# Patient Record
Sex: Female | Born: 1945 | Race: White | Hispanic: No | Smoking: Never smoker
Health system: Southern US, Community
[De-identification: ages and names within clinical notes are randomized; demographics above are authoritative.]

## PROBLEM LIST (undated history)

## (undated) DIAGNOSIS — I482 Chronic atrial fibrillation, unspecified: Secondary | ICD-10-CM

## (undated) DIAGNOSIS — J45909 Unspecified asthma, uncomplicated: Secondary | ICD-10-CM

## (undated) DIAGNOSIS — E139 Other specified diabetes mellitus without complications: Secondary | ICD-10-CM

## (undated) DIAGNOSIS — T7840XA Allergy, unspecified, initial encounter: Secondary | ICD-10-CM

## (undated) DIAGNOSIS — I2510 Atherosclerotic heart disease of native coronary artery without angina pectoris: Secondary | ICD-10-CM

## (undated) DIAGNOSIS — I5042 Chronic combined systolic (congestive) and diastolic (congestive) heart failure: Secondary | ICD-10-CM

## (undated) DIAGNOSIS — K5792 Diverticulitis of intestine, part unspecified, without perforation or abscess without bleeding: Secondary | ICD-10-CM

## (undated) DIAGNOSIS — I639 Cerebral infarction, unspecified: Secondary | ICD-10-CM

## (undated) DIAGNOSIS — E119 Type 2 diabetes mellitus without complications: Secondary | ICD-10-CM

## (undated) DIAGNOSIS — I10 Essential (primary) hypertension: Secondary | ICD-10-CM

## (undated) DIAGNOSIS — E7800 Pure hypercholesterolemia, unspecified: Secondary | ICD-10-CM

## (undated) DIAGNOSIS — I4891 Unspecified atrial fibrillation: Secondary | ICD-10-CM

## (undated) DIAGNOSIS — Z955 Presence of coronary angioplasty implant and graft: Secondary | ICD-10-CM

## (undated) HISTORY — DX: Cerebral infarction, unspecified: I63.9

## (undated) HISTORY — PX: CARDIAC CATHETERIZATION: SHX172

## (undated) HISTORY — DX: Unspecified atrial fibrillation: I48.91

## (undated) HISTORY — DX: Type 2 diabetes mellitus without complications: E11.9

## (undated) HISTORY — PX: CORONARY ANGIOPLASTY: SHX604

## (undated) HISTORY — DX: Chronic combined systolic (congestive) and diastolic (congestive) heart failure: I50.42

## (undated) HISTORY — DX: Essential (primary) hypertension: I10

## (undated) HISTORY — DX: Diverticulitis of intestine, part unspecified, without perforation or abscess without bleeding: K57.92

## (undated) HISTORY — DX: Allergy, unspecified, initial encounter: T78.40XA

## (undated) HISTORY — DX: Pure hypercholesterolemia, unspecified: E78.00

## (undated) HISTORY — PX: ABDOMINAL HYSTERECTOMY: SHX81

## (undated) HISTORY — PX: CORONARY ARTERY BYPASS GRAFT: SHX141

---

## 2013-03-22 DIAGNOSIS — Z8619 Personal history of other infectious and parasitic diseases: Secondary | ICD-10-CM | POA: Insufficient documentation

## 2014-08-04 ENCOUNTER — Encounter: Payer: Self-pay | Admitting: Podiatry

## 2014-08-04 ENCOUNTER — Ambulatory Visit (INDEPENDENT_AMBULATORY_CARE_PROVIDER_SITE_OTHER): Payer: Medicare Other

## 2014-08-04 ENCOUNTER — Ambulatory Visit (INDEPENDENT_AMBULATORY_CARE_PROVIDER_SITE_OTHER): Payer: Medicare Other | Admitting: Podiatry

## 2014-08-04 VITALS — BP 188/91 | HR 73 | Resp 14 | Ht 62.0 in | Wt 168.0 lb

## 2014-08-04 DIAGNOSIS — M6789 Other specified disorders of synovium and tendon, multiple sites: Secondary | ICD-10-CM

## 2014-08-04 DIAGNOSIS — M79672 Pain in left foot: Secondary | ICD-10-CM

## 2014-08-04 DIAGNOSIS — M76829 Posterior tibial tendinitis, unspecified leg: Secondary | ICD-10-CM

## 2014-08-04 DIAGNOSIS — M76822 Posterior tibial tendinitis, left leg: Secondary | ICD-10-CM

## 2014-08-04 NOTE — Patient Instructions (Signed)
Posterior Tibial Tendon Tendinitis with Rehab Tendonitis is a condition that is characterized by inflammation of a tendon or the lining (sheath) that surrounds it. The inflammation is usually caused by damage to the tendon, such as a tendon tear (strain). Sprains are classified into three categories. Grade 1 sprains cause pain, but the tendon is not lengthened. Grade 2 sprains include a lengthened ligament due to the ligament being stretched or partially ruptured. With grade 2 sprains there is still function, although the function may be diminished. Grade 3 sprains are characterized by a complete tear of the tendon or muscle, and function is usually impaired. Posterior tibialis tendonitis is tendonitis of the posterior tibial tendon, which attaches muscles of the lower leg to the foot. The posterior tibial tendon is located in the back of the ankle and helps the body straighten (plantar flex) and rotate inward (medially rotate) the ankle. SYMPTOMS   Pain, tenderness, swelling, warmth, and/or redness over the back of the inner ankle at the posterior tibial tendon or the inner part of the mid-foot.  Pain that worsens with plantar flexion or medial rotation of the ankle.  A crackling sound (crepitation) when the tendon is moved or touched. CAUSES  Posterior tibial tendonitis occurs when damage to the posterior tibial tendon starts an inflammatory response. Common mechanisms of injury include:  Degenerative (occurs with aging) processes that weaken the tendon and make it more susceptible to injury.  Stress placed on the tendon from an increase in the intensity, frequency, or duration of training.  Direct trauma to the ankle.  Returning to activity before a previous ankle injury is allowed to heal. RISK INCREASES WITH:  Activities that involve repetitive and/or stressful plantar flexion (jumping, kicking, or running up/down hills).  Poor strength and flexibility.  Flat feet.  Previous injury  to the foot, ankle, or leg. PREVENTION   Warm up and stretch properly before activity.  Allow for adequate recovery between workouts.  Maintain physical fitness:  Strength, flexibility, and endurance.  Cardiovascular fitness.  Learn and use proper technique. When possible, have a coach correct improper technique.  Complete rehabilitation from a previous foot, ankle, or leg injury.  If you have flat feet, wear arch supports (orthotics). PROGNOSIS  If treated properly, the symptoms of tendonitis usually resolve within 6 weeks. This period may be shorter for injuries caused by direct trauma. RELATED COMPLICATIONS   Prolonged healing time, if improperly treated or reinjured.  Recurrent symptoms that result in a chronic problem.  Partial or complete tendon tear (rupture) requiring surgery. TREATMENT  Treatment initially involves the use of ice and medication to help reduce pain and inflammation. The use of strengthening and stretching exercises may help reduce pain with activity. These exercises may be performed at home or with referral to a therapist. Often times, your caregiver will recommend immobilizing the ankle to allow the tendon to heal. If you have flat feet, you may be advised to wear orthotic arch supports. If symptoms persist for greater than 6 months despite nonsurgical (conservative) treatment, then surgery may be recommended. MEDICATION   If pain medication is necessary, then nonsteroidal anti-inflammatory medications, such as aspirin and ibuprofen, or other minor pain relievers, such as acetaminophen, are often recommended.  Do not take pain medication for 7 days before surgery.  Prescription pain relievers may be given if deemed necessary by your caregiver. Use only as directed and only as much as you need.  Corticosteroid injections may be given by your caregiver. These injections should  be reserved for the most serious cases because they may only be given a certain  number of times. HEAT AND COLD  Cold treatment (icing) relieves pain and reduces inflammation. Cold treatment should be applied for 10 to 15 minutes every 2 to 3 hours for inflammation and pain and immediately after any activity that aggravates your symptoms. Use ice packs or massage the area with a piece of ice (ice massage).  Heat treatment may be used prior to performing the stretching and strengthening activities prescribed by your caregiver, physical therapist, or athletic trainer. Use a heat pack or soak the injury in warm water. SEEK MEDICAL CARE IF:  Treatment seems to offer no benefit, or the condition worsens.  Any medications produce adverse side effects. EXERCISES RANGE OF MOTION (ROM) AND STRETCHING EXERCISES - Posterior Tibial Tendon Tendinitis These exercises may help you when beginning to rehabilitate your injury. Your symptoms may resolve with or without further involvement from your physician, physical therapist or athletic trainer. While completing these exercises, remember:   Restoring tissue flexibility helps normal motion to return to the joints. This allows healthier, less painful movement and activity.  An effective stretch should be held for at least 30 seconds.  A stretch should never be painful. You should only feel a gentle lengthening or release in the stretched tissue. RANGE OF MOTION - Ankle Plantar Flexion   Sit with your right / left leg crossed over your opposite knee.  Use your opposite hand to pull the top of your foot and toes toward you.  You should feel a gentle stretch on the top of your foot/ankle. Hold this position for __________ seconds. Repeat __________ times. Complete this exercise __________ times per day.  RANGE OF MOTION - Ankle Eversion   Sit with your right / left ankle crossed over your opposite knee.  Grip your foot with your opposite hand, placing your thumb on the top of your foot and your fingers across the bottom of your  foot.  Gently push your foot downward with a slight rotation so your littlest toes rise slightly.  You should feel a gentle stretch on the inside of your ankle. Hold the stretch for __________ seconds. Repeat __________ times. Complete this exercise __________ times per day.  RANGE OF MOTION - Ankle Inversion   Sit with your right / left ankle crossed over your opposite knee.  Grip your foot with your opposite hand, placing your thumb on the bottom of your foot and your fingers across the top of your foot.  Gently pull your foot so the smallest toe comes toward you and your thumb pushes the inside of the ball of your foot away from you.  You should feel a gentle stretch on the outside of your ankle. Hold the stretch for __________ seconds. Repeat __________ times. Complete this exercise __________ times per day.  RANGE OF MOTION - Dorsi/Plantar Flexion  While sitting with your right / left knee straight, draw the top of your foot upward by flexing your ankle. Then reverse the motion, pointing your toes downward.  Hold each position for __________ seconds.  After completing your first set of exercises, repeat this exercise with your knee bent. Repeat __________ times. Complete this exercise __________ times per day.  RANGE OF MOTION - Ankle Alphabet  Imagine your right / left big toe is a pen.  Keeping your hip and knee still, write out the entire alphabet with your "pen." Make the letters as large as you can without   increasing any discomfort. Repeat __________ times. Complete this exercise __________ times per day.  STRETCH - Gastrocsoleus   Sit with your right / left leg extended. Holding onto both ends of a belt or towel, loop it around the ball of your foot.  Keeping your right / left ankle and foot relaxed and your knee straight, pull your foot and ankle toward you using the belt/towel.  You should feel a gentle stretch behind your calf or knee. Hold this position for  __________ seconds. Repeat __________ times. Complete this exercise __________ times per day.  STRETCH - Gastroc, Standing   Place hands on wall.  Extend right / left leg, keeping the front knee somewhat bent.  Slightly point your toes inward on your back foot.  Keeping your right / left heel on the floor and your knee straight, shift your weight toward the wall, not allowing your back to arch.  You should feel a gentle stretch in the right / left calf. Hold this position for __________ seconds. Repeat __________ times. Complete this stretch __________ times per day. STRETCH - Soleus, Standing   Place hands on wall.  Extend right / left leg, keeping the other knee somewhat bent.  Slightly point your toes inward on your back foot.  Keep your right / left heel on the floor, bend your back knee, and slightly shift your weight over the back leg so that you feel a gentle stretch deep in your back calf.  Hold this position for __________ seconds. Repeat __________ times. Complete this stretch __________ times per day. STRENGTHENING EXERCISES - Posterior Tibial Tendon Tendinitis These exercises may help you when beginning to rehabilitate your injury. They may resolve your symptoms with or without further involvement from your physician, physical therapist, or athletic trainer. While completing these exercises, remember:   Muscles can gain both the endurance and the strength needed for everyday activities through controlled exercises.  Complete these exercises as instructed by your physician, physical therapist, or athletic trainer. Progress the resistance and repetitions only as guided. STRENGTH - Dorsiflexors  Secure a rubber exercise band/tubing to a fixed object (i.e., table, pole) and loop the other end around your right / left foot.  Sit on the floor facing the fixed object. The band/tubing should be slightly tense when your foot is relaxed.  Slowly draw your foot back toward you  using your ankle and toes.  Hold this position for __________ seconds. Slowly release the tension in the band and return your foot to the starting position. Repeat __________ times. Complete this exercise __________ times per day.  STRENGTH - Towel Curls  Sit in a chair positioned on a non-carpeted surface.  Place your foot on a towel, keeping your heel on the floor.  Pull the towel toward your heel by only curling your toes. Keep your heel on the floor.  If instructed by your physician, physical therapist, or athletic trainer, add ____________________ at the end of the towel. Repeat __________ times. Complete this exercise __________ times per day. STRENGTH - Ankle Eversion   Secure one end of a rubber exercise band/tubing to a fixed object (table, pole). Loop the other end around your foot just before your toes.  Place your fists between your knees. This will focus your strengthening at your ankle.  Drawing the band/tubing across your opposite foot, slowly pull your little toe out and up. Make sure the band/tubing is positioned to resist the entire motion.  Hold this position for __________ seconds.  Have   your muscles resist the band/tubing as it slowly pulls your foot back to the starting position. Repeat __________ times. Complete this exercise __________ times per day.  STRENGTH - Ankle Inversion   Secure one end of a rubber exercise band/tubing to a fixed object (table, pole). Loop the other end around your foot just before your toes.  Place your fists between your knees. This will focus your strengthening at your ankle.  Slowly, pull your big toe up and in, making sure the band/tubing is positioned to resist the entire motion.  Hold this position for __________ seconds.  Have your muscles resist the band/tubing as it slowly pulls your foot back to the starting position. Repeat __________ times. Complete this exercises __________ times per day.  Document Released:  09/24/2005 Document Revised: 02/08/2014 Document Reviewed: 01/06/2009 [employer] Patient Information 2015 [employer]. This information is not intended to replace advice given to you by your health care provider. Make sure you discuss any questions you have with your health care provider.  

## 2014-08-04 NOTE — Progress Notes (Signed)
   Subjective:    Patient ID: Danielle Payne, female    DOB: 03-19-1946, 68 y.o.   MRN: [id number]  HPI Comments: Patient presents the office today with complaints of left ankle pain. She points to the medial aspect of her ankle. She states this area has been painful for approximately 3 months began while playing tennis. She previously has been giving Medrol Dosepak and using an over-the-counter ankle brace. She states that wearing the ankle brace in a high top tennis shoe helps alleviate some of her symptoms. She denies any specific injury or trauma to the area. Pain is worse after prolonged periods of activity or ambulation. No other complaints at this time.   Foot Injury       Review of Systems  All other systems reviewed and are negative.      Objective:   Physical Exam AAO x3, NAD DP/PT pulses palpable bilaterally, CRT less than 3 seconds Protective sensation intact with Simms Weinstein monofilament, vibratory sensation intact, Achilles tendon reflex intact Tenderness to palpation over the posterior tibial tendon behind the medial malleolus and along its course into the insertion into the navicular. There is a decrease in medial arch height upon weightbearing. Able to perform double heel rise although discomfort to the left side. Patient is unable to perform a single heel rise. There is mild edema over the medial aspect of the ankle. There is no pinpoint any tenderness or pain with vibratory sensation over the tibia/fibula or other areas of the foot. There is no overlying erythema or increased warmth. MMT 4/5, ROM WNL  No pain with range of motion of the ankle joint or subtalar joint..  No calf pain with compression, swelling, warmth, erythema No open lesions or pre-ulcerative lesions.       Assessment & Plan:  68 year old female with left foot posterior tibial tendon dysfunction/tendinitis. -X-rays were obtained and reviewed with the patient. -Conservative versus surgical  treatment discussed including alternatives, risks, complications. -This time recommend custom molded orthotics to help support her foot type. A prescription was given for the patient to go to [employer]otech to have them made. Also discussed supportive shoe gear. -Discussed the use of an ankle brace as needed. -Stretching/strengthening exercises discussed with the patient. -Ice to the affected area. -If symptoms persist we'll get a MRI. -Follow-up in one month or sooner if any problems are to arise or any change in symptoms. In the meantime call the office with any questions, concerns, change in symptoms.

## 2014-09-08 ENCOUNTER — Ambulatory Visit: Payer: Medicare Other | Admitting: Podiatry

## 2014-11-23 ENCOUNTER — Encounter (HOSPITAL_COMMUNITY): Payer: Self-pay

## 2014-11-23 ENCOUNTER — Observation Stay (HOSPITAL_COMMUNITY)
Admission: EM | Admit: 2014-11-23 | Discharge: 2014-11-23 | Disposition: A | Discharge status: Home / Self Care | Payer: Medicare Other | Attending: Internal Medicine | Admitting: Internal Medicine

## 2014-11-23 ENCOUNTER — Emergency Department (HOSPITAL_COMMUNITY): Payer: Medicare Other

## 2014-11-23 DIAGNOSIS — E785 Hyperlipidemia, unspecified: Secondary | ICD-10-CM

## 2014-11-23 DIAGNOSIS — I481 Persistent atrial fibrillation: Principal | ICD-10-CM | POA: Insufficient documentation

## 2014-11-23 DIAGNOSIS — Z7982 Long term (current) use of aspirin: Secondary | ICD-10-CM | POA: Diagnosis not present

## 2014-11-23 DIAGNOSIS — R002 Palpitations: Secondary | ICD-10-CM

## 2014-11-23 DIAGNOSIS — I4819 Other persistent atrial fibrillation: Secondary | ICD-10-CM

## 2014-11-23 DIAGNOSIS — M546 Pain in thoracic spine: Secondary | ICD-10-CM

## 2014-11-23 DIAGNOSIS — I2510 Atherosclerotic heart disease of native coronary artery without angina pectoris: Secondary | ICD-10-CM

## 2014-11-23 DIAGNOSIS — I4891 Unspecified atrial fibrillation: Secondary | ICD-10-CM | POA: Diagnosis present

## 2014-11-23 DIAGNOSIS — Z79899 Other long term (current) drug therapy: Secondary | ICD-10-CM | POA: Diagnosis not present

## 2014-11-23 DIAGNOSIS — I10 Essential (primary) hypertension: Secondary | ICD-10-CM | POA: Insufficient documentation

## 2014-11-23 DIAGNOSIS — M549 Dorsalgia, unspecified: Secondary | ICD-10-CM | POA: Insufficient documentation

## 2014-11-23 DIAGNOSIS — Z955 Presence of coronary angioplasty implant and graft: Secondary | ICD-10-CM | POA: Diagnosis not present

## 2014-11-23 DIAGNOSIS — I482 Chronic atrial fibrillation: Secondary | ICD-10-CM

## 2014-11-23 DIAGNOSIS — I252 Old myocardial infarction: Secondary | ICD-10-CM | POA: Diagnosis not present

## 2014-11-23 DIAGNOSIS — R079 Chest pain, unspecified: Secondary | ICD-10-CM

## 2014-11-23 DIAGNOSIS — J45909 Unspecified asthma, uncomplicated: Secondary | ICD-10-CM | POA: Diagnosis present

## 2014-11-23 HISTORY — DX: Presence of coronary angioplasty implant and graft: Z95.5

## 2014-11-23 HISTORY — DX: Unspecified asthma, uncomplicated: J45.909

## 2014-11-23 HISTORY — DX: Atherosclerotic heart disease of native coronary artery without angina pectoris: I25.10

## 2014-11-23 LAB — CBC WITH DIFFERENTIAL/PLATELET
Basophils Absolute: 0.1 10*3/uL (ref 0.0–0.1)
Basophils Absolute: 0.1 10*3/uL (ref 0.0–0.1)
Basophils Relative: 1 % (ref 0–1)
Basophils Relative: 1 % (ref 0–1)
EOS ABS: 0.3 10*3/uL (ref 0.0–0.7)
Eosinophils Absolute: 0.3 10*3/uL (ref 0.0–0.7)
Eosinophils Relative: 3 % (ref 0–5)
Eosinophils Relative: 3 % (ref 0–5)
HCT: 41.7 % (ref 36.0–46.0)
HCT: 41 % (ref 36.0–46.0)
HEMOGLOBIN: 13.7 g/dL (ref 12.0–15.0)
HEMOGLOBIN: 13.5 g/dL (ref 12.0–15.0)
LYMPHS ABS: 4.1 10*3/uL — AB (ref 0.7–4.0)
LYMPHS ABS: 3.8 10*3/uL (ref 0.7–4.0)
LYMPHS PCT: 42 % (ref 12–46)
Lymphocytes Relative: 45 % (ref 12–46)
MCH: 29.3 pg (ref 26.0–34.0)
MCH: 29.3 pg (ref 26.0–34.0)
MCHC: 32.9 g/dL (ref 30.0–36.0)
MCHC: 32.9 g/dL (ref 30.0–36.0)
MCV: 89.1 fL (ref 78.0–100.0)
MCV: 89.1 fL (ref 78.0–100.0)
MONO ABS: 0.7 10*3/uL (ref 0.1–1.0)
Monocytes Absolute: 0.7 10*3/uL (ref 0.1–1.0)
Monocytes Relative: 7 % (ref 3–12)
Monocytes Relative: 8 % (ref 3–12)
NEUTROS ABS: 4.1 10*3/uL (ref 1.7–7.7)
NEUTROS PCT: 44 % (ref 43–77)
Neutro Abs: 4.2 10*3/uL (ref 1.7–7.7)
Neutrophils Relative %: 46 % (ref 43–77)
PLATELETS: 260 10*3/uL (ref 150–400)
Platelets: 273 10*3/uL (ref 150–400)
RBC: 4.68 MIL/uL (ref 3.87–5.11)
RBC: 4.6 MIL/uL (ref 3.87–5.11)
RDW: 13.5 % (ref 11.5–15.5)
RDW: 13.5 % (ref 11.5–15.5)
WBC: 9.2 10*3/uL (ref 4.0–10.5)
WBC: 9.1 10*3/uL (ref 4.0–10.5)

## 2014-11-23 LAB — I-STAT CHEM 8, ED
BUN: 21 mg/dL (ref 6–23)
CHLORIDE: 107 mmol/L (ref 96–112)
Calcium, Ion: 1.15 mmol/L (ref 1.13–1.30)
Creatinine, Ser: 0.8 mg/dL (ref 0.50–1.10)
GLUCOSE: 122 mg/dL — AB (ref 70–99)
HCT: 43 % (ref 36.0–46.0)
Hemoglobin: 14.6 g/dL (ref 12.0–15.0)
Potassium: 4 mmol/L (ref 3.5–5.1)
Sodium: 141 mmol/L (ref 135–145)
TCO2: 19 mmol/L (ref 0–100)

## 2014-11-23 LAB — I-STAT TROPONIN, ED: TROPONIN I, POC: 0 ng/mL (ref 0.00–0.08)

## 2014-11-23 LAB — COMPREHENSIVE METABOLIC PANEL
ALK PHOS: 71 U/L (ref 39–117)
ALT: 21 U/L (ref 0–35)
AST: 19 U/L (ref 0–37)
Albumin: 4 g/dL (ref 3.5–5.2)
Anion gap: 7 (ref 5–15)
BILIRUBIN TOTAL: 0.5 mg/dL (ref 0.3–1.2)
BUN: 18 mg/dL (ref 6–23)
CO2: 24 mmol/L (ref 19–32)
Calcium: 9.1 mg/dL (ref 8.4–10.5)
Chloride: 108 mmol/L (ref 96–112)
Creatinine, Ser: 0.8 mg/dL (ref 0.50–1.10)
GFR calc non Af Amer: 73 mL/min — ABNORMAL LOW (ref >90)
GFR, EST AFRICAN AMERICAN: 85 mL/min — AB (ref >90)
GLUCOSE: 120 mg/dL — AB (ref 70–99)
POTASSIUM: 4.1 mmol/L (ref 3.5–5.1)
SODIUM: 139 mmol/L (ref 135–145)
Total Protein: 6.8 g/dL (ref 6.0–8.3)

## 2014-11-23 LAB — TROPONIN I: Troponin I: <0.03 ng/mL (ref <0.031)

## 2014-11-23 LAB — T4, FREE: Free T4: 0.98 ng/dL (ref 0.80–1.80)

## 2014-11-23 LAB — GLUCOSE, CAPILLARY: Glucose-Capillary: 132 mg/dL — ABNORMAL HIGH (ref 70–99)

## 2014-11-23 LAB — MAGNESIUM: Magnesium: 2.2 mg/dL (ref 1.5–2.5)

## 2014-11-23 LAB — TSH: TSH: 0.792 u[IU]/mL (ref 0.350–4.500)

## 2014-11-23 MED ORDER — LEVALBUTEROL HCL 0.63 MG/3ML IN NEBU: 0.6300 mg | INHALATION_SOLUTION | Freq: Four times a day (QID) | RESPIRATORY_TRACT | Status: DC | PRN

## 2014-11-23 MED ORDER — SODIUM CHLORIDE 0.9 % IV BOLUS (SEPSIS)
1000.0000 mL | Freq: Once | INTRAVENOUS | Status: AC
  Administered 2014-11-23: 1000 mL via INTRAVENOUS

## 2014-11-23 MED ORDER — RIVAROXABAN 20 MG PO TABS: 20.0000 mg | ORAL_TABLET | Freq: Every day | ORAL | Status: DC

## 2014-11-23 MED ORDER — OMEGA-3-ACID ETHYL ESTERS 1 G PO CAPS
1.0000 g | ORAL_CAPSULE | Freq: Every day | ORAL | Status: DC
Start: 2014-11-24 — End: 2014-11-23

## 2014-11-23 MED ORDER — AMLODIPINE BESYLATE 10 MG PO TABS: 10.0000 mg | ORAL_TABLET | Freq: Every day | ORAL | Status: DC

## 2014-11-23 MED ORDER — HEPARIN BOLUS VIA INFUSION
2000.0000 [IU] | INTRAVENOUS | Status: AC
  Administered 2014-11-23: 2000 [IU] via INTRAVENOUS
  Filled 2014-11-23: qty 2000

## 2014-11-23 MED ORDER — SODIUM CHLORIDE 0.9 % IJ SOLN: 3.0000 mL | Freq: Two times a day (BID) | INTRAMUSCULAR | Status: DC

## 2014-11-23 MED ORDER — RIVAROXABAN 20 MG PO TABS
20.0000 mg | ORAL_TABLET | Freq: Every day | ORAL | Status: DC
  Administered 2014-11-23: 20 mg via ORAL
  Filled 2014-11-23: qty 1

## 2014-11-23 MED ORDER — ACETAMINOPHEN 325 MG PO TABS: 650.0000 mg | ORAL_TABLET | ORAL | Status: DC | PRN

## 2014-11-23 MED ORDER — ASPIRIN 325 MG PO TABS: 325.0000 mg | ORAL_TABLET | Freq: Every day | ORAL | Status: DC

## 2014-11-23 MED ORDER — HEPARIN (PORCINE) IN NACL 100-0.45 UNIT/ML-% IJ SOLN
1100.0000 [IU]/h | INTRAMUSCULAR | Status: DC
  Administered 2014-11-23: 1100 [IU]/h via INTRAVENOUS
  Filled 2014-11-23: qty 250

## 2014-11-23 MED ORDER — FOLIC ACID 1 MG PO TABS: 1.0000 mg | ORAL_TABLET | Freq: Every day | ORAL | Status: DC

## 2014-11-23 MED ORDER — ONDANSETRON HCL 4 MG/2ML IJ SOLN: 4.0000 mg | Freq: Four times a day (QID) | INTRAMUSCULAR | Status: DC | PRN

## 2014-11-23 MED ORDER — ASPIRIN 81 MG PO CHEW
81.0000 mg | CHEWABLE_TABLET | Freq: Every day | ORAL | Status: DC
  Administered 2014-11-23: 81 mg via ORAL
  Filled 2014-11-23: qty 1

## 2014-11-23 MED ORDER — LISINOPRIL 40 MG PO TABS
40.0000 mg | ORAL_TABLET | Freq: Every evening | ORAL | Status: DC
  Filled 2014-11-23: qty 1

## 2014-11-23 MED ORDER — RIVAROXABAN (XARELTO) VTE STARTER PACK (15 & 20 MG): ORAL_TABLET | ORAL | Status: DC

## 2014-11-23 MED ORDER — METOPROLOL TARTRATE 25 MG PO TABS
25.0000 mg | ORAL_TABLET | Freq: Two times a day (BID) | ORAL | Status: DC
  Administered 2014-11-23: 25 mg via ORAL
  Filled 2014-11-23: qty 1

## 2014-11-23 MED ORDER — LEVALBUTEROL TARTRATE 45 MCG/ACT IN AERO: 2.0000 | INHALATION_SPRAY | Freq: Four times a day (QID) | RESPIRATORY_TRACT | Status: DC | PRN

## 2014-11-23 NOTE — ED Notes (Signed)
Hospitalist at bedside. Pt ready for transport.

## 2014-11-23 NOTE — ED Notes (Signed)
Repeat EKG per EDP [facility]

## 2014-11-23 NOTE — Progress Notes (Signed)
ANTICOAGULATION CONSULT NOTE - Initial Consult  Pharmacy Consult for Heparin Indication: Atrial Fibrillation  No Known Allergies  Patient Measurements: Height: 5\' 3"  (160 cm) Weight: 178 lb 5.6 oz (80.9 kg) IBW/kg (Calculated) : 52.4   Vital Signs: Temp: 97.6 F (36.4 C) (02/16 0617) Temp Source: Oral (02/16 0617) BP: 135/79 mmHg (02/16 0617) Pulse Rate: 72 (02/16 0617)  Labs:  Recent Labs  11/23/14 0500 11/23/14 0512  HGB 13.7 14.6  HCT 41.7 43.0  PLT 273  --   CREATININE  --  0.80    Estimated Creatinine Clearance: 65.9 mL/min (by C-G formula based on Cr of 0.8).   Medical History: Past Medical History  Diagnosis Date  . Hypertension   . Allergy   . Stented coronary artery   . Coronary artery disease   . Asthma     Assessment: 69 y/o F presented to ED with worsening chest pressure and palpitations, was found to be in atrial fibrillation with controlled ventricular rate.  EKG shows ST depression in the inferior leads but initial troponin is negative.  Orders received to begin IV heparin with pharmacy dosing assistance. Baseline PT and aPTT are pending.  Patient on ASA prior to admission for CAD and stent but not on any anticoagulants.   Goal of Therapy:  Heparin level 0.3-0.7 units/ml Monitor platelets by anticoagulation protocol: Yes   Plan: . 1. Heparin 2000 units IV bolus x 1 2. Heparin 1100 units/hr IV infusion 3. Heparin level today at 2pm 4. Heparin level and CBC daily 5. Follow clinical course, monitor for reports of bleeding.   Clayburn Pert, PharmD, BCPS Pager: [phone] 11/23/2014  6:50 AM

## 2014-11-23 NOTE — ED Provider Notes (Signed)
CSN: [id number]     Arrival date & time 11/23/14  0434 History   First MD Initiated Contact with Patient 11/23/14 [phone]     Chief Complaint  Patient presents with  . Irregular Heart Beat     (Consider location/radiation/quality/duration/timing/severity/associated sxs/prior Treatment) HPI  Danielle Payne is a 69 y.o. female with past medical history of hypertension, COPD, coronary artery disease status post stent placement here with chest pressure and palpitations. Patient states this all began the day of the [place] Bowl. She denies any alcohol use. Her symptoms progressively worsened until today around 4 AM the patient woke up with palpitations and chest pressure. She took amlodipine and metoprolol, her home medications without any relief. She also took a nitroglycerin which did not help. Patient normally takes at 325 mg aspirin per day. He does have a history of atrial fibrillation many years ago that was electrical cardioverted. Patient states she was short of breath as well. She denies emesis or diaphoresis. Patient is still currently having chest pressure in the room.  10 Systems reviewed and are negative for acute change except as noted in the HPI.    Past Medical History  Diagnosis Date  . Hypertension   . COPD (chronic obstructive pulmonary disease)   . Allergy   . Stented coronary artery    Past Surgical History  Procedure Laterality Date  . Abdominal hysterectomy     History reviewed. No pertinent family history. History  Substance Use Topics  . Smoking status: Never Smoker   . Smokeless tobacco: Not on file  . Alcohol Use: No   OB History    No data available     Review of Systems    Allergies  Review of patient's allergies indicates no known allergies.  Home Medications   Prior to Admission medications   Medication Sig Start Date End Date Taking? Authorizing Provider  albuterol (PROVENTIL HFA;VENTOLIN HFA) 108 (90 BASE) MCG/ACT inhaler Inhale 2 puffs into  the lungs every 4 (four) hours as needed for wheezing or shortness of breath.   Yes Historical Provider, MD  amLODipine (NORVASC) 10 MG tablet Take 10 mg by mouth daily.   Yes Historical Provider, MD  aspirin 325 MG tablet Take 325 mg by mouth at bedtime.    Yes Historical Provider, MD  folic acid (FOLVITE) 1 MG tablet Take 1 mg by mouth at bedtime.   Yes Historical Provider, MD  lisinopril (PRINIVIL,ZESTRIL) 40 MG tablet Take 40 mg by mouth every evening.    Yes Historical Provider, MD  metoprolol tartrate (LOPRESSOR) 25 MG tablet Take 25 mg by mouth 2 (two) times daily.   Yes Historical Provider, MD  Omega-3 Fatty Acids (FISH OIL PO) Take 1 capsule by mouth daily.   Yes Historical Provider, MD  OVER THE COUNTER MEDICATION Take 1 capsule by mouth at bedtime. Sleep Essentials   Yes Historical Provider, MD  OVER THE COUNTER MEDICATION Take 5 mLs by mouth at bedtime. Valerian Root Liquid   Yes Historical Provider, MD   BP 150/80 mmHg  Pulse 77  Resp 14  SpO2 97% Physical Exam  Constitutional: She is oriented to person, place, and time. She appears well-developed and well-nourished. No distress.  HENT:  Head: Normocephalic and atraumatic.  Nose: Nose normal.  Mouth/Throat: Oropharynx is clear and moist. No oropharyngeal exudate.  Eyes: Conjunctivae and EOM are normal. Pupils are equal, round, and reactive to light. No scleral icterus.  Neck: Normal range of motion. Neck supple. No JVD present. No  tracheal deviation present. No thyromegaly present.  Cardiovascular: Normal rate and normal heart sounds.  Exam reveals no gallop and no friction rub.   No murmur heard. Irregularly irregular rhythm  Pulmonary/Chest: Effort normal and breath sounds normal. No respiratory distress. She has no wheezes. She exhibits no tenderness.  Abdominal: Soft. Bowel sounds are normal. She exhibits no distension and no mass. There is no tenderness. There is no rebound and no guarding.  Musculoskeletal: Normal range  of motion. She exhibits no edema or tenderness.  Lymphadenopathy:    She has no cervical adenopathy.  Neurological: She is alert and oriented to person, place, and time. No cranial nerve deficit. She exhibits normal muscle tone.  Skin: Skin is warm and dry. No rash noted. She is not diaphoretic. No erythema. No pallor.  Nursing note and vitals reviewed.   ED Course  Procedures (including critical care time) Labs Review Labs Reviewed  CBC WITH DIFFERENTIAL/PLATELET - Abnormal; Notable for the following:    Lymphs Abs 4.1 (*)    All other components within normal limits  I-STAT CHEM 8, ED - Abnormal; Notable for the following:    Glucose, Bld 122 (*)    All other components within normal limits  MAGNESIUM  I-STAT TROPOININ, ED    Imaging Review Dg Chest 2 View  11/23/2014   CLINICAL DATA:  Irregular heartbeat, several days duration. Left-sided chest discomfort.  EXAM: CHEST  2 VIEW  COMPARISON:  03/20/2011  FINDINGS: The lungs are clear. There are no effusions. Pulmonary vasculature is normal. Hilar, mediastinal and cardiac contours appear unremarkable.No significant skeletal abnormalities are evident.  IMPRESSION: No active cardiopulmonary disease.   Electronically Signed   By: Andreas Newport M.D.   On: 11/23/2014 05:24     EKG Interpretation   Date/Time:  Tuesday November 23 2014 05:02:16 EST Ventricular Rate:  65 PR Interval:    QRS Duration: 78 QT Interval:  382 QTC Calculation: 397 R Axis:   18 Text Interpretation:  Atrial fibrillation No significant change since last  tracing Confirmed by Glynn Octave [phone]) on 11/23/2014 5:06:08 AM      MDM   Final diagnoses:  Chest pain    Patient presents emergency department for chest pain and palpitations. EKG confirms atrial fibrillation. She has ST depressions in the inferior leads as well concerning for ischemia. Troponin is negative. Patient was admitted to telemetry for continued management. It appears her  onset was during the [place] Bowl that she is out of the window for cardioversion as is for greater than 2 days. CHADS2-VASC score is 3.  . Patient was admitted to [facility] hospitalist telemetry unit.    Everlene Balls, MD 11/23/14 [phone]

## 2014-11-23 NOTE — Progress Notes (Addendum)
ANTICOAGULATION CONSULT NOTE - follow-up  Pharmacy Consult for Heparin to rivaroxaban Indication: Atrial Fibrillation  No Known Allergies  Patient Measurements: Height: 5\' 3"  (160 cm) Weight: 178 lb 5.6 oz (80.9 kg) IBW/kg (Calculated) : 52.4   Vital Signs: Temp: 97.6 F (36.4 C) (02/16 0617) Temp Source: Oral (02/16 0617) BP: 135/79 mmHg (02/16 0617) Pulse Rate: 72 (02/16 0617)  Labs:  Recent Labs  11/23/14 0500 11/23/14 0512 11/23/14 0650  HGB 13.7 14.6 13.5  HCT 41.7 43.0 41.0  PLT 273  --  260  CREATININE  --  0.80 0.80  TROPONINI  --   --  <0.03    Estimated Creatinine Clearance: 65.9 mL/min (by C-G formula based on Cr of 0.8).   Medical History: Past Medical History  Diagnosis Date  . Hypertension   . Allergy   . Stented coronary artery   . Coronary artery disease   . Asthma     Assessment: 69 y/o F presented to ED with worsening chest pressure and palpitations, was found to be in atrial fibrillation with controlled ventricular rate.  EKG shows ST depression in the inferior leads but initial troponin is negative.  Orders received to begin IV heparin with pharmacy dosing assistance. Cardiology saw patient 2/16 and orders to change to rivaroxaban. CHA2DS2-VASCScore: Risk Score =  4, AC Has Bled score = 2   Baseline PT and aPTT ordered STAT 2/16 but never collected.  Patient on ASA prior to admission for CAD and stent but not on any anticoagulants.   Goal of Therapy:  Dose rivaroxaban for indication and renal function Monitor platelets by anticoagulation protocol: Yes   Plan: .  Change heparin to rivaroxaban for afib  D/C heparin labs  Start rivaroxaban 20mg  daily w/ food, give 1st dose at noon and d/c heparin gtt simultaneously  Suggest reduce ASA to 81mg  daily - cardiology note states reduce dose but not ordered  Doreene Eland, PharmD, BCPS.   Pager: [phone] 11/23/2014  10:30 AM  Addendum:  Educated patient on medication and signs of  stroke.  Card provided for a free 30-day supply of medication and to call [phone] if further medication assistance is needed. She stated she does not have medication coverage.   Doreene Eland, PharmD, BCPS.   Pager: [phone] 11/23/2014 2:30 PM

## 2014-11-23 NOTE — H&P (Signed)
[facility] History and Physical  Turner Kunzman ZOX:[id number] DOB: 1946-02-26 DOA: 11/23/2014  Referring physician: ER physician. PCP: No PCP Per Patient   Chief Complaint: Palpitation and left upper back pain.  HPI: Danielle Payne is a 69 y.o. female history of hypertension, asthma, atrial fibrillation, CAD status post stenting presents to the ER because of palpitations and left upper back pain. Patient states that patient woke up in the middle of the night because of palpitations and patient took her metoprolol and nitroglycerin. Patient called EMS and patient came to the ER by the patient's palpitation has resolved but he usually atrial fibrillation with controlled rate. Patient states she has had atrial fibrillation 3 years ago and at that time had cardioversion done and has been placed on aspirin and was not on any anticoagulants. In addition patient has been having some left upper back pain for the last 2 days. Patient states she also has been using the computer more than usual. In the EKG was showing some subtle inferior leads depression. Cardiac markers remain negative patient's left upper back pain has been constant and has no relation to exertion or breathing and that has been no associated productive cough fever chills or any dysuria discharges. On exam patient is not in distress and is no left flank tenderness. Patient's left upper back pain is not appreciable on palpation.     Review of Systems: As presented in the history of presenting illness, rest negative.  Past Medical History  Diagnosis Date  . Hypertension   . Allergy   . Stented coronary artery   . Coronary artery disease   . Asthma    Past Surgical History  Procedure Laterality Date  . Abdominal hysterectomy    . Cardiac catheterization    . Coronary angioplasty     Social History:  reports that she has never smoked. She does not have any smokeless tobacco history on file. She reports that she does not  drink alcohol. Her drug history is not on file. Where does patient live home.  Can patient participate in ADLs? Yes.  No Known Allergies  Family History:  Family History  Problem Relation Age of Onset  . CAD Mother   . Prostate cancer Father   . Diabetes Mellitus II Sister   . Diabetes Mellitus II Brother       Prior to Admission medications   Medication Sig Start Date End Date Taking? Authorizing Provider  albuterol (PROVENTIL HFA;VENTOLIN HFA) 108 (90 BASE) MCG/ACT inhaler Inhale 2 puffs into the lungs every 4 (four) hours as needed for wheezing or shortness of breath.   Yes Historical Provider, MD  amLODipine (NORVASC) 10 MG tablet Take 10 mg by mouth daily.   Yes Historical Provider, MD  aspirin 325 MG tablet Take 325 mg by mouth at bedtime.    Yes Historical Provider, MD  folic acid (FOLVITE) 1 MG tablet Take 1 mg by mouth at bedtime.   Yes Historical Provider, MD  lisinopril (PRINIVIL,ZESTRIL) 40 MG tablet Take 40 mg by mouth every evening.    Yes Historical Provider, MD  metoprolol tartrate (LOPRESSOR) 25 MG tablet Take 25 mg by mouth 2 (two) times daily.   Yes Historical Provider, MD  Omega-3 Fatty Acids (FISH OIL PO) Take 1 capsule by mouth daily.   Yes Historical Provider, MD  OVER THE COUNTER MEDICATION Take 1 capsule by mouth at bedtime. Sleep Essentials   Yes Historical Provider, MD  OVER THE COUNTER MEDICATION Take 5 mLs by mouth  at bedtime. Valerian Root Liquid   Yes Historical Provider, MD    Physical Exam: Filed Vitals:   11/23/14 0445 11/23/14 0617  BP: 150/80 135/79  Pulse: 77 72  Temp:  97.6 F (36.4 C)  TempSrc:  Oral  Resp: 14 14  Height:  5\' 3"  (1.6 m)  Weight:  80.9 kg (178 lb 5.6 oz)  SpO2: 97% 98%     General:  Well-developed and nourished.  Eyes: Anicteric no pallor.  ENT: No discharge from the ears eyes nose or mouth.  Neck: No mass felt.  Cardiovascular: S1 and S2 heard.  Respiratory: No rhonchi or crepitations.  Abdomen: Soft  nontender bowel sounds present.  Skin: No rash.  Musculoskeletal: No edema.  Psychiatric: Appears normal.  Neurologic: Alert awake oriented to time place and person. Moves all extremities.  Labs on Admission:  Basic Metabolic Panel:  Recent Labs Lab 11/23/14 0500 11/23/14 0512  NA  --  141  K  --  4.0  CL  --  107  GLUCOSE  --  122*  BUN  --  21  CREATININE  --  0.80  MG 2.2  --    Liver Function Tests: No results for input(s): AST, ALT, ALKPHOS, BILITOT, PROT, ALBUMIN in the last 168 hours. No results for input(s): LIPASE, AMYLASE in the last 168 hours. No results for input(s): AMMONIA in the last 168 hours. CBC:  Recent Labs Lab 11/23/14 0500 11/23/14 0512  WBC 9.2  --   NEUTROABS 4.1  --   HGB 13.7 14.6  HCT 41.7 43.0  MCV 89.1  --   PLT 273  --    Cardiac Enzymes: No results for input(s): CKTOTAL, CKMB, CKMBINDEX, TROPONINI in the last 168 hours.  BNP (last 3 results) No results for input(s): BNP in the last 8760 hours.  ProBNP (last 3 results) No results for input(s): PROBNP in the last 8760 hours.  CBG: No results for input(s): GLUCAP in the last 168 hours.  Radiological Exams on Admission: Dg Chest 2 View  11/23/2014   CLINICAL DATA:  Irregular heartbeat, several days duration. Left-sided chest discomfort.  EXAM: CHEST  2 VIEW  COMPARISON:  03/20/2011  FINDINGS: The lungs are clear. There are no effusions. Pulmonary vasculature is normal. Hilar, mediastinal and cardiac contours appear unremarkable.No significant skeletal abnormalities are evident.  IMPRESSION: No active cardiopulmonary disease.   Electronically Signed   By: Andreas Newport M.D.   On: 11/23/2014 05:24    EKG: Independently reviewed. Atrial fibrillation controlled rate with inferior leads chest and depression.  Assessment/Plan Principal Problem:   Palpitations Active Problems:   Atrial fibrillation   Upper back pain   Essential hypertension   Asthma, chronic   Persistent  atrial fibrillation   1. Palpitation with history of atrial fibrillation - presently rate controlled and patient is in A. fib. Patient's CHADS2VASC score is 4. Patient will be placed on heparin infusion for now and continue on her metoprolol. Check thyroid function test 2-D echo. Cycle cardiac markers. 2. Left upper back pain with history of CAD status post stenting - pain appears atypical but patient has some subtle EKG changes. We will cycle cardiac markers check 2-D echo. I have discussed with Dr. Einar Gip patient's cardiologist will be seeing patient in consult. Patient will be kept nothing by mouth except medications. Aspirin. Patient is already on metoprolol. 3. Hypertension - continue present home medications. 4. History of asthma - when necessary Xopenex. Presently not wheezing.   DVT Prophylaxis  on heparin infusion.  Code Status: Full code.  Family Communication: Patient's husband.  Disposition Plan: Admit to inpatient.    Millie Shorb N. [facility] Pager [phone].  If 7PM-7AM, please contact night-coverage [email or web address] Password [email or web address] 11/23/2014, 6:20 AM

## 2014-11-23 NOTE — Care Management Note (Addendum)
    Page 1 of 1   11/23/2014     3:17:27 PM CARE MANAGEMENT NOTE 11/23/2014  Patient:  Danielle Payne, Danielle Payne   Account Number:  [id number]  Date Initiated:  11/23/2014  Documentation initiated by:  Dessa Phi  Subjective/Objective Assessment:   69 y/o f admitted w/palpitations,afib.     Action/Plan:   From home.   Anticipated DC Date:  11/23/2014   Anticipated DC Plan:  [place]  CM consult  Medication Assistance      Choice offered to / List presented to:             Status of service:  Completed, signed off Medicare Important Message given?   (If response is "NO", the following Medicare IM given date fields will be blank) Date Medicare IM given:   Medicare IM given by:   Date Additional Medicare IM given:   Additional Medicare IM given by:    Discharge Disposition:  HOME/SELF CARE  Per UR Regulation:  Reviewed for med. necessity/level of care/duration of stay  If discussed at [place] of Stay Meetings, dates discussed:    Comments:  11/23/14 Dessa Phi RN BSN NCM [phone] spoke to MD who will put in [place] will provide education, kit has discount card enclosed. Patient does not have medicare part D script coverage. Other meds are on $4 [facility] list,& patient states she can afford meds. No MATCH program utilized.No further d/c needs. Monitor progress for d/c needs.No anticipated d/c needs.

## 2014-11-23 NOTE — Discharge Summary (Signed)
Physician Discharge Summary  Danielle Payne POE:[id number] DOB: 09-05-1946 DOA: 11/23/2014  PCP: No PCP Per Patient  Admit date: 11/23/2014 Discharge date: 11/23/2014  Time spent:50 minutes  Recommendations for Outpatient Follow-up:  1. F/u with Dr Einar Gip  Discharge Condition: stable Diet recommendation: heart healthy  Discharge Diagnoses:  Principal Problem:   Palpitations Active Problems:   Atrial fibrillation   Upper back pain   Essential hypertension   Asthma, chronic   Hyperlipidemia   CAD (coronary artery disease), native coronary artery   History of present illness:  Danielle Payne is a 69 y.o. female history of hypertension, asthma, atrial fibrillation, CAD status post stenting presents to the ER because of palpitations and left upper back pain. Patient states that patient woke up in the middle of the night because of palpitations and patient took her metoprolol and nitroglycerin. Patient called EMS and patient came to the ER by the patient's palpitation has resolved but he usually atrial fibrillation with controlled rate. Patient states she has had atrial fibrillation 3 years ago and at that time had cardioversion done and has been placed on aspirin and was not on any anticoagulants. In addition patient has been having some left upper back pain for the last 2 days. Patient states she also has been using the computer more than usual. In the EKG was showing some subtle inferior leads depression. Cardiac markers remain negative patient's left upper back pain has been constant and has no relation to exertion or breathing and that has been no associated productive cough fever chills or any dysuria discharges. On exam patient is not in distress and is no left flank tenderness. Patient's left upper back pain is not appreciable on palpation.   Hospital Course:  A-fib/ palpitations - the patient was admitted and started on a heparin infusion- she was seen by Dr Einar Gip today who  recommends starting Xarelto and then cardioverting in 3 wks. She is not able to afford the Xarelto and will get a one month supply from our pharmacy. - Dr Einar Gip recommends to continue Metoprolol for rate control for now.  H/o CAD with stent - cardiac enzymes negative- no further work up at this time  Procedures:  none  Consultations:  cardiology  Discharge Exam: Filed Weights   11/23/14 0617  Weight: 80.9 kg (178 lb 5.6 oz)   Filed Vitals:   11/23/14 1407  BP: 129/82  Pulse: 67  Temp: 98 F (36.7 C)  Resp: 15    General: AAO x 3, no distress Cardiovascular: IIRR, no murmurs  Respiratory: clear to auscultation bilaterally GI: soft, non-tender, non-distended, bowel sound positive  Discharge Instructions You were cared for by a hospitalist during your hospital stay. If you have any questions about your discharge medications or the care you received while you were in the hospital after you are discharged, you can call the unit and asked to speak with the hospitalist on call if the hospitalist that took care of you is not available. Once you are discharged, your primary care physician will handle any further medical issues. Please note that NO REFILLS for any discharge medications will be authorized once you are discharged, as it is imperative that you return to your primary care physician (or establish a relationship with a primary care physician if you do not have one) for your aftercare needs so that they can reassess your need for medications and monitor your lab values.      Discharge Instructions    Diet - low sodium  heart healthy    Complete by:  As directed      Increase activity slowly    Complete by:  As directed      rivaroxaban Alveda Reasons) per pharmacy consult    Complete by:  As directed   Please give patient started pack for A-fib            Medication List    STOP taking these medications        aspirin 325 MG tablet      TAKE these medications         albuterol 108 (90 BASE) MCG/ACT inhaler  Commonly known as:  PROVENTIL HFA;VENTOLIN HFA  Inhale 2 puffs into the lungs every 4 (four) hours as needed for wheezing or shortness of breath.     amLODipine 10 MG tablet  Commonly known as:  NORVASC  Take 10 mg by mouth daily.     FISH OIL PO  Take 1 capsule by mouth daily.     folic acid 1 MG tablet  Commonly known as:  FOLVITE  Take 1 mg by mouth at bedtime.     lisinopril 40 MG tablet  Commonly known as:  PRINIVIL,ZESTRIL  Take 40 mg by mouth every evening.     metoprolol tartrate 25 MG tablet  Commonly known as:  LOPRESSOR  Take 25 mg by mouth 2 (two) times daily.     OVER THE COUNTER MEDICATION  Take 1 capsule by mouth at bedtime. Sleep Essentials     OVER THE COUNTER MEDICATION  Take 5 mLs by mouth at bedtime. Valerian Root Liquid     rivaroxaban 20 MG Tabs tablet  Commonly known as:  XARELTO  Take 1 tablet (20 mg total) by mouth daily with supper.       No Known Allergies Follow-up Information    Follow up with Rachel Bo, NP. Schedule an appointment as soon as possible for a visit in 1 week.   Specialty:  Nurse Practitioner   Contact information:   [place] [address] [phone]        The results of significant diagnostics from this hospitalization (including imaging, microbiology, ancillary and laboratory) are listed below for reference.    Significant Diagnostic Studies: Dg Chest 2 View  11/23/2014   CLINICAL DATA:  Irregular heartbeat, several days duration. Left-sided chest discomfort.  EXAM: CHEST  2 VIEW  COMPARISON:  03/20/2011  FINDINGS: The lungs are clear. There are no effusions. Pulmonary vasculature is normal. Hilar, mediastinal and cardiac contours appear unremarkable.No significant skeletal abnormalities are evident.  IMPRESSION: No active cardiopulmonary disease.   Electronically Signed   By: Andreas Newport M.D.   On: 11/23/2014 05:24     Microbiology: No results found for this or any previous visit (from the past 240 hour(s)).   Labs: Basic Metabolic Panel:  Recent Labs Lab 11/23/14 0500 11/23/14 0512 11/23/14 0650  NA  --  141 139  K  --  4.0 4.1  CL  --  107 108  CO2  --   --  24  GLUCOSE  --  122* 120*  BUN  --  21 18  CREATININE  --  0.80 0.80  CALCIUM  --   --  9.1  MG 2.2  --   --    Liver Function Tests:  Recent Labs Lab 11/23/14 0650  AST 19  ALT 21  ALKPHOS 71  BILITOT 0.5  PROT 6.8  ALBUMIN 4.0  No results for input(s): LIPASE, AMYLASE in the last 168 hours. No results for input(s): AMMONIA in the last 168 hours. CBC:  Recent Labs Lab 11/23/14 0500 11/23/14 0512 11/23/14 0650  WBC 9.2  --  9.1  NEUTROABS 4.1  --  4.2  HGB 13.7 14.6 13.5  HCT 41.7 43.0 41.0  MCV 89.1  --  89.1  PLT 273  --  260   Cardiac Enzymes:  Recent Labs Lab 11/23/14 0650 11/23/14 1158  TROPONINI <0.03 <0.03   BNP: BNP (last 3 results) No results for input(s): BNP in the last 8760 hours.  ProBNP (last 3 results) No results for input(s): PROBNP in the last 8760 hours.  CBG:  Recent Labs Lab 11/23/14 1152  GLUCAP 132*       SignedDebbe Odea, MD [facility] 11/23/2014, 2:46 PM

## 2014-11-23 NOTE — ED Notes (Signed)
Pt complains of an irregular heartrate since Sunday the 7th, she has a hx of Afib but doesn't take any meds for it now, last episode about three years ago.

## 2014-11-23 NOTE — Consult Note (Signed)
CARDIOLOGY CONSULT NOTE  Patient ID: Danielle Payne MRN: [id number] DOB/AGE: Mar 18, 1946 69 y.o.  Admit date: 11/23/2014 Referring Physician:  [facility] Primary Physician:  No PCP Per Patient Reason for Consultation: Atrial fibrillation  HPI: Danielle Payne is a 69 year old Caucasian female with a history of hypertension, hyperlipidemia, and hyperglycemia who was recently seen in the office to establish care as she recently moved to [place].  She has a history of CAD with NSTEMI in 2010 during which she presented with A. fib with RVR.  She underwent cardiac catheterization which revealed 90% stenosis to the mid circumflex with stent placement and spontaneous conversion to normal sinus rhythm.  She had a recurrent episode of atrial fibrillation in 2012, underwent successful cardioversion after nuclear stress test which was negative for ischemia.  She has not had any recurrence of atrial fibrillation since then.  She states she was given the option of oral anticoagulation and chose to take only aspirin.  She states she woke up early this morning with left upper back pain and heart racing.  Her husband checked her pulse and said it was very fast and erratic and she called EMS and was transported to the emergency department.  On further questioning, she states she has been having palpitations off and on for the past week.  She states the left upper back pain started yesterday which she thought was musculoskeletal as she has been working on a computer much more over the past 2 days.  She denies any alleviating or aggravating factors including exertion, movement, or palpation.  Pain has been constant.  She denies any chest pain otherwise.  She had trouble catching her breath  this morning when she woke with palpitations, but denies shortness of breath otherwise.  She denies any PND, orthopnea, edema, dizziness, syncope, or symptoms suggestive of sleep apnea or TIA.  Her husband is present at  the bedside.  Past Medical History  Diagnosis Date  . Hypertension   . Allergy   . Stented coronary artery   . Coronary artery disease   . Asthma      Past Surgical History  Procedure Laterality Date  . Abdominal hysterectomy    . Cardiac catheterization    . Coronary angioplasty       Family History  Problem Relation Age of Onset  . CAD Mother   . Prostate cancer Father   . Diabetes Mellitus II Sister   . Diabetes Mellitus II Brother      Social History: History   Social History  . Marital Status: Married    Spouse Name: N/A  . Number of Children: N/A  . Years of Education: N/A   Occupational History  . Not on file.   Social History Main Topics  . Smoking status: Never Smoker   . Smokeless tobacco: Not on file  . Alcohol Use: No  . Drug Use: Not on file  . Sexual Activity: Not on file   Other Topics Concern  . Not on file   Social History Narrative     Prescriptions prior to admission  Medication Sig Dispense Refill Last Dose  . albuterol (PROVENTIL HFA;VENTOLIN HFA) 108 (90 BASE) MCG/ACT inhaler Inhale 2 puffs into the lungs every 4 (four) hours as needed for wheezing or shortness of breath.   11/22/2014 at 11pm  . amLODipine (NORVASC) 10 MG tablet Take 10 mg by mouth daily.   11/23/2014 at 4am  . aspirin 325 MG tablet Take 325 mg by mouth at bedtime.  3/87/5643 at 3295JO  . folic acid (FOLVITE) 1 MG tablet Take 1 mg by mouth at bedtime.   11/22/2014  . lisinopril (PRINIVIL,ZESTRIL) 40 MG tablet Take 40 mg by mouth every evening.    11/22/2014 at 4pm  . metoprolol tartrate (LOPRESSOR) 25 MG tablet Take 25 mg by mouth 2 (two) times daily.   11/23/2014 at 4am  . Omega-3 Fatty Acids (FISH OIL PO) Take 1 capsule by mouth daily.   11/23/2014  . OVER THE COUNTER MEDICATION Take 1 capsule by mouth at bedtime. Sleep Essentials   11/22/2014  . OVER THE COUNTER MEDICATION Take 5 mLs by mouth at bedtime. Valerian Root Liquid   11/22/2014    Scheduled Meds: . [START  ON 11/24/2014] amLODipine  10 mg Oral Daily  . aspirin  325 mg Oral QHS  . folic acid  1 mg Oral QHS  . lisinopril  40 mg Oral QPM  . metoprolol tartrate  25 mg Oral BID  . [START ON 11/24/2014] omega-3 acid ethyl esters  1 g Oral Daily  . sodium chloride  3 mL Intravenous Q12H   Continuous Infusions: . heparin 1,100 Units/hr (11/23/14 0709)   PRN Meds:.acetaminophen, levalbuterol, ondansetron (ZOFRAN) IV  ROS: General: no fevers/chills/night sweats Eyes: no blurry vision, diplopia, or amaurosis ENT: no sore throat or hearing loss Resp: no cough, wheezing, or hemoptysis CV: reports palpitations, no chest pain, PND, orthopnea, or edema  GI: no abdominal pain, nausea, vomiting, diarrhea, or constipation GU: no dysuria, frequency, or hematuria Skin: no rash Neuro: no dizziness, headache, numbness, tingling, or weakness of extremities Musculoskeletal: no joint pain or swelling Heme: no bleeding, DVT, or easy bruising Endo: no polydipsia or polyuria    Physical Exam: Blood pressure 135/79, pulse 72, temperature 97.6 F (36.4 C), temperature source Oral, resp. rate 14, height 5\' 3"  (1.6 m), weight 80.9 kg (178 lb 5.6 oz), SpO2 98 %.   General appearance: alert, cooperative, no distress and appears younger than stated age Neck: no adenopathy, no carotid bruit, no JVD, supple, symmetrical, trachea midline and thyroid not enlarged, symmetric, no tenderness/mass/nodules Back: no tenderness to percussion or palpation Lungs: clear to auscultation bilaterally Chest wall: no tenderness Heart: S1: variable, S2: normal and I/VI SEM over RUSB Abdomen: soft, non-tender; bowel sounds normal; no masses,  no organomegaly Extremities: extremities normal, atraumatic, no cyanosis or edema Pulses: 2+ and symmetric Skin: Skin color, texture, turgor normal. No rashes or lesions  Labs:   Lab Results  Component Value Date   WBC 9.1 11/23/2014   HGB 13.5 11/23/2014   HCT 41.0 11/23/2014   MCV 89.1  11/23/2014   PLT 260 11/23/2014    Recent Labs Lab 11/23/14 0512  NA 141  K 4.0  CL 107  BUN 21  CREATININE 0.80  GLUCOSE 122*   No results found for: CKTOTAL, CKMB, CKMBINDEX, TROPONINI  Lipid Panel  No results found for: CHOL, TRIG, HDL, CHOLHDL, VLDL, LDLCALC  EKG: atrial fibrillation, ventricular rate 66, ST depression in inferior leads, poor R-wave progression.    Radiology: Dg Chest 2 View  11/23/2014   CLINICAL DATA:  Irregular heartbeat, several days duration. Left-sided chest discomfort.  EXAM: CHEST  2 VIEW  COMPARISON:  03/20/2011  FINDINGS: The lungs are clear. There are no effusions. Pulmonary vasculature is normal. Hilar, mediastinal and cardiac contours appear unremarkable.No significant skeletal abnormalities are evident.  IMPRESSION: No active cardiopulmonary disease.   Electronically Signed   By: Andreas Newport M.D.   On: 11/23/2014  05:24      ASSESSMENT AND PLAN:  1. Atrial fibrillation: CHA2DS2-VASCScore: Risk Score 4,  Yearly risk of stroke  4.0%. Recommendation: Oral Anticoagulation: yes [place] Has Bled: Score 2 (if ASA continued for CAD).  Estimated risk of major bleeding at 1 year with [place] 1.88-3.2% 2. HTN 3. Hyperlipidemia 4. CAD  Recommendation: She presents with a.fib with moderate ventricular response.  Left upper back pain unlikely to be cardiac in etiology as it has been constant for 2+ days without any increase in cardiac enzymes.  Continue metoprolol. Start Xarelto for anticoagulation.  Continue ASA d/t CAD but decrease to 81mg .  Follow up outpatient for further evaluation and to schedule cardioversion if she remains in persistent a.fib.    Rachel Bo, NP-C 11/23/2014, 7:41 AM [facility]. [place] Pager: [phone] Office: [phone]

## 2014-11-23 NOTE — Progress Notes (Signed)
UR completed 

## 2014-11-24 LAB — T3, FREE: T3 FREE: 3.5 pg/mL (ref 2.0–4.4)

## 2014-12-21 ENCOUNTER — Ambulatory Visit (HOSPITAL_COMMUNITY)
Admission: RE | Admit: 2014-12-21 | Discharge: 2014-12-21 | Disposition: A | Discharge status: Home / Self Care | Payer: Medicare Other | Source: Ambulatory Visit | Attending: Cardiology | Admitting: Cardiology

## 2014-12-21 ENCOUNTER — Encounter (HOSPITAL_COMMUNITY): Payer: Self-pay | Admitting: *Deleted

## 2014-12-21 ENCOUNTER — Encounter (HOSPITAL_COMMUNITY)
Admission: RE | Disposition: A | Discharge status: Home / Self Care | Payer: Self-pay | Source: Ambulatory Visit | Attending: Cardiology

## 2014-12-21 ENCOUNTER — Ambulatory Visit (HOSPITAL_COMMUNITY): Payer: Medicare Other | Admitting: Anesthesiology

## 2014-12-21 DIAGNOSIS — Z955 Presence of coronary angioplasty implant and graft: Secondary | ICD-10-CM | POA: Diagnosis not present

## 2014-12-21 DIAGNOSIS — I2510 Atherosclerotic heart disease of native coronary artery without angina pectoris: Secondary | ICD-10-CM | POA: Insufficient documentation

## 2014-12-21 DIAGNOSIS — I4891 Unspecified atrial fibrillation: Secondary | ICD-10-CM | POA: Diagnosis present

## 2014-12-21 DIAGNOSIS — J45909 Unspecified asthma, uncomplicated: Secondary | ICD-10-CM | POA: Insufficient documentation

## 2014-12-21 DIAGNOSIS — I10 Essential (primary) hypertension: Secondary | ICD-10-CM | POA: Diagnosis not present

## 2014-12-21 DIAGNOSIS — Z90710 Acquired absence of both cervix and uterus: Secondary | ICD-10-CM | POA: Insufficient documentation

## 2014-12-21 HISTORY — PX: CARDIOVERSION: SHX1299

## 2014-12-21 SURGERY — CARDIOVERSION
Anesthesia: Monitor Anesthesia Care

## 2014-12-21 MED ORDER — SODIUM CHLORIDE 0.9 % IV SOLN: INTRAVENOUS | Status: DC | PRN

## 2014-12-21 MED ORDER — SODIUM CHLORIDE 0.9 % IV SOLN
INTRAVENOUS | Status: DC
  Administered 2014-12-21: 13:00:00 via INTRAVENOUS

## 2014-12-21 MED ORDER — PROPOFOL 10 MG/ML IV BOLUS
INTRAVENOUS | Status: DC | PRN
Start: 2014-12-21 — End: 2014-12-21
  Administered 2014-12-21: 100 mg via INTRAVENOUS

## 2014-12-21 MED ORDER — LIDOCAINE HCL (CARDIAC) 20 MG/ML IV SOLN
INTRAVENOUS | Status: DC | PRN
  Administered 2014-12-21: 70 mg via INTRAVENOUS

## 2014-12-21 NOTE — Interval H&P Note (Signed)
History and Physical Interval Note:  12/21/2014 1:20 PM  Danielle Payne  has presented today for surgery, with the diagnosis of afib  The various methods of treatment have been discussed with the patient and family. After consideration of risks, benefits and other options for treatment, the patient has consented to  Procedure(s): CARDIOVERSION (N/A) as a surgical intervention .  The patient's history has been reviewed, patient examined, no change in status, stable for surgery.  I have reviewed the patient's chart and labs.  Questions were answered to the patient's satisfaction.     Laverda Page

## 2014-12-21 NOTE — CV Procedure (Signed)
Direct current cardioversion:  Indication symptomatic A. Fibrillation.  Procedure: Using 100 mg of IV Propofol and 60 IV Lidocaine (for reducing venous pain) for achieving deep sedation, synchronized direct current cardioversion performed. Patient was delivered with 100 x 1 then 120 Joules of electricity X 1 with success to NSR. Patient tolerated the procedure well. No immediate complication noted.

## 2014-12-21 NOTE — H&P (Signed)
  Please see office visit notes for complete details of HPI.  

## 2014-12-21 NOTE — Transfer of Care (Signed)
Immediate Anesthesia Transfer of Care Note  Patient: Danielle Payne  Procedure(s) Performed: Procedure(s): CARDIOVERSION (N/A)  Patient Location: PACU and Endoscopy Unit  Anesthesia Type:General  Level of Consciousness: oriented, sedated, patient cooperative and responds to stimulation  Airway & Oxygen Therapy: Patient Spontanous Breathing and Patient connected to nasal cannula oxygen  Post-op Assessment: Report given to RN, Post -op Vital signs reviewed and stable, Patient moving all extremities and Patient moving all extremities X 4  Post vital signs: Reviewed and stable  Last Vitals:  Filed Vitals:   12/21/14 1047  BP: 149/98  Pulse: 83  Temp: 36.6 C  Resp: 13    Complications: No apparent anesthesia complications

## 2014-12-21 NOTE — Anesthesia Preprocedure Evaluation (Addendum)
Anesthesia Evaluation  Patient identified by MRN, date of birth, ID band Patient awake    Reviewed: Allergy & Precautions, NPO status , Patient's Chart, lab work & pertinent test results  Airway Mallampati: II   Neck ROM: Full    Dental  (+) Dental Advisory Given, Teeth Intact   Pulmonary asthma ,  breath sounds clear to auscultation        Cardiovascular hypertension, Pt. on medications + CAD + dysrhythmias Atrial Fibrillation Rhythm:Irregular  STENTS 2010 EF at that time 60%   Neuro/Psych    GI/Hepatic negative GI ROS, Neg liver ROS,   Endo/Other  negative endocrine ROS  Renal/GU negative Renal ROS     Musculoskeletal   Abdominal (+)  Abdomen: soft.    Peds  Hematology 13/41   Anesthesia Other Findings   Reproductive/Obstetrics                            Anesthesia Physical Anesthesia Plan  ASA: III  Anesthesia Plan: MAC   Post-op Pain Management:    Induction: Intravenous  Airway Management Planned: Nasal Cannula  Additional Equipment:   Intra-op Plan:   Post-operative Plan:   Informed Consent: I have reviewed the patients History and Physical, chart, labs and discussed the procedure including the risks, benefits and alternatives for the proposed anesthesia with the patient or authorized representative who has indicated his/her understanding and acceptance.     Plan Discussed with:   Anesthesia Plan Comments:         Anesthesia Quick Evaluation

## 2014-12-21 NOTE — Anesthesia Postprocedure Evaluation (Signed)
  Anesthesia Post-op Note  Patient: Location manager  Procedure(s) Performed: Procedure(s): CARDIOVERSION (N/A)  Patient Location: PACU and Endoscopy Unit  Anesthesia Type:MAC  Level of Consciousness: awake and alert   Airway and Oxygen Therapy: Patient Spontanous Breathing and Patient connected to nasal cannula oxygen  Post-op Pain: none  Post-op Assessment: Post-op Vital signs reviewed, Patient's Cardiovascular Status Stable, Respiratory Function Stable, Patent Airway and No signs of Nausea or vomiting  Post-op Vital Signs: Reviewed and stable  Last Vitals:  Filed Vitals:   12/21/14 1405  BP: 141/74  Pulse: 57  Temp:   Resp: 12    Complications: No apparent anesthesia complications

## 2014-12-21 NOTE — Discharge Instructions (Signed)
Electrical Cardioversion, Care After °Refer to this sheet in the next few weeks. These instructions provide you with information on caring for yourself after your procedure. Your health care provider may also give you more specific instructions. Your treatment has been planned according to current medical practices, but problems sometimes occur. Call your health care provider if you have any problems or questions after your procedure. °WHAT TO EXPECT AFTER THE PROCEDURE °After your procedure, it is typical to have the following sensations: °· Some redness on the skin where the shocks were delivered. If this is tender, a sunburn lotion or hydrocortisone cream may help. °· Possible return of an abnormal heart rhythm within hours or days after the procedure. °HOME CARE INSTRUCTIONS °· Take medicines only as directed by your health care provider. Be sure you understand how and when to take your medicine. °· Learn how to feel your pulse and check it often. °· Limit your activity for 48 hours after the procedure or as directed by your health care provider. °· Avoid or minimize caffeine and other stimulants as directed by your health care provider. °SEEK MEDICAL CARE IF: °· You feel like your heart is beating too fast or your pulse is not regular. °· You have any questions about your medicines. °· You have bleeding that will not stop. °SEEK IMMEDIATE MEDICAL CARE IF: °· You are dizzy or feel faint. °· It is hard to breathe or you feel short of breath. °· There is a change in discomfort in your chest. °· Your speech is slurred or you have trouble moving an arm or leg on one side of your body. °· You get a serious muscle cramp that does not go away. °· Your fingers or toes turn cold or blue. °Document Released: 07/15/2013 Document Revised: 02/08/2014 Document Reviewed: 07/15/2013 °ExitCare® Patient Information ©2015 [employer]. This information is not intended to replace advice given to you by your health care provider.  Make sure you discuss any questions you have with your health care provider. ° ° °Conscious Sedation, Adult, Care After °Refer to this sheet in the next few weeks. These instructions provide you with information on caring for yourself after your procedure. Your health care provider may also give you more specific instructions. Your treatment has been planned according to current medical practices, but problems sometimes occur. Call your health care provider if you have any problems or questions after your procedure. °WHAT TO EXPECT AFTER THE PROCEDURE  °After your procedure: °· You may feel sleepy, clumsy, and have poor balance for several hours. °· Vomiting may occur if you eat too soon after the procedure. °HOME CARE INSTRUCTIONS °· Do not participate in any activities where you could become injured for at least 24 hours. Do not: °¨ Drive. °¨ Swim. °¨ Ride a bicycle. °¨ Operate heavy machinery. °¨ Cook. °¨ Use power tools. °¨ Climb ladders. °¨ Work from a high place. °· Do not make important decisions or sign legal documents until you are improved. °· If you vomit, drink water, juice, or soup when you can drink without vomiting. Make sure you have little or no nausea before eating solid foods. °· Only take over-the-counter or prescription medicines for pain, discomfort, or fever as directed by your health care provider. °· Make sure you and your family fully understand everything about the medicines given to you, including what side effects may occur. °· You should not drink alcohol, take sleeping pills, or take medicines that cause drowsiness for at least 24   hours. °· If you smoke, do not smoke without supervision. °· If you are feeling better, you may resume normal activities 24 hours after you were sedated. °· Keep all appointments with your health care provider. °SEEK MEDICAL CARE IF: °· Your skin is pale or bluish in color. °· You continue to feel nauseous or vomit. °· Your pain is getting worse and is not  helped by medicine. °· You have bleeding or swelling. °· You are still sleepy or feeling clumsy after 24 hours. °SEEK IMMEDIATE MEDICAL CARE IF: °· You develop a rash. °· You have difficulty breathing. °· You develop any type of allergic problem. °· You have a fever. °MAKE SURE YOU: °· Understand these instructions. °· Will watch your condition. °· Will get help right away if you are not doing well or get worse. °Document Released: 07/15/2013 Document Reviewed: 07/15/2013 °ExitCare® Patient Information ©2015 [employer]. This information is not intended to replace advice given to you by your health care provider. Make sure you discuss any questions you have with your health care provider. ° °

## 2014-12-22 ENCOUNTER — Encounter (HOSPITAL_COMMUNITY): Payer: Self-pay | Admitting: Cardiology

## 2014-12-29 ENCOUNTER — Encounter (HOSPITAL_COMMUNITY): Payer: Self-pay | Admitting: Cardiology

## 2016-10-03 DIAGNOSIS — I5020 Unspecified systolic (congestive) heart failure: Secondary | ICD-10-CM | POA: Diagnosis not present

## 2016-10-03 DIAGNOSIS — Z955 Presence of coronary angioplasty implant and graft: Secondary | ICD-10-CM | POA: Diagnosis not present

## 2016-10-03 DIAGNOSIS — E8770 Fluid overload, unspecified: Secondary | ICD-10-CM | POA: Diagnosis not present

## 2016-10-03 DIAGNOSIS — N179 Acute kidney failure, unspecified: Secondary | ICD-10-CM | POA: Diagnosis not present

## 2016-10-03 DIAGNOSIS — Z951 Presence of aortocoronary bypass graft: Secondary | ICD-10-CM | POA: Diagnosis not present

## 2016-10-03 DIAGNOSIS — E785 Hyperlipidemia, unspecified: Secondary | ICD-10-CM | POA: Diagnosis not present

## 2016-10-03 DIAGNOSIS — I110 Hypertensive heart disease with heart failure: Secondary | ICD-10-CM | POA: Diagnosis not present

## 2016-10-03 DIAGNOSIS — I252 Old myocardial infarction: Secondary | ICD-10-CM | POA: Diagnosis not present

## 2016-10-03 DIAGNOSIS — E669 Obesity, unspecified: Secondary | ICD-10-CM | POA: Diagnosis not present

## 2016-10-03 DIAGNOSIS — I214 Non-ST elevation (NSTEMI) myocardial infarction: Secondary | ICD-10-CM | POA: Diagnosis not present

## 2016-10-03 DIAGNOSIS — Z48812 Encounter for surgical aftercare following surgery on the circulatory system: Secondary | ICD-10-CM | POA: Diagnosis not present

## 2016-10-03 DIAGNOSIS — E119 Type 2 diabetes mellitus without complications: Secondary | ICD-10-CM | POA: Diagnosis not present

## 2016-10-03 DIAGNOSIS — I2510 Atherosclerotic heart disease of native coronary artery without angina pectoris: Secondary | ICD-10-CM | POA: Diagnosis not present

## 2016-10-05 DIAGNOSIS — E119 Type 2 diabetes mellitus without complications: Secondary | ICD-10-CM | POA: Diagnosis not present

## 2016-10-05 DIAGNOSIS — Z48812 Encounter for surgical aftercare following surgery on the circulatory system: Secondary | ICD-10-CM | POA: Diagnosis not present

## 2016-10-05 DIAGNOSIS — I110 Hypertensive heart disease with heart failure: Secondary | ICD-10-CM | POA: Diagnosis not present

## 2016-10-05 DIAGNOSIS — I2510 Atherosclerotic heart disease of native coronary artery without angina pectoris: Secondary | ICD-10-CM | POA: Diagnosis not present

## 2016-10-05 DIAGNOSIS — I214 Non-ST elevation (NSTEMI) myocardial infarction: Secondary | ICD-10-CM | POA: Diagnosis not present

## 2016-10-05 DIAGNOSIS — I5020 Unspecified systolic (congestive) heart failure: Secondary | ICD-10-CM | POA: Diagnosis not present

## 2016-10-08 DIAGNOSIS — I2510 Atherosclerotic heart disease of native coronary artery without angina pectoris: Secondary | ICD-10-CM | POA: Diagnosis not present

## 2016-10-08 DIAGNOSIS — Z48812 Encounter for surgical aftercare following surgery on the circulatory system: Secondary | ICD-10-CM | POA: Diagnosis not present

## 2016-10-08 DIAGNOSIS — E119 Type 2 diabetes mellitus without complications: Secondary | ICD-10-CM | POA: Diagnosis not present

## 2016-10-08 DIAGNOSIS — I5020 Unspecified systolic (congestive) heart failure: Secondary | ICD-10-CM | POA: Diagnosis not present

## 2016-10-08 DIAGNOSIS — I110 Hypertensive heart disease with heart failure: Secondary | ICD-10-CM | POA: Diagnosis not present

## 2016-10-08 DIAGNOSIS — I214 Non-ST elevation (NSTEMI) myocardial infarction: Secondary | ICD-10-CM | POA: Diagnosis not present

## 2016-10-09 DIAGNOSIS — I2510 Atherosclerotic heart disease of native coronary artery without angina pectoris: Secondary | ICD-10-CM | POA: Diagnosis not present

## 2016-10-09 DIAGNOSIS — I110 Hypertensive heart disease with heart failure: Secondary | ICD-10-CM | POA: Diagnosis not present

## 2016-10-09 DIAGNOSIS — E119 Type 2 diabetes mellitus without complications: Secondary | ICD-10-CM | POA: Diagnosis not present

## 2016-10-09 DIAGNOSIS — I214 Non-ST elevation (NSTEMI) myocardial infarction: Secondary | ICD-10-CM | POA: Diagnosis not present

## 2016-10-09 DIAGNOSIS — Z48812 Encounter for surgical aftercare following surgery on the circulatory system: Secondary | ICD-10-CM | POA: Diagnosis not present

## 2016-10-09 DIAGNOSIS — I5020 Unspecified systolic (congestive) heart failure: Secondary | ICD-10-CM | POA: Diagnosis not present

## 2016-10-10 DIAGNOSIS — I110 Hypertensive heart disease with heart failure: Secondary | ICD-10-CM | POA: Diagnosis not present

## 2016-10-10 DIAGNOSIS — I214 Non-ST elevation (NSTEMI) myocardial infarction: Secondary | ICD-10-CM | POA: Diagnosis not present

## 2016-10-10 DIAGNOSIS — I2510 Atherosclerotic heart disease of native coronary artery without angina pectoris: Secondary | ICD-10-CM | POA: Diagnosis not present

## 2016-10-10 DIAGNOSIS — Z48812 Encounter for surgical aftercare following surgery on the circulatory system: Secondary | ICD-10-CM | POA: Diagnosis not present

## 2016-10-10 DIAGNOSIS — E119 Type 2 diabetes mellitus without complications: Secondary | ICD-10-CM | POA: Diagnosis not present

## 2016-10-10 DIAGNOSIS — I5020 Unspecified systolic (congestive) heart failure: Secondary | ICD-10-CM | POA: Diagnosis not present

## 2016-10-12 DIAGNOSIS — I110 Hypertensive heart disease with heart failure: Secondary | ICD-10-CM | POA: Diagnosis not present

## 2016-10-12 DIAGNOSIS — Z48812 Encounter for surgical aftercare following surgery on the circulatory system: Secondary | ICD-10-CM | POA: Diagnosis not present

## 2016-10-12 DIAGNOSIS — I214 Non-ST elevation (NSTEMI) myocardial infarction: Secondary | ICD-10-CM | POA: Diagnosis not present

## 2016-10-12 DIAGNOSIS — I2510 Atherosclerotic heart disease of native coronary artery without angina pectoris: Secondary | ICD-10-CM | POA: Diagnosis not present

## 2016-10-12 DIAGNOSIS — I5020 Unspecified systolic (congestive) heart failure: Secondary | ICD-10-CM | POA: Diagnosis not present

## 2016-10-12 DIAGNOSIS — E119 Type 2 diabetes mellitus without complications: Secondary | ICD-10-CM | POA: Diagnosis not present

## 2016-10-13 DIAGNOSIS — Y712 Prosthetic and other implants, materials and accessory cardiovascular devices associated with adverse incidents: Secondary | ICD-10-CM | POA: Diagnosis not present

## 2016-10-13 DIAGNOSIS — N179 Acute kidney failure, unspecified: Secondary | ICD-10-CM | POA: Diagnosis not present

## 2016-10-13 DIAGNOSIS — I5020 Unspecified systolic (congestive) heart failure: Secondary | ICD-10-CM | POA: Diagnosis not present

## 2016-10-13 DIAGNOSIS — I214 Non-ST elevation (NSTEMI) myocardial infarction: Secondary | ICD-10-CM | POA: Diagnosis not present

## 2016-10-13 DIAGNOSIS — I2510 Atherosclerotic heart disease of native coronary artery without angina pectoris: Secondary | ICD-10-CM | POA: Diagnosis not present

## 2016-10-13 DIAGNOSIS — I872 Venous insufficiency (chronic) (peripheral): Secondary | ICD-10-CM | POA: Diagnosis not present

## 2016-10-13 DIAGNOSIS — I110 Hypertensive heart disease with heart failure: Secondary | ICD-10-CM | POA: Diagnosis not present

## 2016-10-13 DIAGNOSIS — I83813 Varicose veins of bilateral lower extremities with pain: Secondary | ICD-10-CM | POA: Diagnosis not present

## 2016-10-13 DIAGNOSIS — E119 Type 2 diabetes mellitus without complications: Secondary | ICD-10-CM | POA: Diagnosis not present

## 2016-10-13 DIAGNOSIS — Z48812 Encounter for surgical aftercare following surgery on the circulatory system: Secondary | ICD-10-CM | POA: Diagnosis not present

## 2016-10-15 DIAGNOSIS — I110 Hypertensive heart disease with heart failure: Secondary | ICD-10-CM | POA: Diagnosis not present

## 2016-10-15 DIAGNOSIS — I5020 Unspecified systolic (congestive) heart failure: Secondary | ICD-10-CM | POA: Diagnosis not present

## 2016-10-15 DIAGNOSIS — E119 Type 2 diabetes mellitus without complications: Secondary | ICD-10-CM | POA: Diagnosis not present

## 2016-10-15 DIAGNOSIS — I2510 Atherosclerotic heart disease of native coronary artery without angina pectoris: Secondary | ICD-10-CM | POA: Diagnosis not present

## 2016-10-15 DIAGNOSIS — I214 Non-ST elevation (NSTEMI) myocardial infarction: Secondary | ICD-10-CM | POA: Diagnosis not present

## 2016-10-15 DIAGNOSIS — Z48812 Encounter for surgical aftercare following surgery on the circulatory system: Secondary | ICD-10-CM | POA: Diagnosis not present

## 2016-10-16 DIAGNOSIS — J181 Lobar pneumonia, unspecified organism: Secondary | ICD-10-CM | POA: Diagnosis not present

## 2016-10-16 DIAGNOSIS — I472 Ventricular tachycardia: Secondary | ICD-10-CM | POA: Diagnosis not present

## 2016-10-16 DIAGNOSIS — I214 Non-ST elevation (NSTEMI) myocardial infarction: Secondary | ICD-10-CM | POA: Diagnosis not present

## 2016-10-16 DIAGNOSIS — L03116 Cellulitis of left lower limb: Secondary | ICD-10-CM | POA: Diagnosis not present

## 2016-10-16 DIAGNOSIS — I110 Hypertensive heart disease with heart failure: Secondary | ICD-10-CM | POA: Diagnosis not present

## 2016-10-16 DIAGNOSIS — Z48813 Encounter for surgical aftercare following surgery on the respiratory system: Secondary | ICD-10-CM | POA: Diagnosis not present

## 2016-10-16 DIAGNOSIS — E119 Type 2 diabetes mellitus without complications: Secondary | ICD-10-CM | POA: Diagnosis not present

## 2016-10-16 DIAGNOSIS — L03115 Cellulitis of right lower limb: Secondary | ICD-10-CM | POA: Diagnosis not present

## 2016-10-16 DIAGNOSIS — J90 Pleural effusion, not elsewhere classified: Secondary | ICD-10-CM | POA: Diagnosis not present

## 2016-10-16 DIAGNOSIS — I5023 Acute on chronic systolic (congestive) heart failure: Secondary | ICD-10-CM | POA: Diagnosis not present

## 2016-10-17 DIAGNOSIS — I5020 Unspecified systolic (congestive) heart failure: Secondary | ICD-10-CM | POA: Diagnosis not present

## 2016-10-17 DIAGNOSIS — E119 Type 2 diabetes mellitus without complications: Secondary | ICD-10-CM | POA: Diagnosis not present

## 2016-10-17 DIAGNOSIS — I214 Non-ST elevation (NSTEMI) myocardial infarction: Secondary | ICD-10-CM | POA: Diagnosis not present

## 2016-10-17 DIAGNOSIS — I2510 Atherosclerotic heart disease of native coronary artery without angina pectoris: Secondary | ICD-10-CM | POA: Diagnosis not present

## 2016-10-17 DIAGNOSIS — Z48812 Encounter for surgical aftercare following surgery on the circulatory system: Secondary | ICD-10-CM | POA: Diagnosis not present

## 2016-10-17 DIAGNOSIS — I110 Hypertensive heart disease with heart failure: Secondary | ICD-10-CM | POA: Diagnosis not present

## 2016-10-18 DIAGNOSIS — L03116 Cellulitis of left lower limb: Secondary | ICD-10-CM | POA: Diagnosis present

## 2016-10-18 DIAGNOSIS — J90 Pleural effusion, not elsewhere classified: Secondary | ICD-10-CM | POA: Diagnosis not present

## 2016-10-18 DIAGNOSIS — I482 Chronic atrial fibrillation: Secondary | ICD-10-CM | POA: Diagnosis present

## 2016-10-18 DIAGNOSIS — Z7982 Long term (current) use of aspirin: Secondary | ICD-10-CM | POA: Diagnosis not present

## 2016-10-18 DIAGNOSIS — E669 Obesity, unspecified: Secondary | ICD-10-CM | POA: Diagnosis present

## 2016-10-18 DIAGNOSIS — I5023 Acute on chronic systolic (congestive) heart failure: Secondary | ICD-10-CM | POA: Diagnosis present

## 2016-10-18 DIAGNOSIS — L03115 Cellulitis of right lower limb: Secondary | ICD-10-CM | POA: Diagnosis present

## 2016-10-18 DIAGNOSIS — I517 Cardiomegaly: Secondary | ICD-10-CM | POA: Diagnosis not present

## 2016-10-18 DIAGNOSIS — I4891 Unspecified atrial fibrillation: Secondary | ICD-10-CM | POA: Diagnosis not present

## 2016-10-18 DIAGNOSIS — Z7901 Long term (current) use of anticoagulants: Secondary | ICD-10-CM | POA: Diagnosis not present

## 2016-10-18 DIAGNOSIS — Z79899 Other long term (current) drug therapy: Secondary | ICD-10-CM | POA: Diagnosis not present

## 2016-10-18 DIAGNOSIS — I214 Non-ST elevation (NSTEMI) myocardial infarction: Secondary | ICD-10-CM | POA: Diagnosis present

## 2016-10-18 DIAGNOSIS — I110 Hypertensive heart disease with heart failure: Secondary | ICD-10-CM | POA: Diagnosis present

## 2016-10-18 DIAGNOSIS — E785 Hyperlipidemia, unspecified: Secondary | ICD-10-CM | POA: Diagnosis present

## 2016-10-18 DIAGNOSIS — Z951 Presence of aortocoronary bypass graft: Secondary | ICD-10-CM | POA: Diagnosis not present

## 2016-10-18 DIAGNOSIS — Z6832 Body mass index (BMI) 32.0-32.9, adult: Secondary | ICD-10-CM | POA: Diagnosis not present

## 2016-10-18 DIAGNOSIS — I081 Rheumatic disorders of both mitral and tricuspid valves: Secondary | ICD-10-CM | POA: Diagnosis not present

## 2016-10-18 DIAGNOSIS — I2510 Atherosclerotic heart disease of native coronary artery without angina pectoris: Secondary | ICD-10-CM | POA: Diagnosis present

## 2016-10-18 DIAGNOSIS — Z8249 Family history of ischemic heart disease and other diseases of the circulatory system: Secondary | ICD-10-CM | POA: Diagnosis not present

## 2016-10-18 DIAGNOSIS — Z48813 Encounter for surgical aftercare following surgery on the respiratory system: Secondary | ICD-10-CM | POA: Diagnosis not present

## 2016-10-18 DIAGNOSIS — Z955 Presence of coronary angioplasty implant and graft: Secondary | ICD-10-CM | POA: Diagnosis not present

## 2016-10-18 DIAGNOSIS — I472 Ventricular tachycardia: Secondary | ICD-10-CM | POA: Diagnosis not present

## 2016-10-18 DIAGNOSIS — I252 Old myocardial infarction: Secondary | ICD-10-CM | POA: Diagnosis not present

## 2016-10-18 DIAGNOSIS — E119 Type 2 diabetes mellitus without complications: Secondary | ICD-10-CM | POA: Diagnosis present

## 2016-10-18 DIAGNOSIS — R918 Other nonspecific abnormal finding of lung field: Secondary | ICD-10-CM | POA: Diagnosis not present

## 2016-10-24 DIAGNOSIS — I10 Essential (primary) hypertension: Secondary | ICD-10-CM | POA: Diagnosis not present

## 2016-10-24 DIAGNOSIS — E782 Mixed hyperlipidemia: Secondary | ICD-10-CM | POA: Diagnosis not present

## 2016-10-25 DIAGNOSIS — I214 Non-ST elevation (NSTEMI) myocardial infarction: Secondary | ICD-10-CM | POA: Diagnosis not present

## 2016-10-25 DIAGNOSIS — I5020 Unspecified systolic (congestive) heart failure: Secondary | ICD-10-CM | POA: Diagnosis not present

## 2016-10-25 DIAGNOSIS — E119 Type 2 diabetes mellitus without complications: Secondary | ICD-10-CM | POA: Diagnosis not present

## 2016-10-25 DIAGNOSIS — I2510 Atherosclerotic heart disease of native coronary artery without angina pectoris: Secondary | ICD-10-CM | POA: Diagnosis not present

## 2016-10-25 DIAGNOSIS — Z48812 Encounter for surgical aftercare following surgery on the circulatory system: Secondary | ICD-10-CM | POA: Diagnosis not present

## 2016-10-25 DIAGNOSIS — I110 Hypertensive heart disease with heart failure: Secondary | ICD-10-CM | POA: Diagnosis not present

## 2016-10-26 DIAGNOSIS — Z48812 Encounter for surgical aftercare following surgery on the circulatory system: Secondary | ICD-10-CM | POA: Diagnosis not present

## 2016-10-26 DIAGNOSIS — I5020 Unspecified systolic (congestive) heart failure: Secondary | ICD-10-CM | POA: Diagnosis not present

## 2016-10-26 DIAGNOSIS — E119 Type 2 diabetes mellitus without complications: Secondary | ICD-10-CM | POA: Diagnosis not present

## 2016-10-26 DIAGNOSIS — I214 Non-ST elevation (NSTEMI) myocardial infarction: Secondary | ICD-10-CM | POA: Diagnosis not present

## 2016-10-26 DIAGNOSIS — I110 Hypertensive heart disease with heart failure: Secondary | ICD-10-CM | POA: Diagnosis not present

## 2016-10-26 DIAGNOSIS — I2510 Atherosclerotic heart disease of native coronary artery without angina pectoris: Secondary | ICD-10-CM | POA: Diagnosis not present

## 2016-10-30 DIAGNOSIS — Z48812 Encounter for surgical aftercare following surgery on the circulatory system: Secondary | ICD-10-CM | POA: Diagnosis not present

## 2016-10-30 DIAGNOSIS — I214 Non-ST elevation (NSTEMI) myocardial infarction: Secondary | ICD-10-CM | POA: Diagnosis not present

## 2016-10-30 DIAGNOSIS — I110 Hypertensive heart disease with heart failure: Secondary | ICD-10-CM | POA: Diagnosis not present

## 2016-10-30 DIAGNOSIS — I2510 Atherosclerotic heart disease of native coronary artery without angina pectoris: Secondary | ICD-10-CM | POA: Diagnosis not present

## 2016-10-30 DIAGNOSIS — N179 Acute kidney failure, unspecified: Secondary | ICD-10-CM | POA: Diagnosis not present

## 2016-10-30 DIAGNOSIS — I5020 Unspecified systolic (congestive) heart failure: Secondary | ICD-10-CM | POA: Diagnosis not present

## 2016-10-30 DIAGNOSIS — E8770 Fluid overload, unspecified: Secondary | ICD-10-CM | POA: Diagnosis not present

## 2016-10-30 DIAGNOSIS — E119 Type 2 diabetes mellitus without complications: Secondary | ICD-10-CM | POA: Diagnosis not present

## 2016-11-01 DIAGNOSIS — Z48812 Encounter for surgical aftercare following surgery on the circulatory system: Secondary | ICD-10-CM | POA: Diagnosis not present

## 2016-11-01 DIAGNOSIS — I5020 Unspecified systolic (congestive) heart failure: Secondary | ICD-10-CM | POA: Diagnosis not present

## 2016-11-01 DIAGNOSIS — I110 Hypertensive heart disease with heart failure: Secondary | ICD-10-CM | POA: Diagnosis not present

## 2016-11-01 DIAGNOSIS — E119 Type 2 diabetes mellitus without complications: Secondary | ICD-10-CM | POA: Diagnosis not present

## 2016-11-01 DIAGNOSIS — I214 Non-ST elevation (NSTEMI) myocardial infarction: Secondary | ICD-10-CM | POA: Diagnosis not present

## 2016-11-01 DIAGNOSIS — I2510 Atherosclerotic heart disease of native coronary artery without angina pectoris: Secondary | ICD-10-CM | POA: Diagnosis not present

## 2016-11-05 DIAGNOSIS — Z48812 Encounter for surgical aftercare following surgery on the circulatory system: Secondary | ICD-10-CM | POA: Diagnosis not present

## 2016-11-05 DIAGNOSIS — I5020 Unspecified systolic (congestive) heart failure: Secondary | ICD-10-CM | POA: Diagnosis not present

## 2016-11-05 DIAGNOSIS — I214 Non-ST elevation (NSTEMI) myocardial infarction: Secondary | ICD-10-CM | POA: Diagnosis not present

## 2016-11-05 DIAGNOSIS — E119 Type 2 diabetes mellitus without complications: Secondary | ICD-10-CM | POA: Diagnosis not present

## 2016-11-05 DIAGNOSIS — I2510 Atherosclerotic heart disease of native coronary artery without angina pectoris: Secondary | ICD-10-CM | POA: Diagnosis not present

## 2016-11-05 DIAGNOSIS — I110 Hypertensive heart disease with heart failure: Secondary | ICD-10-CM | POA: Diagnosis not present

## 2016-11-06 DIAGNOSIS — Z48812 Encounter for surgical aftercare following surgery on the circulatory system: Secondary | ICD-10-CM | POA: Diagnosis not present

## 2016-11-06 DIAGNOSIS — I214 Non-ST elevation (NSTEMI) myocardial infarction: Secondary | ICD-10-CM | POA: Diagnosis not present

## 2016-11-06 DIAGNOSIS — I2510 Atherosclerotic heart disease of native coronary artery without angina pectoris: Secondary | ICD-10-CM | POA: Diagnosis not present

## 2016-11-06 DIAGNOSIS — E119 Type 2 diabetes mellitus without complications: Secondary | ICD-10-CM | POA: Diagnosis not present

## 2016-11-06 DIAGNOSIS — I5020 Unspecified systolic (congestive) heart failure: Secondary | ICD-10-CM | POA: Diagnosis not present

## 2016-11-06 DIAGNOSIS — I110 Hypertensive heart disease with heart failure: Secondary | ICD-10-CM | POA: Diagnosis not present

## 2016-11-07 DIAGNOSIS — I4891 Unspecified atrial fibrillation: Secondary | ICD-10-CM | POA: Diagnosis not present

## 2016-11-07 DIAGNOSIS — Z7982 Long term (current) use of aspirin: Secondary | ICD-10-CM | POA: Diagnosis not present

## 2016-11-07 DIAGNOSIS — Q791 Other congenital malformations of diaphragm: Secondary | ICD-10-CM | POA: Diagnosis not present

## 2016-11-07 DIAGNOSIS — Z6831 Body mass index (BMI) 31.0-31.9, adult: Secondary | ICD-10-CM | POA: Diagnosis not present

## 2016-11-07 DIAGNOSIS — Z5181 Encounter for therapeutic drug level monitoring: Secondary | ICD-10-CM | POA: Diagnosis not present

## 2016-11-07 DIAGNOSIS — J90 Pleural effusion, not elsewhere classified: Secondary | ICD-10-CM | POA: Diagnosis not present

## 2016-11-07 DIAGNOSIS — I2510 Atherosclerotic heart disease of native coronary artery without angina pectoris: Secondary | ICD-10-CM | POA: Diagnosis not present

## 2016-11-07 DIAGNOSIS — I509 Heart failure, unspecified: Secondary | ICD-10-CM | POA: Diagnosis not present

## 2016-11-07 DIAGNOSIS — Z7901 Long term (current) use of anticoagulants: Secondary | ICD-10-CM | POA: Diagnosis not present

## 2016-11-08 DIAGNOSIS — I5020 Unspecified systolic (congestive) heart failure: Secondary | ICD-10-CM | POA: Diagnosis not present

## 2016-11-08 DIAGNOSIS — I2510 Atherosclerotic heart disease of native coronary artery without angina pectoris: Secondary | ICD-10-CM | POA: Diagnosis not present

## 2016-11-08 DIAGNOSIS — I110 Hypertensive heart disease with heart failure: Secondary | ICD-10-CM | POA: Diagnosis not present

## 2016-11-08 DIAGNOSIS — I214 Non-ST elevation (NSTEMI) myocardial infarction: Secondary | ICD-10-CM | POA: Diagnosis not present

## 2016-11-08 DIAGNOSIS — Z48812 Encounter for surgical aftercare following surgery on the circulatory system: Secondary | ICD-10-CM | POA: Diagnosis not present

## 2016-11-08 DIAGNOSIS — E119 Type 2 diabetes mellitus without complications: Secondary | ICD-10-CM | POA: Diagnosis not present

## 2016-11-12 DIAGNOSIS — R9431 Abnormal electrocardiogram [ECG] [EKG]: Secondary | ICD-10-CM | POA: Diagnosis not present

## 2016-11-12 DIAGNOSIS — J9811 Atelectasis: Secondary | ICD-10-CM | POA: Diagnosis not present

## 2016-11-12 DIAGNOSIS — I25810 Atherosclerosis of coronary artery bypass graft(s) without angina pectoris: Secondary | ICD-10-CM | POA: Diagnosis not present

## 2016-11-12 DIAGNOSIS — I10 Essential (primary) hypertension: Secondary | ICD-10-CM | POA: Diagnosis not present

## 2016-11-12 DIAGNOSIS — J90 Pleural effusion, not elsewhere classified: Secondary | ICD-10-CM | POA: Diagnosis not present

## 2016-11-12 DIAGNOSIS — R1013 Epigastric pain: Secondary | ICD-10-CM | POA: Diagnosis not present

## 2016-11-12 DIAGNOSIS — I252 Old myocardial infarction: Secondary | ICD-10-CM | POA: Diagnosis not present

## 2016-11-12 DIAGNOSIS — I4891 Unspecified atrial fibrillation: Secondary | ICD-10-CM | POA: Diagnosis not present

## 2016-11-12 DIAGNOSIS — Z7901 Long term (current) use of anticoagulants: Secondary | ICD-10-CM | POA: Diagnosis not present

## 2016-11-12 DIAGNOSIS — E119 Type 2 diabetes mellitus without complications: Secondary | ICD-10-CM | POA: Diagnosis not present

## 2016-11-15 DIAGNOSIS — E119 Type 2 diabetes mellitus without complications: Secondary | ICD-10-CM | POA: Diagnosis not present

## 2016-11-15 DIAGNOSIS — Z48812 Encounter for surgical aftercare following surgery on the circulatory system: Secondary | ICD-10-CM | POA: Diagnosis not present

## 2016-11-15 DIAGNOSIS — I2510 Atherosclerotic heart disease of native coronary artery without angina pectoris: Secondary | ICD-10-CM | POA: Diagnosis not present

## 2016-11-15 DIAGNOSIS — I5020 Unspecified systolic (congestive) heart failure: Secondary | ICD-10-CM | POA: Diagnosis not present

## 2016-11-15 DIAGNOSIS — I110 Hypertensive heart disease with heart failure: Secondary | ICD-10-CM | POA: Diagnosis not present

## 2016-11-15 DIAGNOSIS — I214 Non-ST elevation (NSTEMI) myocardial infarction: Secondary | ICD-10-CM | POA: Diagnosis not present

## 2016-11-17 DIAGNOSIS — E119 Type 2 diabetes mellitus without complications: Secondary | ICD-10-CM | POA: Diagnosis not present

## 2016-11-17 DIAGNOSIS — I2510 Atherosclerotic heart disease of native coronary artery without angina pectoris: Secondary | ICD-10-CM | POA: Diagnosis not present

## 2016-11-17 DIAGNOSIS — Z48812 Encounter for surgical aftercare following surgery on the circulatory system: Secondary | ICD-10-CM | POA: Diagnosis not present

## 2016-11-17 DIAGNOSIS — I214 Non-ST elevation (NSTEMI) myocardial infarction: Secondary | ICD-10-CM | POA: Diagnosis not present

## 2016-11-17 DIAGNOSIS — I110 Hypertensive heart disease with heart failure: Secondary | ICD-10-CM | POA: Diagnosis not present

## 2016-11-17 DIAGNOSIS — I5020 Unspecified systolic (congestive) heart failure: Secondary | ICD-10-CM | POA: Diagnosis not present

## 2016-11-19 DIAGNOSIS — Z48812 Encounter for surgical aftercare following surgery on the circulatory system: Secondary | ICD-10-CM | POA: Diagnosis not present

## 2016-11-19 DIAGNOSIS — I2510 Atherosclerotic heart disease of native coronary artery without angina pectoris: Secondary | ICD-10-CM | POA: Diagnosis not present

## 2016-11-19 DIAGNOSIS — I110 Hypertensive heart disease with heart failure: Secondary | ICD-10-CM | POA: Diagnosis not present

## 2016-11-19 DIAGNOSIS — I5020 Unspecified systolic (congestive) heart failure: Secondary | ICD-10-CM | POA: Diagnosis not present

## 2016-11-19 DIAGNOSIS — E119 Type 2 diabetes mellitus without complications: Secondary | ICD-10-CM | POA: Diagnosis not present

## 2016-11-19 DIAGNOSIS — I214 Non-ST elevation (NSTEMI) myocardial infarction: Secondary | ICD-10-CM | POA: Diagnosis not present

## 2016-11-22 DIAGNOSIS — I5021 Acute systolic (congestive) heart failure: Secondary | ICD-10-CM | POA: Diagnosis not present

## 2016-11-22 DIAGNOSIS — I4891 Unspecified atrial fibrillation: Secondary | ICD-10-CM | POA: Diagnosis not present

## 2016-11-22 DIAGNOSIS — I110 Hypertensive heart disease with heart failure: Secondary | ICD-10-CM | POA: Diagnosis not present

## 2016-11-23 DIAGNOSIS — Z48812 Encounter for surgical aftercare following surgery on the circulatory system: Secondary | ICD-10-CM | POA: Diagnosis not present

## 2016-11-23 DIAGNOSIS — I110 Hypertensive heart disease with heart failure: Secondary | ICD-10-CM | POA: Diagnosis not present

## 2016-11-23 DIAGNOSIS — I214 Non-ST elevation (NSTEMI) myocardial infarction: Secondary | ICD-10-CM | POA: Diagnosis not present

## 2016-11-23 DIAGNOSIS — I2510 Atherosclerotic heart disease of native coronary artery without angina pectoris: Secondary | ICD-10-CM | POA: Diagnosis not present

## 2016-11-23 DIAGNOSIS — E119 Type 2 diabetes mellitus without complications: Secondary | ICD-10-CM | POA: Diagnosis not present

## 2016-11-23 DIAGNOSIS — I5020 Unspecified systolic (congestive) heart failure: Secondary | ICD-10-CM | POA: Diagnosis not present

## 2016-11-28 DIAGNOSIS — I2510 Atherosclerotic heart disease of native coronary artery without angina pectoris: Secondary | ICD-10-CM | POA: Diagnosis not present

## 2016-11-28 DIAGNOSIS — E119 Type 2 diabetes mellitus without complications: Secondary | ICD-10-CM | POA: Diagnosis not present

## 2016-11-28 DIAGNOSIS — I5021 Acute systolic (congestive) heart failure: Secondary | ICD-10-CM | POA: Diagnosis not present

## 2016-11-28 DIAGNOSIS — I214 Non-ST elevation (NSTEMI) myocardial infarction: Secondary | ICD-10-CM | POA: Diagnosis not present

## 2016-11-28 DIAGNOSIS — I110 Hypertensive heart disease with heart failure: Secondary | ICD-10-CM | POA: Diagnosis not present

## 2016-11-28 DIAGNOSIS — Z48812 Encounter for surgical aftercare following surgery on the circulatory system: Secondary | ICD-10-CM | POA: Diagnosis not present

## 2016-11-28 DIAGNOSIS — I5020 Unspecified systolic (congestive) heart failure: Secondary | ICD-10-CM | POA: Diagnosis not present

## 2016-11-29 DIAGNOSIS — I4891 Unspecified atrial fibrillation: Secondary | ICD-10-CM | POA: Diagnosis not present

## 2016-12-04 DIAGNOSIS — I480 Paroxysmal atrial fibrillation: Secondary | ICD-10-CM | POA: Diagnosis present

## 2016-12-04 DIAGNOSIS — J209 Acute bronchitis, unspecified: Secondary | ICD-10-CM | POA: Diagnosis present

## 2016-12-04 DIAGNOSIS — J45901 Unspecified asthma with (acute) exacerbation: Secondary | ICD-10-CM | POA: Diagnosis present

## 2016-12-04 DIAGNOSIS — E785 Hyperlipidemia, unspecified: Secondary | ICD-10-CM | POA: Diagnosis present

## 2016-12-04 DIAGNOSIS — J9811 Atelectasis: Secondary | ICD-10-CM | POA: Diagnosis not present

## 2016-12-04 DIAGNOSIS — I110 Hypertensive heart disease with heart failure: Secondary | ICD-10-CM | POA: Diagnosis present

## 2016-12-04 DIAGNOSIS — J101 Influenza due to other identified influenza virus with other respiratory manifestations: Secondary | ICD-10-CM | POA: Diagnosis present

## 2016-12-04 DIAGNOSIS — I252 Old myocardial infarction: Secondary | ICD-10-CM | POA: Diagnosis not present

## 2016-12-04 DIAGNOSIS — I360 Nonrheumatic tricuspid (valve) stenosis: Secondary | ICD-10-CM | POA: Diagnosis not present

## 2016-12-04 DIAGNOSIS — E860 Dehydration: Secondary | ICD-10-CM | POA: Diagnosis present

## 2016-12-04 DIAGNOSIS — I4891 Unspecified atrial fibrillation: Secondary | ICD-10-CM | POA: Diagnosis not present

## 2016-12-04 DIAGNOSIS — I5022 Chronic systolic (congestive) heart failure: Secondary | ICD-10-CM | POA: Diagnosis present

## 2016-12-04 DIAGNOSIS — E119 Type 2 diabetes mellitus without complications: Secondary | ICD-10-CM | POA: Diagnosis present

## 2016-12-04 DIAGNOSIS — I517 Cardiomegaly: Secondary | ICD-10-CM | POA: Diagnosis not present

## 2016-12-04 DIAGNOSIS — I2510 Atherosclerotic heart disease of native coronary artery without angina pectoris: Secondary | ICD-10-CM | POA: Diagnosis present

## 2016-12-04 DIAGNOSIS — I10 Essential (primary) hypertension: Secondary | ICD-10-CM | POA: Diagnosis present

## 2016-12-04 DIAGNOSIS — J4521 Mild intermittent asthma with (acute) exacerbation: Secondary | ICD-10-CM | POA: Diagnosis not present

## 2016-12-04 LAB — URINALYSIS W/MICROSCOPIC
Bacteria: NOT DETECTED
Bilirubin: NEGATIVE
Crystals, urine: NOT DETECTED /HPF
Epithelial cells: NOT DETECTED /HPF (ref <5)
Glucose: NEGATIVE MG/DL
Hyaline cast: NOT DETECTED /LPF (ref <8)
Ketone: NEGATIVE MG/DL
Leukocyte Esterase: NEGATIVE
Nitrites: NEGATIVE
Pathologic casts: NOT DETECTED /LPF
Protein: NEGATIVE MG/DL
RBC: 1 /HPF (ref <5)
Small round cells: NOT DETECTED /HPF
Specific gravity: 1.006 (ref 1.001–1.035)
Urine occult blood: NEGATIVE
Urobilinogen: 0.2 MG/DL (ref 0.2–1)
WBC: NOT DETECTED /HPF (ref <5)
Yeast: NOT DETECTED /HPF
pH (UA): 6 (ref 5–8)

## 2016-12-04 LAB — NT-PRO BNP
NT-pro BNP: 3497 PG/ML
NT-pro BNP: A NEG

## 2016-12-04 LAB — TROPONIN I
ULTRA TROPONIN 1: 0.026 NG/ML (ref 0.000–0.040)
ULTRA TROPONIN 1: ELEVATED
ULTRA TROPONIN 1: INCREASED
ULTRA TROPONIN 1: NORMAL

## 2016-12-04 LAB — METABOLIC PANEL, COMPREHENSIVE
ALT (SGPT): 27 IU/L (ref 13–56)
AST (SGOT): 17 IU/L (ref 15–37)
Albumin: 3.9 G/DL (ref 3.4–5.0)
Alk. phosphatase: 82 U/L (ref 45–117)
Anion gap: 10.9 MMOL/L (ref 5–15)
BUN: 16 MG/DL (ref 7–18)
Bilirubin, total: 0.5 MG/DL (ref 0.2–1.2)
CO2: 24.1 MMOL/L (ref 21–32)
Calcium: 8.4 MG/DL — ABNORMAL LOW (ref 8.5–10.1)
Chloride: 100 MMOL/L (ref 98–110)
Creatinine: 0.8 mg/dL (ref 0.6–1.0)
Estimated GFR: >60 mL/min/{1.73_m2} (ref >60)
Glucose: 155 MG/DL — ABNORMAL HIGH (ref 74–140)
Potassium: 3.4 MMOL/L — ABNORMAL LOW (ref 3.5–5.1)
Protein, total: 8 G/DL (ref 6.4–8.2)
Sodium: 135 MMOL/L — ABNORMAL LOW (ref 136–145)

## 2016-12-04 LAB — CBC WITH AUTOMATED DIFF
ABS. BASOPHILS: 0 10*3/uL (ref 0.00–0.10)
ABS. EOSINOPHILS: 0 10*3/uL (ref 0.00–0.50)
ABS. IMM. GRANS.: 0.02 10*3/uL (ref 0–0.03)
ABS. LYMPHOCYTES: 0.9 10*3/uL — ABNORMAL LOW (ref 1.20–3.70)
ABS. MONOCYTES: 0.7 10*3/uL (ref 0.20–0.80)
ABS. NEUTROPHILS: 6 10*3/uL (ref 1.56–6.13)
BASOPHILS: 0.4 %
EOSINOPHILS: 0.4 %
HCT: 41.1 % (ref 36.0–45.0)
HGB: 13.6 GM/DL (ref 11.8–14.8)
IMMATURE GRANULOCYTES: 0.3 % (ref 0–0.43)
LYMPHOCYTES: 11.9 %
MCH: 27.9 PG (ref 25.6–32.2)
MCHC: 33.1 G/DL (ref 32.2–35.5)
MCV: 84.4 FL (ref 80–95)
MONOCYTES: 9.6 %
MPV: 11.3 FL (ref 9.4–12.4)
NEUTROPHILS: 77.4 %
NRBC: 0 /100 WBC (ref 0–0.02)
PLATELET: 245 10*3/uL (ref 150–400)
RBC: 4.87 M/ul (ref 3.93–5.22)
RDW: 14.4 % (ref 11.6–14.4)
WBC: 7.7 10*3/uL (ref 4.0–10.0)

## 2016-12-04 LAB — INFLUENZA A & B AG (RAPID TEST)

## 2016-12-04 LAB — URINALYSIS W/ REFLEX CULTURE

## 2016-12-04 LAB — PROTHROMBIN TIME + INR
INR: 2.5 — ABNORMAL HIGH (ref 0.7–1.0)
Prothrombin time: 25.8 s — ABNORMAL HIGH (ref 9.7–11.4)

## 2016-12-04 LAB — LACTIC ACID: LACTATE: 1 MMOL/L (ref 0.4–2.0)

## 2016-12-04 NOTE — H&P (Signed)
[facility] [address] [place] [address] [phone]      HISTORY AND PHYSICAL    Patient Name: Jaime Maxwell, Jaime Maxwell Medical Record Number: [id number]  Account Number: [id number] DOB: 10 / 17 / 1947  Admit Date: 02 / 27 / 2018 Discharge Date: / /        CHIEF COMPLAINT:  Cough and cold symptoms.    DIAGNOSIS:  Influenza B and rapid atrial fibrillation.    HISTORY OF PRESENT ILLNESS:  This 71 year old female patient was exposed to others who were sick with   flu-like symptoms from church.  She developed a cough with malaise and   shortness of breath and a temperature as well as rapid atrial fibrillation.    She recently had a triple bypass in 2017 at [facility].  Recent   cardiac echo January 2018, EF was 35-40%.  While in the emergency room, they   started her on Tamiflu.  Her 12-lead EKG showed atrial fibrillation with a   rapid ventricular response.  No PVCs or PACs were noted.  Patient was given IV   fluids.  While she was in the emergency room her temperature was 100.4, then   came down with Tylenol and fluids.  She remained in atrial fibrillation with a   rapid ventricular response.  They gave her a dose of Lopressor 5 mg IV push   with Zofran for some nausea with relief of her nausea.    PAST MEDICAL HISTORY:  1. Systolic congestive heart failure, left ventricular ejection fraction 35-40%.  2. Paroxysmal atrial fibrillation.  3. Dyslipidemia.  4. Hypertension.  5. Non-ST elevation myocardial infarction.  6. Coronary artery bypass graft x3, LIMA to LAD, vein graft to OM, vein graft   to PDA.  7. Diabetes type 2.  Hemoglobin A1c in the past has been 6.1.  8. Partial hysterectomy for fibrosis.  9. Family history of premature coronary artery disease.  10. History of renal cyst.  11. Allergic rhinitis.  12. Vitamin D deficiency.  13. History of a tick bite in 2011.  Lyme titer was negative.  14. Cataracts.    FAMILY HISTORY:   Family history of congestive heart failure on her father's side, coronary   artery disease mother's side of the family.  Both mother and father have been   deceased.    CODE STATUS:  THE PATIENT WANTS TO BE A FULL CODE.      ALLERGIES:  No known allergies.    MEDICATIONS:  Patient takes at home:  1. Aspirin 81 mg 1 tab every day.  2. Atorvastatin 40 mg 1 tab every day.    3. Carvedilol 25 mg b.i.d.  4. Coenzyme Q10 ubiquinol 100 mg capsule 1 tab every day.  5. Coumadin 5 mg 1 tab every day.  6. Digoxin 125 mcg 1 tab every day.  7. K-Lor-Con M 10 mEq extended release 1 tab every day.  8. Lasix 40 mg 1 tab every day.  9. Lisinopril 10 mg 1 tab every day.  10. Melatonin 10 mg at bedtime.      P.r.n. medications:  Zyrtec 10 mg 1 tab at bedtime p.r.n. as needed.    REVIEW OF SYSTEMS:  GENERAL:  Positive for fever.  Denies any chills at present time.  Tylenol was   given with relief.  SKIN:  Pale, warm and dry.  On her mid sternum from collarbone down to the top   of epigastric area surgical wound  healing and intact.  EYES:  Denies any itching, pain or vision changes.     EARS:  Denies any discharges or hearing loss or tinnitus.     NOSE:  Denies any recent nosebleeds or congestion.  MOUTH:  Denies any hoarseness.  RESPIRATORY:  Productive cough with shortness of breath especially on exertion.   States she cannot sleep at night because of this cough and congestion.   CARDIOVASCULAR:  Denies any chest pain.     GASTROINTESTINAL:  Denies abdominal pain, constipation or diarrhea.  GI:  Denies dysuria at present time.     NEURO:  Denies any problems swallowing.  Remains awake, alert, appropriate.   MUSCULOSKELETAL:  Complains of generalized aches and pains and malaise to her   body.  HEMATOLOGICAL:  Patient is on anticoagulation for her atrial fibrillation with   Coumadin.    All other systems are reviewed and unremarkable unless noted.    PHYSICAL EXAM:   VITAL SIGNS:  Temp 38.8, after Tylenol 37.8, heart rate 120, respirations 22,   blood pressure 168/99, SaO2 96% on room air.  GENERAL APPEARANCE:  Restless, complaining of fatigue, dry cough with shortness   of breath.  SKIN:  Warm and dry.  Surgical area noted as above midsternum.  NECK:  Supple.  No thyromegaly.  No masses.  EYES:  Pupils symmetric.  Sclerae without jaundice.  EARS, NOSE, MOUTH AND THROAT:  Denies any problems with her hearing.  External   ears, no mass or deformity.  Nose:  Nasal turbinates color pink, fine small   discharge, clear in color.  Mouth:  Moist oral mucosa, pink, uvula midline.  RESPIRATORY:  Lung sounds diffuse with expiratory wheezes.  At this time no   crepitus.  No evidence of systolic heart failure.  Chest x-ray showed elevation   of left diaphragm, cardiomegaly with a mild left basilar atelectasis.  No   evidence of pulmonary infiltrate.  CARDIOVASCULAR:  Heart rate irregular/irregular heart rate and rhythm.  Denies   any palpitations.  Denies chest pain.  No JVD noted with head of the bed at 45   degrees ankle.  PULSES:  Radial pulses, pedal pulses, dorsal pedal pulses present.     EXTREMITIES:  Lower extremities trace edema bilaterally remains as before per   husband and patient.  Pedal pulse present.  CSM good.  Homans negative.   ABDOMEN:  Bowel sounds x4 quadrants.  She had some vomiting yesterday but does   not complaints of any nausea at present time.  She did receive Zofran   earlier with relief.  No hepatomegaly.  No splenomegaly.  NEURO:  Normal cranial nerves 2-12.  No deficits noted.    ASSESSMENT AND PLAN:  1. Acute influenza B with bronchitis and bronchospasms.  Tamiflu to treat   influenza B and for bronchitis, IV Solu-Medrol 20 mg IV every 12 hours with   DuoNebs q.i.d. and albuterol respiratory treatments every 4 hours p.r.n.    Continued O2 therapy p.r.n.  2. Rapid atrial fibrillation.  For rate, continue home medications Coreg and    digoxin.  Will add Cardizem at 5 mg/hour IV hopefully to control the rate   overnight with trigger factors.  If not, we will consider increasing Coreg and   digoxin as needed.  Anticoagulation with Coumadin 5 mg.  Present time INR 2.5,   PTT 25.8.  3. Clinical dehydration status post IV fluids.  No more IV fluids needed at   this time.  We  will recheck CBC and Chem profile in a.m.  One dose of potassium   chloride 40 mEq p.o. tonight.  Will continue to hold potassium tomorrow.  4. Generalized weakness.  Physical therapy evaluation as well as to treat.  5. Chronic conditions.  Continue home medications for coronary artery disease,   hypertension, dyslipidemia and chronic systolic heart failure.  At this time,   we will hold Lasix and potassium.  Will continue atorvastatin, carvedilol,   digoxin, lisinopril.        ____________________________________  Damita Dunningsatherine Curtis, APRN      ____________________________________  Angelyn PuntJeyakumar Kandaraj, MD    CC/MODL  D:  12/04/2016 18:38:45  T:  12/04/2016 19:13:44  MModal Job#: [id number]  Doc#:  [id number]      CC: Concetta Lanier Ensignteri Ahmadpour, DO  Electronically Authenticated and Edited by:  Damita Dunningsatherine Curtis, APRN On 12/05/2016 04:19 PM EST  Electronically Authenticated by:  Angelyn PuntJeyakumar Kandaraj, MD On 12/09/2016 07:09 PM EST

## 2016-12-05 LAB — METABOLIC PANEL, BASIC
Anion gap: 10.8 MMOL/L (ref 5–15)
BUN: 16 MG/DL (ref 7–18)
CO2: 25.2 MMOL/L (ref 21–32)
Calcium: 8.8 MG/DL (ref 8.5–10.1)
Chloride: 103 MMOL/L (ref 98–110)
Creatinine: 0.8 mg/dL (ref 0.6–1.0)
Estimated GFR: >60 mL/min/{1.73_m2} (ref >60)
Glucose: 154 MG/DL — ABNORMAL HIGH (ref 74–140)
Potassium: 3.7 MMOL/L (ref 3.5–5.1)
Sodium: 139 MMOL/L (ref 136–145)

## 2016-12-05 LAB — CBC WITH AUTOMATED DIFF
ABS. BASOPHILS: 0 10*3/uL (ref 0.00–0.10)
ABS. EOSINOPHILS: 0 10*3/uL (ref 0.00–0.50)
ABS. IMM. GRANS.: 0.01 10*3/uL (ref 0–0.03)
ABS. LYMPHOCYTES: 1 10*3/uL — ABNORMAL LOW (ref 1.20–3.70)
ABS. MONOCYTES: 0.2 10*3/uL (ref 0.20–0.80)
ABS. NEUTROPHILS: 3.5 10*3/uL (ref 1.56–6.13)
BASOPHILS: 0.2 %
EOSINOPHILS: 0 %
HCT: 38.4 % (ref 36.0–45.0)
HGB: 12.3 GM/DL (ref 11.8–14.8)
IMMATURE GRANULOCYTES: 0.2 % (ref 0–0.43)
LYMPHOCYTES: 21.5 %
MCH: 27.2 PG (ref 25.6–32.2)
MCHC: 32 G/DL — ABNORMAL LOW (ref 32.2–35.5)
MCV: 84.8 FL (ref 80–95)
MONOCYTES: 4 %
MPV: 11.3 FL (ref 9.4–12.4)
NEUTROPHILS: 74.1 %
NRBC: 0 /100 WBC (ref 0–0.02)
PLATELET: 208 10*3/uL (ref 150–400)
RBC: 4.53 M/ul (ref 3.93–5.22)
RDW: 14.4 % (ref 11.6–14.4)
WBC: 4.7 10*3/uL (ref 4.0–10.0)

## 2016-12-05 LAB — PROTHROMBIN TIME + INR
INR: 2 — ABNORMAL HIGH (ref 0.7–1.0)
Prothrombin time: 20.7 s — ABNORMAL HIGH (ref 9.7–11.4)

## 2016-12-06 LAB — TROPONIN I
ULTRA TROPONIN 1: 0.122 NG/ML — ABNORMAL HIGH (ref 0.000–0.040)
ULTRA TROPONIN 1: ELEVATED
ULTRA TROPONIN 1: INCREASED
ULTRA TROPONIN 1: NORMAL

## 2016-12-06 LAB — METABOLIC PANEL, BASIC
Anion gap: 7.3 MMOL/L (ref 5–15)
BUN: 25 MG/DL — ABNORMAL HIGH (ref 7–18)
CO2: 26.7 MMOL/L (ref 21–32)
Calcium: 8.7 MG/DL (ref 8.5–10.1)
Chloride: 106 MMOL/L (ref 98–110)
Creatinine: 0.9 mg/dL (ref 0.6–1.0)
Estimated GFR: >60 mL/min/{1.73_m2} (ref >60)
Glucose: 117 MG/DL (ref 74–140)
Potassium: 3.6 MMOL/L (ref 3.5–5.1)
Sodium: 140 MMOL/L (ref 136–145)

## 2016-12-06 LAB — PROTHROMBIN TIME + INR
INR: 2.5 — ABNORMAL HIGH (ref 0.7–1.0)
Prothrombin time: 26.2 s — ABNORMAL HIGH (ref 9.7–11.4)

## 2016-12-06 LAB — GLUCOSE, POC
Glucose (POC): 232 MG/DL — ABNORMAL HIGH (ref 70–110)
Performed by: 61249

## 2016-12-07 LAB — TROPONIN I
ULTRA TROPONIN 1: 0.142 NG/ML — ABNORMAL HIGH (ref 0.000–0.040)
ULTRA TROPONIN 1: ELEVATED
ULTRA TROPONIN 1: INCREASED
ULTRA TROPONIN 1: NORMAL
ULTRA TROPONIN 1: 0.133 NG/ML — ABNORMAL HIGH (ref 0.000–0.040)
ULTRA TROPONIN 1: ELEVATED
ULTRA TROPONIN 1: INCREASED
ULTRA TROPONIN 1: NORMAL

## 2016-12-07 LAB — GLUCOSE, POC
Glucose (POC): 99 MG/DL (ref 70–110)
Glucose (POC): 159 MG/DL — ABNORMAL HIGH (ref 70–110)
Glucose (POC): 217 MG/DL — ABNORMAL HIGH (ref 70–110)
Performed by: 25140
Performed by: 25140
Performed by: 25140

## 2016-12-07 LAB — PROTHROMBIN TIME + INR
INR: 3 — ABNORMAL HIGH (ref 0.7–1.0)
Prothrombin time: 30.5 s — ABNORMAL HIGH (ref 9.7–11.4)

## 2016-12-07 LAB — DIGOXIN
DATE/TIME LAST DOSE: 800
Digoxin level: 0.29 NG/ML — ABNORMAL LOW (ref 0.80–2.00)

## 2016-12-08 LAB — GLUCOSE, POC
Glucose (POC): 98 MG/DL (ref 70–110)
Glucose (POC): 101 MG/DL (ref 70–110)
Performed by: 18371
Performed by: 18371

## 2016-12-08 LAB — METABOLIC PANEL, BASIC
Anion gap: 8.3 MMOL/L (ref 5–15)
BUN: 23 MG/DL — ABNORMAL HIGH (ref 7–18)
CO2: 31.7 MMOL/L (ref 21–32)
Calcium: 8.8 MG/DL (ref 8.5–10.1)
Chloride: 100 MMOL/L (ref 98–110)
Creatinine: 1 mg/dL (ref 0.6–1.0)
Estimated GFR: 55 mL/min/{1.73_m2} — ABNORMAL LOW (ref >60)
Glucose: 104 MG/DL (ref 74–140)
Potassium: 3.8 MMOL/L (ref 3.5–5.1)
Sodium: 140 MMOL/L (ref 136–145)

## 2016-12-08 LAB — PROTHROMBIN TIME + INR
INR: 2.9 — ABNORMAL HIGH (ref 0.7–1.0)
Prothrombin time: 29.3 s — ABNORMAL HIGH (ref 9.7–11.4)

## 2016-12-08 NOTE — Discharge Summary (Signed)
[facility] [address] [place] [address] [phone]      DISCHARGE SUMMARY    Patient Name: Jaime Maxwell, Jaime Maxwell Medical Record Number: [id number]  Account Number: [id number] DOB: 10 / 17 / 1947  Admit Date: 02 / 27 / 2018 Discharge Date: 03 / 03 / 2018        DISCHARGE DIAGNOSES:  1. Influenza B.  2. Asthma exacerbation.  3. Rapid atrial fibrillation.    SECONDARY DIAGNOSES:  1. Coronary artery disease with a history of non-ST-segment elevation   myocardial infarction status post 3-vessel CABG 2017.  2. Type 2 diabetes mellitus.    3. Hypertension.  4. Dyslipidemia.  5. History of congestive heart failure.  6. Asthma.  7. Allergic rhinitis.    DIAGNOSTIC TESTS:  Influenza swab positive for B antigen.     Chest x-ray:  Cardiomegaly, left basilar discoid atelectasis.     Blood culture: No growth x 3 days in 2 specimens.     WBC 7.7, hemoglobin 13.6, platelets 245, 77.4% neutrophils.    ProBNP 3947.    Troponin 0.026.    Lactate 1.  Sodium 135, potassium 3.4, chloride 100, CO2 24.1, BUN 16, creatinine 0.8,   glucose 155, GFR above 60.  Hepatic panel normal.    Troponin peak 0.142.    Digoxin level 0.29.  Cardiac echo:  Normal left ventricular size, mild concentric left ventricular   hypertrophy.  No focal contraction abnormalities.  Estimated LVEF 60 percent.    No significant valvular heart disease or pericardial effusion.  INR (12/08/2016): 2.9.    DISCHARGE MEDICATIONS:  Co-Enzyme Q 1 capsule p.o. daily  Oseltamivir 75 mg p.o. b.i.d. for 2 more doses  Atorvastatin 40 mg p.o. q.h.s.  Lisinopril 10 mg p.o. daily  Carvedilol 37.5 mg p.o. twice a day  Diltiazem ER 180 mg p.o. daily  Digoxin 0.125 mg p.o. daily  Furosemide 40 mg p.o. daily  Melatonin 10 mg p.o. q.h.s.  Potassium chloride 10 mEq p.o. daily  Prednisone 20 mg p.o. daily for 5 days (take with breakfast)  Docusate 100 mg p.o. twice a day  Coumadin 2.5 mg p.o. q.h.s.  Enteric-coated aspirin 81 mg p.o. daily  Singulair 10 mg p.o. q.h.s.   Advair 100/50 1 inhalation twice a day.    DISCHARGE PLANS:  DISPOSITION: Home.  DIET: Cardiac, consistent carbohydrate.  ACTIVITY: As tolerated.  FOLLOW-UP TESTS: INR on 12/10/2016.  Follow up final blood culture results.  FOLLOW-UP APPOINTMENTS: PCP (Concetta Oteri Ahmadpour, DO) in 1 week.    CODE STATUS: FULL.    DISCHARGE PROCESS: Greater than 60 minutes.    HISTORY OF PRESENT ILLNESS:  Please refer to the history and physical examination dictated by APRN Damita Dunnings and Dr. Angelyn Punt, which should be part of this discharge   summary. Jaime Maxwell is a 71 year old with multiple medical problems as outlined   above including coronary artery disease with history of non-ST-segment   elevation myocardial infarction status post 3-vessel CABG in 2017, type 2   diabetes mellitus, asthma with allergic rhinitis, who has had exposure to   influenza.  She presented with fever, malaise, cough, shortness of breath and   was found to be in rapid atrial fibrillation on presentation to the emergency   room.  Temperature was 100.4.  See History and Physical Examination for details.    HOSPITAL COURSE AND SUMMARY:  Influenza swab was positive for B antigen. The patient was started on   oseltamivir,  systemic steroids, bronchodilators, oxygen supplementation, IV   fluids and a Cardizem infusion.  Due to improvement, they were able to wean off   her Cardizem infusion and her Solu-Medrol was changed to a tapering dose of   prednisone.    I took over the patient's care on 12/06/2016.  At the time, she still was short   of breath, was wheezing and had a cough and had heart rates at rest and with   minimal activity in the 120s.  It was at that point that I confirmed that she   did have a diagnosis of asthma in addition to the allergic rhinitis.  I added   Advair and Singulair to her regimen.     I increased her carvedilol to 37.5 mg twice a day.  A digoxin level was    nontoxic.  According to her records, her last left ventricular ejection   fraction was 35% to 40% on January 2018. I repeated a cardiac echo which now   showed normalization of her left ventricular ejection fraction.  Thus, I was   able to start Cardizem at a dose of 180 mg a day.  During the periods when her   heart rate was fast, she also had complaints of chest discomfort.  There were   no changes suggestive of ischemia on EKG but she had a mild elevation in   troponin, as above.  She has no wall motion abnormalities on cardiac echo.  Her   symptoms resolved.  Her around-the-clock bronchodilators were discontinued.    Yesterday, she was reported to have heart rates in the 130s to 170s.  Cardizem   infusion was planned but her heart rate normalized with 20 mg IV dose of   Cardizem.  She was continued on the same oral dose of Cardizem CD.  Her INR is   2.9 after a decrease in her Coumadin dose from 5 to 2 mg at bedtime.  She will   be discharged on Coumadin 2.5 mg q.h.s.      I am told by the nurse that she has walked around the unit with heart rates   around 100.  The nurse will walk her again today.  If her heart rate remains   controlled, she will be discharged on the above regimen.  Her lungs are   currently clear without any bronchospasm.  She feels improved.  The rest of her   medical problems remain stable.        ____________________________________  Christie Nottinghamyrille Azaylea Maves, MD    CC/MODL  D:  12/08/2016 15:12:37  T:  12/08/2016 15:56:50  MModal Job#: [id number]  Doc#:  [id number]      CC: Dois Davenportoncetta Oteri Ahmadpour, DO  Electronically Authenticated and Edited by:  Christie Nottinghamyrille Sanaii Caporaso, MD On 12/09/2016 08:21 PM EST

## 2016-12-08 NOTE — Discharge Summary (Signed)
[facility] [address] [place] [phone]      DISCHARGE SUMMARY    Patient Name: Jaime Maxwell , Jaime Maxwell Medical Record Number: [id number]  Account Number: [id number] DOB: 10 / 17 / 1947  Admit Date: 02 / 27 / 2018 Discharge Date: 03 / 03 / 2018        DISCHARGE DIAGNOSES:  1. Influenza B.  2. Asthma exacerbation.  3. Rapid atrial fibrillation.    SECONDARY DIAGNOSES:  1. Coronary artery disease with a history of non-ST-segment elevation   myocardial infarction status post 3-vessel CABG 2017.  2. Type 2 diabetes mellitus.    3. Hypertension.  4. Dyslipidemia.  5. History of congestive heart failure.  6. Asthma.  7. Allergic rhinitis.    DIAGNOSTIC TESTS:  Influenza swab positive for B antigen.     Chest x-ray:  Cardiomegaly, left basilar discoid atelectasis.     Blood culture: No growth x 3 days in 2 specimens.     WBC 7.7, hemoglobin 13.6, platelets 245, 77.4% neutrophils.    ProBNP 3947.    Troponin 0.026.    Lactate 1.  Sodium 135, potassium 3.4, chloride 100, CO2 24.1, BUN 16, creatinine 0.8,   glucose 155, GFR above 60.  Hepatic panel normal.    Troponin peak 0.142.    Digoxin level 0.29.  Cardiac echo:  Normal left ventricular size, mild concentric left ventricular   hypertrophy.  No focal contraction abnormalities.  Estimated LVEF 60 percent.    No significant valvular heart disease or pericardial effusion.  INR (12/08/2016): 2.9.    DISCHARGE MEDICATIONS:  Co-Enzyme Q 1 capsule p.o. daily  Oseltamivir 75 mg p.o. b.i.d. for 2 more doses  Atorvastatin 40 mg p.o. q.h.s.  Lisinopril 10 mg p.o. daily  Carvedilol 37.5 mg p.o. twice a day  Diltiazem ER 180 mg p.o. daily  Digoxin 0.125 mg p.o. daily  Furosemide 40 mg p.o. daily  Melatonin 10 mg p.o. q.h.s.  Potassium chloride 10 mEq p.o. daily  Prednisone 20 mg p.o. daily for 5 days (take with breakfast)  Docusate 100 mg p.o. twice a day  Coumadin 2.5 mg p.o. q.h.s.  Enteric-coated aspirin 81 mg p.o. daily  Singulair 10 mg p.o. q.h.s.  Advair 100/50  1 inhalation twice a day.    DISCHARGE PLANS:  DISPOSITION: Home.  DIET: Cardiac, consistent carbohydrate.  ACTIVITY: As tolerated.  FOLLOW-UP TESTS: INR on 12/10/2016.  Follow up final blood culture results.  FOLLOW-UP APPOINTMENTS: PCP (Concetta Oteri Ahmadpour, DO) in 1 week.    CODE STATUS: FULL.    DISCHARGE PROCESS: Greater than 60 minutes.    HISTORY OF PRESENT ILLNESS:  Please refer to the history and physical examination dictated by APRN Damita Dunningsatherine   Curtis and Dr. Angelyn PuntJeyakumar Kandaraj, which should be part of this discharge   summary. Jaime Maxwell is a 71 year old with multiple medical problems as outlined   above including coronary artery disease with history of non-ST-segment   elevation myocardial infarction status post 3-vessel CABG in 2017, type 2   diabetes mellitus, asthma with allergic rhinitis, who has had exposure to   influenza.  She presented with fever, malaise, cough, shortness of breath and   was found to be in rapid atrial fibrillation on presentation to the emergency   room.  Temperature was 100.4.  See History and Physical Examination for details.    HOSPITAL COURSE AND SUMMARY:  Influenza swab was positive for B antigen. The patient was started on   oseltamivir,  systemic steroids, bronchodilators, oxygen supplementation, IV   fluids and a Cardizem infusion.  Due to improvement, they were able to wean off   her Cardizem infusion and her Solu-Medrol was changed to a tapering dose of   prednisone.    I took over the patient's care on 12/06/2016.  At the time, she still was short   of breath, was wheezing and had a cough and had heart rates at rest and with   minimal activity in the 120s.  It was at that point that I confirmed that she   did have a diagnosis of asthma in addition to the allergic rhinitis.  I added   Advair and Singulair to her regimen.     I increased her carvedilol to 37.5 mg twice a day.  A digoxin level was   nontoxic.  According to her records, her last left ventricular ejection    fraction was 35% to 40% on January 2018. I repeated a cardiac echo which now   showed normalization of her left ventricular ejection fraction.  Thus, I was   able to start Cardizem at a dose of 180 mg a day.  During the periods when her   heart rate was fast, she also had complaints of chest discomfort.  There were   no changes suggestive of ischemia on EKG but she had a mild elevation in   troponin, as above.  She has no wall motion abnormalities on cardiac echo.  Her   symptoms resolved.  Her around-the-clock bronchodilators were discontinued.    Yesterday, she was reported to have heart rates in the 130s to 170s.  Cardizem   infusion was planned but her heart rate normalized with 20 mg IV dose of   Cardizem.  She was continued on the same oral dose of Cardizem CD.  Her INR is   2.9 after a decrease in her Coumadin dose from 5 to 2 mg at bedtime.  She will   be discharged on Coumadin 2.5 mg q.h.s.      I am told by the nurse that she has walked around the unit with heart rates   around 100.  The nurse will walk her again today.  If her heart rate remains   controlled, she will be discharged on the above regimen.  Her lungs are   currently clear without any bronchospasm.  She feels improved.  The rest of her   medical problems remain stable.        ____________________________________  Christie Nottingham, MD    CC/MODL  D:  12/08/2016 15:12:37  T:  12/08/2016 15:56:50  MModal Job#: [id number]  Doc#:  [id number]      CC: Dois Davenport, DO  Electronically Authenticated and Edited by:  Christie Nottingham, MD On 12/09/2016 08:21 PM EST

## 2016-12-09 LAB — CULTURE, BLOOD

## 2016-12-11 DIAGNOSIS — Z5181 Encounter for therapeutic drug level monitoring: Secondary | ICD-10-CM | POA: Diagnosis not present

## 2016-12-11 DIAGNOSIS — Z7901 Long term (current) use of anticoagulants: Secondary | ICD-10-CM | POA: Diagnosis not present

## 2016-12-11 DIAGNOSIS — I482 Chronic atrial fibrillation: Secondary | ICD-10-CM | POA: Diagnosis not present

## 2016-12-14 DIAGNOSIS — I482 Chronic atrial fibrillation: Secondary | ICD-10-CM | POA: Diagnosis not present

## 2016-12-14 DIAGNOSIS — Z5181 Encounter for therapeutic drug level monitoring: Secondary | ICD-10-CM | POA: Diagnosis not present

## 2016-12-14 DIAGNOSIS — Z7901 Long term (current) use of anticoagulants: Secondary | ICD-10-CM | POA: Diagnosis not present

## 2016-12-14 DIAGNOSIS — I10 Essential (primary) hypertension: Secondary | ICD-10-CM | POA: Diagnosis not present

## 2016-12-17 DIAGNOSIS — R7309 Other abnormal glucose: Secondary | ICD-10-CM | POA: Diagnosis not present

## 2016-12-17 DIAGNOSIS — E782 Mixed hyperlipidemia: Secondary | ICD-10-CM | POA: Diagnosis not present

## 2016-12-17 DIAGNOSIS — E559 Vitamin D deficiency, unspecified: Secondary | ICD-10-CM | POA: Diagnosis not present

## 2016-12-17 DIAGNOSIS — Z5181 Encounter for therapeutic drug level monitoring: Secondary | ICD-10-CM | POA: Diagnosis not present

## 2016-12-17 DIAGNOSIS — E8881 Metabolic syndrome: Secondary | ICD-10-CM | POA: Diagnosis not present

## 2016-12-17 DIAGNOSIS — E539 Vitamin B deficiency, unspecified: Secondary | ICD-10-CM | POA: Diagnosis not present

## 2016-12-17 DIAGNOSIS — Z7901 Long term (current) use of anticoagulants: Secondary | ICD-10-CM | POA: Diagnosis not present

## 2016-12-17 DIAGNOSIS — I482 Chronic atrial fibrillation: Secondary | ICD-10-CM | POA: Diagnosis not present

## 2016-12-17 LAB — METABOLIC PANEL, COMPREHENSIVE
ALT (SGPT): 25 IU/L (ref 13–56)
AST (SGOT): 10 IU/L — ABNORMAL LOW (ref 15–37)
Albumin: 3.1 G/DL — ABNORMAL LOW (ref 3.4–5.0)
Alk. phosphatase: 74 U/L (ref 45–117)
Anion gap: 8.8 MMOL/L (ref 5–15)
BUN: 21 MG/DL — ABNORMAL HIGH (ref 7–18)
Bilirubin, total: 0.6 MG/DL (ref 0.2–1.2)
CO2: 25.2 MMOL/L (ref 21–32)
Calcium: 8.4 MG/DL — ABNORMAL LOW (ref 8.5–10.1)
Chloride: 105 MMOL/L (ref 98–110)
Creatinine: 0.8 mg/dL (ref 0.6–1.0)
Estimated GFR: >60 mL/min/{1.73_m2} (ref >60)
FASTING GLUCOSE: 133 MG/DL — ABNORMAL HIGH (ref 70–100)
Potassium: 4 MMOL/L (ref 3.5–5.1)
Protein, total: 6.7 G/DL (ref 6.4–8.2)
Sodium: 139 MMOL/L (ref 136–145)

## 2016-12-17 LAB — LIPID PANEL
Cholesterol, total: 106 MG/DL (ref 0–200)
HDL Cholesterol: 36 MG/DL — ABNORMAL LOW (ref >60)
HDL Cholesterol: HIGH
LDL, calculated: 44 MG/DL (ref <100)
LDL, calculated: BORDERLINE
LDL, calculated: HIGH
Triglyceride: 131 MG/DL (ref 0–150)

## 2016-12-17 LAB — VITAMIN D, 25 HYDROXY: 25-OH VITAMIN D, TOTAL: 28.4 NG/ML

## 2016-12-17 LAB — PROTHROMBIN TIME + INR
INR: 3.5 — ABNORMAL HIGH (ref 0.7–1.0)
Prothrombin time: 35.6 s — ABNORMAL HIGH (ref 9.7–11.4)

## 2016-12-17 LAB — HEMOGLOBIN A1C WITH EAG
A1C %: 6.9 % — ABNORMAL HIGH (ref 4.2–6.3)
Estimated Average Glucose: 151.3 MG/DL

## 2016-12-17 LAB — CALL RESULT TO

## 2016-12-17 LAB — CRP, HIGH SENSITIVITY: CRP, High sensitivity: 2.16 mg/dL — ABNORMAL HIGH (ref <0.300)

## 2016-12-17 LAB — DIGOXIN: Digoxin level: 0.44 NG/ML — ABNORMAL LOW (ref 0.80–2.00)

## 2016-12-17 LAB — T4, FREE: FREE THYROXINE: 1.15 NG/DL (ref 0.76–1.46)

## 2016-12-17 LAB — T3, FREE: FREE T3: 2.9 PG/ML (ref 2.18–3.98)

## 2016-12-17 LAB — TSH 3RD GENERATION: TSH HIGHLY SENSITIVE: 0.7 u[IU]/mL (ref 0.358–3.740)

## 2016-12-18 LAB — THYROGLOBULIN AB
Thyroglobulin Ab: 3050
Thyroglobulin Ab: <1.8

## 2016-12-19 LAB — HOMOCYSTEINE, PLASMA
HOMOCYSTEINE,TOTAL: 10
HOMOCYSTEINE,TOTAL: 200

## 2016-12-19 LAB — MISC. LAB TEST: MISC SENDOUT RESULT: 3050

## 2016-12-19 LAB — THYROID PEROXIDASE (TPO) AB
Thyroid peroxidase Ab: 0.7
Thyroid peroxidase Ab: 3050

## 2016-12-21 DIAGNOSIS — Z5181 Encounter for therapeutic drug level monitoring: Secondary | ICD-10-CM | POA: Diagnosis not present

## 2016-12-21 DIAGNOSIS — Z7901 Long term (current) use of anticoagulants: Secondary | ICD-10-CM | POA: Diagnosis not present

## 2016-12-21 DIAGNOSIS — I482 Chronic atrial fibrillation: Secondary | ICD-10-CM | POA: Diagnosis not present

## 2016-12-26 DIAGNOSIS — I4891 Unspecified atrial fibrillation: Secondary | ICD-10-CM | POA: Diagnosis not present

## 2016-12-26 DIAGNOSIS — Z7901 Long term (current) use of anticoagulants: Secondary | ICD-10-CM | POA: Diagnosis not present

## 2016-12-26 DIAGNOSIS — Z5181 Encounter for therapeutic drug level monitoring: Secondary | ICD-10-CM | POA: Diagnosis not present

## 2016-12-26 LAB — PROTHROMBIN TIME + INR
INR: 4.7 — ABNORMAL HIGH (ref 0.7–1.0)
Prothrombin time: 48.2 s — ABNORMAL HIGH (ref 9.7–11.4)

## 2016-12-26 LAB — CALL RESULT TO

## 2016-12-28 DIAGNOSIS — M9903 Segmental and somatic dysfunction of lumbar region: Secondary | ICD-10-CM | POA: Diagnosis not present

## 2016-12-28 DIAGNOSIS — I482 Chronic atrial fibrillation: Secondary | ICD-10-CM | POA: Diagnosis not present

## 2016-12-28 DIAGNOSIS — Z7901 Long term (current) use of anticoagulants: Secondary | ICD-10-CM | POA: Diagnosis not present

## 2016-12-28 DIAGNOSIS — M9905 Segmental and somatic dysfunction of pelvic region: Secondary | ICD-10-CM | POA: Diagnosis not present

## 2016-12-28 DIAGNOSIS — M545 Low back pain: Secondary | ICD-10-CM | POA: Diagnosis not present

## 2016-12-28 DIAGNOSIS — M9901 Segmental and somatic dysfunction of cervical region: Secondary | ICD-10-CM | POA: Diagnosis not present

## 2016-12-28 DIAGNOSIS — Z5181 Encounter for therapeutic drug level monitoring: Secondary | ICD-10-CM | POA: Diagnosis not present

## 2016-12-28 DIAGNOSIS — M542 Cervicalgia: Secondary | ICD-10-CM | POA: Diagnosis not present

## 2016-12-31 DIAGNOSIS — Z951 Presence of aortocoronary bypass graft: Secondary | ICD-10-CM | POA: Diagnosis not present

## 2017-01-01 DIAGNOSIS — Z7901 Long term (current) use of anticoagulants: Secondary | ICD-10-CM | POA: Diagnosis not present

## 2017-01-01 DIAGNOSIS — I482 Chronic atrial fibrillation: Secondary | ICD-10-CM | POA: Diagnosis not present

## 2017-01-01 DIAGNOSIS — Z5181 Encounter for therapeutic drug level monitoring: Secondary | ICD-10-CM | POA: Diagnosis not present

## 2017-01-04 DIAGNOSIS — Z7901 Long term (current) use of anticoagulants: Secondary | ICD-10-CM | POA: Diagnosis not present

## 2017-01-04 DIAGNOSIS — Z5181 Encounter for therapeutic drug level monitoring: Secondary | ICD-10-CM | POA: Diagnosis not present

## 2017-01-04 DIAGNOSIS — I482 Chronic atrial fibrillation: Secondary | ICD-10-CM | POA: Diagnosis not present

## 2017-01-08 DIAGNOSIS — Z951 Presence of aortocoronary bypass graft: Secondary | ICD-10-CM | POA: Diagnosis not present

## 2017-01-08 DIAGNOSIS — Z5181 Encounter for therapeutic drug level monitoring: Secondary | ICD-10-CM | POA: Diagnosis not present

## 2017-01-08 DIAGNOSIS — Z7901 Long term (current) use of anticoagulants: Secondary | ICD-10-CM | POA: Diagnosis not present

## 2017-01-08 DIAGNOSIS — I482 Chronic atrial fibrillation: Secondary | ICD-10-CM | POA: Diagnosis not present

## 2017-01-11 DIAGNOSIS — E039 Hypothyroidism, unspecified: Secondary | ICD-10-CM | POA: Diagnosis not present

## 2017-01-11 DIAGNOSIS — E782 Mixed hyperlipidemia: Secondary | ICD-10-CM | POA: Diagnosis not present

## 2017-01-15 DIAGNOSIS — Z5181 Encounter for therapeutic drug level monitoring: Secondary | ICD-10-CM | POA: Diagnosis not present

## 2017-01-15 DIAGNOSIS — Z951 Presence of aortocoronary bypass graft: Secondary | ICD-10-CM | POA: Diagnosis not present

## 2017-01-15 DIAGNOSIS — Z7901 Long term (current) use of anticoagulants: Secondary | ICD-10-CM | POA: Diagnosis not present

## 2017-01-15 DIAGNOSIS — I482 Chronic atrial fibrillation: Secondary | ICD-10-CM | POA: Diagnosis not present

## 2017-01-17 DIAGNOSIS — Z951 Presence of aortocoronary bypass graft: Secondary | ICD-10-CM | POA: Diagnosis not present

## 2017-01-22 DIAGNOSIS — I481 Persistent atrial fibrillation: Secondary | ICD-10-CM | POA: Diagnosis not present

## 2017-01-22 DIAGNOSIS — Z7901 Long term (current) use of anticoagulants: Secondary | ICD-10-CM | POA: Diagnosis not present

## 2017-01-22 DIAGNOSIS — I482 Chronic atrial fibrillation: Secondary | ICD-10-CM | POA: Diagnosis not present

## 2017-01-22 DIAGNOSIS — Z6830 Body mass index (BMI) 30.0-30.9, adult: Secondary | ICD-10-CM | POA: Diagnosis not present

## 2017-01-22 DIAGNOSIS — Z5181 Encounter for therapeutic drug level monitoring: Secondary | ICD-10-CM | POA: Diagnosis not present

## 2017-01-22 DIAGNOSIS — E785 Hyperlipidemia, unspecified: Secondary | ICD-10-CM | POA: Diagnosis not present

## 2017-01-22 DIAGNOSIS — Z7982 Long term (current) use of aspirin: Secondary | ICD-10-CM | POA: Diagnosis not present

## 2017-01-22 DIAGNOSIS — I10 Essential (primary) hypertension: Secondary | ICD-10-CM | POA: Diagnosis not present

## 2017-01-22 DIAGNOSIS — I25810 Atherosclerosis of coronary artery bypass graft(s) without angina pectoris: Secondary | ICD-10-CM | POA: Diagnosis not present

## 2017-01-22 DIAGNOSIS — Z951 Presence of aortocoronary bypass graft: Secondary | ICD-10-CM | POA: Diagnosis not present

## 2017-01-24 DIAGNOSIS — Z951 Presence of aortocoronary bypass graft: Secondary | ICD-10-CM | POA: Diagnosis not present

## 2017-01-31 DIAGNOSIS — I482 Chronic atrial fibrillation: Secondary | ICD-10-CM | POA: Diagnosis not present

## 2017-01-31 DIAGNOSIS — Z7901 Long term (current) use of anticoagulants: Secondary | ICD-10-CM | POA: Diagnosis not present

## 2017-01-31 DIAGNOSIS — Z5181 Encounter for therapeutic drug level monitoring: Secondary | ICD-10-CM | POA: Diagnosis not present

## 2017-02-05 DIAGNOSIS — Z951 Presence of aortocoronary bypass graft: Secondary | ICD-10-CM

## 2017-02-06 DIAGNOSIS — I482 Chronic atrial fibrillation: Secondary | ICD-10-CM | POA: Diagnosis not present

## 2017-02-06 DIAGNOSIS — Z5181 Encounter for therapeutic drug level monitoring: Secondary | ICD-10-CM | POA: Diagnosis not present

## 2017-02-06 DIAGNOSIS — Z7901 Long term (current) use of anticoagulants: Secondary | ICD-10-CM | POA: Diagnosis not present

## 2017-02-07 ENCOUNTER — Encounter

## 2017-02-07 DIAGNOSIS — I2510 Atherosclerotic heart disease of native coronary artery without angina pectoris: Secondary | ICD-10-CM | POA: Diagnosis not present

## 2017-02-07 DIAGNOSIS — Z951 Presence of aortocoronary bypass graft: Secondary | ICD-10-CM | POA: Diagnosis not present

## 2017-02-12 DIAGNOSIS — I2510 Atherosclerotic heart disease of native coronary artery without angina pectoris: Secondary | ICD-10-CM | POA: Diagnosis not present

## 2017-02-12 DIAGNOSIS — Z951 Presence of aortocoronary bypass graft: Secondary | ICD-10-CM | POA: Diagnosis not present

## 2017-02-13 DIAGNOSIS — J45909 Unspecified asthma, uncomplicated: Secondary | ICD-10-CM | POA: Diagnosis not present

## 2017-02-13 NOTE — Procedures (Unsigned)
PULMONARY FUNCTION TEST        DATE OF PROCEDURE:    COMPLETE PULMONARY FUNCTION STUDY:  Effort and cooperation are good.  The flow volume loop suggests both obstruction and restriction.     Spirometry reveals a low normal vital capacity and further reduction in the FEV1, consistent with mild obstruction, which is unchanged by inhaled bronchodilator.     Total lung capacity is normal.  There is mild air trapping seen.     Specific airway conductance is low.     The diffusing capacity is low, but corrects to normal for ventilated alveolar volume.    IMPRESSION:  Mild obstructive process with slight reduction in the FEV1 and air trapping.  Correcting diffusion abnormality also consistent with airway disease.          ____________________________________  Cherly HensenWilliam C Stephan, MD, FCCP    WCS/MODL  D:  02/18/2017 12:15:12  T:  02/18/2017 13:30:54  MModal Job#: [id number]  Doc#:  [id number]

## 2017-02-14 DIAGNOSIS — Z7901 Long term (current) use of anticoagulants: Secondary | ICD-10-CM | POA: Diagnosis not present

## 2017-02-14 DIAGNOSIS — I482 Chronic atrial fibrillation: Secondary | ICD-10-CM | POA: Diagnosis not present

## 2017-02-14 DIAGNOSIS — Z5181 Encounter for therapeutic drug level monitoring: Secondary | ICD-10-CM | POA: Diagnosis not present

## 2017-02-19 ENCOUNTER — Inpatient Hospital Stay: Admit: 2017-02-19 | Payer: MEDICARE | Primary: Family Medicine

## 2017-02-19 DIAGNOSIS — I2510 Atherosclerotic heart disease of native coronary artery without angina pectoris: Secondary | ICD-10-CM | POA: Diagnosis not present

## 2017-02-19 DIAGNOSIS — Z951 Presence of aortocoronary bypass graft: Secondary | ICD-10-CM | POA: Diagnosis not present

## 2017-02-19 NOTE — Other (Signed)
Client tolerated exercise without any sxs. We will be sending an updated note to cardiologist. s

## 2017-02-19 NOTE — Other (Signed)
[place] has first appt today. > 2 # weight gain, out to eat over the week end, denies SOB,  Lungs clear bilaterally, no edema.

## 2017-02-19 NOTE — Other (Signed)
Client commented during first ex circuit that she has experienced intermittant episodes of fatigue/light-headedness mid-morning after taking cardiazem prompting her to lay down, symptoms resolve after 1 hour.  Episodes don't occur every day but have happened past 2 days.  Denies any associated chest pain or SOB.  She hasn't noted HR readings greater than 120 bpm typically 80s at rest.  Plan to continue with exercise, monitoring for symptoms.    INR 02/14/17 3.1 Next check 5/22

## 2017-02-21 ENCOUNTER — Inpatient Hospital Stay: Payer: MEDICARE | Primary: Family Medicine

## 2017-02-26 ENCOUNTER — Inpatient Hospital Stay: Admit: 2017-02-26 | Payer: MEDICARE | Primary: Family Medicine

## 2017-02-26 ENCOUNTER — Ambulatory Visit: Admit: 2017-02-26 | Payer: MEDICARE | Primary: Family Medicine

## 2017-02-26 DIAGNOSIS — Z951 Presence of aortocoronary bypass graft: Secondary | ICD-10-CM | POA: Diagnosis not present

## 2017-02-26 DIAGNOSIS — I2510 Atherosclerotic heart disease of native coronary artery without angina pectoris: Secondary | ICD-10-CM | POA: Diagnosis not present

## 2017-02-26 LAB — GLUCOSE, POC: Glucose (POC): 90 mg/dL (ref 70–105)

## 2017-02-26 NOTE — Other (Signed)
Client has recently seen PCP on 02/19/17 with no reported changes in her medications. Will call for office visit note and will be reviewed accordingly.

## 2017-02-26 NOTE — Other (Signed)
Pt had finished exercise piece on recumbent bike and had 5 minute rest prior to going to second piece. Client reported feeling fatigued, 2/4 and lightheaded, and generalized weakness at 09:35. Initial bp 152/64, 82 hr and 97%.  Denied any complaints of chest pain, sob or palpitations. Denied any of her angina symptoms of chest tightness. Monitor showed Afib. Reports having breakfast at 6:30 consisting of cream of wheat, toast, sausage and coffee.Clientt states similar feelings/symptoms at home in which she lies down for an hour with resolution.Client has had a total of 6 episodes for this month.Client felt at one point it could be related to her diltiazem and changed time from am to pm, but feels that it has not made a difference.  Did see her pcp , Dr. Maurie Boettcherherriault 5/15 but did not make him aware. Note sent to cardiologist, Dr. Sharren BridgeBleakley on 5/15 making him aware of similar complaints. Client reports not hearing back from Dr.Bleakley's office. Blood sugar checked and results 90. Pt is not diabetic, did give her 4oz of orange juice at 0941 which did make her feel better. Did also have container of yogurt. Client reported feeling much better after 25-30 minutes. Repeat BP 136/72 AP 84 RR 20 at 09:41. Call placed to Dr. Barbie HaggisBleakley's office at 09:55 and spoke with Tina,RN. Relayed information of client's complaints and blood sugar of 90 and that after orange juice and yogurt symptoms resolved. I did pass on that a note was faxed to office on 5/15 describing similar complaints. Tina,RN will pass information to provider and get in contact with client. I did take client for cool down walk and she denied any complaints of fatigue, lightheadedness or chest pain. BP 130/80 AP 84 RR 16 and O2 sat 96%. Educated client to go home and rest prior to getting errands that need to be accomplished done and if she can put off until tomorrow even better. Requested client call cardiac rehab tomorrow with update. Client will not  be here 5/24 or 5/29 due to going away.

## 2017-02-26 NOTE — Progress Notes (Signed)
Call made out to Dr. Bonnita Hollowheriault's office requesting office visit note from 02/19/17. They were going to fax it over today.

## 2017-02-27 ENCOUNTER — Ambulatory Visit: Admit: 2017-02-27 | Payer: MEDICARE | Primary: Family Medicine

## 2017-02-27 DIAGNOSIS — I482 Chronic atrial fibrillation: Secondary | ICD-10-CM

## 2017-02-27 LAB — AMB POC PT/INR: INR POC: 2.5

## 2017-02-27 NOTE — Progress Notes (Signed)
INR THERAPEUTIC AT 2.5. RECEIVED A WEEKLY TOTAL OF 35MG . DENIES S&S OF BLEEDING OR THROMBOEMBOLISM. MEDS REVIEWED AND UPDATED FOR STOPPING MAGNESIUM OXIDE FOR A WEEK NOW AND STOPPING CINNULIN FOR 3 DAYS BUT WILL BE RESUMING CINNULI STARTING TODAY. DENIES DIETARY CHANGES. NO UPCOMING PROCEDURES, PT STILL GO TO CARDIAC REHAB 2X WEEKLY. PT IS TRAVELLING TO [place] FROM 05/24 TO 05/29. STOPPING THE MAGNESIUM AND RESUMING CINNULIN WILL CAUSE INR TO GO DOWN. WILL CHANGE DOSE TO HAVE PT TAKE 5MG  DAILY EX 7.5MG  WED. F/U IN  2 WEEKS

## 2017-02-28 ENCOUNTER — Inpatient Hospital Stay: Payer: MEDICARE | Primary: Family Medicine

## 2017-03-05 ENCOUNTER — Encounter: Payer: MEDICARE | Primary: Family Medicine

## 2017-03-07 ENCOUNTER — Inpatient Hospital Stay: Admit: 2017-03-07 | Payer: MEDICARE | Primary: Family Medicine

## 2017-03-07 DIAGNOSIS — I25810 Atherosclerosis of coronary artery bypass graft(s) without angina pectoris: Secondary | ICD-10-CM | POA: Diagnosis not present

## 2017-03-07 DIAGNOSIS — I5021 Acute systolic (congestive) heart failure: Secondary | ICD-10-CM | POA: Diagnosis not present

## 2017-03-07 DIAGNOSIS — Z951 Presence of aortocoronary bypass graft: Secondary | ICD-10-CM | POA: Diagnosis not present

## 2017-03-07 DIAGNOSIS — I2510 Atherosclerotic heart disease of native coronary artery without angina pectoris: Secondary | ICD-10-CM | POA: Diagnosis not present

## 2017-03-07 DIAGNOSIS — I4891 Unspecified atrial fibrillation: Secondary | ICD-10-CM | POA: Diagnosis not present

## 2017-03-08 ENCOUNTER — Encounter: Primary: Family Medicine

## 2017-03-12 ENCOUNTER — Encounter: Payer: MEDICARE | Primary: Family Medicine

## 2017-03-12 ENCOUNTER — Emergency Department: Admit: 2017-03-12 | Payer: MEDICARE | Primary: Family Medicine

## 2017-03-12 ENCOUNTER — Inpatient Hospital Stay
Admit: 2017-03-12 | Discharge: 2017-03-12 | Disposition: A | Discharge status: Home / Self Care | Payer: MEDICARE | Attending: Medical

## 2017-03-12 ENCOUNTER — Ambulatory Visit: Primary: Family Medicine

## 2017-03-12 DIAGNOSIS — S8991XA Unspecified injury of right lower leg, initial encounter: Secondary | ICD-10-CM

## 2017-03-12 DIAGNOSIS — Z7901 Long term (current) use of anticoagulants: Secondary | ICD-10-CM | POA: Diagnosis not present

## 2017-03-12 DIAGNOSIS — I2510 Atherosclerotic heart disease of native coronary artery without angina pectoris: Secondary | ICD-10-CM | POA: Diagnosis not present

## 2017-03-12 DIAGNOSIS — M810 Age-related osteoporosis without current pathological fracture: Secondary | ICD-10-CM | POA: Diagnosis not present

## 2017-03-12 DIAGNOSIS — M25551 Pain in right hip: Secondary | ICD-10-CM | POA: Diagnosis not present

## 2017-03-12 LAB — PROTHROMBIN TIME + INR
INR: 1.8 — ABNORMAL HIGH (ref 0.7–1.0)
Prothrombin time: 18.8 s — ABNORMAL HIGH (ref 9.7–11.4)

## 2017-03-12 MED ORDER — ACETAMINOPHEN 325 MG TABLET
325 mg | ORAL | Status: AC
Start: 2017-03-12 — End: 2017-03-12
  Administered 2017-03-12: 14:00:00 via ORAL

## 2017-03-12 MED FILL — TYLENOL 325 MG TABLET: 325 mg | ORAL | Qty: 2

## 2017-03-12 NOTE — ED Provider Notes (Signed)
Patient is a 71 y.o. female presenting with knee injury. The history is provided by the patient.   Knee Injury    The current episode started 1 to 2 hours ago. The pain is present in the right hip and right knee. Pertinent negatives include no numbness, no back pain and no neck pain.    Jaime Maxwell is a 71 year old female patient with a history of osteoporosis who presents today for evaluation of right hip and knee pain after a fall this morning.  She states that she was walking the dog.  His buttock.  She started to pull on the leash and then wound up falling onto her right side and hitting her knee.  She felt a pop just before this fall.  She does think a right knee may be a little bit swollen.  She is able to walk but states that it is painful.  She has been using crutches.  She has not taken any medication or used any ice for this.  As she did not hit her head.  She has no neck or back pain.  No loss of consciousness.  No nausea no vomiting no abdominal pain no chest pains palpitations tachycardia or shortness of breath. No numbness or tingling.  She has not noticed any erythema or ecchymosis of the affected joints.    Past Medical History:   Diagnosis Date   ??? CAD (coronary artery disease)        Past Surgical History:   Procedure Laterality Date   ??? CARDIAC SURG PROCEDURE UNLIST           No family history on file.    Social History     Social History   ??? Marital status: MARRIED     Spouse name: N/A   ??? Number of children: N/A   ??? Years of education: N/A     Occupational History   ??? Not on file.     Social History Main Topics   ??? Smoking status: Never Smoker   ??? Smokeless tobacco: Not on file   ??? Alcohol use No   ??? Drug use: No   ??? Sexual activity: Not on file     Other Topics Concern   ??? Not on file     Social History Narrative   ??? No narrative on file         ALLERGIES: Review of patient's allergies indicates no known allergies.    Review of Systems   Constitutional: Negative.    Respiratory: Negative.     Cardiovascular: Negative.    Musculoskeletal: Positive for arthralgias, gait problem and joint swelling. Negative for back pain, myalgias, neck pain and neck stiffness.   Skin: Negative.    Neurological: Negative for dizziness, tremors, syncope, facial asymmetry, speech difficulty, weakness, light-headedness, numbness and headaches.   Psychiatric/Behavioral: Negative.        Vitals:    03/12/17 0824   BP: 132/83   Pulse: (!) 104   Resp: 20   Temp: 98.8 ??F (37.1 ??C)   SpO2: 98%   Weight: 78.5 kg (173 lb)   Height: 5\' 2"  (1.575 m)            Physical Exam   Constitutional: She is oriented to person, place, and time. Vital signs are normal. She appears well-developed and well-nourished. She is active and cooperative.  Non-toxic appearance. She does not have a sickly appearance. She does not appear ill. No distress.   HENT:   Head: Normocephalic  and atraumatic. Head is without right periorbital erythema and without left periorbital erythema.   Right Ear: External ear normal.   Left Ear: External ear normal.   Nose: Nose normal.   Mouth/Throat: Oropharynx is clear and moist.   Eyes: Conjunctivae, EOM and lids are normal.   Neck: Trachea normal, normal range of motion and phonation normal. Neck supple. No Brudzinski's sign noted.   Cardiovascular: Normal rate, regular rhythm, S1 normal, S2 normal, normal heart sounds and intact distal pulses.    Pulmonary/Chest: Effort normal and breath sounds normal. No respiratory distress.   Abdominal: Normal appearance. There is no CVA tenderness.   Musculoskeletal: She exhibits edema and tenderness. She exhibits no deformity.        Right hip: She exhibits tenderness. She exhibits normal strength.        Right knee: She exhibits decreased range of motion, swelling and bony tenderness (Tenderness over the medial tibial plateau.). She exhibits no effusion, no ecchymosis, no deformity, no laceration, no erythema, normal  alignment, no LCL laxity, normal meniscus and no MCL laxity. Tenderness found. MCL tenderness noted.   Neurological: She is alert and oriented to person, place, and time. She has normal strength. GCS eye subscore is 4. GCS verbal subscore is 5. GCS motor subscore is 6.   Skin: Skin is warm, dry and intact. No rash noted. She is not diaphoretic. No erythema. There is pallor.   Psychiatric: She has a normal mood and affect. Her speech is normal and behavior is normal. Judgment and thought content normal. Cognition and memory are normal.   Nursing note and vitals reviewed.       MDM  Number of Diagnoses or Management Options  Knee injury, right, initial encounter: minor  Pain of right hip joint: minor     Amount and/or Complexity of Data Reviewed  Clinical lab tests: reviewed and ordered  Tests in the radiology section of CPT??: ordered and reviewed  Decide to obtain previous medical records or to obtain history from someone other than the patient: yes    Risk of Complications, Morbidity, and/or Mortality  Presenting problems: moderate  Diagnostic procedures: moderate  Management options: moderate    Critical Care  Total time providing critical care: < 30 minutes    Patient Progress  Patient progress: stable   Emmary is a 71 year old female patient who was walking her dog felt a pop in her right knee and then fell onto her right knee and hip approximately 1-2 hours ago while walking her dog.  His on exam she is in no acute distress. She has tenderness of her right medial tibial plateau.  She has mild discomfort with palpation of the right inguinal area.  She has an intact straight leg raise.  She has 5/5 strength in her lower extremity.  Distal pulses sensation and capillary refill are all intact.  There is no tenderness of her his calf.  Her anterior drawer test is negative. She has no laxity of the LCL or MCL.  There is no obvious internal or external rotation of her right leg.  She has full range of  motion of her neck.  She is mentating appropriately.  She appears grossly neurologically intact.  She has a regular rate and rhythm with no murmurs rubs or gallops.  Her breath sounds are equal and intact throughout.  As she did not hit her head does not have any neck or back pain has full range of motion of her C-spine is neurologically  intact and is mentating appropriately I do not feel that it is necessary to perform a CT scan of her and had her neck at this time.  We will perform x-rays of her right knee and hip.  She would like Tylenol for her discomfort.      ED Course   Value Comment By Time   XR KNEE RT 3 V X-ray reviewed by myself and Radiology showing a joint effusion of the right knee but no obvious fractures. Grant Ruts, PA-C 06/05 1003   XR HIP RT W OR WO PELV 2-3 VWS X-ray of the right hip reviewed by myself.  This shows no acute fractures or dislocations. Grant Ruts, PA-C 06/05 1004    I have discussed the x-ray results with the patient.  His at this time I have a low concern for fracture, dislocation of the knee hip or femur.  His as she is able to bear weight I think it is reasonable for her to be discharged home.  She is in agreement with this treatment plan.  We have discussed red flags and when to return to the emergency department.  She will follow up with her primary care physician.  As we have discussed red flags.  We discussed the use of ice and elevation.  Weightbearing as tolerated.  She already has crutches. Grant Ruts, PA-C 06/05 1017   INR: (!) 1.8 INR 1.8.  She will follow up with her primary care for this.  She will continue her dosing as prescribed. Grant Ruts, PA-C 06/05 1021       Procedures

## 2017-03-12 NOTE — Progress Notes (Signed)
PATIENT PRESENTED IN THE EMERGENCY ROOM THIS MORNING AS SHE WAS WALKING HER DOG AND WAS PULLED BY HIM AND SHE FELL ON HER RIGHT SIDE AND HURT HER RIGHT KNEE. INR ON ARRIVAL WAS 1.8/18.8 AND TOTAL WEEKLY DOSE IS 37.5 MG WEEKLY.  CALLED PATIENT AND SHE REPORTS THAT SHE MISSED WARFARIN 5 MG 3 DAYS AGO WHICH EXPLAINS THE DIP IN THE INR. PATIENT WAS GIVEN TYLENOL 650 MG TIMES 1 IN THE EMERGENCY ROOM AND WILL TAKE AN ADDITIONAL TYLENOL 650 TONIGHT AS HER RIGHT KNEE IS SWOLLEN AND PAINFUL. PATIENT DOESN'T ANTICIPATE USING TYLENOL AFTER TODAY, BUT WILL CALL IF ITS NECESSARY.   X-RAYS WERE NEGATIVE AND SHE IS USING CRUTCHES AND WAS INSTRUCTED TO NOT BEAR WEIGHT ON THE RIGHT LOWER EXT.  PATIENT INSTRUCTED TO INCREASE WARFARIN TO 5 MG DAILY EX 7.5 MG ON WED/FRI WITH F/U NEXT Tuesday. WILL MAKE UP 2.5 OF THE 5 MG DOSE MISSED AND THE TYLENOL WILL ALSO INCREASE THE INR. DSC

## 2017-03-14 ENCOUNTER — Inpatient Hospital Stay: Payer: MEDICARE | Primary: Family Medicine

## 2017-03-14 NOTE — Telephone Encounter (Signed)
Spoke to client on phone, states her right knee is still swollen and sore, using ice and elevation.  Using knee brace and crutches when walking.  Also with complaints of nagging lower abd pain to the right of bladder as reported in the ED.  No change in intensity and is not aggravated by walking.  She is using tylenol with benefit.  She has not called her PCP since ED visit.  Recommended she call PCP today to follow up after ED evaluation reporting symptoms she still continues to experience.  She is in agreement.  Plan to update with her next Monday on the phone.

## 2017-03-14 NOTE — Telephone Encounter (Signed)
Chart review indicates client in the ED 03/12/17 after fall while walking dog, right hip and knee pain, x-rays negative for fracture.  To F/U with PCP.  Message left for client to call CR with plans to defer CR today given recent injury.

## 2017-03-19 ENCOUNTER — Ambulatory Visit: Admit: 2017-03-19 | Payer: MEDICARE | Primary: Family Medicine

## 2017-03-19 ENCOUNTER — Inpatient Hospital Stay: Payer: MEDICARE | Primary: Family Medicine

## 2017-03-19 DIAGNOSIS — I482 Chronic atrial fibrillation: Secondary | ICD-10-CM

## 2017-03-19 DIAGNOSIS — S8391XA Sprain of unspecified site of right knee, initial encounter: Secondary | ICD-10-CM | POA: Diagnosis not present

## 2017-03-19 LAB — AMB POC PT/INR: INR POC: 3.5

## 2017-03-19 NOTE — Progress Notes (Signed)
INR SL ELEVATED  AT 3.5 .  RECEIVED WEEKLY DOSE OF 40MG . DOSE ADJUSTED FOR MISSING A DOSE. DENIES ANY S&S OF BLEEDING OR THROMBOEMBOLISM. MEDICATIONS REVIEWED AND UPDATED. PT DID NOT HAVE HER DAILY 1/2 CAN ENSURE FOR DAYS NOW WHICH CAUSED HER INR TO BE ELEVATED. PT WILL BE RESUMING HER ENSURE STARTING TODAY. PT ALSO STOPPED USING THE OLIVE LEAF EXTRACT FOR 2 WEEKS NOW. PT HAD 2 TYLENOL THIS PAST SUN BUT WILL NOT BE USING IT ANYMORE. PT WILL BE HAVING PHYSICAL THERAPY FOR THE PAIN TO HER RIGHT KNEE WHICH SHE SUSTAINED AFTER FALLING LAST WEEK AND WENT TO THE ED WITH XRAYS /TEST NEGATIVE. DENIES DIETARY CHANGES. DENIES ANY PROCEDURES OR TRAVEL PLANS. SINCE PT IS RESUMING ENSURE,  WILL NOT BE  TAKING TYLENOL AND STOPPING THE OLIVE EXTRACT WIILL MAKE HER INR GO DOWN, WILL HAVE PT CONTINUE 5MG  DAILY EX 7.5MG  WED. F/U IN 2 WEEKS.  PT AGREED TO THE PLAN.

## 2017-03-19 NOTE — Telephone Encounter (Signed)
Phone message left requesting call back with update after previously noted fall.

## 2017-03-21 NOTE — Other (Signed)
Client stopped by dept on 03/19/17 walking with a crutch indicating that she is undergoing PT for injured knee.  Client will be discharged from [facility] given recent orthopedic injury.

## 2017-03-22 ENCOUNTER — Inpatient Hospital Stay: Payer: MEDICARE | Primary: Family Medicine

## 2017-03-25 DIAGNOSIS — M25561 Pain in right knee: Secondary | ICD-10-CM | POA: Diagnosis not present

## 2017-03-25 DIAGNOSIS — R531 Weakness: Secondary | ICD-10-CM | POA: Diagnosis not present

## 2017-03-25 DIAGNOSIS — I25119 Atherosclerotic heart disease of native coronary artery with unspecified angina pectoris: Secondary | ICD-10-CM | POA: Diagnosis not present

## 2017-03-25 DIAGNOSIS — M25661 Stiffness of right knee, not elsewhere classified: Secondary | ICD-10-CM | POA: Diagnosis not present

## 2017-03-26 ENCOUNTER — Inpatient Hospital Stay: Payer: MEDICARE | Primary: Family Medicine

## 2017-03-26 DIAGNOSIS — I4891 Unspecified atrial fibrillation: Secondary | ICD-10-CM | POA: Diagnosis not present

## 2017-03-26 DIAGNOSIS — I493 Ventricular premature depolarization: Secondary | ICD-10-CM | POA: Diagnosis not present

## 2017-03-27 DIAGNOSIS — M25561 Pain in right knee: Secondary | ICD-10-CM | POA: Diagnosis not present

## 2017-03-27 DIAGNOSIS — R531 Weakness: Secondary | ICD-10-CM | POA: Diagnosis not present

## 2017-03-27 DIAGNOSIS — M25661 Stiffness of right knee, not elsewhere classified: Secondary | ICD-10-CM | POA: Diagnosis not present

## 2017-03-28 ENCOUNTER — Inpatient Hospital Stay: Payer: MEDICARE | Primary: Family Medicine

## 2017-04-01 DIAGNOSIS — M25661 Stiffness of right knee, not elsewhere classified: Secondary | ICD-10-CM | POA: Diagnosis not present

## 2017-04-01 DIAGNOSIS — M25561 Pain in right knee: Secondary | ICD-10-CM | POA: Diagnosis not present

## 2017-04-01 DIAGNOSIS — R531 Weakness: Secondary | ICD-10-CM | POA: Diagnosis not present

## 2017-04-02 ENCOUNTER — Encounter: Payer: MEDICARE | Primary: Family Medicine

## 2017-04-02 ENCOUNTER — Ambulatory Visit: Admit: 2017-04-02 | Payer: MEDICARE | Primary: Family Medicine

## 2017-04-02 ENCOUNTER — Inpatient Hospital Stay: Admit: 2017-04-02 | Payer: MEDICARE | Primary: Family Medicine

## 2017-04-02 DIAGNOSIS — I482 Chronic atrial fibrillation, unspecified: Secondary | ICD-10-CM

## 2017-04-02 LAB — PROTHROMBIN TIME + INR
INR: 4 — ABNORMAL HIGH (ref 0.7–1.0)
Prothrombin time: 40.9 s — ABNORMAL HIGH (ref 9.7–11.4)

## 2017-04-02 LAB — AMB POC PT/INR: INR POC: 4.4

## 2017-04-02 NOTE — Progress Notes (Signed)
INR Supratherapeutic AT 4.4 VIA [facility] MACHINE ON 35 MG FOR THE WEEK.PT TOOK A 5MG  TAB LAST WEEK ON WED NOT THE 7.5MG  AS INSTRUCTED AS SHE DOES NOT LIKE TO BRUISE. RAN OUT OF CO Q 10 A FEW DAYS AGO BUT WILL BUY TODAY AND RE-START.ALSO USED ADVIL ONE DAY FOR PAIN.DENIES S/S OF BLEEDING AND THROMBOEMBOLISM.FEELS SHE HAD LESS VEG THIS WEEK.ALSO ADDED COD LIVER OIL TO DIET.DENIES TRAVEL PLANS.DENIES UPCOMING PROCEDURES. MEDS REVIEWED AND UPDATED.SENT TO LAB FOR INR. WILL CALL PT WITH INSTRUCTION AT [phone].APPT MADE FOR 1 WEEK ON JULY 3 FOR NEXT INR.WILL DECREASE WARFARIN DOSING BASED ON LAB .

## 2017-04-02 NOTE — Progress Notes (Signed)
INR VIA LAB DRAW WAS 4.0/40.9. TOTAL WEEKLY DOE WAS 35 MG. INSTRUCTED PATIENT TO DECREASE TONIGHT'S DOSE FORM 5 MG TO 2.5 MG AND THEN TAKE 5 MG DAILY UNTIL F/U NEXT Tuesday. TOTAL WEEKLY DOSE WILL BE 32.5 MG.  PATIENT AGREES WITH THE PLAN OF CARE AND FEELS THAT SHE WILL NOT EAT MORE GREENS THAN SHE CURRENTLY IS.

## 2017-04-04 ENCOUNTER — Encounter: Payer: MEDICARE | Primary: Family Medicine

## 2017-04-05 DIAGNOSIS — R531 Weakness: Secondary | ICD-10-CM | POA: Diagnosis not present

## 2017-04-05 DIAGNOSIS — M25661 Stiffness of right knee, not elsewhere classified: Secondary | ICD-10-CM | POA: Diagnosis not present

## 2017-04-05 DIAGNOSIS — M25561 Pain in right knee: Secondary | ICD-10-CM | POA: Diagnosis not present

## 2017-04-09 ENCOUNTER — Ambulatory Visit: Admit: 2017-04-09 | Payer: MEDICARE | Primary: Family Medicine

## 2017-04-09 ENCOUNTER — Encounter: Payer: MEDICARE | Primary: Family Medicine

## 2017-04-09 DIAGNOSIS — I482 Chronic atrial fibrillation: Secondary | ICD-10-CM

## 2017-04-09 DIAGNOSIS — M25661 Stiffness of right knee, not elsewhere classified: Secondary | ICD-10-CM | POA: Diagnosis not present

## 2017-04-09 DIAGNOSIS — R531 Weakness: Secondary | ICD-10-CM | POA: Diagnosis not present

## 2017-04-09 DIAGNOSIS — M25561 Pain in right knee: Secondary | ICD-10-CM | POA: Diagnosis not present

## 2017-04-09 LAB — AMB POC PT/INR: INR POC: 1.7

## 2017-04-09 NOTE — Progress Notes (Signed)
INR Subtherapeutic AT 1.7 ON 25 MG FOR THE WEEK.PT TOOK MEDICATION AS INSTRUCTED.DENIES S/S OF BLEEDING AND THROMBOEMBOLISM BUT HAD A SMALL CUT ON HER ARM THAT TOOK 20 MIN TO STOP BLEDING WHICH CONCERNED HER.DENIES DIETARY CHANGES.DENIES TRAVEL PLANS.DENIES UPCOMING PROCEDURES. MEDS REVIEWED AND STOPPED MONTELUCAST ABOUT 2 WEEKS AGO AS "SHE NO LONGER HAS BREATHING PROBLEMS', ALSO STOPPED MAGNESIUM AS IT WONT FIT IN PILL BOX AND STOPPED OLIVE LEAF AS SHE JUST DECIDED TO. THESE MAY HAVE AFFECTED INR BUT NO CHANGE IN 2 WEEKS WITH THESE MEDS.TOOK 5MG  ON 6/26 BY MISTAKE, SO HELD 6/27 AND ON 6/28 HER CUT BLEED A LOT SO SHE DECIDED TO HOLD ON HER OWN.ADVISED TO CALL CLINIC IN THE FUTURE FOR GUIDANCE AND INSTRUCTION FOR MISSED DOSES OR RRORS.WILL CHANGE WARFARIN DOSING TO 7.5MG  TODAY ONLY THEN 5 MG DAILY. F/U IN 1 WEEK. PT UNDERSTANDS INSTRUCTION.

## 2017-04-11 ENCOUNTER — Encounter: Payer: MEDICARE | Primary: Family Medicine

## 2017-04-12 DIAGNOSIS — R531 Weakness: Secondary | ICD-10-CM | POA: Diagnosis not present

## 2017-04-12 DIAGNOSIS — M25661 Stiffness of right knee, not elsewhere classified: Secondary | ICD-10-CM | POA: Diagnosis not present

## 2017-04-12 DIAGNOSIS — M25561 Pain in right knee: Secondary | ICD-10-CM | POA: Diagnosis not present

## 2017-04-15 DIAGNOSIS — E119 Type 2 diabetes mellitus without complications: Secondary | ICD-10-CM | POA: Diagnosis not present

## 2017-04-15 DIAGNOSIS — M25661 Stiffness of right knee, not elsewhere classified: Secondary | ICD-10-CM | POA: Diagnosis not present

## 2017-04-15 DIAGNOSIS — D649 Anemia, unspecified: Secondary | ICD-10-CM | POA: Diagnosis not present

## 2017-04-15 DIAGNOSIS — I10 Essential (primary) hypertension: Secondary | ICD-10-CM | POA: Diagnosis not present

## 2017-04-15 DIAGNOSIS — R531 Weakness: Secondary | ICD-10-CM | POA: Diagnosis not present

## 2017-04-15 DIAGNOSIS — M25561 Pain in right knee: Secondary | ICD-10-CM | POA: Diagnosis not present

## 2017-04-16 ENCOUNTER — Encounter: Payer: MEDICARE | Primary: Family Medicine

## 2017-04-16 ENCOUNTER — Ambulatory Visit: Admit: 2017-04-16 | Payer: MEDICARE | Primary: Family Medicine

## 2017-04-16 DIAGNOSIS — I482 Chronic atrial fibrillation, unspecified: Secondary | ICD-10-CM

## 2017-04-16 LAB — AMB POC PT/INR: INR POC: 1.6

## 2017-04-16 NOTE — Progress Notes (Signed)
INR Subtherapeutic AT 1.6 ON 32.5 MG FOR THE WEEK.THINKS SHE MAY HAVE MISSED A DOSE Friday OF 5MG  AS SHE HAD COMPANY THIS WEEK.DENIES S/S OF BLEEDING AND THROMBOEMBOLISM.DENIES DIETARY CHANGES.DENIES TRAVEL PLANS.DENIES UPCOMING PROCEDURES. MEDS REVIEWED AND UPDATED.WILL HAVE HER TAKE A ONE TIME 10MG  DOSE TO MAKE UP FOR LIKELY MISSED DOSE AS VERY LOW WHICH IS CONCERNING.DISCUSSED RISKS OF CVA WITH LOW INR AND TO GO TO ED IF ANY S/S CLOTTING OR CVA. THEN CONTINUE WARFARIN DOSING OF 5 MG DAILY. F/U IN 3 DAYS. PT UNDERSTANDS INSTRUCTION.

## 2017-04-18 ENCOUNTER — Encounter: Payer: MEDICARE | Primary: Family Medicine

## 2017-04-18 DIAGNOSIS — M25661 Stiffness of right knee, not elsewhere classified: Secondary | ICD-10-CM | POA: Diagnosis not present

## 2017-04-18 DIAGNOSIS — R531 Weakness: Secondary | ICD-10-CM | POA: Diagnosis not present

## 2017-04-18 DIAGNOSIS — M25561 Pain in right knee: Secondary | ICD-10-CM | POA: Diagnosis not present

## 2017-04-19 ENCOUNTER — Encounter: Primary: Family Medicine

## 2017-04-19 NOTE — Telephone Encounter (Signed)
CALL MADE TO RESCHEDULE MISSED APPT. LEFT MESSAGE TO CALL CLINIC TO RESCHEDULE.

## 2017-04-22 DIAGNOSIS — S8391XA Sprain of unspecified site of right knee, initial encounter: Secondary | ICD-10-CM | POA: Diagnosis not present

## 2017-04-23 ENCOUNTER — Telehealth

## 2017-04-23 ENCOUNTER — Encounter: Payer: MEDICARE | Primary: Family Medicine

## 2017-04-24 DIAGNOSIS — R531 Weakness: Secondary | ICD-10-CM | POA: Diagnosis not present

## 2017-04-24 DIAGNOSIS — M25561 Pain in right knee: Secondary | ICD-10-CM | POA: Diagnosis not present

## 2017-04-24 DIAGNOSIS — M25661 Stiffness of right knee, not elsewhere classified: Secondary | ICD-10-CM | POA: Diagnosis not present

## 2017-04-25 ENCOUNTER — Encounter

## 2017-04-25 ENCOUNTER — Encounter: Payer: MEDICARE | Primary: Family Medicine

## 2017-04-26 NOTE — Telephone Encounter (Signed)
-----   Message from [place] sent at 04/26/2017 11:27 AM EDT -----  Contact: [phone]  Call pt to r/s

## 2017-04-26 NOTE — Telephone Encounter (Signed)
CALLED PT AT HOME TO RESCHEDULE APPOINTMENT. LEFT MESSAGE ON HER VOICE MAIL TO CALL [facility].

## 2017-04-30 ENCOUNTER — Encounter: Payer: MEDICARE | Primary: Family Medicine

## 2017-04-30 DIAGNOSIS — M25661 Stiffness of right knee, not elsewhere classified: Secondary | ICD-10-CM | POA: Diagnosis not present

## 2017-04-30 DIAGNOSIS — M25561 Pain in right knee: Secondary | ICD-10-CM | POA: Diagnosis not present

## 2017-04-30 DIAGNOSIS — R531 Weakness: Secondary | ICD-10-CM | POA: Diagnosis not present

## 2017-05-02 ENCOUNTER — Encounter: Payer: MEDICARE | Primary: Family Medicine

## 2017-05-03 ENCOUNTER — Ambulatory Visit: Admit: 2017-05-03 | Payer: MEDICARE | Primary: Family Medicine

## 2017-05-03 DIAGNOSIS — I482 Chronic atrial fibrillation: Secondary | ICD-10-CM

## 2017-05-03 LAB — AMB POC PT/INR: INR POC: 3.9

## 2017-05-03 NOTE — Progress Notes (Signed)
INR ELEVATED  AT 3.9. RECEIVED WEEKLY DOSE OF 35MG . DENIES ANY S&S OF BLEEDING OR THROMBOEMBOLISM. MEDICATIONS REVIEWED AND UPDATED FOR STARTING COD LIVER OIL1 TSP DAILY FOR 2 WEEKS NOW WHICH CAN EFFECT INR TO RISE.CARVEDILOL 12.5MG  2 TABS BID FROM 3 TAB BID, WHICH PT DECIDED TO ADJUST DOSE ON HER OWN DUE TO LOW BLOOD PRESSURE. HAS NOT TAKEN THE DOCUSATE FOR OVER A WEEK, PT ALSO STOPPED THE FLUTICASONE INHALER  FOR 3 WEEKS NOW. ALSO RAN OUT OF ALOE VERA EXTRACT FOR OVER A WEEK AND WILL BE RESUMING IT WHEN SHE FINDS IT IN THE STORE. ALSO, SHE REPLACED SINGULAIR TO LORATADINE 10MG  DAILY FOR 2 WEEKS NOW. DENIES DIETARY CHANGES, HOWEVER SHE HAD 3 DAYS LAST WEEK OF HAVING CHERRIES AND WILL CONTINUE TO  HAVE THEM IF SHE FINDS THEM IN THE STORE. DENIES ANY PROCEDURES OR TRAVEL PLANS. WILL CHANGE DOSE TO HAVE PT TAKE 2.5MG  TODAY THEN 5MG   DAILY EX 2.5MG  FRI. F/U IN 1 WEEK DUE TO MULTIPLE MED CHANGES.  PT AGREED TO THE PLAN.

## 2017-05-07 ENCOUNTER — Encounter: Payer: MEDICARE | Primary: Family Medicine

## 2017-05-09 ENCOUNTER — Ambulatory Visit: Admit: 2017-05-09 | Discharge: 2017-05-09 | Payer: MEDICARE | Attending: Adult Health | Primary: Family Medicine

## 2017-05-09 ENCOUNTER — Encounter: Primary: Family Medicine

## 2017-05-09 ENCOUNTER — Encounter: Payer: MEDICARE | Primary: Family Medicine

## 2017-05-09 DIAGNOSIS — I482 Chronic atrial fibrillation: Secondary | ICD-10-CM

## 2017-05-09 LAB — AMB POC PT/INR: INR POC: 2.1

## 2017-05-09 NOTE — Progress Notes (Signed)
INR Therapeutic AT 2.1, RECEIVED WKLY DOSE OF 32.5MG  FOR THE WK. DENIED ANY S&S OF BLEEDING OR THROMBOEMBOLISM. DENIED ANY DIETARY OR MED CHANGES. THOUGH HAVING MANY CUCUMBERS ON TOP OF HER USUAL EATING OF 2 VEGS DAILY. ALSO UPON REVIEW OF HER MEDS, SHE IS OFF OLIVE LEAVE EXTRACT AND NEED TO REFILL HER ADRENAL SUPPORT PILLS. I REITERATED THAT HER INRS HAVE BEEN QUITE ERRATIC DUE TO HER ERRATIC USE OF HER OTC MEDS. I HOPE SHE HAS BETTER UNDERSTANDING THAT SHE SHOULD KEEP ALL HER PRESCRIBED AND OTC MEDS/SUPPLEMENTS CONSISTENT TO ACHIEVE SOME CONSISTENCY IN HER INRS TO PREVENT A/E. MEDS REVIEWED AND UPDATED.  DENIED ANY ANTICIPATED PROCEDURES OR TRIPS. WILL HAVE PT TAKE 7.5MG  TONIGHT ONLY AND THEN 5MG  DAILY AND F/U IN 2 WKS. PT AMENDABLE TO PLAN.

## 2017-05-14 ENCOUNTER — Encounter: Payer: MEDICARE | Primary: Family Medicine

## 2017-05-16 ENCOUNTER — Encounter: Payer: MEDICARE | Primary: Family Medicine

## 2017-05-21 ENCOUNTER — Encounter: Payer: MEDICARE | Primary: Family Medicine

## 2017-05-22 ENCOUNTER — Ambulatory Visit: Admit: 2017-05-22 | Payer: MEDICARE | Attending: Adult Health | Primary: Family Medicine

## 2017-05-22 DIAGNOSIS — I482 Chronic atrial fibrillation: Secondary | ICD-10-CM

## 2017-05-22 LAB — AMB POC PT/INR: INR POC: 3.3

## 2017-05-22 NOTE — Progress Notes (Signed)
INR OK AT 3.3 AND UP FROM 2.1.   RECEIVED WEEKLY DOSE OF 35MG  WHICH WAS ADJUSTED AT LAST VISIT DUE TO INR 2.1.  DENIES ANY S&S OF BLEEDING OR THROMBOEMBOLISM. APPETITE IS GOOD AND DENIES ANY CHANGES TO HER DIET, CONTINUES TO HAVE 2 SERVINGS OF VEGETABLES A DAY INCLUDING BROCCOLI AND SPINACH. HAVING BRUSSEL SPROUTS TONIGHT.  MEDICATIONS REMAIN THE SAME AS VISIT 05/09/2017.  PT CONTINUES TO BE OFF THE OLIVE OIL EXTRACT AND ADRENAL SUPPORT SUPPLEMENT.   DENIES ANY MEDICAL PROCEDURES OR TRAVEL PLANS. POSSIBLY GOING TO [place] TO SEE A FRIEND BUT JUST OVER NIGHT. PT'S GRAND-DAUGHTER IS IN THE HOSPITAL IN [place] AND SHE MAY END UP GOING TO VISIT BUT SHE IS NOT SURE. EMOTIONAL SUPPORT GIVEN.  WILL HAVE PT CONTINUE 5MG  DAILY.  F/U IN 2 WEEKS.  TIME SPENT 15 MINUTES.

## 2017-05-23 ENCOUNTER — Encounter: Attending: Adult Health | Primary: Family Medicine

## 2017-05-23 ENCOUNTER — Encounter: Payer: MEDICARE | Primary: Family Medicine

## 2017-05-28 ENCOUNTER — Encounter: Payer: MEDICARE | Primary: Family Medicine

## 2017-05-30 ENCOUNTER — Encounter: Payer: MEDICARE | Primary: Family Medicine

## 2017-06-04 ENCOUNTER — Encounter: Payer: MEDICARE | Primary: Family Medicine

## 2017-06-05 ENCOUNTER — Ambulatory Visit: Admit: 2017-06-05 | Payer: MEDICARE | Primary: Family Medicine

## 2017-06-05 DIAGNOSIS — I482 Chronic atrial fibrillation: Secondary | ICD-10-CM

## 2017-06-05 LAB — AMB POC PT/INR: INR POC: 3.8

## 2017-06-05 NOTE — Progress Notes (Signed)
INR SLIGHTLY ELEVATED AT 3.8 TOTAL WEEKLY DOSE IS 35 MGS. DENIES S/S OF BLEEDING OR THROMBOEMBOLISM. MEDICATION REVIEWED AND CURRENT.   PATIENT BY MISTAKE SWALLOWED 3 MOUTHFULS OF  ALL LAUNDRY DETERGENT.  PATIENT CALLED POISON CONTROL AND WAS INSTRUCTED TO DRINK WATER.  PATIENT ALSO HAS BEEN TAKING METAMUCIL TWICE DAILY AND WILL CONT UNTIL SHE FEELS THAT SHE EXPELLED ALL THE CHEMICAL IN THE LAUNDRY DETERGENT.  THIS WOULD BE THE CAUSE OF THE ELEVATED INR. WILL DECREASE WEEKLY DOSE BY 2.5 MG AS LONG AS SHE IS TAKING THE METAMUCIL.  PATIENT HAS BEEN OUT OF THE ADRENAL SUPPORT FOR 3 WEEKS AND WILL NOT BE RESUMING. DENIES DIETARY CHANGES. NO PLANS TO TRAVEL AND NO INVASIVE PROCEDURES SCHEDULED.  WILL CHANGE CURRENT WARFARIN DOSING TO 5 MG DAILY EX 2.5 MG ON WED  WITH F/U IN 1 WEEKS.

## 2017-06-06 ENCOUNTER — Encounter: Payer: MEDICARE | Primary: Family Medicine

## 2017-06-11 ENCOUNTER — Encounter: Payer: MEDICARE | Primary: Family Medicine

## 2017-06-12 ENCOUNTER — Ambulatory Visit: Admit: 2017-06-12 | Payer: MEDICARE | Primary: Family Medicine

## 2017-06-12 DIAGNOSIS — I482 Chronic atrial fibrillation: Secondary | ICD-10-CM

## 2017-06-12 LAB — AMB POC PT/INR: INR POC: 2.4

## 2017-06-12 NOTE — Progress Notes (Signed)
INR THERAPEUTIC AT 2.4 TOTAL WEEKLY DOSE IS 32.5 MGS. DENIES S/S OF BLEEDING OR THROMBOEMBOLISM. MEDICATION REVIEWED AND CURRENT. DENIES DIETARY CHANGES. NO PLANS TO TRAVEL AND NO INVASIVE PROCEDURES SCHEDULED.  WILL CONT CURRENT WARFARIN DOSING OF 5 MG DAILY EX 2.5 MG ON WED WITH F/U IN 2 WEEKS.

## 2017-06-13 ENCOUNTER — Encounter: Payer: MEDICARE | Primary: Family Medicine

## 2017-06-18 ENCOUNTER — Encounter: Payer: MEDICARE | Primary: Family Medicine

## 2017-06-26 ENCOUNTER — Ambulatory Visit: Admit: 2017-06-26 | Payer: MEDICARE | Primary: Family Medicine

## 2017-06-26 DIAGNOSIS — I482 Chronic atrial fibrillation: Secondary | ICD-10-CM

## 2017-06-26 LAB — AMB POC PT/INR: INR POC: 2.8

## 2017-06-26 NOTE — Progress Notes (Signed)
INR THERAPEUTIC AT 2.8. RECEIVED WEEKLY DOSE OF 32.5MG . DENIES ANY S&S OF BLEEDING OR THROMBOEMBOLISM EXCEPT FOR SOME BRUISING TO LEFT FOREARM AND RIGHT ARM. MEDICATIONS THE SAME AS VISIT 09/05. DENIES DIETARY CHANGES. DENIES ANY PROCEDURES OR TRAVEL PLANS. WILL HAVE PT CONTINUE 5MG  DAILY EX 2.5MG  WED. F/U IN 3 WEEKS.  PT AGREED TO THE PLAN.

## 2017-07-17 ENCOUNTER — Ambulatory Visit: Admit: 2017-07-17 | Payer: MEDICARE | Primary: Family Medicine

## 2017-07-17 DIAGNOSIS — I482 Chronic atrial fibrillation: Secondary | ICD-10-CM

## 2017-07-17 LAB — AMB POC PT/INR: INR POC: 2.1

## 2017-07-17 NOTE — Progress Notes (Signed)
PT HERE BY SELF. INR Therapeutic AT 2.1  LOWER IN HER RANGE. PT HAS NOT TAKEN HER CINNAMON AND RED YEAST RICE SINCE LAST VISIT WHICH EXPLAINS THE LOWER INR. SHE WILL RESUME IT BACK. I RE-EDUCATED HER THAT HER INR IS IMPACTED BY EVERYTHING SHE DOES OR DOES NOT. SHE WILL DO BETTER TO BE CONSISTENT. , RECEIVED WKLY DOSE OF 32.5MG  . DENIED ANY S&S OF BLEEDING OR THROMBOEMBOLISM. DENIED ANY DIETARY OR MED CHANGES. MEDS REVIEWED AND UPDATED TODAY. DENIED ANY ANTICIPATED PROCEDURES OR TRIPS. WILL HAVE PT TAKE  DAILY EX 2.5MG  SUNDAYS . SO, THIS WK  AND THEN NEXT WK BACK TO 32.5MG  THAT HAS WORKED WELL WHEN SHE WAS TAKING ALL HER MEDS AND SUPPLEMENTS CONSISTENTLY. PT UNDERSTANDS &  AMENDABLE TO PLAN.

## 2017-07-29 DIAGNOSIS — L821 Other seborrheic keratosis: Secondary | ICD-10-CM | POA: Diagnosis not present

## 2017-07-29 DIAGNOSIS — L282 Other prurigo: Secondary | ICD-10-CM | POA: Diagnosis not present

## 2017-07-29 DIAGNOSIS — D2371 Other benign neoplasm of skin of right lower limb, including hip: Secondary | ICD-10-CM | POA: Diagnosis not present

## 2017-07-29 DIAGNOSIS — D2372 Other benign neoplasm of skin of left lower limb, including hip: Secondary | ICD-10-CM | POA: Diagnosis not present

## 2017-07-29 DIAGNOSIS — L818 Other specified disorders of pigmentation: Secondary | ICD-10-CM | POA: Diagnosis not present

## 2017-07-29 DIAGNOSIS — L700 Acne vulgaris: Secondary | ICD-10-CM | POA: Diagnosis not present

## 2017-07-30 ENCOUNTER — Encounter

## 2017-07-30 ENCOUNTER — Inpatient Hospital Stay: Admit: 2017-07-30 | Payer: MEDICARE | Primary: Family Medicine

## 2017-07-30 ENCOUNTER — Ambulatory Visit: Admit: 2017-07-30 | Discharge: 2017-07-30 | Payer: MEDICARE | Primary: Family Medicine

## 2017-07-30 DIAGNOSIS — Z5181 Encounter for therapeutic drug level monitoring: Secondary | ICD-10-CM

## 2017-07-30 DIAGNOSIS — I482 Chronic atrial fibrillation, unspecified: Secondary | ICD-10-CM

## 2017-07-30 DIAGNOSIS — Z7901 Long term (current) use of anticoagulants: Secondary | ICD-10-CM | POA: Diagnosis not present

## 2017-07-30 DIAGNOSIS — E119 Type 2 diabetes mellitus without complications: Secondary | ICD-10-CM | POA: Diagnosis not present

## 2017-07-30 DIAGNOSIS — R635 Abnormal weight gain: Secondary | ICD-10-CM | POA: Diagnosis not present

## 2017-07-30 DIAGNOSIS — I10 Essential (primary) hypertension: Secondary | ICD-10-CM | POA: Diagnosis not present

## 2017-07-30 DIAGNOSIS — W540XXA Bitten by dog, initial encounter: Secondary | ICD-10-CM | POA: Diagnosis not present

## 2017-07-30 DIAGNOSIS — S61252A Open bite of right middle finger without damage to nail, initial encounter: Secondary | ICD-10-CM | POA: Diagnosis not present

## 2017-07-30 LAB — PROTHROMBIN TIME + INR
INR: 4.2 — ABNORMAL HIGH (ref 0.7–1.0)
Prothrombin time: 42.7 s — ABNORMAL HIGH (ref 9.7–11.4)

## 2017-07-30 LAB — AMB POC PT/INR: INR POC: 5.1

## 2017-07-30 NOTE — Progress Notes (Signed)
PT HERE BY SELF. INR Supratherapeutic AT 5.1 VIA THE [facility] MACHINE. RECEIVED WKLY DOSE OF 32.5MG  . PT INFORMS ME THAT SHE HAS BEEN TRYING TO LOSE WT SO SHE STARTED ON GARCINIA CAMBOGIA. I LOOK UP THE INTERACTION W WARFARIN AND IT DOES INDICATE THAT IT WILL CAUSE BLEEDING. WHICH EXPLAINS THE RESON WHY HER INR IS ELEVATED. PT DID NOT CALL CLINIC W THE CHANGE. I REITERATED THAT SHE NEEDS TO CALL W ANY CHANGES . RE-EDUCATED PT THAT WARFARIN INTERACTS WITH A LOT OF PRESRIBED AND OTC MEDS, ETC. PT ALSO STATES THAT SHE GOT BITE BY A DOG 2 DAYS AGO AND SHE DID SEE HER PCP DR THERIAULT WHO PRESCRIBED ABX. I CALL [facility] AND I WAS INFORMED THAT IT WILL BE AUGMENTIN 875/125MG  BID X 7 DAYS . LT MIDDLE FINGER SWOLLEN, INFLAMMED W ERYTHEMA NOTED AND A FEW CLOSED BITE MARK. I QUESTION WHEN SHE HAD HER TETANIS INJ. PT DID NOT KNOW, SHE WILL F/U W HER PCP, IF IN QUESTION, SHE SHOULD GET IT.  DENIED ANY S&S OF BLEEDING OR THROMBOEMBOLISM. DENIED ANY DIETARY OR MED CHANGES. MEDS REVIEWED AND UPDATED TODAY.  PT ALSO WENT TO DERMATOLOGIST YESTERDAY AND WILL BE APPLYING ERYTHROMYCIN OINTMENT TO [facility]. DENIED ANY ANTICIPATED PROCEDURES OR TRIPS. SENT PT TO THE LAB FOR A REPEAT INR AND IS 4.2, I THEN F/U W PT AND INSTRUCTED PT TO DECR HER WARFARIN DOSING OF 5MG  DAILY EX 2.5MG  SU/TU/WED AND F/U IN ONE WK DUE TO MULTIPLE CHANGES. ALSO, IF PT GETS A TETANUS INJ, SHE NEEDS TO HAVE A SERVING OF DARK GREEN VEGS. PT UNDERSTANDS & AMENDABLE TO PLAN. .Marland Kitchen

## 2017-08-06 ENCOUNTER — Ambulatory Visit: Admit: 2017-08-06 | Payer: MEDICARE | Primary: Family Medicine

## 2017-08-06 DIAGNOSIS — I482 Chronic atrial fibrillation: Secondary | ICD-10-CM

## 2017-08-06 LAB — AMB POC PT/INR: INR POC: 3.2

## 2017-08-06 NOTE — Progress Notes (Signed)
INR VERY SLIGHTLY Supratherapeutic AT 3.2 ON 27.5 MG FOR THE WEEK.PT TOOK MEDICATION AS INSTRUCTED.DENIES S/S OF BLEEDING AND THROMBOEMBOLISM.DENIES DIETARY CHANGES.DENIES TRAVEL PLANS.DENIES UPCOMING PROCEDURES. MEDS REVIEWED AND UPDATED, FINISHED ANTIBIOTIC TODAY, WAS ON IT A WEEK.WILL START A PROBIOTIC TODAY WHICH SHE WILL TAKE EVERYDAY.THIS WILL RAISE HER.WILL DECREASE SLIGHTLY TO WARFARIN DOSING OF 2.5 MG DAILY EXCEPT 5 ON MWF. TOTAL OF 25MG  FOR THE WEEK.F/U IN 10 DAYS TO SEE HOW MUCH PROBIOTIC AFFECTS INR.Marland Kitchen. PT UNDERSTANDS INSTRUCTION. TOLD TO CALL US IF SHE STOPS THE PROBIOTIC.

## 2017-08-15 ENCOUNTER — Ambulatory Visit: Admit: 2017-08-15 | Payer: MEDICARE | Primary: Family Medicine

## 2017-08-15 DIAGNOSIS — I482 Chronic atrial fibrillation: Secondary | ICD-10-CM

## 2017-08-15 LAB — AMB POC PT/INR: INR POC: 1.9

## 2017-08-15 NOTE — Progress Notes (Signed)
INR SUPRATHERAPEUTIC AT 1.9 TOTAL WEEKLY DOSE IS 25 MGS. DENIES S/S OF BLEEDING OR THROMBOEMBOLISM. MEDICATION REVIEWED AND CURRENT. PATIENT RAN OUT OF COQ10 3 DAYS AGO SO SHE MISSED 3 DOSES, MISSED ENSURE 2 DAYS, NO CINNAMON FOR A COUPLE OF DAYS, AND MISSED 2 DOSES OF METAMUCIL.   TEACHING PERFORMED THAT ALL OF THESE MEDICATIONS EFFECT THE INR AND IT WOULD BENEFIT HER TO BE MORE CONSISTENT WITH HER MEDICATIONS. PATIENT AGREES.  THIS WOULD ACCOUNT FOR THE DIP IN THE INR. PATIENT WILL RESUME ALL OF THEM.  DENIES DIETARY CHANGES. NO PLANS TO TRAVEL AND NO INVASIVE PROCEDURES SCHEDULED.  WILL DOSE WARFARIN 5 MG TONIGHT AS A ONE TIME ONLY AND THEN RESUME PREVIOUS DOSING OF 2.5 MG DAILY EX 5 MG ON M/W/F WITH F/U IN 2.5 WEEKS.

## 2017-08-23 DIAGNOSIS — M9903 Segmental and somatic dysfunction of lumbar region: Secondary | ICD-10-CM | POA: Diagnosis not present

## 2017-08-23 DIAGNOSIS — M545 Low back pain: Secondary | ICD-10-CM | POA: Diagnosis not present

## 2017-08-23 DIAGNOSIS — M9901 Segmental and somatic dysfunction of cervical region: Secondary | ICD-10-CM | POA: Diagnosis not present

## 2017-08-23 DIAGNOSIS — M9905 Segmental and somatic dysfunction of pelvic region: Secondary | ICD-10-CM | POA: Diagnosis not present

## 2017-08-27 ENCOUNTER — Encounter: Primary: Family Medicine

## 2017-09-02 ENCOUNTER — Ambulatory Visit: Admit: 2017-09-02 | Payer: MEDICARE | Primary: Family Medicine

## 2017-09-02 DIAGNOSIS — I482 Chronic atrial fibrillation: Secondary | ICD-10-CM

## 2017-09-02 LAB — AMB POC PT/INR: INR POC: 2.7

## 2017-09-02 NOTE — Progress Notes (Signed)
INR THERAPEUTIC AT 2.7 TOTAL WEEKLY DOSE IS 25 MGS. DENIES S/S OF BLEEDING OR THROMBOEMBOLISM. MEDICATION REVIEWED AND CURRENT. PATIENT WILL STOP PROBIOTIC IN ABOUT A WEEK AND REPLACE WITH KOMBUCHA, AND THIS MAY BE AN EVEN SWITCH. PATIENT RESUME COQ10 AND CINNAMON. DENIES DIETARY CHANGES. NO PLANS TO TRAVEL AND NO INVASIVE PROCEDURES SCHEDULED.  WILL CONT CURRENT WARFARIN DOSING OF 2.5 MG DAILY EX 5 MG ON MON, WED, FRI WITH F/U IN 2 WEEKS.

## 2017-09-17 ENCOUNTER — Ambulatory Visit: Admit: 2017-09-17 | Discharge: 2017-09-17 | Payer: MEDICARE | Primary: Family Medicine

## 2017-09-17 DIAGNOSIS — I482 Chronic atrial fibrillation, unspecified: Secondary | ICD-10-CM

## 2017-09-17 DIAGNOSIS — Z7901 Long term (current) use of anticoagulants: Secondary | ICD-10-CM | POA: Diagnosis not present

## 2017-09-17 DIAGNOSIS — Z7982 Long term (current) use of aspirin: Secondary | ICD-10-CM | POA: Diagnosis not present

## 2017-09-17 DIAGNOSIS — I2510 Atherosclerotic heart disease of native coronary artery without angina pectoris: Secondary | ICD-10-CM | POA: Diagnosis not present

## 2017-09-17 DIAGNOSIS — Z951 Presence of aortocoronary bypass graft: Secondary | ICD-10-CM | POA: Diagnosis not present

## 2017-09-17 DIAGNOSIS — I10 Essential (primary) hypertension: Secondary | ICD-10-CM | POA: Diagnosis not present

## 2017-09-17 DIAGNOSIS — I4891 Unspecified atrial fibrillation: Secondary | ICD-10-CM | POA: Diagnosis not present

## 2017-09-17 LAB — AMB POC PT/INR: INR POC: 2.4

## 2017-09-17 NOTE — Progress Notes (Signed)
INR LOW RANGE THERAPEUTIC  AT 2.4.  RECEIVED WEEKLY DOSE OF 25MG . DENIES ANY S&S OF BLEEDING OR THROMBOEMBOLISM.  APPETITE IS GOOD AND DENIES ANY CHANGES TO HER DIET, ALTHOUGH FEELS THAT SHE HAS HAD LESS SALAD. WAS EATING VEGETABLE SOUP SINCE Sunday.  MEDICATIONS REVIEWED AND UPDATED FOR CHANGING FROM CINNAMON DAILY TO A SUPPLEMENT WITH CINNAMON, GYMNEA AND MULBERRY LEAF DAILY. PT ALSO HAS NOT HAD HER USUAL 2OX OF KAMBUCHA FOR ABOUT A WEEK NOW.   DENIES ANY MEDICAL PROCEDURES OR TRAVEL PLANS. LOOKING BACK AT HISTORY, PT WAS ON 27.5MG  PRIOR TO STARTING A PROBIOTIC DAILY AND SHE HAS NOW STOPPED. WILL INCREASE WEEKLY DOSE TO 27.5MG . WILL HAVE PT TAKE 5MG  DAILY EX 2.5MG SUN/TUE/SATURDAY.  F/U IN 2 WEEKS.  TIME SPENT 20 MINUTES.

## 2017-10-04 ENCOUNTER — Ambulatory Visit: Admit: 2017-10-04 | Discharge: 2017-10-04 | Payer: MEDICARE | Primary: Family Medicine

## 2017-10-04 DIAGNOSIS — I482 Chronic atrial fibrillation, unspecified: Secondary | ICD-10-CM

## 2017-10-04 LAB — AMB POC PT/INR: INR POC: 1.5

## 2017-10-04 NOTE — Progress Notes (Signed)
INR SUBTHERAPEUTICAT 1.5.   QUESTION WEEKLY DOSE OF 25MG  ALTHOUGH INTENDED DOSE WAS 27.5MG . PT FEELS THAT SHE MAY HAVE ONLY TAKEN A 2.5MG   WHEN SHE WAS SUPPOSED TO TAKE 5MG . PT HAD INFORMATION ON WARFARIN AND THE FILLERS THAT ARE USED AND SHE NOTICED ON THE 5MG  TABS THEY USE ALUMINUM WHICH PT STATES CAUSES PROBLEMS WITH THE BRAIN. PT ALSO STOPPED HER GARCINIA CAMBOGIA AND NOW TAKING HERBAL LIFE BRAND METABOLISM BOOST 1 CAPSULE DAILY. PT ALSO STOPPED HER CLARITIN FOR A COUPLE OF DAYS AND TOOK CORICIDIN BECAUSE SHE WAS NOT FEELING WELL. PT ALSO USED ELDERBERRY FOR UPSET STOMACH. PT HAS ALSO NOT RESUMED BOTH HER DAILY PROBIOTIC AND KOMBUCHA.   DENIES ANY S&S OF BLEEDING OR THROMBOEMBOLISM.  APPETITE IS GOOD AND DENIES ANY CHANGES TO HER DIET.  MEDICATIONS REVIEWED AND UPDATED FOR CHANGES. PT WILL BRING THE HERBAL LIFE BOTTLE NEXT WEEK SO I CAN SEE INGREDIENTS. PT PLANS ON RESUMING HER PROBIOTIC TODAY.    DENIES ANY MEDICAL PROCEDURES OR TRAVEL PLANS. WILL HAVE PT TAKE 7.5MG  TONIGHT THEN  5MG  M/T/W AND 2.5MG  SAT/SUN/THURSDAY. F/U IN 1 WEEK. TOTAL WEEKLY DOSE WILL BE 30MG .  TIME SPENT 20 MINUTES.

## 2017-10-11 ENCOUNTER — Ambulatory Visit: Admit: 2017-10-11 | Discharge: 2017-10-11 | Payer: MEDICARE | Primary: Family Medicine

## 2017-10-11 DIAGNOSIS — I482 Chronic atrial fibrillation, unspecified: Secondary | ICD-10-CM

## 2017-10-11 LAB — AMB POC PT/INR: INR POC: 2.6

## 2017-10-11 NOTE — Progress Notes (Signed)
INR THERAPEUTIC AT 2.6 AND UP FROM 1.5.   RECEIVED WEEKLY DOSE OF 30MG . PT HAS RESUMED BOTH HER DAILY PROBIOTIC AND THE KOMBUCHA DAILY.  DENIES ANY S&S OF BLEEDING OR THROMBOEMBOLISM. APPETITE IS GOOD AND DENIES ANY CHANGES TO HER DIET. DOES HAVE A CUP OF TEA JUST ABOUT DAILY.   MEDICATIONS REMAIN THE SAME AS VISIT 10/04/2017. PT WAS GOING TO BRING IN THE BOTTLE OF THE HERBAL LIFE METABOLISM BOOST BUT FORGOT. PT DID HAVE A CAP PLACED THIS PAST WEEK TO HER TOOTH.   DENIES ANY MEDICAL PROCEDURES OR TRAVEL PLANS. WILL HAVE PT RESUME HER USUAL WEEKLY DOSING OF 27.5MG . WILL HAVE PT TAKE 5MG  DAILY EX 2.5MG  SUN/THUR/SATURDAY. F/U IN 2 WEEKS.  TIME SPENT 15 MINUTES. PT WILL BRING IN HER BOTTLE OF HERBAL LIFE METABOLISM BOOST AT NEXT VISIT.

## 2017-10-25 ENCOUNTER — Ambulatory Visit: Admit: 2017-10-25 | Discharge: 2017-11-21 | Payer: MEDICARE | Primary: Family Medicine

## 2017-10-25 DIAGNOSIS — I482 Chronic atrial fibrillation, unspecified: Secondary | ICD-10-CM

## 2017-10-25 DIAGNOSIS — I10 Essential (primary) hypertension: Secondary | ICD-10-CM | POA: Diagnosis not present

## 2017-10-25 DIAGNOSIS — E119 Type 2 diabetes mellitus without complications: Secondary | ICD-10-CM | POA: Diagnosis not present

## 2017-10-25 LAB — AMB POC PT/INR: INR POC: 1.7

## 2017-10-25 NOTE — Progress Notes (Signed)
INR LOW AT 1.7 VIA [facility] MACHINE.   RECEIVED WEEKLY DOSE OF 27.5MG . PT STARTED DRINKING A CLEANSING TEA DAILY AND IS GOING TO CONTINUE. THIS WOULD BE REASON FOR INR BEING LOW.  DENIES ANY S&S OF BLEEDING OR THROMBOEMBOLISM. APPETITE IS GOOD AND DENIES ANY CHANGES TO HER DIET. MEDICATIONS REMAIN THE SAME AS VISIT 10/11/2017. PT DID BRING HER BOTTLE OF HERBAL LIFE METABOLISM BOOST BUT LEFT IT IN THE CAR.   DENIES ANY MEDICAL PROCEDURES OR TRAVEL PLANS. WILL INCREASE WEEKLY DOSE TO 30MG  DUE TO PT CONTINUING WITH TEA. EDUCATED PT THAT I AM INCREASING HER WARFARIN DUE TO TEA IMPACTING INR BY GOING DOWN SO SHE NEEDS TO CALL THE CLINIC IF SHE STOPS. WILLHAVE PT TAKE 7.5MG  TONIGHT ONLY THEN 5MG DAILY EX 2.5MG  SUN/THURSDAY. F/U IN 2 WEEKS.  TIME SPENT 15 MINUTES.

## 2017-10-29 DIAGNOSIS — I482 Chronic atrial fibrillation: Secondary | ICD-10-CM | POA: Diagnosis not present

## 2017-10-29 DIAGNOSIS — Z951 Presence of aortocoronary bypass graft: Secondary | ICD-10-CM | POA: Diagnosis not present

## 2017-10-29 DIAGNOSIS — I110 Hypertensive heart disease with heart failure: Secondary | ICD-10-CM | POA: Diagnosis not present

## 2017-10-29 DIAGNOSIS — R079 Chest pain, unspecified: Secondary | ICD-10-CM | POA: Diagnosis not present

## 2017-10-29 DIAGNOSIS — Z79899 Other long term (current) drug therapy: Secondary | ICD-10-CM | POA: Diagnosis not present

## 2017-10-29 DIAGNOSIS — R05 Cough: Secondary | ICD-10-CM | POA: Diagnosis not present

## 2017-10-29 DIAGNOSIS — J069 Acute upper respiratory infection, unspecified: Secondary | ICD-10-CM | POA: Diagnosis not present

## 2017-10-29 DIAGNOSIS — Z7982 Long term (current) use of aspirin: Secondary | ICD-10-CM | POA: Diagnosis not present

## 2017-10-29 DIAGNOSIS — I2510 Atherosclerotic heart disease of native coronary artery without angina pectoris: Secondary | ICD-10-CM | POA: Diagnosis not present

## 2017-10-29 DIAGNOSIS — I959 Hypotension, unspecified: Secondary | ICD-10-CM | POA: Diagnosis not present

## 2017-10-29 DIAGNOSIS — R42 Dizziness and giddiness: Secondary | ICD-10-CM | POA: Diagnosis not present

## 2017-10-29 DIAGNOSIS — Z7901 Long term (current) use of anticoagulants: Secondary | ICD-10-CM | POA: Diagnosis not present

## 2017-10-29 DIAGNOSIS — R031 Nonspecific low blood-pressure reading: Secondary | ICD-10-CM | POA: Diagnosis not present

## 2017-10-29 DIAGNOSIS — E860 Dehydration: Secondary | ICD-10-CM | POA: Diagnosis not present

## 2017-10-29 DIAGNOSIS — I5032 Chronic diastolic (congestive) heart failure: Secondary | ICD-10-CM | POA: Diagnosis not present

## 2017-10-30 DIAGNOSIS — I482 Chronic atrial fibrillation: Secondary | ICD-10-CM | POA: Diagnosis not present

## 2017-10-30 DIAGNOSIS — R42 Dizziness and giddiness: Secondary | ICD-10-CM | POA: Diagnosis not present

## 2017-10-30 DIAGNOSIS — I959 Hypotension, unspecified: Secondary | ICD-10-CM | POA: Diagnosis not present

## 2017-10-30 DIAGNOSIS — I2510 Atherosclerotic heart disease of native coronary artery without angina pectoris: Secondary | ICD-10-CM | POA: Diagnosis not present

## 2017-10-30 DIAGNOSIS — J069 Acute upper respiratory infection, unspecified: Secondary | ICD-10-CM | POA: Diagnosis not present

## 2017-11-08 ENCOUNTER — Encounter: Primary: Family Medicine

## 2017-11-12 ENCOUNTER — Ambulatory Visit: Admit: 2017-11-12 | Payer: MEDICARE | Primary: Family Medicine

## 2017-11-12 DIAGNOSIS — I482 Chronic atrial fibrillation, unspecified: Secondary | ICD-10-CM

## 2017-11-12 LAB — AMB POC PT/INR: INR POC: 2.2

## 2017-11-12 NOTE — Progress Notes (Signed)
INR LOW ENDTherapeutic AT 2.2 ON 30 MG FOR THE WEEK.PT TOOK MEDICATION AS INSTRUCTED.DENIES S/S OF BLEEDING AND THROMBOEMBOLISM.DENIES DIETARY CHANGES.DENIES TRAVEL PLANS.DENIES UPCOMING PROCEDURES. MEDS REVIEWED AND UPDATED.MULTIPLE CHANGES,HAS BEEN TAKING JUST HALF HER LASIX SINCE SHE WAS IN THE HOSPITAL WHICH MAY EXPLAIN WHY SHE IS LOW. PLANS TO CALL CARDIOLOGIST TODAY TO LET THEM KNOW WHAT SHE IS DOING BUT HAS NOT YET DONE SO.OFFERED TO CALL FOR HER BUT SHE SAYS SHE WILL DO IT.HAS USED [facility] , WENT TO ED WITH LOW BP AND WAS GIVEN FLUIDS.HAD ONE DOSE ANTIBIOTIC AND A TYLENOL AND HOME THE NEXT DAY.FEELS SHE WAS DEHYDRATED.MISSED A FEW DAYS OF KOMBUCHA AS SHE RAN OUT BUT HAS RESTARTED.WILL HAVE HER TAKE 7.5MG  TODAY ONLY AND THEN INCREASE TO DOSING OF 5 MG DAILY EXCEPT 2.5 ON Thursday. F/U IN 10 DAYS DUE TO MULTIPLE CHANGES.PT INSTRUCTED THAT SHE IS LOW AND AT RISK FOR CLOTTING. EMPHASIZED NEED TO KEEP APPTS AS SHE NEEDS TO BE MONITORED MORE CLOSELY UNTIL SHE IS MORE MID RANGE.EXPLAINED A LAB DRAW WOULD LIKELY SHOW SHE IS EVEN A LITTLE LOWER. PT TOLD SHE NEEDS TO INFORM US WHEN MEDS ARE CHANGED.PT UNDERSTANDS INSTRUCTION. STATES IT IS HARD TO KEEP APPTS BUT SHE WILL DO HER BEST TO BE HERE.

## 2017-11-22 ENCOUNTER — Ambulatory Visit: Admit: 2017-11-22 | Payer: MEDICARE | Primary: Family Medicine

## 2017-11-22 DIAGNOSIS — I482 Chronic atrial fibrillation, unspecified: Secondary | ICD-10-CM

## 2017-11-22 LAB — AMB POC PT/INR: INR POC: 3.4

## 2017-11-22 NOTE — Progress Notes (Signed)
INR OK AT 3.4 VIA [facility] MACHINE.   RECEIVED WEEKLY DOSE OF 32.5MG . DENIES ANY S&S OF BLEEDING OR THROMBOEMBOLISM.  APPETITE IS GOOD AND DENIES ANY CHANGES TO HER DIET, ALTHOUGH IS TRYING TO CUT SUGAR OUT OF HER DIET AND MAY BE HAVING SLIGHTLY MORE IN THE WAY OF VEGETABLES AND SALADS. PT CONTINUES TO HAVE TEA DAILY BUT IS NO LONGER DRINKING THE CLEANSING TEA. PT IS DRINKING WATER WITH LEMON,CUCUMBER AND MINT. MEDICATIONS REMAIN THE SAME AS VISIT 11/12/2017. PT DID RESUME HER KOMBUCHA.  PT DID LET ME KNOW THAT SHE DID CHECK WITH HER DOCTOR AND SHE CAN TAKE THE LASIX 20MG  DAILY RATHER THAN 40MG  DAILY. PT ALSO CONTINUES TO TAKE THE SUPPLEMENT HERBAL LIFE METABOLISM. EDUCATED PT TO CALL [facility] IF SHE ENDS UP STOPPING.  DENIES ANY MEDICAL PROCEDURES OR TRAVEL PLANS. INR HAS BEEN RUNNING AT LOW END OF RANGE FOR A COUPLE OF VISITS NOW, ALTHOUGH PT HAS HAD MANY MEDICATION CHANGES AS WELL.  WILL HAVE PT CONTINUE 5MG  DAILY EX 2.5MG  Thursday.  F/U IN 2 WEEKS.  TIME SPENT 15 MINUTES. PT WILL GO TO LAB PRIOR TO NEXT VISIT.

## 2017-11-28 ENCOUNTER — Telehealth

## 2017-11-28 NOTE — Progress Notes (Signed)
RECEIVED  A CALL FROM PATIENT LETTING ME KNOW THAT SHE IS GOING TO START FISHOIL DAILY AS OF YESTERDAY. WOULD EXPECT INR TO GO UP AS A RESULT. DOSING ON BOTTLE CALLS FOR 4 TABS DAILY BUT PT IS ONLY GOING TO TAKE 1 DAILY. PT WILL BE STOPPING HER COD LIVER OIL. INSTRUCTED PT TO DECREASE Friday'S DOSE TO 2.5MG  AND THEN FOLLOW HER DOSING AS PER CARD. PT ALSO MENTIONED THAT SHE HAS TINY RED SPOTS TO BOTH HER HANDS WHICH SHE FEELS SHE GETS WHEN HER BLOOD IS TOO THIN. PT WILL TAKE A PICTURE OF WHAT IT LOOKS LIKE ON HER PHONE. WILL FOLLOW UP AT SCHEDULED APPOINTMENT 12/06/2017. PT WILL BE GOING TO LAB PRIOR TO NEXT APPOINTMENT.

## 2017-12-06 ENCOUNTER — Encounter: Primary: Family Medicine

## 2017-12-09 ENCOUNTER — Encounter: Primary: Family Medicine

## 2017-12-11 ENCOUNTER — Inpatient Hospital Stay: Admit: 2017-12-11 | Payer: MEDICARE | Primary: Family Medicine

## 2017-12-11 ENCOUNTER — Ambulatory Visit: Admit: 2017-12-11 | Payer: MEDICARE | Primary: Family Medicine

## 2017-12-11 DIAGNOSIS — I482 Chronic atrial fibrillation, unspecified: Secondary | ICD-10-CM

## 2017-12-11 DIAGNOSIS — Z5181 Encounter for therapeutic drug level monitoring: Secondary | ICD-10-CM

## 2017-12-11 DIAGNOSIS — Z7901 Long term (current) use of anticoagulants: Secondary | ICD-10-CM | POA: Diagnosis not present

## 2017-12-11 LAB — AMB POC PT/INR: INR POC: 3.2

## 2017-12-11 LAB — PROTHROMBIN TIME + INR
INR: 3.5 — ABNORMAL HIGH (ref 0.7–1.0)
Prothrombin time: 36.8 s — ABNORMAL HIGH (ref 9.7–11.4)

## 2017-12-11 NOTE — Progress Notes (Signed)
INR JUST SLIGHTLY ELEVATED  AT 3.2 VIA [facility] MACHINE AND 3.5 AT THE LAB. RECEIVED WEEKLY DOSE OF 30MG  or 32.5mg . DOSE ADJUSTED FOR PT STOPPING COD LIVER OIL AND NOW TAKING FISH OIL 1 CAP DAILY AS OF 11/29/2017, BUT THEN PT DID NOT COME IN AS SCHEDULED 3/1 SO QUESTION IF SHE TOOK 2.5MG  OR 5MG  ON 3/1. DENIES ANY S&S OF BLEEDING OR THROMBOEMBOLISM. DOES HAVE 2 AREAS ON HER LEFT ARM THAT SHE REFERS TO AS RED DOTS WHICH SHE FEELS IS A RESULT OF HER BLOOD BEING TOO THIN. AREAS APPEAR TO BE SCRATCH LIKE IN NATURE WITH TINY SCABS NOTED.  APPETITE IS GOOD AND DENIES ANY CHANGES TO HER DIET.  MEDICATIONS REVIEWED AND CURRENT. PT NO LONGER HAVING ALOE VERA JUICE DAILY. PT DOES GO BETWEEN 20-40MG  OF HER LASIX DAILY DEPENDING ON HER WEIGHT.  DENIES ANY MEDICAL PROCEDURES. TRAVELING TO [place] 4/23-4/27 TO VISIT WITH HER SISTERS.  WILL HAVE PT CONTINUE 5MG  DAILY EX 2.5MG  WED/SATURDAY. PT WILL HAVE A SALAD OR A GREEN VEGETABLE TONIGHT.  F/U IN 3 WEEKS.  TIME SPENT 15 MINUTES.

## 2018-01-01 ENCOUNTER — Ambulatory Visit: Admit: 2018-01-01 | Payer: MEDICARE | Primary: Family Medicine

## 2018-01-01 DIAGNOSIS — I482 Chronic atrial fibrillation, unspecified: Secondary | ICD-10-CM

## 2018-01-01 LAB — AMB POC PT/INR: INR POC: 1.9

## 2018-01-01 NOTE — Progress Notes (Signed)
INR LOW AT 1.9 VIA COAG MACHINE.   RECEIVED WEEKLY DOSE OF 30MG  WHICH IS A SLIGHTLY LOWER DOSE DUE TO PT CHANGING COD LIVER OIL TO FISH OIL. PT HAS ALSO BEEN OUT OF HER COQ10 50MG  DAILY FOR A COUPLE OF WEEKS NOW. QUESTION IF THIS IS IMPACTING INR AS WELL. PT ALSO RAN OUT OF HER [place] SINCE Monday. THIS TOO COULD BE IMPACTING HER INR. PT IS GOING TO RESUME BOTH TODAY. DENIES ANY S&S OF BLEEDING OR THROMBOEMBOLISM. APPETITE IS GOOD AND DENIES ANY CHANGES TO HER DIET, ALTHOUGH DID SAY SHE MAY HAVE HAD LESS IN THE WAY OF GREENS. MEDICATIONS REVIEWED AND CURRENT. PT STATES THAT SHE HAS NOT HAD HER CLARITIN 10MG  DAILY FOR A COUPLE OF WEEKS NOW. ALLERGIES HAVE BEEN OK THUS FAR.  DENIES ANY MEDICAL PROCEDURES. TRAVELING TO [place] 4/23-4/27 TO VISIT WITH HER SISTERS. WILL INCREASE WEEKLY DOSE BACK TO 32.5MG .  WILL HAVE PT TAKE5MG  DAILY EX 2.5MG  Saturday. F/U IN 2 WEEKS.  TIME SPENT 15 MINUTES.

## 2018-01-08 DIAGNOSIS — E119 Type 2 diabetes mellitus without complications: Secondary | ICD-10-CM | POA: Diagnosis not present

## 2018-01-15 ENCOUNTER — Encounter: Primary: Family Medicine

## 2018-01-15 ENCOUNTER — Inpatient Hospital Stay
Admit: 2018-01-15 | Discharge: 2018-01-15 | Disposition: A | Discharge status: Home / Self Care | Payer: MEDICARE | Attending: Emergency Medicine

## 2018-01-15 ENCOUNTER — Emergency Department: Admit: 2018-01-15 | Payer: MEDICARE | Primary: Family Medicine

## 2018-01-15 DIAGNOSIS — S60221A Contusion of right hand, initial encounter: Secondary | ICD-10-CM

## 2018-01-15 DIAGNOSIS — S6991XA Unspecified injury of right wrist, hand and finger(s), initial encounter: Secondary | ICD-10-CM | POA: Diagnosis not present

## 2018-01-15 DIAGNOSIS — M9905 Segmental and somatic dysfunction of pelvic region: Secondary | ICD-10-CM | POA: Diagnosis not present

## 2018-01-15 DIAGNOSIS — M9903 Segmental and somatic dysfunction of lumbar region: Secondary | ICD-10-CM | POA: Diagnosis not present

## 2018-01-15 DIAGNOSIS — M545 Low back pain: Secondary | ICD-10-CM | POA: Diagnosis not present

## 2018-01-15 DIAGNOSIS — I2510 Atherosclerotic heart disease of native coronary artery without angina pectoris: Secondary | ICD-10-CM | POA: Diagnosis not present

## 2018-01-15 DIAGNOSIS — M9901 Segmental and somatic dysfunction of cervical region: Secondary | ICD-10-CM | POA: Diagnosis not present

## 2018-01-15 DIAGNOSIS — I10 Essential (primary) hypertension: Secondary | ICD-10-CM | POA: Diagnosis not present

## 2018-01-15 DIAGNOSIS — X500XXA Overexertion from strenuous movement or load, initial encounter: Secondary | ICD-10-CM | POA: Diagnosis not present

## 2018-01-15 DIAGNOSIS — Y929 Unspecified place or not applicable: Secondary | ICD-10-CM | POA: Diagnosis not present

## 2018-01-15 MED ORDER — HYDROCODONE-ACETAMINOPHEN 5 MG-325 MG TAB
5-325 mg | ORAL | Status: AC
Start: 2018-01-15 — End: 2018-01-15
  Administered 2018-01-15: 12:00:00 via ORAL

## 2018-01-15 MED FILL — HYDROCODONE-ACETAMINOPHEN 5 MG-325 MG TAB: 5-325 mg | ORAL | Qty: 1

## 2018-01-15 NOTE — Telephone Encounter (Signed)
Jaime Maxwell needs to reschedule her appt

## 2018-01-15 NOTE — ED Provider Notes (Signed)
Presents to the ED with a chief complaint arm pain.  Patient states that she developed right hand and arm pain ongoing since earlier this week when she was caring a bag that was wrapped around her hands she said a put a lot of pressure in her hand and twisted and now she has swelling and pain to her hand and wrist on the right no numbness no weakness no tingling no paresthesias but she has significant pain with any touching of the entire arm.           Past Medical History:   Diagnosis Date   ??? CAD (coronary artery disease)    ??? Hypertension        Past Surgical History:   Procedure Laterality Date   ??? CARDIAC SURG PROCEDURE UNLIST  09/25/2016    CABG, stent 2010   ??? HX GYN      partial hyst         History reviewed. No pertinent family history.    Social History     Socioeconomic History   ??? Marital status: MARRIED     Spouse name: Not on file   ??? Number of children: Not on file   ??? Years of education: Not on file   ??? Highest education level: Not on file   Occupational History   ??? Not on file   Social Needs   ??? Financial resource strain: Not on file   ??? Food insecurity:     Worry: Not on file     Inability: Not on file   ??? Transportation needs:     Medical: Not on file     Non-medical: Not on file   Tobacco Use   ??? Smoking status: Never Smoker   ??? Smokeless tobacco: Never Used   Substance and Sexual Activity   ??? Alcohol use: No   ??? Drug use: No   ??? Sexual activity: Not on file   Lifestyle   ??? Physical activity:     Days per week: Not on file     Minutes per session: Not on file   ??? Stress: Not on file   Relationships   ??? Social connections:     Talks on phone: Not on file     Gets together: Not on file     Attends religious service: Not on file     Active member of club or organization: Not on file     Attends meetings of clubs or organizations: Not on file     Relationship status: Not on file   ??? Intimate partner violence:     Fear of current or ex partner: Not on file     Emotionally abused: Not on file      Physically abused: Not on file     Forced sexual activity: Not on file   Other Topics Concern   ??? Not on file   Social History Narrative   ??? Not on file         ALLERGIES: Patient has no known allergies.    Review of Systems   Constitutional: Negative for activity change and fever.   HENT: Negative for congestion.    Respiratory: Negative for cough and chest tightness.    Cardiovascular: Negative for chest pain.       Vitals:    01/15/18 0739   BP: (!) 149/106   Pulse: 73   Resp: 18   Temp: 97.9 ??F (36.6 ??C)   SpO2: 96%  Weight: 83.9 kg (185 lb)   Height: 5\' 3"  (1.6 m)            Physical Exam   Constitutional: She is oriented to person, place, and time. She appears well-developed and well-nourished.   HENT:   Head: Normocephalic and atraumatic.   Eyes: Pupils are equal, round, and reactive to light. EOM are normal.   Neck: Normal range of motion. Neck supple.   Cardiovascular: Normal rate and regular rhythm.   Pulmonary/Chest: Effort normal and breath sounds normal.   Abdominal: Soft. Bowel sounds are normal.   Musculoskeletal: Normal range of motion.   There is slight swelling to the dorsum of the hand there is no warmth there is no erythema there is no deformity noted there is diffuse tenderness palpation to the wrist and hand with tenderness to palpation to light touch of the entire arm pulses intact cap refill is less than 2 sec.   Neurological: She is alert and oriented to person, place, and time.   Skin: Skin is warm and dry. Capillary refill takes less than 2 seconds.   Psychiatric: She has a normal mood and affect.   Nursing note and vitals reviewed.       MDM  Number of Diagnoses or Management Options  Diagnosis management comments: X-rays negative for fracture patient appears have a hand contusion should be discharged home in good condition.  Follow up with PCP use over-the-counter anti-inflammatories.         Procedures      Diagnosis:    ICD-10-CM ICD-9-CM    1. Contusion of right hand, initial encounter S60.221A 923.20

## 2018-01-15 NOTE — Telephone Encounter (Signed)
RETURNED CALL TO PATIENT REGARDING MESSAGE THAT SHE NEEDS TO RESCHEDULE. LEFT MESSAGE ON HER VOICEMAIL LETTING HER KNOW THAT I RETURNED HER CALL.

## 2018-01-21 NOTE — Telephone Encounter (Signed)
CALLED PATIENT IN FOLLOW UP TO MISSED APPOINTMENT. LEFT MESSAGE TO HAVE PATIENT CALL BACK.

## 2018-01-21 NOTE — Telephone Encounter (Signed)
Jaime Maxwell needs to reschedule her appt

## 2018-01-21 NOTE — Telephone Encounter (Signed)
SEE PHONE NOTE

## 2018-01-22 ENCOUNTER — Ambulatory Visit: Admit: 2018-01-22 | Payer: MEDICARE | Primary: Family Medicine

## 2018-01-22 DIAGNOSIS — I482 Chronic atrial fibrillation, unspecified: Secondary | ICD-10-CM

## 2018-01-22 LAB — AMB POC PT/INR: INR POC: 2.4

## 2018-01-22 NOTE — Progress Notes (Signed)
INR LOW RANGE THERAPEUTIC AT 2.4 VIA COAG MACHINE.  RECEIVED WEEKLY DOSE OF 30MG  ALTHOUGH INTENDED DOSE AT LAST VISIT WAS 32.5MG . PT WENT TO THE ED ON 4/10 DUE TO SWELLING AND DISCOMFORT TO HER RIGHT WRIST. PT HAD PEELED 8LBS OF POTATOES AND THEN LIFTED A HEAVY BACK CAUSING INJURY TO HER WRIST. WORK UP WAS NEGATIVE AND WRIST IS BETTER BUT STILL SOME DISCOMFORT. DENIES ANY S&S OF BLEEDING OR THROMBOEMBOLISM. APPETITE IS GOOD AND DENIES ANY CHANGES TO HER DIET.  MEDICATIONS REVIEWED AND CURRENT. PT HAS BEEN OUT OF HER VITAMIN E NOW FOR ABOUT A WEEK WHICH WOULD BE IMPACTING INR HOWEVER SHE IS GOING TO REPLACE. PT ALSO BOUGHT [facility] BRAND OF METAMUCIL INSTEAD OF BRAND NAME AND DID NOT TAKE LAST NIGHT BECAUSE SHE DIDN'T LIKE THE TASTE. PT IS GOING TO GO BACK TO BRAND NAME METAMUCIL.   DENIES ANY MEDICAL PROCEDURES. PT LEAVING FOR [place] 4/23-4/27 TO VISIT WITH HER SISTERS. EDUCATED PT TO KEEP DIET CONSISTENT WHILE SHE IS AWAY. WILL HAVE PT TAKE 5MG  DAILY EX 2.5MG  SATURDAY. F/U IN 2 WEEKS.  TIME SPENT 15 MINUTES.

## 2018-01-23 NOTE — Telephone Encounter (Signed)
RETURNED CALL TIMES 2 TO PATIENT REGARDING MESSAGE THAT SHE IS HAVING A TOOTH PULLED TOMORROW AND NEEDS TO KNOW WHAT TO DO WITH HER WARFARIN. LEFT MESSAGE THAT TYPICALLY WARFARIN IS NOT HELD AND WOULD NEED TO KNOW THE DENTIST SO THAT I COULD CALL AND DISCUSS WITH HIM IF AND DETERMINE WHERE HE PREFERRED INR TO BE. INR YESTERDAY VIA COAG MACHINE 2.4. I DID ASK PATIENT TO CALL ME FIRST THING IN THE MORNING TO DISCUSS.

## 2018-01-23 NOTE — Telephone Encounter (Signed)
Jaime CrapeClaudia is having a tooth extracted tomorrow, 01-24-18.  Please call ASAP to give her coumadin instructions

## 2018-01-24 NOTE — Telephone Encounter (Signed)
CALLED PATIENT TO FOLLOW UP ON MESSAGE FROM YESTERDAY REGARDING TOOTH EXTRACTION. SPOKE TO PATIENT WHO LET ME KNOW THAT SHE IS NOT GOING TO NEED TOOTH EXTRACTED AFTER ALL AND WILL END UP WITH A CROWN. PT WILL NOT NEED ANY ADJUSTMENT TO WARFARIN FOR THIS PROCEDURE AND NEEDS TO WAIT UNTIL SHE IS ABLE TO FINANCIALLY AFFORD.

## 2018-02-05 ENCOUNTER — Ambulatory Visit: Admit: 2018-02-05 | Payer: MEDICARE | Primary: Family Medicine

## 2018-02-05 DIAGNOSIS — I482 Chronic atrial fibrillation, unspecified: Secondary | ICD-10-CM

## 2018-02-05 LAB — AMB POC PT/INR: INR POC: 1.8

## 2018-02-05 NOTE — Progress Notes (Signed)
INR SUBTHERAPEUTIC AT 1.8 VIA COAG. MACHINE. RECEIVED WEEKLY DOSE OF , HOWEVER PT TOOK JUST 2.5MG  ON SUN AND MON INSTEAD OF , STATES THAT IS WHAT WAS AVAILABLE IN PILL BOX, CONFIRMED SHE HASN'T RUN OUT OF HER PRESCRIPTION OF WARFARIN. DENIES ANY S&S OF BLEEDING OR THROMBOEMBOLISM. RETURNED FROM 4 DAY TRIP TO [place] ON 4/27. APPETITE IS GOOD AND DENIES ANY CHANGES TO HER DIET. MEDICATIONS REVIEWED, FORGOT TO TAKE PROBIOTIC FOR THE LAST WK, WILL RESUME TODAY, USING BENFIBER FOR LAST 10DAYS INSTEAD OF METAMUCIL. REQUIRES A TOOTH EXTRACTION HOWEVER NO ARRANGEMENT MADE AT THIS TIME, WILL BE MOVING PERMANENTLY TO [place] AT THE END OF THE MONTH, HASN'T ESTABLISHED WHO WILL BE MANAGING HER WARFARIN IN [place]. WILL MAKEUP MISSED  OF WARFARIN, PT WILL TAKE  TONIGHT 1X ONLY, THEN CONTINUE  DAILY EX 2.5MG  SAT. F/U IN 2 WKS. REMINDED PT SHE IS PUTTING HERSELF AT RISK FOR CLOT FORMATION AND STROKE BY TAKING LESS WARFARIN AS PRESCRIBED RESULTING IN INR BELOW THERAPEUTIC.

## 2018-02-12 ENCOUNTER — Encounter: Primary: Family Medicine

## 2018-02-19 ENCOUNTER — Ambulatory Visit: Admit: 2018-02-19 | Payer: MEDICARE | Primary: Family Medicine

## 2018-02-19 DIAGNOSIS — I482 Chronic atrial fibrillation, unspecified: Secondary | ICD-10-CM

## 2018-02-19 LAB — AMB POC PT/INR: INR POC: 3.5

## 2018-02-19 NOTE — Progress Notes (Signed)
INR SUPRATHERAPEUTIC AT 3.5 VIA COAG. MACHINE. RECEIVED WEEKLY DOSE OF 32.5MG . DEVIATION ON 3/6 +0.3, HIGHER AT LAB. DENIES ANY S&S OF BLEEDING OR THROMBOEMBOLISM. APPETITE IS GOOD AND DENIES ANY CHANGES TO HER DIET, HAVING SALAD, GREEN BEANS, SPINACH, KALE. MEDICATIONS REVIEWED, RESUMED PROBIOTIC 10 DAYS AGO, RAN OUT OF KOMBUCHA 3 DAYS AGO, WILL RESUME TODAY OR TOMORROW. DENIES ANY MEDICAL PROCEDURES, HASN'T SCHEDULED TOOTH EXTRACTION. MOVING TO [place], [place] ON 5/31, DISCUSSED ESTABLISHING PHYSICIAN'S TO FOLLOW HER IN THE AREA, WAS RECOMMENDED A CARDIOLOGY PRACTICE BUT HASN'T CALLED, ADVISED TO CALL AND HAVE RECORDS FORWARDED TO OFFICE AND CONFIRM WHO WILL MANAGE WARFARIN. WILL HAVE PT TAKE 2.5MG  TONIGHT 1X ONLY THEN CONTINUE  DAILY EX 2.5MG  SAT. LAB INR AT F/U IN 2 WEEKS. REITERATED TO BE CONSISTENT WITH MEDICATIONS AND DIET, CALL WITH CHANGES.

## 2018-02-21 DIAGNOSIS — M9903 Segmental and somatic dysfunction of lumbar region: Secondary | ICD-10-CM | POA: Diagnosis not present

## 2018-02-21 DIAGNOSIS — M9901 Segmental and somatic dysfunction of cervical region: Secondary | ICD-10-CM | POA: Diagnosis not present

## 2018-02-21 DIAGNOSIS — M545 Low back pain: Secondary | ICD-10-CM | POA: Diagnosis not present

## 2018-02-21 DIAGNOSIS — M9905 Segmental and somatic dysfunction of pelvic region: Secondary | ICD-10-CM | POA: Diagnosis not present

## 2018-02-25 ENCOUNTER — Inpatient Hospital Stay: Admit: 2018-02-25 | Payer: MEDICARE | Primary: Family Medicine

## 2018-02-25 ENCOUNTER — Encounter

## 2018-02-25 DIAGNOSIS — E139 Other specified diabetes mellitus without complications: Secondary | ICD-10-CM

## 2018-02-25 LAB — HEMOGLOBIN A1C W/EAG
Hemoglobin A1C: 7.1 % — ABNORMAL HIGH (ref 4.2–6.3)
eAG: 157 mg/dL

## 2018-02-25 LAB — MICROALBUMIN, UR
Creatinine, Ur: 35.5 mg/dL
Microalb, Ur: 5.6 mg/L
Microalbumin Creatinine Ratio: 16 mg/g (ref <30)

## 2018-02-25 LAB — COMPREHENSIVE METABOLIC PANEL
ALT: 26 U/L (ref 13–56)
AST: 17 U/L (ref 15–37)
Albumin/Globulin Ratio: 1 (ref 1.0–3.0)
Albumin: 3.9 g/dL (ref 3.4–5.0)
Alkaline Phosphatase: 90 U/L (ref 45–117)
Anion Gap: 9 mmol/L (ref 5–15)
BUN: 16 MG/DL (ref 7–18)
Bun/Cre Ratio: 17 NA (ref 12–20)
CO2: 25 mmol/L (ref 21–32)
Calcium: 9.2 MG/DL (ref 8.5–10.1)
Chloride: 102 mmol/L (ref 98–110)
Creatinine: 0.96 MG/DL (ref 0.55–1.02)
EGFR IF NonAfrican American: >60 mL/min/{1.73_m2} (ref >60)
GFR African American: >60 mL/min/{1.73_m2} (ref >60)
Globulin: 3.8 g/dL — ABNORMAL HIGH (ref 2.5–3.5)
Glucose: 102 mg/dL — ABNORMAL HIGH (ref 70–100)
Potassium: 4.1 mmol/L (ref 3.5–5.1)
Sodium: 136 mmol/L (ref 136–145)
Total Bilirubin: 0.53 mg/dL (ref 0.20–1.20)
Total Protein: 7.7 g/dL (ref 6.4–8.2)

## 2018-02-25 LAB — HEMOGLOBIN A1C WITH EAG
Est. average glucose: 157 mg/dL
Hemoglobin A1c: 7.1 % — ABNORMAL HIGH (ref 4.2–6.3)

## 2018-02-25 LAB — METABOLIC PANEL, COMPREHENSIVE
A-G Ratio: 1 (ref 1.0–3.0)
ALT (SGPT): 26 U/L (ref 13–56)
AST (SGOT): 17 U/L (ref 15–37)
Albumin: 3.9 g/dL (ref 3.4–5.0)
Alk. phosphatase: 90 U/L (ref 45–117)
Anion gap: 9 mmol/L (ref 5–15)
BUN/Creatinine ratio: 17 (ref 12–20)
BUN: 16 MG/DL (ref 7–18)
Bilirubin, total: 0.53 mg/dL (ref 0.20–1.20)
CO2: 25 mmol/L (ref 21–32)
Calcium: 9.2 MG/DL (ref 8.5–10.1)
Chloride: 102 mmol/L (ref 98–110)
Creatinine: 0.96 MG/DL (ref 0.55–1.02)
GFR est AA: >60 mL/min/{1.73_m2} (ref >60)
GFR est non-AA: >60 mL/min/{1.73_m2} (ref >60)
Globulin: 3.8 g/dL — ABNORMAL HIGH (ref 2.5–3.5)
Glucose: 102 mg/dL — ABNORMAL HIGH (ref 70–100)
Potassium: 4.1 mmol/L (ref 3.5–5.1)
Protein, total: 7.7 g/dL (ref 6.4–8.2)
Sodium: 136 mmol/L (ref 136–145)

## 2018-02-25 LAB — MICROALBUMIN, UR, RAND
Creatinine, urine random: 35.5 mg/dL
Microalbumin, urine: 5.6 mg/L
Microalbumin/Creat ratio (mg/g creat): 16 mg/g (ref <30)

## 2018-03-05 ENCOUNTER — Ambulatory Visit: Primary: Family Medicine

## 2018-03-05 ENCOUNTER — Ambulatory Visit: Admit: 2018-03-05 | Payer: MEDICARE | Primary: Family Medicine

## 2018-03-05 DIAGNOSIS — I482 Chronic atrial fibrillation, unspecified: Secondary | ICD-10-CM

## 2018-03-05 LAB — AMB POC PT/INR
INR POC: 2.1
INR, POC: 2.1 NA

## 2018-03-05 NOTE — Progress Notes (Signed)
INR LOW THERAPEUTIC AT 2.1 VIA COAG. MACHINE. RECEIVED WEEKLY DOSE OF 30MG, INTENDED WEEKLY DOSE 32.5MG HOWEVER PT RECALLS TAKING 2.5MG TWICE IN THE LAST WEEK, NOT SURE WHICH DAY, DOESN'T ALWAYS REFER TO DOSING CARD. DIDN'T HAVE LAB INR DRAWN TODAY AS PLANNED D/T TIME CONSTRAINTS. DENIES ANY S&S OF BLEEDING OR THROMBOEMBOLISM. APPETITE IS GOOD AND DENIES ANY CHANGES TO HER DIET, HAD TURNIP GREENS, LIMA BEANS, SALAD 1-2X/WK. MEDICATIONS REVIEWED, STOPPED ASA 81MG 1 WK AGO, INDICATED IT MAY HAVE BOTHERED HER STOMACH AND WANTED TO GIVE HERSELF A REST, HASN'T TAKEN LASIX 20MG DAILY FOR 1 WK D/T NO EVIDENCE OF EDEMA, HASN'T TAKEN CO-Q10 FOR 3 DAYS BECAUSE RAN OUT BUT PLANS TO RESUME, TOOK KOMBUCHA 1X THIS WEEK INSTEAD OF DAILY, RAN OUT BUT PLANS TO RESUME, UPDATED FOR GYMNEA LEAF, CINNAMON AND MULBERRY LEAF TAKING BID INSTEAD OF DAILY. EDUCATED PT STOPPING ASA AND LASIX DECREASES INR, CHANGES IN HOW SHE IS TAKING SUPPLEMENTS IS ALSO IMPACTING INR. DENIES ANY MEDICAL PROCEDURES, MOVING TO [place], [place] AT THE END OF THE WEEK HAD MEDICAL RECORDS FORWARDED TO DR. TIFFANY RANDOLF AT CARDIOLOGY OFFICE, APPOINTMENT NOT SCHEDULED. WILL HAVE PT TAKE 7.5MG TODAY 1X ONLY THEN TAKE 5MG DAILY. SOFIA LAI ZUERCHER, APRN-C PROVIDED PRESCRIPTION FOR LAB PT/INR, INSTRUCTED TO HAVE DRAWN IN [place] ON WED. OR THURS. OF NEXT WEEK, WILL CONTINUE MANAGING WARFARIN UNTIL ESTABLISHED WITH PRACTIONER IN [place]. PT'S CELL PHONE NO. [phone].

## 2018-03-05 NOTE — Progress Notes (Signed)
I WAS CALLED IN TO CONSULT WITH NURSE SHARON RE PT WHO HAS HAD MANY MED CHANGES AND ALSO MOVING TO [place], DRIVING TO [place] AND WILL GET THERE BY Saturday. INR ON THE LOW END OF RANGE AT 2.1 VIA COAG MACHINE.  PT HAS STOPPED HER ASA 81MG AND & LASIX 20MG LAST WK. I SUGGEST THAT SHE F/U WITH HE NEW CARDIOLOGIST IN [place] AND THAT SHE SHOULD NOT INDEPENDENTLY STOP HER MEDS WITHOUT ANYONE'S KNOWLEDGE. BY STOPPING THESE 2 MEDS, IT DIPPED FOR HER INR. WE CAN DOSE ACCORDINGLY AND RECHECK SOONER. PT HISTORICALLY DOES CHANGE HER MEDICATIONS AS SHE SEES FIT. I REITERATED THAT IT'S NOT SAFE TO DO. I THEN PROVIDED HER WITH A SCRIPT FOR PT/INR PRN. WE DID AGREE THAT SHE HAS HER INR CHECK IN ONE WK. PT UNDERSTANDS AND AMENDABLE TO PLAN.

## 2018-03-05 NOTE — Progress Notes (Signed)
I WAS CALLED IN TO CONSULT WITH NURSE SHARON RE PT WHO HAS HAD MANY MED CHANGES AND ALSO MOVING TO [place]IVING TO [place] AND WILL GET THERE BY Saturday. INR ON THE LOW END OF RANGE AT 2.1 VIA COAG MACHINE.  PT HAS STOPPED HER ASA  AND & LASIX  LAST WK. I SUGGEST THAT SHE F/U WITH HE NEW CARDIOLOGIST IN [place] AND THAT SHE SHOULD NOT INDEPENDENTLY STOP HER MEDS WITHOUT ANYONE'S KNOWLEDGE. BY STOPPING THESE 2 MEDS, IT DIPPED FOR HER INR. WE CAN DOSE ACCORDINGLY AND RECHECK SOONER. PT HISTORICALLY DOES CHANGE HER MEDICATIONS AS SHE SEES FIT. I REITERATED THAT IT'S NOT SAFE TO DO. I THEN PROVIDED HER WITH A SCRIPT FOR PT/INR PRN. WE DID AGREE THAT SHE HAS HER INR CHECK IN ONE WK. PT UNDERSTANDS AND AMENDABLE TO PLAN.

## 2018-03-05 NOTE — Progress Notes (Signed)
INR LOW THERAPEUTIC AT 2.1 VIA COAG. MACHINE. RECEIVED WEEKLY DOSE OF , INTENDED WEEKLY DOSE 32.5MG  HOWEVER PT RECALLS TAKING 2.5MG  TWICE IN THE LAST WEEK, NOT SURE WHICH DAY, DOESN'T ALWAYS REFER TO DOSING CARD. DIDN'T HAVE LAB INR DRAWN TODAY AS PLANNED D/T TIME CONSTRAINTS. DENIES ANY S&S OF BLEEDING OR THROMBOEMBOLISM. APPETITE IS GOOD AND DENIES ANY CHANGES TO HER DIET, HAD TURNIP GREENS, LIMA BEANS, SALAD 1-2X/WK. MEDICATIONS REVIEWED, STOPPED ASA  1 WK AGO, INDICATED IT MAY HAVE BOTHERED HER STOMACH AND WANTED TO GIVE HERSELF A REST, HASN'T TAKEN LASIX  DAILY FOR 1 WK D/T NO EVIDENCE OF EDEMA, HASN'T TAKEN CO-Q10 FOR 3 DAYS BECAUSE RAN OUT BUT PLANS TO RESUME, TOOK KOMBUCHA 1X THIS WEEK INSTEAD OF DAILY, RAN OUT BUT PLANS TO RESUME, UPDATED FOR GYMNEA LEAF, CINNAMON AND MULBERRY LEAF TAKING BID INSTEAD OF DAILY. EDUCATED PT STOPPING ASA AND LASIX DECREASES INR, CHANGES IN HOW SHE IS TAKING SUPPLEMENTS IS ALSO IMPACTING INR. DENIES ANY MEDICAL PROCEDURES, MOVING TO [place], [place] AT THE END OF THE WEEK HAD MEDICAL RECORDS FORWARDED TO DR. Elmarie Shiley RANDOLF AT CARDIOLOGY OFFICE, APPOINTMENT NOT SCHEDULED. WILL HAVE PT TAKE 7.5MG  TODAY 1X ONLY THEN TAKE  DAILY. SOFIA LAI ZUERCHER, APRN-C PROVIDED PRESCRIPTION FOR LAB PT/INR, INSTRUCTED TO HAVE DRAWN IN [place] ON WED. OR THURS. OF NEXT WEEK, WILL CONTINUE MANAGING WARFARIN UNTIL ESTABLISHED WITH PRACTIONER IN [place]. PT'S CELL PHONE NO. [phone].

## 2018-03-13 DIAGNOSIS — M9901 Segmental and somatic dysfunction of cervical region: Secondary | ICD-10-CM | POA: Diagnosis not present

## 2018-03-13 DIAGNOSIS — M47812 Spondylosis without myelopathy or radiculopathy, cervical region: Secondary | ICD-10-CM | POA: Diagnosis not present

## 2018-03-13 DIAGNOSIS — M542 Cervicalgia: Secondary | ICD-10-CM | POA: Diagnosis not present

## 2018-03-13 DIAGNOSIS — M9903 Segmental and somatic dysfunction of lumbar region: Secondary | ICD-10-CM | POA: Diagnosis not present

## 2018-03-17 ENCOUNTER — Ambulatory Visit (INDEPENDENT_AMBULATORY_CARE_PROVIDER_SITE_OTHER): Payer: Medicare Other | Admitting: Pharmacist

## 2018-03-17 DIAGNOSIS — I482 Chronic atrial fibrillation, unspecified: Secondary | ICD-10-CM

## 2018-03-17 DIAGNOSIS — Z7901 Long term (current) use of anticoagulants: Secondary | ICD-10-CM

## 2018-03-17 DIAGNOSIS — M9901 Segmental and somatic dysfunction of cervical region: Secondary | ICD-10-CM | POA: Diagnosis not present

## 2018-03-17 DIAGNOSIS — M9903 Segmental and somatic dysfunction of lumbar region: Secondary | ICD-10-CM | POA: Diagnosis not present

## 2018-03-17 DIAGNOSIS — M542 Cervicalgia: Secondary | ICD-10-CM | POA: Diagnosis not present

## 2018-03-17 DIAGNOSIS — M47812 Spondylosis without myelopathy or radiculopathy, cervical region: Secondary | ICD-10-CM | POA: Diagnosis not present

## 2018-03-17 LAB — POCT INR: INR: 4 — AB (ref 2.0–3.0)

## 2018-03-17 NOTE — Patient Instructions (Signed)
HOLD warfarin dose today 6/10 ONLY, then resume at 1 tablet daily except for 1/2 tablet each Monday and Friday. Repeat INR in 2 weeks

## 2018-03-20 DIAGNOSIS — M9903 Segmental and somatic dysfunction of lumbar region: Secondary | ICD-10-CM | POA: Diagnosis not present

## 2018-03-20 DIAGNOSIS — M47812 Spondylosis without myelopathy or radiculopathy, cervical region: Secondary | ICD-10-CM | POA: Diagnosis not present

## 2018-03-20 DIAGNOSIS — M542 Cervicalgia: Secondary | ICD-10-CM | POA: Diagnosis not present

## 2018-03-20 DIAGNOSIS — M9901 Segmental and somatic dysfunction of cervical region: Secondary | ICD-10-CM | POA: Diagnosis not present

## 2018-03-24 DIAGNOSIS — M9903 Segmental and somatic dysfunction of lumbar region: Secondary | ICD-10-CM | POA: Diagnosis not present

## 2018-03-24 DIAGNOSIS — M9901 Segmental and somatic dysfunction of cervical region: Secondary | ICD-10-CM | POA: Diagnosis not present

## 2018-03-24 DIAGNOSIS — M47812 Spondylosis without myelopathy or radiculopathy, cervical region: Secondary | ICD-10-CM | POA: Diagnosis not present

## 2018-03-24 DIAGNOSIS — M542 Cervicalgia: Secondary | ICD-10-CM | POA: Diagnosis not present

## 2018-03-27 DIAGNOSIS — M47812 Spondylosis without myelopathy or radiculopathy, cervical region: Secondary | ICD-10-CM | POA: Diagnosis not present

## 2018-03-27 DIAGNOSIS — M9903 Segmental and somatic dysfunction of lumbar region: Secondary | ICD-10-CM | POA: Diagnosis not present

## 2018-03-27 DIAGNOSIS — M542 Cervicalgia: Secondary | ICD-10-CM | POA: Diagnosis not present

## 2018-03-27 DIAGNOSIS — M9901 Segmental and somatic dysfunction of cervical region: Secondary | ICD-10-CM | POA: Diagnosis not present

## 2018-03-28 ENCOUNTER — Ambulatory Visit (INDEPENDENT_AMBULATORY_CARE_PROVIDER_SITE_OTHER): Payer: Medicare Other | Admitting: Pharmacist

## 2018-03-28 DIAGNOSIS — I482 Chronic atrial fibrillation, unspecified: Secondary | ICD-10-CM

## 2018-03-28 DIAGNOSIS — Z7901 Long term (current) use of anticoagulants: Secondary | ICD-10-CM

## 2018-03-28 LAB — POCT INR: INR: 3.5 — AB (ref 2.0–3.0)

## 2018-04-07 ENCOUNTER — Other Ambulatory Visit: Payer: Self-pay

## 2018-04-07 DIAGNOSIS — M9901 Segmental and somatic dysfunction of cervical region: Secondary | ICD-10-CM | POA: Diagnosis not present

## 2018-04-07 DIAGNOSIS — M542 Cervicalgia: Secondary | ICD-10-CM | POA: Diagnosis not present

## 2018-04-07 DIAGNOSIS — M9903 Segmental and somatic dysfunction of lumbar region: Secondary | ICD-10-CM | POA: Diagnosis not present

## 2018-04-07 DIAGNOSIS — M47812 Spondylosis without myelopathy or radiculopathy, cervical region: Secondary | ICD-10-CM | POA: Diagnosis not present

## 2018-04-08 ENCOUNTER — Encounter: Payer: Self-pay | Admitting: Family Medicine

## 2018-04-08 ENCOUNTER — Ambulatory Visit (INDEPENDENT_AMBULATORY_CARE_PROVIDER_SITE_OTHER): Payer: Medicare Other | Admitting: Family Medicine

## 2018-04-08 VITALS — BP 138/88 | HR 72 | Temp 97.9°F | Ht 63.0 in | Wt 189.4 lb

## 2018-04-08 DIAGNOSIS — I10 Essential (primary) hypertension: Secondary | ICD-10-CM

## 2018-04-08 DIAGNOSIS — I482 Chronic atrial fibrillation, unspecified: Secondary | ICD-10-CM

## 2018-04-08 DIAGNOSIS — I2510 Atherosclerotic heart disease of native coronary artery without angina pectoris: Secondary | ICD-10-CM | POA: Diagnosis not present

## 2018-04-08 DIAGNOSIS — J4520 Mild intermittent asthma, uncomplicated: Secondary | ICD-10-CM | POA: Diagnosis not present

## 2018-04-08 NOTE — Assessment & Plan Note (Signed)
Stable, denies increased symptoms. Uses albuterol rarely.

## 2018-04-08 NOTE — Patient Instructions (Signed)
It was nice to meet you today Continue current medication I will see you back in about 4 months, we'll plan to update labs at that time.

## 2018-04-08 NOTE — Assessment & Plan Note (Signed)
Elevated CHADS-VASc score, recommend continued anticoagulation Rate controlled with diltiazem and coreg

## 2018-04-08 NOTE — Assessment & Plan Note (Signed)
History of CABG, no anginal symptoms at this time. Discussed recommendation of statin which she declines Has upcoming appt with cardiology

## 2018-04-08 NOTE — Assessment & Plan Note (Signed)
BP is fairly well controlled, continue current medications Follow low salt diet.

## 2018-04-08 NOTE — Progress Notes (Signed)
Danielle Payne - 72 y.o. female MRN [id number]  Date of birth: 1946-01-12  Subjective Chief Complaint  Patient presents with  . Establish Care    est care. not fasting. would like to medications.    HPI Danielle Payne is a 72 y.o. female with history of CAD s/p CABG, A. Fib, and HTN here today to establish care with new PCP.  She recently moved from [place].    -CAD/A. Fib/HTN:  History of CAD s/p CABG, has appt to establish with cardiology here already.  Reports she is doing well. Current medications include diltiazem(unsure of dose), carvedilol and furosemide as needed.  She is anticoagulated with warfarin for CHADS-VASc score of at least 3.  She is established with coumadin clinic here already.  She denies any s/s of bleeding.  She denies anginal symptoms, increased edema, shortness of breath, palpitations or dizziness.  She is not taking a statin medication and tells me "I don't like statins".  She does have red yeast rice supplement at home that she is contemplating using.   Had labs completed just prior to moving to [place].    -Asthma:  History of asthma that hs been well controlled.  Has albuterol but rarely uses.  She denies increased wheezing, night time cough or shortness of breath.  ROS:  ROS completed and negative except as noted per HPI No Known Allergies  Past Medical History:  Diagnosis Date  . Allergy   . Asthma   . Atrial fibrillation ([place])   . Coronary artery disease   . Hypertension   . Stented coronary artery     Past Surgical History:  Procedure Laterality Date  . ABDOMINAL HYSTERECTOMY    . CARDIAC CATHETERIZATION    . CARDIOVERSION N/A 12/21/2014   Procedure: CARDIOVERSION;  Surgeon: Adrian Prows, MD;  Location: [facility];  Service: Cardiovascular;  Laterality: N/A;  . CORONARY ANGIOPLASTY      Social History   Socioeconomic History  . Marital status: Married    Spouse name: Not on file  . Number of children: Not on file  . Years of education:  Not on file  . Highest education level: Not on file  Occupational History  . Not on file  Social Needs  . Financial resource strain: Not on file  . Food insecurity:    Worry: Not on file    Inability: Not on file  . Transportation needs:    Medical: Not on file    Non-medical: Not on file  Tobacco Use  . Smoking status: Never Smoker  Substance and Sexual Activity  . Alcohol use: No  . Drug use: No  . Sexual activity: Not on file  Lifestyle  . Physical activity:    Days per week: Not on file    Minutes per session: Not on file  . Stress: Not on file  Relationships  . Social connections:    Talks on phone: Not on file    Gets together: Not on file    Attends religious service: Not on file    Active member of club or organization: Not on file    Attends meetings of clubs or organizations: Not on file    Relationship status: Not on file  Other Topics Concern  . Not on file  Social History Narrative  . Not on file    Family History  Problem Relation Age of Onset  . CAD Mother   . Prostate cancer Father   . Diabetes Mellitus II Sister   .  Diabetes Mellitus II Brother     Health Maintenance  Topic Date Due  . Hepatitis C Screening  May 26, 1946  . MAMMOGRAM  07/24/1994  . COLONOSCOPY  07/24/1994  . DEXA SCAN  07/24/2009  . PNA vac Low Risk Adult (1 of 2 - PCV13) 07/24/2009  . INFLUENZA VACCINE  05/08/2018  . TETANUS/TDAP  07/07/2018    ----------------------------------------------------------------------------------------------------------------------------------------------------------------------------------------------------------------- Physical Exam BP 138/88 (BP Location: Left Arm, Patient Position: Sitting, Cuff Size: Normal)   Pulse 72   Temp 97.9 F (36.6 C) (Oral)   Ht 5\' 3"  (1.6 m)   Wt 189 lb 6.4 oz (85.9 kg)   SpO2 96%   BMI 33.55 kg/m   Physical Exam  Constitutional: She is oriented to person, place, and time. She appears well-nourished. No  distress.  HENT:  Head: Normocephalic and atraumatic.  Mouth/Throat: Oropharynx is clear and moist.  Eyes: No scleral icterus.  Neck: Neck supple. No thyromegaly present.  Cardiovascular: Normal rate, normal heart sounds and intact distal pulses. An irregularly irregular rhythm present.  Pulmonary/Chest: Effort normal and breath sounds normal.  Musculoskeletal: She exhibits no edema.  Lymphadenopathy:    She has no cervical adenopathy.  Neurological: She is alert and oriented to person, place, and time. No cranial nerve deficit. Coordination normal.  Skin: No rash noted.  Psychiatric: She has a normal mood and affect. Her behavior is normal.    ------------------------------------------------------------------------------------------------------------------------------------------------------------------------------------------------------------------- Assessment and Plan  CAD (coronary artery disease), native coronary artery History of CABG, no anginal symptoms at this time. Discussed recommendation of statin which she declines Has upcoming appt with cardiology  Atrial fibrillation Elevated CHADS-VASc score, recommend continued anticoagulation Rate controlled with diltiazem and coreg   Essential hypertension BP is fairly well controlled, continue current medications Follow low salt diet.   Asthma, chronic Stable, denies increased symptoms. Uses albuterol rarely.

## 2018-04-09 DIAGNOSIS — M9903 Segmental and somatic dysfunction of lumbar region: Secondary | ICD-10-CM | POA: Diagnosis not present

## 2018-04-09 DIAGNOSIS — M542 Cervicalgia: Secondary | ICD-10-CM | POA: Diagnosis not present

## 2018-04-09 DIAGNOSIS — M47812 Spondylosis without myelopathy or radiculopathy, cervical region: Secondary | ICD-10-CM | POA: Diagnosis not present

## 2018-04-09 DIAGNOSIS — M9901 Segmental and somatic dysfunction of cervical region: Secondary | ICD-10-CM | POA: Diagnosis not present

## 2018-04-11 ENCOUNTER — Ambulatory Visit (INDEPENDENT_AMBULATORY_CARE_PROVIDER_SITE_OTHER): Payer: Medicare Other | Admitting: Pharmacist

## 2018-04-11 DIAGNOSIS — Z7901 Long term (current) use of anticoagulants: Secondary | ICD-10-CM

## 2018-04-11 DIAGNOSIS — I482 Chronic atrial fibrillation, unspecified: Secondary | ICD-10-CM

## 2018-04-11 LAB — POCT INR: INR: 2.6 (ref 2.0–3.0)

## 2018-04-14 DIAGNOSIS — M9903 Segmental and somatic dysfunction of lumbar region: Secondary | ICD-10-CM | POA: Diagnosis not present

## 2018-04-14 DIAGNOSIS — M47812 Spondylosis without myelopathy or radiculopathy, cervical region: Secondary | ICD-10-CM | POA: Diagnosis not present

## 2018-04-14 DIAGNOSIS — M542 Cervicalgia: Secondary | ICD-10-CM | POA: Diagnosis not present

## 2018-04-14 DIAGNOSIS — M9901 Segmental and somatic dysfunction of cervical region: Secondary | ICD-10-CM | POA: Diagnosis not present

## 2018-04-21 DIAGNOSIS — M542 Cervicalgia: Secondary | ICD-10-CM | POA: Diagnosis not present

## 2018-04-21 DIAGNOSIS — M9903 Segmental and somatic dysfunction of lumbar region: Secondary | ICD-10-CM | POA: Diagnosis not present

## 2018-04-21 DIAGNOSIS — M9901 Segmental and somatic dysfunction of cervical region: Secondary | ICD-10-CM | POA: Diagnosis not present

## 2018-04-21 DIAGNOSIS — M47812 Spondylosis without myelopathy or radiculopathy, cervical region: Secondary | ICD-10-CM | POA: Diagnosis not present

## 2018-04-28 ENCOUNTER — Ambulatory Visit (INDEPENDENT_AMBULATORY_CARE_PROVIDER_SITE_OTHER): Payer: Medicare Other | Admitting: Pharmacist Clinician (PhC)/ Clinical Pharmacy Specialist

## 2018-04-28 DIAGNOSIS — Z7901 Long term (current) use of anticoagulants: Secondary | ICD-10-CM

## 2018-04-28 DIAGNOSIS — I482 Chronic atrial fibrillation, unspecified: Secondary | ICD-10-CM

## 2018-04-28 DIAGNOSIS — M9901 Segmental and somatic dysfunction of cervical region: Secondary | ICD-10-CM | POA: Diagnosis not present

## 2018-04-28 DIAGNOSIS — M9903 Segmental and somatic dysfunction of lumbar region: Secondary | ICD-10-CM | POA: Diagnosis not present

## 2018-04-28 DIAGNOSIS — M542 Cervicalgia: Secondary | ICD-10-CM | POA: Diagnosis not present

## 2018-04-28 DIAGNOSIS — M47812 Spondylosis without myelopathy or radiculopathy, cervical region: Secondary | ICD-10-CM | POA: Diagnosis not present

## 2018-04-28 LAB — POCT INR: INR: 2.9 (ref 2.0–3.0)

## 2018-05-05 DIAGNOSIS — M542 Cervicalgia: Secondary | ICD-10-CM | POA: Diagnosis not present

## 2018-05-05 DIAGNOSIS — M9901 Segmental and somatic dysfunction of cervical region: Secondary | ICD-10-CM | POA: Diagnosis not present

## 2018-05-05 DIAGNOSIS — M47812 Spondylosis without myelopathy or radiculopathy, cervical region: Secondary | ICD-10-CM | POA: Diagnosis not present

## 2018-05-05 DIAGNOSIS — M9903 Segmental and somatic dysfunction of lumbar region: Secondary | ICD-10-CM | POA: Diagnosis not present

## 2018-05-12 DIAGNOSIS — M9901 Segmental and somatic dysfunction of cervical region: Secondary | ICD-10-CM | POA: Diagnosis not present

## 2018-05-12 DIAGNOSIS — M9903 Segmental and somatic dysfunction of lumbar region: Secondary | ICD-10-CM | POA: Diagnosis not present

## 2018-05-12 DIAGNOSIS — M542 Cervicalgia: Secondary | ICD-10-CM | POA: Diagnosis not present

## 2018-05-12 DIAGNOSIS — M47812 Spondylosis without myelopathy or radiculopathy, cervical region: Secondary | ICD-10-CM | POA: Diagnosis not present

## 2018-05-14 ENCOUNTER — Ambulatory Visit (INDEPENDENT_AMBULATORY_CARE_PROVIDER_SITE_OTHER): Payer: Medicare Other | Admitting: Pharmacist Clinician (PhC)/ Clinical Pharmacy Specialist

## 2018-05-14 ENCOUNTER — Ambulatory Visit (INDEPENDENT_AMBULATORY_CARE_PROVIDER_SITE_OTHER): Payer: Medicare Other | Admitting: Cardiovascular Disease

## 2018-05-14 ENCOUNTER — Encounter: Payer: Self-pay | Admitting: Cardiovascular Disease

## 2018-05-14 VITALS — BP 126/78 | HR 75 | Ht 63.0 in | Wt 192.0 lb

## 2018-05-14 DIAGNOSIS — I5042 Chronic combined systolic (congestive) and diastolic (congestive) heart failure: Secondary | ICD-10-CM | POA: Insufficient documentation

## 2018-05-14 DIAGNOSIS — E7800 Pure hypercholesterolemia, unspecified: Secondary | ICD-10-CM

## 2018-05-14 DIAGNOSIS — E785 Hyperlipidemia, unspecified: Secondary | ICD-10-CM

## 2018-05-14 DIAGNOSIS — Z13220 Encounter for screening for lipoid disorders: Secondary | ICD-10-CM | POA: Diagnosis not present

## 2018-05-14 DIAGNOSIS — Z7901 Long term (current) use of anticoagulants: Secondary | ICD-10-CM

## 2018-05-14 DIAGNOSIS — I509 Heart failure, unspecified: Secondary | ICD-10-CM | POA: Diagnosis not present

## 2018-05-14 DIAGNOSIS — I2510 Atherosclerotic heart disease of native coronary artery without angina pectoris: Secondary | ICD-10-CM | POA: Diagnosis not present

## 2018-05-14 DIAGNOSIS — I482 Chronic atrial fibrillation, unspecified: Secondary | ICD-10-CM

## 2018-05-14 DIAGNOSIS — Z5181 Encounter for therapeutic drug level monitoring: Secondary | ICD-10-CM | POA: Diagnosis not present

## 2018-05-14 HISTORY — DX: Pure hypercholesterolemia, unspecified: E78.00

## 2018-05-14 HISTORY — DX: Chronic combined systolic (congestive) and diastolic (congestive) heart failure: I50.42

## 2018-05-14 LAB — POCT INR: INR: 2.2 (ref 2.0–3.0)

## 2018-05-14 NOTE — Progress Notes (Signed)
Cardiology Office Note   Date:  05/14/2018   ID:  Jadine Brumley, DOB 04/02/1946, MRN [id number]  PCP:  Luetta Nutting, DO  Cardiologist:   Skeet Latch, MD   Chief Complaint  Patient presents with  . Follow-up      History of Present Illness: Renu Asby is a 72 y.o. female with CAD s/p CABG (LIMA-->LAD, SVG-->OM, SVG-->PDA), chronic systolic and diastolic heart failure, hypertension, persistent atrial fibrillation, diabetes, and asthma,  who is here to establish care.  She was first diagnosed with atrial fibrillation in 2010 when she presented with afib with RVR.  She had an NSTEMI and had a Promus DES to the LCx.  She had a recurrent episode of symptomatic atrial fibrillation 03/2011.  She also required DCCV with Dr. Nadyne Coombes 12/2014.  She presented with recurrent NSTEMI  09/2016 and underwent 3 vessel CABG 09/25/16.  That surgery was complicated by recurrent pleural effusions that required thoracentesis.  Echo at that time revealed LVEF 35-40%.  Since that time she has felt well.  She has occasional heart fluttering that is not sustained.  She has not experienced any chest pain or shortness of breath.  She has no lower extremity edema, orthopnea, or PND.  She exercises at the [facility] 3 days/week and does water aerobics.  She has not exertional symptoms.  She recently moved here from [place] and presents today to establish care.   Past Medical History:  Diagnosis Date  . Allergy   . Asthma   . Atrial fibrillation ([place])   . Coronary artery disease   . Hypertension   . Stented coronary artery     Past Surgical History:  Procedure Laterality Date  . ABDOMINAL HYSTERECTOMY    . CARDIAC CATHETERIZATION    . CARDIOVERSION N/A 12/21/2014   Procedure: CARDIOVERSION;  Surgeon: Adrian Prows, MD;  Location: [facility];  Service: Cardiovascular;  Laterality: N/A;  . CORONARY ANGIOPLASTY    . CORONARY ARTERY BYPASS GRAFT       Current Outpatient Medications  Medication Sig  Dispense Refill  . albuterol (PROVENTIL HFA;VENTOLIN HFA) 108 (90 BASE) MCG/ACT inhaler Inhale 2 puffs into the lungs every 4 (four) hours as needed for wheezing or shortness of breath.    . carvedilol (COREG) 25 MG tablet Take 25 mg by mouth 2 (two) times daily with a meal.    . diltiazem (CARDIZEM CD) 180 MG 24 hr capsule Take 180 mg by mouth daily.    . furosemide (LASIX) 20 MG tablet Take 20 mg by mouth as needed.    Marland Kitchen OVER THE COUNTER MEDICATION Take 1 capsule by mouth at bedtime. Sleep Essentials    . warfarin (COUMADIN) 5 MG tablet Take 5 mg by mouth daily. Take 2.5 mg (1/2 tablet) Monday, Wednesday, and Friday and take 5 mg (1 whole tablet) all other days.     No current facility-administered medications for this visit.     Allergies:   Patient has no known allergies.    Social History:  The patient  reports that she has never smoked. She has never used smokeless tobacco. She reports that she does not drink alcohol or use drugs.   Family History:  The patient's family history includes CAD in her maternal aunt and mother; Diabetes Mellitus II in her brother and sister; Hypertension in her mother; Prostate cancer in her father; Stroke in her mother.    ROS:  Please see the history of present illness.   Otherwise, review of systems  are positive for none.   All other systems are reviewed and negative.    PHYSICAL EXAM: VS:  BP 126/78 (BP Location: Left Arm, Patient Position: Sitting, Cuff Size: Normal)   Pulse 75   Ht 5\' 3"  (1.6 m)   Wt 192 lb (87.1 kg)   BMI 34.01 kg/m  , BMI Body mass index is 34.01 kg/m. GENERAL:  Well appearing HEENT:  Pupils equal round and reactive, fundi not visualized, oral mucosa unremarkable NECK:  No jugular venous distention, waveform within normal limits, carotid upstroke brisk and symmetric, no bruits LUNGS:  Clear to auscultation bilaterally HEART:  RRR.  PMI not displaced or sustained,S1 and S2 within normal limits, no S3, no S4, no clicks, no  rubs,  murmurs ABD:  Flat, positive bowel sounds normal in frequency in pitch, no bruits, no rebound, no guarding, no midline pulsatile mass, no hepatomegaly, no splenomegaly EXT:  2 plus pulses throughout, no edema, no cyanosis no clubbing SKIN:  No rashes no nodules NEURO:  Cranial nerves II through XII grossly intact, motor grossly intact throughout PSYCH:  Cognitively intact, oriented to person place and time    EKG:  EKG is ordered today. The ekg ordered today demonstrates atrail fibrillation rate 75 bpm.  Prior inferior infarct  LHC 11/12/08: 90% mLCx.  X/p 3.0 28 mm Promus DES to mid Lcx.  LVEF 68^ Stress echo 09/01/2009: 7 min 16s on Bruce protocol.  LVEF 60-65%.  No ischemia. Echo 12/2009: LVEF 65-70%.  Moderate LVH.  Mild LVOT obstruction. Echo 04/03/11: LVEF 65-70%.  Moderate LVH.  RVSP 20 mmHg  Recent Labs: No results found for requested labs within last 8760 hours.    Lipid Panel No results found for: CHOL, TRIG, HDL, CHOLHDL, VLDL, LDLCALC, LDLDIRECT    Wt Readings from Last 3 Encounters:  05/14/18 192 lb (87.1 kg)  04/08/18 189 lb 6.4 oz (85.9 kg)  12/21/14 178 lb (80.7 kg)      ASSESSMENT AND PLAN:  # Chronic systolic and diastolic heart failure: Ms. Malicki is euvolemic. She hasn't had a repeat echo since the time of her NSTEMI at which time her LVEF was 35-40%.  Check echo.  Continue carvedilol.  If her LVEF remains low she should be on an ARB or Entresto.  # CAD s/p NSTEMI/PCI/CABG: # Hyperlipidemia: No recent symptoms.  Warfarin statins in the past.  We will check her lipids and she will likely need a statin to reach an LDL goal less than 70.  # Essential hypertension: Blood pressure well-controlled on carvedilol and diltiazem.  # Longstanding persistent atrial fibrillation:  Rate well-controlled.  Continue diltiazem and carvedilol.  If her systolic function remains reduced she should be switched off of diltiazem.  Continue warfarin.  We now manage her  INRs.  Check CBC, CMP TSH, free T4, and magnesium.  Current medicines are reviewed at length with the patient today.  The patient does not have concerns regarding medicines.  The following changes have been made:  no change  Labs/ tests ordered today include:   Orders Placed This Encounter  Procedures  . CBC with Differential/Platelet  . T4, free  . TSH  . Lipid panel  . Comprehensive metabolic panel  . Magnesium  . EKG 12-Lead  . ECHOCARDIOGRAM COMPLETE     Disposition:   FU with Burlie Cajamarca C. Oval Linsey, MD, [facility]  In 3 months    Signed, Novalie Leamy C. Oval Linsey, MD, [facility]  05/14/2018 1:32 PM    [facility]

## 2018-05-14 NOTE — Patient Instructions (Addendum)
Medication Instructions:  Your physician recommends that you continue on your current medications as directed. Please refer to the Current Medication list given to you today.  Labwork: FASTING LP/CMET/MAGNESIUM/TSH/FT4/CBC   Testing/Procedures: Your physician has requested that you have an echocardiogram. Echocardiography is a painless test that uses sound waves to create images of your heart. It provides your doctor with information about the size and shape of your heart and how well your heart's chambers and valves are working. This procedure takes approximately one hour. There are no restrictions for this procedure. [place] [address]  Follow-Up: Your physician recommends that you schedule a follow-up appointment in: 3 MONTH   Any Other Special Instructions Will Be Listed Below (If Applicable).  MONITOR YOUR BLOOD PRESSURE, BRING YOUR READINGS AND MACHINE TO FOLLOW UP   If you need a refill on your cardiac medications before your next appointment, please call your pharmacy.

## 2018-05-15 DIAGNOSIS — Z13220 Encounter for screening for lipoid disorders: Secondary | ICD-10-CM | POA: Diagnosis not present

## 2018-05-15 DIAGNOSIS — Z5181 Encounter for therapeutic drug level monitoring: Secondary | ICD-10-CM | POA: Diagnosis not present

## 2018-05-15 DIAGNOSIS — E785 Hyperlipidemia, unspecified: Secondary | ICD-10-CM | POA: Diagnosis not present

## 2018-05-15 DIAGNOSIS — I482 Chronic atrial fibrillation: Secondary | ICD-10-CM | POA: Diagnosis not present

## 2018-05-15 LAB — CBC WITH DIFFERENTIAL/PLATELET
BASOS ABS: 0 10*3/uL (ref 0.0–0.2)
Basos: 0 %
EOS (ABSOLUTE): 0.2 10*3/uL (ref 0.0–0.4)
Eos: 3 %
Hematocrit: 42.9 % (ref 34.0–46.6)
Hemoglobin: 14.4 g/dL (ref 11.1–15.9)
IMMATURE GRANS (ABS): 0 10*3/uL (ref 0.0–0.1)
Immature Granulocytes: 0 %
LYMPHS: 32 %
Lymphocytes Absolute: 2.5 10*3/uL (ref 0.7–3.1)
MCH: 29.8 pg (ref 26.6–33.0)
MCHC: 33.6 g/dL (ref 31.5–35.7)
MCV: 89 fL (ref 79–97)
Monocytes Absolute: 0.7 10*3/uL (ref 0.1–0.9)
Monocytes: 9 %
Neutrophils Absolute: 4.5 10*3/uL (ref 1.4–7.0)
Neutrophils: 56 %
PLATELETS: 258 10*3/uL (ref 150–450)
RBC: 4.83 x10E6/uL (ref 3.77–5.28)
RDW: 14.9 % (ref 12.3–15.4)
WBC: 7.9 10*3/uL (ref 3.4–10.8)

## 2018-05-15 LAB — COMPREHENSIVE METABOLIC PANEL
A/G RATIO: 1.3 (ref 1.2–2.2)
ALT: 22 IU/L (ref 0–32)
AST: 16 IU/L (ref 0–40)
Albumin: 4.2 g/dL (ref 3.5–4.8)
Alkaline Phosphatase: 81 IU/L (ref 39–117)
BUN/Creatinine Ratio: 22 (ref 12–28)
BUN: 16 mg/dL (ref 8–27)
Bilirubin Total: 0.5 mg/dL (ref 0.0–1.2)
CALCIUM: 8.9 mg/dL (ref 8.7–10.3)
CO2: 22 mmol/L (ref 20–29)
Chloride: 101 mmol/L (ref 96–106)
Creatinine, Ser: 0.72 mg/dL (ref 0.57–1.00)
GFR, EST AFRICAN AMERICAN: 96 mL/min/{1.73_m2} (ref >59)
GFR, EST NON AFRICAN AMERICAN: 83 mL/min/{1.73_m2} (ref >59)
Globulin, Total: 3.2 g/dL (ref 1.5–4.5)
Glucose: 131 mg/dL — ABNORMAL HIGH (ref 65–99)
POTASSIUM: 4.1 mmol/L (ref 3.5–5.2)
Sodium: 138 mmol/L (ref 134–144)
TOTAL PROTEIN: 7.4 g/dL (ref 6.0–8.5)

## 2018-05-15 LAB — T4, FREE: FREE T4: 1.32 ng/dL (ref 0.82–1.77)

## 2018-05-15 LAB — LIPID PANEL
Chol/HDL Ratio: 5.5 ratio — ABNORMAL HIGH (ref 0.0–4.4)
Cholesterol, Total: 203 mg/dL — ABNORMAL HIGH (ref 100–199)
HDL: 37 mg/dL — ABNORMAL LOW (ref >39)
LDL Calculated: 125 mg/dL — ABNORMAL HIGH (ref 0–99)
Triglycerides: 206 mg/dL — ABNORMAL HIGH (ref 0–149)
VLDL Cholesterol Cal: 41 mg/dL — ABNORMAL HIGH (ref 5–40)

## 2018-05-15 LAB — MAGNESIUM: Magnesium: 1.9 mg/dL (ref 1.6–2.3)

## 2018-05-15 LAB — TSH: TSH: 1.37 u[IU]/mL (ref 0.450–4.500)

## 2018-05-19 DIAGNOSIS — M9901 Segmental and somatic dysfunction of cervical region: Secondary | ICD-10-CM | POA: Diagnosis not present

## 2018-05-19 DIAGNOSIS — M9903 Segmental and somatic dysfunction of lumbar region: Secondary | ICD-10-CM | POA: Diagnosis not present

## 2018-05-19 DIAGNOSIS — M47812 Spondylosis without myelopathy or radiculopathy, cervical region: Secondary | ICD-10-CM | POA: Diagnosis not present

## 2018-05-19 DIAGNOSIS — M542 Cervicalgia: Secondary | ICD-10-CM | POA: Diagnosis not present

## 2018-05-26 ENCOUNTER — Ambulatory Visit (HOSPITAL_COMMUNITY): Payer: Medicare Other | Attending: Cardiovascular Disease

## 2018-05-26 ENCOUNTER — Other Ambulatory Visit: Payer: Self-pay

## 2018-05-26 DIAGNOSIS — Z720 Tobacco use: Secondary | ICD-10-CM | POA: Diagnosis not present

## 2018-05-26 DIAGNOSIS — I110 Hypertensive heart disease with heart failure: Secondary | ICD-10-CM | POA: Insufficient documentation

## 2018-05-26 DIAGNOSIS — I2510 Atherosclerotic heart disease of native coronary artery without angina pectoris: Secondary | ICD-10-CM | POA: Diagnosis not present

## 2018-05-26 DIAGNOSIS — I509 Heart failure, unspecified: Secondary | ICD-10-CM | POA: Insufficient documentation

## 2018-05-26 DIAGNOSIS — E669 Obesity, unspecified: Secondary | ICD-10-CM | POA: Diagnosis not present

## 2018-05-26 DIAGNOSIS — E119 Type 2 diabetes mellitus without complications: Secondary | ICD-10-CM | POA: Insufficient documentation

## 2018-05-26 DIAGNOSIS — I348 Other nonrheumatic mitral valve disorders: Secondary | ICD-10-CM | POA: Insufficient documentation

## 2018-05-26 DIAGNOSIS — E785 Hyperlipidemia, unspecified: Secondary | ICD-10-CM | POA: Diagnosis not present

## 2018-06-02 DIAGNOSIS — M9903 Segmental and somatic dysfunction of lumbar region: Secondary | ICD-10-CM | POA: Diagnosis not present

## 2018-06-02 DIAGNOSIS — M9901 Segmental and somatic dysfunction of cervical region: Secondary | ICD-10-CM | POA: Diagnosis not present

## 2018-06-02 DIAGNOSIS — M542 Cervicalgia: Secondary | ICD-10-CM | POA: Diagnosis not present

## 2018-06-02 DIAGNOSIS — M47812 Spondylosis without myelopathy or radiculopathy, cervical region: Secondary | ICD-10-CM | POA: Diagnosis not present

## 2018-06-03 ENCOUNTER — Telehealth: Payer: Self-pay | Admitting: Cardiovascular Disease

## 2018-06-03 MED ORDER — WARFARIN SODIUM 5 MG PO TABS: ORAL_TABLET | ORAL | 3 refills | Status: DC

## 2018-06-03 NOTE — Telephone Encounter (Signed)
°*  STAT* If patient is at the pharmacy, call can be transferred to refill team.   1. Which medications need to be refilled? (please list name of each medication and dose if known) Jantoven mg    2. Which pharmacy/location (including street and city if local pharmacy) is medication to be sent to? [facility] Oppen[facility] at [facility]  3. Do they need a 30 day or 90 day supply? [place]

## 2018-06-10 ENCOUNTER — Telehealth: Payer: Self-pay | Admitting: Family Medicine

## 2018-06-10 DIAGNOSIS — M47812 Spondylosis without myelopathy or radiculopathy, cervical region: Secondary | ICD-10-CM | POA: Diagnosis not present

## 2018-06-10 DIAGNOSIS — M9903 Segmental and somatic dysfunction of lumbar region: Secondary | ICD-10-CM | POA: Diagnosis not present

## 2018-06-10 DIAGNOSIS — M9901 Segmental and somatic dysfunction of cervical region: Secondary | ICD-10-CM | POA: Diagnosis not present

## 2018-06-10 DIAGNOSIS — M542 Cervicalgia: Secondary | ICD-10-CM | POA: Diagnosis not present

## 2018-06-10 NOTE — Telephone Encounter (Signed)
Pt came into office requesting rx refill for flovent hfa and proair hfa. Patient requesting rx be sent to [facility] in [place].

## 2018-06-11 ENCOUNTER — Ambulatory Visit (INDEPENDENT_AMBULATORY_CARE_PROVIDER_SITE_OTHER): Payer: Medicare Other | Admitting: Pharmacist Clinician (PhC)/ Clinical Pharmacy Specialist

## 2018-06-11 ENCOUNTER — Other Ambulatory Visit: Payer: Self-pay | Admitting: Emergency Medicine

## 2018-06-11 DIAGNOSIS — I482 Chronic atrial fibrillation, unspecified: Secondary | ICD-10-CM

## 2018-06-11 DIAGNOSIS — Z7901 Long term (current) use of anticoagulants: Secondary | ICD-10-CM | POA: Diagnosis not present

## 2018-06-11 LAB — POCT INR: INR: 1.2 — AB (ref 2.0–3.0)

## 2018-06-11 MED ORDER — ALBUTEROL SULFATE HFA 108 (90 BASE) MCG/ACT IN AERS: 2.0000 | INHALATION_SPRAY | RESPIRATORY_TRACT | 1 refills | Status: DC | PRN

## 2018-06-11 NOTE — Telephone Encounter (Signed)
Patient is requesting a refill for her Flovent and Proair inhalers. Patient was last seen 04/08/18. Would you like for me to send this medication in? Please advise.

## 2018-06-11 NOTE — Telephone Encounter (Signed)
Left a VM for patient informing her that medication has been sent into patient pharmacy. Nothing further needed.

## 2018-06-11 NOTE — Telephone Encounter (Signed)
Ok to send in.  

## 2018-06-16 DIAGNOSIS — M542 Cervicalgia: Secondary | ICD-10-CM | POA: Diagnosis not present

## 2018-06-16 DIAGNOSIS — M9903 Segmental and somatic dysfunction of lumbar region: Secondary | ICD-10-CM | POA: Diagnosis not present

## 2018-06-16 DIAGNOSIS — M9901 Segmental and somatic dysfunction of cervical region: Secondary | ICD-10-CM | POA: Diagnosis not present

## 2018-06-16 DIAGNOSIS — M47812 Spondylosis without myelopathy or radiculopathy, cervical region: Secondary | ICD-10-CM | POA: Diagnosis not present

## 2018-06-23 DIAGNOSIS — M542 Cervicalgia: Secondary | ICD-10-CM | POA: Diagnosis not present

## 2018-06-23 DIAGNOSIS — M9901 Segmental and somatic dysfunction of cervical region: Secondary | ICD-10-CM | POA: Diagnosis not present

## 2018-06-23 DIAGNOSIS — M47812 Spondylosis without myelopathy or radiculopathy, cervical region: Secondary | ICD-10-CM | POA: Diagnosis not present

## 2018-06-23 DIAGNOSIS — M9903 Segmental and somatic dysfunction of lumbar region: Secondary | ICD-10-CM | POA: Diagnosis not present

## 2018-06-25 ENCOUNTER — Encounter: Payer: Self-pay | Admitting: Family Medicine

## 2018-06-25 ENCOUNTER — Ambulatory Visit: Payer: Medicare Other | Admitting: Family Medicine

## 2018-06-25 ENCOUNTER — Ambulatory Visit (INDEPENDENT_AMBULATORY_CARE_PROVIDER_SITE_OTHER): Payer: Medicare Other | Admitting: Family Medicine

## 2018-06-25 VITALS — BP 132/90 | HR 83 | Temp 97.7°F | Ht 63.0 in | Wt 195.2 lb

## 2018-06-25 DIAGNOSIS — R300 Dysuria: Secondary | ICD-10-CM | POA: Diagnosis not present

## 2018-06-25 LAB — POC URINALSYSI DIPSTICK (AUTOMATED)
BILIRUBIN UA: NEGATIVE
Glucose, UA: NEGATIVE
KETONES UA: NEGATIVE
Leukocytes, UA: NEGATIVE
NITRITE UA: NEGATIVE
PH UA: 5.5 (ref 5.0–8.0)
Protein, UA: NEGATIVE
RBC UA: NEGATIVE
Urobilinogen, UA: 0.2 E.U./dL

## 2018-06-25 MED ORDER — FLUCONAZOLE 150 MG PO TABS: 150.0000 mg | ORAL_TABLET | Freq: Once | ORAL | 0 refills | Status: AC

## 2018-06-25 NOTE — Patient Instructions (Signed)
Start a probiotic Be sure to stay well hydrated and eat plenty of fiber Let me know if symptoms are not improving with fluconazole tablet.

## 2018-06-25 NOTE — Assessment & Plan Note (Signed)
UA is normal.  Likely related to yeast given recent antibiotic yeast. Will cover with diflucan x1.   Reminded to keep appt for warfarin check next week as this can affect levels.  Recommend probiotic/fiber supplement to help with GI cramping.

## 2018-06-25 NOTE — Progress Notes (Signed)
Danielle Payne - 72 y.o. female MRN [id number]  Date of birth: 1946/09/15  Subjective Chief Complaint  Patient presents with  . Dysuria    HPI [name] is a 72 y.o. female here today with complaint of dysuria.  Reports that symptoms began 3 days ago.  Has had some associated lower abdominal cramping and mild vulvar irritation.  She denies vaginal discharge, urinary frequency, hematuria, fever, chills or flank/back pain.  She did recently complete two courses of antibiotics for an infected tooth (cephalexin and amoxicillin).  She denies any other GI symptoms including diarrhea or constipation.    ROS:  A comprehensive ROS was completed and negative except as noted per HPI.   No Known Allergies  Past Medical History:  Diagnosis Date  . Allergy   . Asthma   . Atrial fibrillation ([place])   . Chronic combined systolic and diastolic heart failure ([place]) 05/14/2018  . Coronary artery disease   . Hypertension   . Pure hypercholesterolemia 05/14/2018  . Stented coronary artery     Past Surgical History:  Procedure Laterality Date  . ABDOMINAL HYSTERECTOMY    . CARDIAC CATHETERIZATION    . CARDIOVERSION N/A 12/21/2014   Procedure: CARDIOVERSION;  Surgeon: Adrian Prows, MD;  Location: [facility];  Service: Cardiovascular;  Laterality: N/A;  . CORONARY ANGIOPLASTY    . CORONARY ARTERY BYPASS GRAFT      Social History   Socioeconomic History  . Marital status: Married    Spouse name: Not on file  . Number of children: Not on file  . Years of education: Not on file  . Highest education level: Not on file  Occupational History  . Not on file  Social Needs  . Financial resource strain: Not on file  . Food insecurity:    Worry: Not on file    Inability: Not on file  . Transportation needs:    Medical: Not on file    Non-medical: Not on file  Tobacco Use  . Smoking status: Never Smoker  . Smokeless tobacco: Never Used  Substance and Sexual Activity  . Alcohol use: No  . Drug  use: No  . Sexual activity: Not on file  Lifestyle  . Physical activity:    Days per week: Not on file    Minutes per session: Not on file  . Stress: Not on file  Relationships  . Social connections:    Talks on phone: Not on file    Gets together: Not on file    Attends religious service: Not on file    Active member of club or organization: Not on file    Attends meetings of clubs or organizations: Not on file    Relationship status: Not on file  Other Topics Concern  . Not on file  Social History Narrative  . Not on file    Family History  Problem Relation Age of Onset  . CAD Mother   . Stroke Mother   . Hypertension Mother   . Prostate cancer Father   . Diabetes Mellitus II Sister   . Diabetes Mellitus II Brother   . CAD Maternal Aunt     Health Maintenance  Topic Date Due  . Hepatitis C Screening  01-21-1946  . MAMMOGRAM  07/24/1994  . COLONOSCOPY  07/24/1994  . DEXA SCAN  07/24/2009  . PNA vac Low Risk Adult (1 of 2 - PCV13) 07/24/2009  . INFLUENZA VACCINE  05/08/2018  . TETANUS/TDAP  07/07/2018    ----------------------------------------------------------------------------------------------------------------------------------------------------------------------------------------------------------------- Physical  Exam BP 132/90 (BP Location: Left Arm, Patient Position: Sitting, Cuff Size: Large)   Pulse 83   Temp 97.7 F (36.5 C) (Oral)   Ht 5\' 3"  (1.6 m)   Wt 195 lb 3.2 oz (88.5 kg)   BMI 34.58 kg/m   Physical Exam  Constitutional: She appears well-nourished. No distress.  HENT:  Head: Normocephalic and atraumatic.  Mouth/Throat: Oropharynx is clear and moist.  Eyes: No scleral icterus.  Neck: Neck supple. No thyromegaly present.  Cardiovascular: Normal rate, regular rhythm and normal heart sounds.  Pulmonary/Chest: Effort normal and breath sounds normal.  Abdominal: Soft. Bowel sounds are normal. She exhibits no distension. There is no  tenderness.  Neurological: She is alert.  Skin: Skin is warm and dry. No rash noted.  Psychiatric: She has a normal mood and affect. Her behavior is normal.    ------------------------------------------------------------------------------------------------------------------------------------------------------------------------------------------------------------------- Assessment and Plan  Dysuria UA is normal.  Likely related to yeast given recent antibiotic yeast. Will cover with diflucan x1.   Reminded to keep appt for warfarin check next week as this can affect levels.  Recommend probiotic/fiber supplement to help with GI cramping.

## 2018-06-30 ENCOUNTER — Telehealth: Payer: Self-pay | Admitting: Pharmacist Clinician (PhC)/ Clinical Pharmacy Specialist

## 2018-06-30 NOTE — Telephone Encounter (Signed)
Patient was given fluconazole 150 mg x 1 last Thursday.  Took only 1/2 tablet warfarin that day  (normal full tab day).  Has INR check for Wednesday.  Advised patient to continue with normal dose and keep Wednesday appointment.  Patient voiced understanding.

## 2018-07-02 ENCOUNTER — Ambulatory Visit (INDEPENDENT_AMBULATORY_CARE_PROVIDER_SITE_OTHER): Payer: Medicare Other | Admitting: Pharmacist

## 2018-07-02 DIAGNOSIS — I482 Chronic atrial fibrillation, unspecified: Secondary | ICD-10-CM

## 2018-07-02 DIAGNOSIS — Z7901 Long term (current) use of anticoagulants: Secondary | ICD-10-CM | POA: Diagnosis not present

## 2018-07-02 LAB — POCT INR: INR: 2.7 (ref 2.0–3.0)

## 2018-07-14 DIAGNOSIS — M47812 Spondylosis without myelopathy or radiculopathy, cervical region: Secondary | ICD-10-CM | POA: Diagnosis not present

## 2018-07-14 DIAGNOSIS — M9901 Segmental and somatic dysfunction of cervical region: Secondary | ICD-10-CM | POA: Diagnosis not present

## 2018-07-14 DIAGNOSIS — M542 Cervicalgia: Secondary | ICD-10-CM | POA: Diagnosis not present

## 2018-07-14 DIAGNOSIS — M9903 Segmental and somatic dysfunction of lumbar region: Secondary | ICD-10-CM | POA: Diagnosis not present

## 2018-07-21 DIAGNOSIS — M542 Cervicalgia: Secondary | ICD-10-CM | POA: Diagnosis not present

## 2018-07-21 DIAGNOSIS — M9901 Segmental and somatic dysfunction of cervical region: Secondary | ICD-10-CM | POA: Diagnosis not present

## 2018-07-21 DIAGNOSIS — M47812 Spondylosis without myelopathy or radiculopathy, cervical region: Secondary | ICD-10-CM | POA: Diagnosis not present

## 2018-07-21 DIAGNOSIS — M9903 Segmental and somatic dysfunction of lumbar region: Secondary | ICD-10-CM | POA: Diagnosis not present

## 2018-07-28 DIAGNOSIS — M47812 Spondylosis without myelopathy or radiculopathy, cervical region: Secondary | ICD-10-CM | POA: Diagnosis not present

## 2018-07-28 DIAGNOSIS — M9901 Segmental and somatic dysfunction of cervical region: Secondary | ICD-10-CM | POA: Diagnosis not present

## 2018-07-28 DIAGNOSIS — M9903 Segmental and somatic dysfunction of lumbar region: Secondary | ICD-10-CM | POA: Diagnosis not present

## 2018-07-28 DIAGNOSIS — M542 Cervicalgia: Secondary | ICD-10-CM | POA: Diagnosis not present

## 2018-07-30 ENCOUNTER — Ambulatory Visit (INDEPENDENT_AMBULATORY_CARE_PROVIDER_SITE_OTHER): Payer: Medicare Other | Admitting: Pharmacist Clinician (PhC)/ Clinical Pharmacy Specialist

## 2018-07-30 DIAGNOSIS — Z7901 Long term (current) use of anticoagulants: Secondary | ICD-10-CM | POA: Diagnosis not present

## 2018-07-30 DIAGNOSIS — I4891 Unspecified atrial fibrillation: Secondary | ICD-10-CM | POA: Diagnosis not present

## 2018-07-30 LAB — POCT INR: INR: 1.8 — AB (ref 2.0–3.0)

## 2018-08-04 DIAGNOSIS — M9903 Segmental and somatic dysfunction of lumbar region: Secondary | ICD-10-CM | POA: Diagnosis not present

## 2018-08-04 DIAGNOSIS — M47812 Spondylosis without myelopathy or radiculopathy, cervical region: Secondary | ICD-10-CM | POA: Diagnosis not present

## 2018-08-04 DIAGNOSIS — M542 Cervicalgia: Secondary | ICD-10-CM | POA: Diagnosis not present

## 2018-08-04 DIAGNOSIS — M9901 Segmental and somatic dysfunction of cervical region: Secondary | ICD-10-CM | POA: Diagnosis not present

## 2018-08-11 ENCOUNTER — Encounter: Payer: Self-pay | Admitting: Family Medicine

## 2018-08-11 ENCOUNTER — Ambulatory Visit (INDEPENDENT_AMBULATORY_CARE_PROVIDER_SITE_OTHER): Payer: Medicare Other

## 2018-08-11 ENCOUNTER — Ambulatory Visit (INDEPENDENT_AMBULATORY_CARE_PROVIDER_SITE_OTHER): Payer: Medicare Other | Admitting: Family Medicine

## 2018-08-11 VITALS — BP 132/82 | HR 101 | Temp 98.5°F | Ht 63.0 in | Wt 194.6 lb

## 2018-08-11 DIAGNOSIS — R062 Wheezing: Secondary | ICD-10-CM

## 2018-08-11 DIAGNOSIS — I10 Essential (primary) hypertension: Secondary | ICD-10-CM | POA: Diagnosis not present

## 2018-08-11 DIAGNOSIS — R7309 Other abnormal glucose: Secondary | ICD-10-CM | POA: Diagnosis not present

## 2018-08-11 LAB — POCT GLYCOSYLATED HEMOGLOBIN (HGB A1C): HEMOGLOBIN A1C: 6.4 % — AB (ref 4.0–5.6)

## 2018-08-11 NOTE — Progress Notes (Signed)
Danielle Payne - 72 y.o. female MRN [id number]  Date of birth: 02-11-1946  Subjective Chief Complaint  Patient presents with  . Hypertension    HPI Danielle Payne is a 72 y.o. female with history of HTN, CAD s/p NSTEMI with CABG, A. Fib, HLD and asthma here today for follow up of hypertension.    -HTN:   BP is high upon arrival today, reports taking medication just prior to arriving to clinic.  BP has been well controlled on medications.  She does report she has felt a little sluggish with medication and is concerned about difficulty losing weight.  She swims 3x per week for exercise and plans to see a personal trainer to help with some light strength training.  She denies anginal symptoms with exercise.  She has noticed some wheezing at times and is unsure if this is related to her asthma or fluid on her lungs.  She is only taking lasix intermittently.  She denies cough, orthopnea, or edema.  Her weight has been relatively stable.    She did also have labs completed with her cardiologist in 05/2018.  Her glucose was elevated on this lab work.  She denies polyuria/polydipsia.    ROS:  A comprehensive ROS was completed and negative except as noted per HPI  No Known Allergies  Past Medical History:  Diagnosis Date  . Allergy   . Asthma   . Atrial fibrillation (Danielle Payne)   . Chronic combined systolic and diastolic heart failure (Danielle Payne) 05/14/2018  . Coronary artery disease   . Hypertension   . Pure hypercholesterolemia 05/14/2018  . Stented coronary artery     Past Surgical History:  Procedure Laterality Date  . ABDOMINAL HYSTERECTOMY    . CARDIAC CATHETERIZATION    . CARDIOVERSION N/A 12/21/2014   Procedure: CARDIOVERSION;  Surgeon: Adrian Prows, MD;  Location: [facility];  Service: Cardiovascular;  Laterality: N/A;  . CORONARY ANGIOPLASTY    . CORONARY ARTERY BYPASS GRAFT      Social History   Socioeconomic History  . Marital status: Married    Spouse name: Not on file  . Number of  children: Not on file  . Years of education: Not on file  . Highest education level: Not on file  Occupational History  . Not on file  Social Needs  . Financial resource strain: Not on file  . Food insecurity:    Worry: Not on file    Inability: Not on file  . Transportation needs:    Medical: Not on file    Non-medical: Not on file  Tobacco Use  . Smoking status: Never Smoker  . Smokeless tobacco: Never Used  Substance and Sexual Activity  . Alcohol use: No  . Drug use: No  . Sexual activity: Not on file  Lifestyle  . Physical activity:    Days per week: Not on file    Minutes per session: Not on file  . Stress: Not on file  Relationships  . Social connections:    Talks on phone: Not on file    Gets together: Not on file    Attends religious service: Not on file    Active member of club or organization: Not on file    Attends meetings of clubs or organizations: Not on file    Relationship status: Not on file  Other Topics Concern  . Not on file  Social History Narrative  . Not on file    Family History  Problem Relation Age of Onset  .  CAD Mother   . Stroke Mother   . Hypertension Mother   . Prostate cancer Father   . Diabetes Mellitus II Sister   . Diabetes Mellitus II Brother   . CAD Maternal Aunt     Health Maintenance  Topic Date Due  . Hepatitis C Screening  September 30, 1946  . MAMMOGRAM  07/24/1994  . COLONOSCOPY  07/24/1994  . DEXA SCAN  07/24/2009  . PNA vac Low Risk Adult (1 of 2 - PCV13) 07/24/2009  . INFLUENZA VACCINE  05/08/2018  . TETANUS/TDAP  07/07/2018    ----------------------------------------------------------------------------------------------------------------------------------------------------------------------------------------------------------------- Physical Exam BP 132/82 Comment: Checked manually, CM  Pulse (!) 101   Temp 98.5 F (36.9 C) (Oral)   Ht 5\' 3"  (1.6 m)   Wt 194 lb 9.6 oz (88.3 kg)   SpO2 98%   BMI 34.47 kg/m     Physical Exam  Constitutional: She is oriented to person, place, and time. She appears well-nourished. No distress.  HENT:  Head: Normocephalic and atraumatic.  Mouth/Throat: Oropharynx is clear and moist.  Eyes: No scleral icterus.  Neck: Neck supple. No thyromegaly present.  Cardiovascular: Normal rate, regular rhythm and normal heart sounds.  Pulmonary/Chest: Effort normal and breath sounds normal. No respiratory distress. She has no wheezes.  Musculoskeletal: She exhibits no tenderness.  Lymphadenopathy:    She has no cervical adenopathy.  Neurological: She is alert and oriented to person, place, and time.  Skin: Skin is warm and dry.  Psychiatric: She has a normal mood and affect. Her behavior is normal.    ------------------------------------------------------------------------------------------------------------------------------------------------------------------------------------------------------------------- Assessment and Plan  Wheezing -No wheezing noted on exam today.  -Has only been using lasix intermittently and reports weight is up on her scales at home some.  -CXR ordered.   Essential hypertension -BP elevated initially, rechecked and WNL. -Continue current medication -She is continuing to work on weight loss. -Reminded to follow low salt diet as well.   Elevated glucose -glucose elevated on recent lab work -Check A1c.

## 2018-08-11 NOTE — Assessment & Plan Note (Signed)
-  No wheezing noted on exam today.  -Has only been using lasix intermittently and reports weight is up on her scales at home some.  -CXR ordered.

## 2018-08-11 NOTE — Patient Instructions (Signed)
-Continue regular exercise -We'll be in touch with you after lab/xray returns.   Preventing Unhealthy Weight Gain, Adult Staying at a healthy weight is important. When fat builds up in your body, you may become overweight or obese. These conditions put you at greater risk for developing certain health problems, such as heart disease, diabetes, sleeping problems, joint problems, and some cancers. Unhealthy weight gain is often the result of making unhealthy choices in what you eat. It is also a result of not getting enough exercise. You can make changes to your lifestyle to prevent obesity and stay as healthy as possible. What nutrition changes can be made? To maintain a healthy weight and prevent obesity:  Eat only as much as your body needs. To do this: ? Pay attention to signs that you are hungry or full. Stop eating as soon as you feel full. ? If you feel hungry, try drinking water first. Drink enough water so your urine is clear or pale yellow. ? Eat smaller portions. ? Look at serving sizes on food labels. Most foods contain more than one serving per container. ? Eat the recommended amount of calories for your gender and activity level. While most active people should eat around 2,000 calories per day, if you are trying to lose weight or are not very active, you main need to eat less calories. Talk to your health care provider or dietitian about how many calories you should eat each day.  Choose healthy foods, such as: ? Fruits and vegetables. Try to fill at least half of your plate at each meal with fruits and vegetables. ? Whole grains, such as whole wheat bread, brown rice, and quinoa. ? Lean meats, such as chicken or fish. ? Other healthy proteins, such as beans, eggs, or tofu. ? Healthy fats, such as nuts, seeds, fatty fish, and olive oil. ? Low-fat or fat-free dairy.  Check food labels and avoid food and drinks that: ? Are high in calories. ? Have added sugar. ? Are high in  sodium. ? Have saturated fats or trans fats.  Limit how much you eat of the following foods: ? Prepackaged meals. ? Fast food. ? Fried foods. ? Processed meat, such as bacon, sausage, and deli meats. ? Fatty cuts of red meat and poultry with skin.  Cook foods in healthier ways, such as by baking, broiling, or grilling.  When grocery shopping, try to shop around the outside of the store. This helps you buy mostly fresh foods and avoid canned and prepackaged foods.  What lifestyle changes can be made?  Exercise at least 30 minutes 5 or more days each week. Exercising includes brisk walking, yard work, biking, running, swimming, and team sports like basketball and soccer. Ask your health care provider which exercises are safe for you.  Do not use any products that contain nicotine or tobacco, such as cigarettes and e-cigarettes. If you need help quitting, ask your health care provider.  Limit alcohol intake to no more than 1 drink a day for nonpregnant women and 2 drinks a day for men. One drink equals 12 oz of beer, 5 oz of wine, or 1 oz of hard liquor.  Try to get 7-9 hours of sleep each night. What other changes can be made?  Keep a food and activity journal to keep track of: ? What you ate and how many calories you had. Remember to count sauces, dressings, and side dishes. ? Whether you were active, and what exercises you did. ?  Your calorie, weight, and activity goals.  Check your weight regularly. Track any changes. If you notice you have gained weight, make changes to your diet or activity routine.  Avoid taking weight-loss medicines or supplements. Talk to your health care provider before starting any new medicine or supplement.  Talk to your health care provider before trying any new diet or exercise plan. Why are these changes important? Eating healthy, staying active, and having healthy habits not only help prevent obesity, they also:  Help you to manage stress and  emotions.  Help you to connect with friends and family.  Improve your self-esteem.  Improve your sleep.  Prevent long-term health problems.  What can happen if changes are not made? Being obese or overweight can cause you to develop joint or bone problems, which can make it hard for you to stay active or do activities you enjoy. Being obese or overweight also puts stress on your heart and lungs and can lead to health problems like diabetes, heart disease, and some cancers. Where to find more information: Talk with your health care provider or a dietitian about healthy eating and healthy lifestyle choices. You may also find other information through these resources:  U.[place]. Department of Agriculture MyPlate: FormerBoss.no  American Heart Association: www.heart.org  Centers for Disease Control and Prevention: http://www.wolf.info/  Summary  Staying at a healthy weight is important. It helps prevent certain diseases and health problems, such as heart disease, diabetes, joint problems, sleep disorders, and some cancers.  Being obese or overweight can cause you to develop joint or bone problems, which can make it hard for you to stay active or do activities you enjoy.  You can prevent unhealthy weight gain by eating a healthy diet, exercising regularly, not smoking, limiting alcohol, and getting enough sleep.  Talk with your health care provider or a dietitian for guidance about healthy eating and healthy lifestyle choices. This information is not intended to replace advice given to you by your health care provider. Make sure you discuss any questions you have with your health care provider. Document Released: 09/25/2016 Document Revised: 10/31/2016 Document Reviewed: 10/31/2016 Elsevier Interactive Patient Education  [employer].

## 2018-08-11 NOTE — Assessment & Plan Note (Signed)
-  glucose elevated on recent lab work -Check A1c.

## 2018-08-11 NOTE — Assessment & Plan Note (Signed)
-  BP elevated initially, rechecked and WNL. -Continue current medication -She is continuing to work on weight loss. -Reminded to follow low salt diet as well.

## 2018-08-12 ENCOUNTER — Telehealth: Payer: Self-pay | Admitting: Emergency Medicine

## 2018-08-12 NOTE — Progress Notes (Signed)
A1c is up a little since last check, continue to work on weight loss as discussed.  CXR is normal.

## 2018-08-12 NOTE — Telephone Encounter (Signed)
Spoke with patient regarding lab results patient understood and had no further questions regarding labs. Patient would like to know if PCP will complete a handicap form for her? Please advise.

## 2018-08-13 NOTE — Telephone Encounter (Signed)
Yes, I can complete this for her.

## 2018-08-14 ENCOUNTER — Encounter: Payer: Self-pay | Admitting: Cardiovascular Disease

## 2018-08-14 ENCOUNTER — Ambulatory Visit (INDEPENDENT_AMBULATORY_CARE_PROVIDER_SITE_OTHER): Payer: Medicare Other | Admitting: Pharmacist Clinician (PhC)/ Clinical Pharmacy Specialist

## 2018-08-14 ENCOUNTER — Ambulatory Visit (INDEPENDENT_AMBULATORY_CARE_PROVIDER_SITE_OTHER): Payer: Medicare Other | Admitting: Cardiovascular Disease

## 2018-08-14 VITALS — BP 136/78 | HR 70 | Ht 63.0 in | Wt 196.4 lb

## 2018-08-14 DIAGNOSIS — Z7901 Long term (current) use of anticoagulants: Secondary | ICD-10-CM

## 2018-08-14 DIAGNOSIS — E785 Hyperlipidemia, unspecified: Secondary | ICD-10-CM | POA: Diagnosis not present

## 2018-08-14 DIAGNOSIS — I10 Essential (primary) hypertension: Secondary | ICD-10-CM | POA: Diagnosis not present

## 2018-08-14 DIAGNOSIS — I2510 Atherosclerotic heart disease of native coronary artery without angina pectoris: Secondary | ICD-10-CM

## 2018-08-14 DIAGNOSIS — I482 Chronic atrial fibrillation, unspecified: Secondary | ICD-10-CM | POA: Diagnosis not present

## 2018-08-14 DIAGNOSIS — I4891 Unspecified atrial fibrillation: Secondary | ICD-10-CM

## 2018-08-14 LAB — POCT INR: INR: 2 (ref 2.0–3.0)

## 2018-08-14 NOTE — Patient Instructions (Signed)
Medication Instructions:  Your physician recommends that you continue on your current medications as directed. Please refer to the Current Medication list given to you today.  If you need a refill on your cardiac medications before your next appointment, please call your pharmacy.   Lab work: NONE     Testing/Procedures: NONE  Follow-Up: At [facility], you and your health needs are our priority.  As part of our continuing mission to provide you with exceptional heart care, we have created designated Provider Care Teams.  These Care Teams include your primary Cardiologist (physician) and Advanced Practice Providers (APPs -  Physician Assistants and Nurse Practitioners) who all work together to provide you with the care you need, when you need it. You will need a follow up appointment in 6 months.  Please call our office 2 months in advance to schedule this appointment.  You may see DR [place] or one of the following Advanced Practice Providers on your designated Care Team:   Luke Kilroy, PA-C Krista Kroeger, PA-C . Callie Goodrich, PA-C    

## 2018-08-14 NOTE — Progress Notes (Signed)
Cardiology Office Note   Date:  08/14/2018   ID:  Danielle Payne, DOB March 29, 1946, MRN [id number]  PCP:  Luetta Nutting, DO  Cardiologist:   Skeet Latch, MD   No chief complaint on file.   History of Present Illness: Danielle Payne is a 72 y.o. female with CAD s/p CABG (LIMA-->LAD, SVG-->OM, SVG-->PDA), chronic systolic and diastolic heart failure (systolic dysfunction resolved), hypertension, persistent atrial fibrillation, diabetes, and asthma here for follow up.  She was initially seen 05/2018 to establish care.  She was first diagnosed with atrial fibrillation in 2010 when she presented with afib with RVR.  She had an NSTEMI and had a Promus DES to the LCx.  She had a recurrent episode of symptomatic atrial fibrillation 03/2011.  She also required DCCV with Dr. Nadyne Coombes 12/2014.  She presented with recurrent NSTEMI  09/2016 and underwent 3 vessel CABG 09/25/16.  That surgery was complicated by recurrent pleural effusions that required thoracentesis.  Echo at that time revealed LVEF 35-40%.  Since that time she has felt well.  She has occasional heart fluttering that is not sustained.  When she moved back [place] she decided to re-establish with [place].  Since her last appointment Ms. Tengan had an echo 05/2018 that revealed an improvement in her LVEF 65 to 70%.  She has been feeling well.  She does water aerobics 3 days/week.  She wants to start walking to.  She has no chest pain or shortness of breath with exertion.  She is trying to work on her diet and knows that her hemoglobin A1c was elevated recently.  She tries to limit her carbs, especially as she has gluten sensitivity.  She notes that her blood pressure is sometimes low.  When he gets in the 100s she starts to feel poorly.  This tends to happen after she takes hot baths and Epson salt.  She has no lower extremity edema, orthopnea, or PND.  She is not willing to take statins because she is afraid of potential harm.   Past  Medical History:  Diagnosis Date  . Allergy   . Asthma   . Atrial fibrillation ([place])   . Chronic combined systolic and diastolic heart failure ([place]) 05/14/2018  . Coronary artery disease   . Hypertension   . Pure hypercholesterolemia 05/14/2018  . Stented coronary artery     Past Surgical History:  Procedure Laterality Date  . ABDOMINAL HYSTERECTOMY    . CARDIAC CATHETERIZATION    . CARDIOVERSION N/A 12/21/2014   Procedure: CARDIOVERSION;  Surgeon: Adrian Prows, MD;  Location: [facility];  Service: Cardiovascular;  Laterality: N/A;  . CORONARY ANGIOPLASTY    . CORONARY ARTERY BYPASS GRAFT       Current Outpatient Medications  Medication Sig Dispense Refill  . albuterol (PROVENTIL HFA;VENTOLIN HFA) 108 (90 Base) MCG/ACT inhaler Inhale 2 puffs into the lungs every 4 (four) hours as needed for wheezing or shortness of breath. 1 Inhaler 1  . carvedilol (COREG) 25 MG tablet Take 25 mg by mouth 2 (two) times daily with a meal.    . diltiazem (CARDIZEM CD) 180 MG 24 hr capsule Take 180 mg by mouth daily.    . furosemide (LASIX) 20 MG tablet Take 20 mg by mouth as needed.    . Loratadine (CLARITIN PO) Take by mouth.    Marland Kitchen OVER THE COUNTER MEDICATION Take 1 capsule by mouth at bedtime. Sleep Essentials    . warfarin (COUMADIN) 5 MG tablet Take 1/2 to  1 tablets daily as directed by coumadin clinic 90 tablet 3   No current facility-administered medications for this visit.     Allergies:   Patient has no known allergies.    Social History:  The patient  reports that she has never smoked. She has never used smokeless tobacco. She reports that she does not drink alcohol or use drugs.   Family History:  The patient's family history includes CAD in her maternal aunt and mother; Diabetes Mellitus II in her brother and sister; Hypertension in her mother; Prostate cancer in her father; Stroke in her mother.    ROS:  Please see the history of present illness.   Otherwise, review of systems are  positive for none.   All other systems are reviewed and negative.    PHYSICAL EXAM: VS:  BP 136/78   Pulse 70   Ht 5\' 3"  (1.6 m)   Wt 196 lb 6.4 oz (89.1 kg)   BMI 34.79 kg/m  , BMI Body mass index is 34.79 kg/m. GENERAL:  Well appearing HEENT: Pupils equal round and reactive, fundi not visualized, oral mucosa unremarkable NECK:  No jugular venous distention, waveform within normal limits, carotid upstroke brisk and symmetric, no bruits LUNGS:  Clear to auscultation bilaterally HEART:  RRR.  PMI not displaced or sustained,S1 and S2 within normal limits, no S3, no S4, no clicks, no rubs, no murmurs ABD:  Flat, positive bowel sounds normal in frequency in pitch, no bruits, no rebound, no guarding, no midline pulsatile mass, no hepatomegaly, no splenomegaly EXT:  2 plus pulses throughout, no edema, no cyanosis no clubbing SKIN:  No rashes no nodules NEURO:  Cranial nerves II through XII grossly intact, motor grossly intact throughout PSYCH:  Cognitively intact, oriented to person place and time   EKG:  EKG is not ordered today. The ekg ordered today demonstrates atrail fibrillation rate 75 bpm.  Prior inferior infarct  LHC 11/12/08: 90% mLCx.  X/p 3.0 28 mm Promus DES to mid Lcx.  LVEF 68^ Stress echo 09/01/2009: 7 min 16s on Bruce protocol.  LVEF 60-65%.  No ischemia. Echo 12/2009: LVEF 65-70%.  Moderate LVH.  Mild LVOT obstruction. Echo 04/03/11: LVEF 65-70%.  Moderate LVH.  RVSP 20 mmHg  Echo 05/13/18: Study Conclusions  - Left ventricle: The cavity size was normal. Wall thickness was   increased in a pattern of mild LVH. Systolic function was   vigorous. The estimated ejection fraction was in the range of 65%   to 70%. Wall motion was normal; there were no regional wall   motion abnormalities. - Mitral valve: Calcified annulus. There was trivial regurgitation. - Right ventricle: The cavity size was mildly dilated. Wall   thickness was normal. - Right atrium: The atrium was  mildly dilated.    Recent Labs: 05/15/2018: ALT 22; BUN 16; Creatinine, Ser 0.72; Hemoglobin 14.4; Magnesium 1.9; Platelets 258; Potassium 4.1; Sodium 138; TSH 1.370    Lipid Panel    Component Value Date/Time   CHOL 203 (H) 05/15/2018 0827   TRIG 206 (H) 05/15/2018 0827   HDL 37 (L) 05/15/2018 0827   CHOLHDL 5.5 (H) 05/15/2018 0827   LDLCALC 125 (H) 05/15/2018 0827      Wt Readings from Last 3 Encounters:  08/14/18 196 lb 6.4 oz (89.1 kg)  08/11/18 194 lb 9.6 oz (88.3 kg)  06/25/18 195 lb 3.2 oz (88.5 kg)      ASSESSMENT AND PLAN:  # Chronic systolic and diastolic heart failure: Ms. Daiva Nakayama  is euvolemic. She had an echo at the time of her NSTEMI at which time her LVEF was 35-40%.  It has improved to 65-70%.  Continue carvedilol  # CAD s/p NSTEMI/PCI/CABG: # Hyperlipidemia: No recent symptoms.  She continues to refuse statins.  We had a long discussion about risks and benefits and she is not interested.  Her LDL is 125.  We discussed the fact that she really needs to work on diet and exercise.  She is on interested in trying other medications and would prefer to pursue natural strategies.  # Essential hypertension: Blood pressure slightly above goal on carvedilol and diltiazem.  However, she reports lightheadedness and dizziness and some low blood pressures, so we will not titrate her regimen at this time.  # Longstanding persistent atrial fibrillation:  Rate well-controlled.  Continue diltiazem, carvedilol and warfarin.  INR was therapeutic today.  Current medicines are reviewed at length with the patient today.  The patient does not have concerns regarding medicines.  The following changes have been made:  no change  Labs/ tests ordered today include:   No orders of the defined types were placed in this encounter.    Disposition:   FU with Natilee Gauer C. Oval Linsey, MD, [facility]  In 6 months    Signed, [place] Oval Linsey, MD, [facility]  08/14/2018 6:19 PM    [place]

## 2018-08-15 NOTE — Telephone Encounter (Signed)
Form has been partially filled out and has been passed to PCP for sign off. Patient will be contacted when form is complete

## 2018-08-18 DIAGNOSIS — M47812 Spondylosis without myelopathy or radiculopathy, cervical region: Secondary | ICD-10-CM | POA: Diagnosis not present

## 2018-08-18 DIAGNOSIS — M542 Cervicalgia: Secondary | ICD-10-CM | POA: Diagnosis not present

## 2018-08-18 DIAGNOSIS — M9903 Segmental and somatic dysfunction of lumbar region: Secondary | ICD-10-CM | POA: Diagnosis not present

## 2018-08-18 DIAGNOSIS — M9901 Segmental and somatic dysfunction of cervical region: Secondary | ICD-10-CM | POA: Diagnosis not present

## 2018-08-18 NOTE — Telephone Encounter (Signed)
Patient arrived at the office and picked up form. Nothing further needed.

## 2018-08-18 NOTE — Telephone Encounter (Signed)
Left a VM for patient to give the office a call back.  

## 2018-08-25 DIAGNOSIS — M542 Cervicalgia: Secondary | ICD-10-CM | POA: Diagnosis not present

## 2018-08-25 DIAGNOSIS — M47812 Spondylosis without myelopathy or radiculopathy, cervical region: Secondary | ICD-10-CM | POA: Diagnosis not present

## 2018-08-25 DIAGNOSIS — M9903 Segmental and somatic dysfunction of lumbar region: Secondary | ICD-10-CM | POA: Diagnosis not present

## 2018-08-25 DIAGNOSIS — M9901 Segmental and somatic dysfunction of cervical region: Secondary | ICD-10-CM | POA: Diagnosis not present

## 2018-09-12 ENCOUNTER — Ambulatory Visit (INDEPENDENT_AMBULATORY_CARE_PROVIDER_SITE_OTHER): Payer: Medicare Other | Admitting: Pharmacist Clinician (PhC)/ Clinical Pharmacy Specialist

## 2018-09-12 DIAGNOSIS — Z7901 Long term (current) use of anticoagulants: Secondary | ICD-10-CM | POA: Diagnosis not present

## 2018-09-12 DIAGNOSIS — I4891 Unspecified atrial fibrillation: Secondary | ICD-10-CM

## 2018-09-12 LAB — POCT INR: INR: 2.6 (ref 2.0–3.0)

## 2018-09-12 NOTE — Patient Instructions (Signed)
Description   Continue 1 tablet daily except for 1/2 tablet each Monday, Wednesday, and Friday. Repeat INR in 4 weeks.

## 2018-09-15 DIAGNOSIS — M542 Cervicalgia: Secondary | ICD-10-CM | POA: Diagnosis not present

## 2018-09-15 DIAGNOSIS — M47812 Spondylosis without myelopathy or radiculopathy, cervical region: Secondary | ICD-10-CM | POA: Diagnosis not present

## 2018-09-15 DIAGNOSIS — M9903 Segmental and somatic dysfunction of lumbar region: Secondary | ICD-10-CM | POA: Diagnosis not present

## 2018-09-15 DIAGNOSIS — M9901 Segmental and somatic dysfunction of cervical region: Secondary | ICD-10-CM | POA: Diagnosis not present

## 2018-09-29 DIAGNOSIS — M9903 Segmental and somatic dysfunction of lumbar region: Secondary | ICD-10-CM | POA: Diagnosis not present

## 2018-09-29 DIAGNOSIS — M47812 Spondylosis without myelopathy or radiculopathy, cervical region: Secondary | ICD-10-CM | POA: Diagnosis not present

## 2018-09-29 DIAGNOSIS — M9901 Segmental and somatic dysfunction of cervical region: Secondary | ICD-10-CM | POA: Diagnosis not present

## 2018-09-29 DIAGNOSIS — M542 Cervicalgia: Secondary | ICD-10-CM | POA: Diagnosis not present

## 2018-10-13 ENCOUNTER — Ambulatory Visit (INDEPENDENT_AMBULATORY_CARE_PROVIDER_SITE_OTHER): Payer: Medicare Other | Admitting: Pharmacist

## 2018-10-13 DIAGNOSIS — Z7901 Long term (current) use of anticoagulants: Secondary | ICD-10-CM | POA: Diagnosis not present

## 2018-10-13 DIAGNOSIS — I4891 Unspecified atrial fibrillation: Secondary | ICD-10-CM

## 2018-10-13 LAB — POCT INR: INR: 1.6 — AB (ref 2.0–3.0)

## 2018-10-13 MED ORDER — CARVEDILOL 25 MG PO TABS: 25.0000 mg | ORAL_TABLET | Freq: Two times a day (BID) | ORAL | 1 refills | Status: DC

## 2018-10-13 MED ORDER — DILTIAZEM HCL ER COATED BEADS 180 MG PO CP24: 180.0000 mg | ORAL_CAPSULE | Freq: Every day | ORAL | 1 refills | Status: DC

## 2018-10-14 DIAGNOSIS — M542 Cervicalgia: Secondary | ICD-10-CM | POA: Diagnosis not present

## 2018-10-14 DIAGNOSIS — M47812 Spondylosis without myelopathy or radiculopathy, cervical region: Secondary | ICD-10-CM | POA: Diagnosis not present

## 2018-10-14 DIAGNOSIS — M9901 Segmental and somatic dysfunction of cervical region: Secondary | ICD-10-CM | POA: Diagnosis not present

## 2018-10-14 DIAGNOSIS — M9903 Segmental and somatic dysfunction of lumbar region: Secondary | ICD-10-CM | POA: Diagnosis not present

## 2018-10-29 ENCOUNTER — Ambulatory Visit (INDEPENDENT_AMBULATORY_CARE_PROVIDER_SITE_OTHER): Payer: Medicare Other | Admitting: Pharmacist

## 2018-10-29 DIAGNOSIS — Z7901 Long term (current) use of anticoagulants: Secondary | ICD-10-CM | POA: Diagnosis not present

## 2018-10-29 DIAGNOSIS — I4891 Unspecified atrial fibrillation: Secondary | ICD-10-CM

## 2018-10-29 LAB — POCT INR: INR: 2.3 (ref 2.0–3.0)

## 2018-10-30 DIAGNOSIS — M47812 Spondylosis without myelopathy or radiculopathy, cervical region: Secondary | ICD-10-CM | POA: Diagnosis not present

## 2018-10-30 DIAGNOSIS — M9901 Segmental and somatic dysfunction of cervical region: Secondary | ICD-10-CM | POA: Diagnosis not present

## 2018-10-30 DIAGNOSIS — M9902 Segmental and somatic dysfunction of thoracic region: Secondary | ICD-10-CM | POA: Diagnosis not present

## 2018-10-30 DIAGNOSIS — M542 Cervicalgia: Secondary | ICD-10-CM | POA: Diagnosis not present

## 2018-11-17 ENCOUNTER — Other Ambulatory Visit: Payer: Self-pay | Admitting: Cardiovascular Disease

## 2018-11-17 MED ORDER — CARVEDILOL 25 MG PO TABS: 25.0000 mg | ORAL_TABLET | Freq: Two times a day (BID) | ORAL | 2 refills | Status: DC

## 2018-11-17 NOTE — Telephone Encounter (Signed)
Rx(s) sent to pharmacy electronically.  

## 2018-11-17 NOTE — Telephone Encounter (Signed)
New Message    *STAT* If patient is at the pharmacy, call can be transferred to refill team.   1. Which medications need to be refilled? (please list name of each medication and dose if known)  Carvedilol 25mg    2. Which pharmacy/location (including street and city if local pharmacy) is medication to be sent to? [facility]  3. Do they need a 30 day or 90 day supply?  90 Day Supply

## 2018-11-20 ENCOUNTER — Other Ambulatory Visit: Payer: Self-pay

## 2018-11-20 ENCOUNTER — Observation Stay (HOSPITAL_COMMUNITY)
Admission: EM | Admit: 2018-11-20 | Discharge: 2018-11-21 | Disposition: A | Discharge status: Home / Self Care | Payer: Medicare Other | Attending: Internal Medicine | Admitting: Internal Medicine

## 2018-11-20 ENCOUNTER — Emergency Department (HOSPITAL_COMMUNITY): Payer: Medicare Other

## 2018-11-20 ENCOUNTER — Encounter (HOSPITAL_COMMUNITY): Payer: Self-pay | Admitting: Emergency Medicine

## 2018-11-20 DIAGNOSIS — R6510 Systemic inflammatory response syndrome (SIRS) of non-infectious origin without acute organ dysfunction: Secondary | ICD-10-CM | POA: Diagnosis not present

## 2018-11-20 DIAGNOSIS — J09X2 Influenza due to identified novel influenza A virus with other respiratory manifestations: Secondary | ICD-10-CM | POA: Insufficient documentation

## 2018-11-20 DIAGNOSIS — Z8249 Family history of ischemic heart disease and other diseases of the circulatory system: Secondary | ICD-10-CM | POA: Diagnosis not present

## 2018-11-20 DIAGNOSIS — Z955 Presence of coronary angioplasty implant and graft: Secondary | ICD-10-CM | POA: Diagnosis not present

## 2018-11-20 DIAGNOSIS — J101 Influenza due to other identified influenza virus with other respiratory manifestations: Secondary | ICD-10-CM

## 2018-11-20 DIAGNOSIS — E876 Hypokalemia: Secondary | ICD-10-CM | POA: Insufficient documentation

## 2018-11-20 DIAGNOSIS — I2510 Atherosclerotic heart disease of native coronary artery without angina pectoris: Secondary | ICD-10-CM

## 2018-11-20 DIAGNOSIS — I4891 Unspecified atrial fibrillation: Secondary | ICD-10-CM | POA: Diagnosis not present

## 2018-11-20 DIAGNOSIS — I110 Hypertensive heart disease with heart failure: Secondary | ICD-10-CM | POA: Diagnosis not present

## 2018-11-20 DIAGNOSIS — Z7901 Long term (current) use of anticoagulants: Secondary | ICD-10-CM | POA: Insufficient documentation

## 2018-11-20 DIAGNOSIS — I5042 Chronic combined systolic (congestive) and diastolic (congestive) heart failure: Secondary | ICD-10-CM | POA: Diagnosis not present

## 2018-11-20 DIAGNOSIS — E7800 Pure hypercholesterolemia, unspecified: Secondary | ICD-10-CM | POA: Insufficient documentation

## 2018-11-20 DIAGNOSIS — J45901 Unspecified asthma with (acute) exacerbation: Secondary | ICD-10-CM

## 2018-11-20 DIAGNOSIS — R0602 Shortness of breath: Secondary | ICD-10-CM | POA: Diagnosis not present

## 2018-11-20 DIAGNOSIS — R05 Cough: Secondary | ICD-10-CM | POA: Diagnosis not present

## 2018-11-20 DIAGNOSIS — I10 Essential (primary) hypertension: Secondary | ICD-10-CM | POA: Diagnosis not present

## 2018-11-20 DIAGNOSIS — Z79899 Other long term (current) drug therapy: Secondary | ICD-10-CM | POA: Insufficient documentation

## 2018-11-20 DIAGNOSIS — Z951 Presence of aortocoronary bypass graft: Secondary | ICD-10-CM | POA: Diagnosis not present

## 2018-11-20 LAB — CBC WITH DIFFERENTIAL/PLATELET
Abs Immature Granulocytes: 0.06 10*3/uL (ref 0.00–0.07)
BASOS PCT: 0 %
Basophils Absolute: 0.1 10*3/uL (ref 0.0–0.1)
Eosinophils Absolute: 0.1 10*3/uL (ref 0.0–0.5)
Eosinophils Relative: 1 %
HCT: 43.3 % (ref 36.0–46.0)
Hemoglobin: 14.2 g/dL (ref 12.0–15.0)
Immature Granulocytes: 1 %
Lymphocytes Relative: 11 %
Lymphs Abs: 1.3 10*3/uL (ref 0.7–4.0)
MCH: 29.5 pg (ref 26.0–34.0)
MCHC: 32.8 g/dL (ref 30.0–36.0)
MCV: 90 fL (ref 80.0–100.0)
Monocytes Absolute: 0.7 10*3/uL (ref 0.1–1.0)
Monocytes Relative: 6 %
Neutro Abs: 9 10*3/uL — ABNORMAL HIGH (ref 1.7–7.7)
Neutrophils Relative %: 81 %
PLATELETS: 223 10*3/uL (ref 150–400)
RBC: 4.81 MIL/uL (ref 3.87–5.11)
RDW: 13.5 % (ref 11.5–15.5)
WBC: 11.2 10*3/uL — ABNORMAL HIGH (ref 4.0–10.5)
nRBC: 0 % (ref 0.0–0.2)

## 2018-11-20 LAB — COMPREHENSIVE METABOLIC PANEL
ALT: 20 U/L (ref 0–44)
AST: 19 U/L (ref 15–41)
Albumin: 4.3 g/dL (ref 3.5–5.0)
Alkaline Phosphatase: 79 U/L (ref 38–126)
Anion gap: 9 (ref 5–15)
BUN: 15 mg/dL (ref 8–23)
CO2: 24 mmol/L (ref 22–32)
Calcium: 8.8 mg/dL — ABNORMAL LOW (ref 8.9–10.3)
Chloride: 106 mmol/L (ref 98–111)
Creatinine, Ser: 0.68 mg/dL (ref 0.44–1.00)
GFR calc Af Amer: >60 mL/min (ref >60)
GFR calc non Af Amer: >60 mL/min (ref >60)
Glucose, Bld: 170 mg/dL — ABNORMAL HIGH (ref 70–99)
Potassium: 3.2 mmol/L — ABNORMAL LOW (ref 3.5–5.1)
Sodium: 139 mmol/L (ref 135–145)
Total Bilirubin: 1 mg/dL (ref 0.3–1.2)
Total Protein: 7.7 g/dL (ref 6.5–8.1)

## 2018-11-20 LAB — URINALYSIS, ROUTINE W REFLEX MICROSCOPIC
Bacteria, UA: NONE SEEN
Bilirubin Urine: NEGATIVE
GLUCOSE, UA: NEGATIVE mg/dL
Ketones, ur: NEGATIVE mg/dL
Leukocytes,Ua: NEGATIVE
Nitrite: NEGATIVE
Protein, ur: NEGATIVE mg/dL
Specific Gravity, Urine: 1.006 (ref 1.005–1.030)
pH: 7 (ref 5.0–8.0)

## 2018-11-20 LAB — MRSA PCR SCREENING: MRSA by PCR: NEGATIVE

## 2018-11-20 LAB — PROTIME-INR
INR: 1.83
Prothrombin Time: 20.9 seconds — ABNORMAL HIGH (ref 11.4–15.2)

## 2018-11-20 LAB — MAGNESIUM: Magnesium: 1.9 mg/dL (ref 1.7–2.4)

## 2018-11-20 LAB — INFLUENZA PANEL BY PCR (TYPE A & B)
Influenza A By PCR: POSITIVE — AB
Influenza B By PCR: NEGATIVE

## 2018-11-20 LAB — LACTIC ACID, PLASMA: LACTIC ACID, VENOUS: 1.4 mmol/L (ref 0.5–1.9)

## 2018-11-20 MED ORDER — OSELTAMIVIR PHOSPHATE 75 MG PO CAPS
75.0000 mg | ORAL_CAPSULE | Freq: Two times a day (BID) | ORAL | Status: DC
  Administered 2018-11-20 – 2018-11-21 (×2): 75 mg via ORAL
  Filled 2018-11-20 (×2): qty 1

## 2018-11-20 MED ORDER — WARFARIN - PHYSICIAN DOSING INPATIENT: Freq: Every day | Status: DC

## 2018-11-20 MED ORDER — DILTIAZEM HCL 100 MG IV SOLR
5.0000 mg/h | INTRAVENOUS | Status: DC
  Administered 2018-11-20: 5 mg/h via INTRAVENOUS
  Filled 2018-11-20: qty 100

## 2018-11-20 MED ORDER — ACETAMINOPHEN 325 MG PO TABS: 650.0000 mg | ORAL_TABLET | Freq: Four times a day (QID) | ORAL | Status: DC | PRN

## 2018-11-20 MED ORDER — DILTIAZEM LOAD VIA INFUSION
20.0000 mg | Freq: Once | INTRAVENOUS | Status: AC
  Administered 2018-11-20: 20 mg via INTRAVENOUS
  Filled 2018-11-20: qty 20

## 2018-11-20 MED ORDER — POTASSIUM CHLORIDE CRYS ER 20 MEQ PO TBCR
40.0000 meq | EXTENDED_RELEASE_TABLET | Freq: Once | ORAL | Status: AC
  Administered 2018-11-20: 40 meq via ORAL
  Filled 2018-11-20: qty 2

## 2018-11-20 MED ORDER — WARFARIN SODIUM 5 MG PO TABS
5.0000 mg | ORAL_TABLET | Freq: Once | ORAL | Status: AC
  Administered 2018-11-20: 5 mg via ORAL
  Filled 2018-11-20: qty 1

## 2018-11-20 MED ORDER — LACTATED RINGERS IV SOLN
INTRAVENOUS | Status: AC
  Administered 2018-11-20: 13:00:00 via INTRAVENOUS

## 2018-11-20 MED ORDER — DILTIAZEM HCL ER 90 MG PO CP12
90.0000 mg | ORAL_CAPSULE | ORAL | Status: AC
  Administered 2018-11-20: 90 mg via ORAL
  Filled 2018-11-20: qty 1

## 2018-11-20 MED ORDER — PREDNISONE 20 MG PO TABS
40.0000 mg | ORAL_TABLET | Freq: Every day | ORAL | Status: DC
  Administered 2018-11-20 – 2018-11-21 (×2): 40 mg via ORAL
  Filled 2018-11-20 (×2): qty 2

## 2018-11-20 MED ORDER — ONDANSETRON HCL 4 MG/2ML IJ SOLN
4.0000 mg | Freq: Once | INTRAMUSCULAR | Status: AC
  Administered 2018-11-20: 4 mg via INTRAVENOUS
  Filled 2018-11-20: qty 2

## 2018-11-20 MED ORDER — ALBUTEROL SULFATE (2.5 MG/3ML) 0.083% IN NEBU
5.0000 mg | INHALATION_SOLUTION | Freq: Once | RESPIRATORY_TRACT | Status: AC
  Administered 2018-11-20: 5 mg via RESPIRATORY_TRACT
  Filled 2018-11-20: qty 6

## 2018-11-20 MED ORDER — DILTIAZEM HCL ER 90 MG PO CP12
90.0000 mg | ORAL_CAPSULE | Freq: Two times a day (BID) | ORAL | Status: DC
  Filled 2018-11-20: qty 1

## 2018-11-20 MED ORDER — CARVEDILOL 25 MG PO TABS
25.0000 mg | ORAL_TABLET | Freq: Two times a day (BID) | ORAL | Status: DC
  Filled 2018-11-20: qty 2

## 2018-11-20 MED ORDER — CARVEDILOL 25 MG PO TABS
25.0000 mg | ORAL_TABLET | Freq: Two times a day (BID) | ORAL | Status: DC
  Administered 2018-11-20 – 2018-11-21 (×2): 25 mg via ORAL
  Filled 2018-11-20 (×2): qty 2
  Filled 2018-11-20 (×2): qty 1

## 2018-11-20 MED ORDER — SODIUM CHLORIDE 0.9% FLUSH
3.0000 mL | Freq: Once | INTRAVENOUS | Status: AC
  Administered 2018-11-20: 3 mL via INTRAVENOUS

## 2018-11-20 MED ORDER — WARFARIN SODIUM 2.5 MG PO TABS
2.5000 mg | ORAL_TABLET | Freq: Once | ORAL | Status: AC
  Administered 2018-11-20: 2.5 mg via ORAL
  Filled 2018-11-20: qty 1

## 2018-11-20 MED ORDER — IPRATROPIUM-ALBUTEROL 0.5-2.5 (3) MG/3ML IN SOLN: 3.0000 mL | Freq: Four times a day (QID) | RESPIRATORY_TRACT | Status: DC

## 2018-11-20 MED ORDER — LACTATED RINGERS IV BOLUS: 1000.0000 mL | Freq: Once | INTRAVENOUS | Status: DC

## 2018-11-20 MED ORDER — ACETAMINOPHEN 650 MG RE SUPP: 650.0000 mg | Freq: Four times a day (QID) | RECTAL | Status: DC | PRN

## 2018-11-20 MED ORDER — OSELTAMIVIR PHOSPHATE 75 MG PO CAPS
75.0000 mg | ORAL_CAPSULE | Freq: Once | ORAL | Status: AC
  Administered 2018-11-20: 75 mg via ORAL
  Filled 2018-11-20: qty 1

## 2018-11-20 MED ORDER — LEVALBUTEROL HCL 0.63 MG/3ML IN NEBU: 0.6300 mg | INHALATION_SOLUTION | Freq: Four times a day (QID) | RESPIRATORY_TRACT | Status: DC | PRN

## 2018-11-20 MED ORDER — SODIUM CHLORIDE 0.9 % IV BOLUS
1000.0000 mL | Freq: Once | INTRAVENOUS | Status: AC
  Administered 2018-11-20: 1000 mL via INTRAVENOUS

## 2018-11-20 MED ORDER — WARFARIN - PHARMACIST DOSING INPATIENT: Freq: Every day | Status: DC

## 2018-11-20 MED ORDER — DILTIAZEM HCL 60 MG PO TABS
60.0000 mg | ORAL_TABLET | Freq: Four times a day (QID) | ORAL | Status: DC
  Administered 2018-11-20 – 2018-11-21 (×3): 60 mg via ORAL
  Filled 2018-11-20 (×3): qty 1

## 2018-11-20 NOTE — ED Provider Notes (Signed)
[facility] DEPT [facility] Provider Note: Georgena Spurling, MD, FACEP  CSN: [id number] MRN: [id number] ARRIVAL: 11/20/18 at [place]: 1241/1241-01   CHIEF COMPLAINT  Shortness of Breath   HISTORY OF PRESENT ILLNESS  11/20/18 5:36 AM Danielle Payne is a 73 y.o. female with a history of asthma and atrial fibrillation.  She is here with shortness of breath that began yesterday and worsened overnight.  She is been using her home albuterol inhaler without adequate relief she estimates she is used 3 times.  She has had a fever with this along with coughing, often paroxysmal.  She was noted on arrival to be tachycardic.  He she has had nausea but no vomiting or diarrhea.  She took Coricidin this morning at 3 AM.  She did not take yesterday evening's dose of warfarin or Cardizem.  The septic protocol was initiated on arrival.    Past Medical History:  Diagnosis Date  . Allergy   . Asthma   . Atrial fibrillation ([place])   . Chronic combined systolic and diastolic heart failure ([place]) 05/14/2018  . Coronary artery disease   . Hypertension   . Pure hypercholesterolemia 05/14/2018  . Stented coronary artery     Past Surgical History:  Procedure Laterality Date  . ABDOMINAL HYSTERECTOMY    . CARDIAC CATHETERIZATION    . CARDIOVERSION N/A 12/21/2014   Procedure: CARDIOVERSION;  Surgeon: Adrian Prows, MD;  Location: [facility];  Service: Cardiovascular;  Laterality: N/A;  . CORONARY ANGIOPLASTY    . CORONARY ARTERY BYPASS GRAFT      Family History  Problem Relation Age of Onset  . CAD Mother   . Stroke Mother   . Hypertension Mother   . Prostate cancer Father   . Diabetes Mellitus II Sister   . Diabetes Mellitus II Brother   . CAD Maternal Aunt     Social History   Tobacco Use  . Smoking status: Never Smoker  . Smokeless tobacco: Never Used  Substance Use Topics  . Alcohol use: No  . Drug use: No    Prior to Admission medications   Medication Sig Start Date End Date Taking?  Authorizing Provider  albuterol (PROVENTIL HFA;VENTOLIN HFA) 108 (90 Base) MCG/ACT inhaler Inhale 2 puffs into the lungs every 4 (four) hours as needed for wheezing or shortness of breath. 06/11/18  Yes Luetta Nutting, DO  carvedilol (COREG) 25 MG tablet Take 1 tablet (25 mg total) by mouth 2 (two) times daily with a meal. 11/17/18  Yes Skeet Latch, MD  diltiazem (CARDIZEM CD) 180 MG 24 hr capsule Take 1 capsule (180 mg total) by mouth daily. 10/13/18  Yes Skeet Latch, MD  Loratadine (CLARITIN PO) Take 10 mg by mouth daily.    Yes [provider]  OVER THE COUNTER MEDICATION Take 1 capsule by mouth at bedtime. Sleep Essentials   Yes [provider]  warfarin (COUMADIN) 5 MG tablet Take 1/2 to 1 tablets daily as directed by coumadin clinic Patient taking differently: Take 2.5-5 mg by mouth as directed. Take 2.5MG  (1/2 tablet)  Monday, wednesday, Friday and 5 MG (1 tablet) daily on Sunday, Tuesday, Thursday and Sunday as directed by coumadin clinic 06/03/18  Yes Skeet Latch, MD    Allergies Patient has no known allergies.   REVIEW OF SYSTEMS  Negative except as noted here or in the History of Present Illness.   PHYSICAL EXAMINATION  Initial Vital Signs Blood pressure (!) 173/133, pulse (!) 150, temperature 100.3 F (37.9 C), temperature source  Oral, resp. rate 16, height 5\' 3"  (1.6 m), weight 86.2 kg, SpO2 97 %.  Examination General: Well-developed, well-nourished female in no acute distress; appearance consistent with age of record HENT: normocephalic; atraumatic Eyes: pupils equal, round and reactive to light; extraocular muscles intact Neck: supple Heart: Irregular rhythm; tachycardia Lungs: Expiratory wheezes; basilar rales Abdomen: soft; nondistended; nontender; bowel sounds present Extremities: No deformity; full range of motion; pulses normal Neurologic: Awake, alert and oriented; motor function intact in all extremities and symmetric; no facial  droop Skin: Warm and dry Psychiatric: Normal mood and affect   RESULTS  Summary of this visit's results, reviewed by myself:   EKG Interpretation  Date/Time:  Thursday November 20 2018 05:52:05 EST Ventricular Rate:  157 PR Interval:    QRS Duration: 89 QT Interval:  260 QTC Calculation: 421 R Axis:   23 Text Interpretation:  Atrial fibrillation with RVR Repolarization abnormality, prob rate related Confirmed by Shanon Rosser [phone]) on 11/20/2018 6:13:20 AM Also confirmed by Shanon Rosser [phone]), editor Philomena Doheny [phone])  on 11/20/2018 8:35:11 AM      Laboratory Studies: Results for orders placed or performed during the hospital encounter of 11/20/18 (from the past 24 hour(s))  Comprehensive metabolic panel     Status: Abnormal   Collection Time: 11/20/18  6:00 AM  Result Value Ref Range   Sodium 139 135 - 145 mmol/L   Potassium 3.2 (L) 3.5 - 5.1 mmol/L   Chloride 106 98 - 111 mmol/L   CO2 24 22 - 32 mmol/L   Glucose, Bld 170 (H) 70 - 99 mg/dL   BUN 15 8 - 23 mg/dL   Creatinine, Ser 0.68 0.44 - 1.00 mg/dL   Calcium 8.8 (L) 8.9 - 10.3 mg/dL   Total Protein 7.7 6.5 - 8.1 g/dL   Albumin 4.3 3.5 - 5.0 g/dL   AST 19 15 - 41 U/L   ALT 20 0 - 44 U/L   Alkaline Phosphatase 79 38 - 126 U/L   Total Bilirubin 1.0 0.3 - 1.2 mg/dL   GFR calc non Af Amer >60 >60 mL/min   GFR calc Af Amer >60 >60 mL/min   Anion gap 9 5 - 15  Lactic acid, plasma     Status: None   Collection Time: 11/20/18  6:00 AM  Result Value Ref Range   Lactic Acid, Venous 1.4 0.5 - 1.9 mmol/L  CBC with Differential     Status: Abnormal   Collection Time: 11/20/18  6:00 AM  Result Value Ref Range   WBC 11.2 (H) 4.0 - 10.5 K/uL   RBC 4.81 3.87 - 5.11 MIL/uL   Hemoglobin 14.2 12.0 - 15.0 g/dL   HCT 43.3 36.0 - 46.0 %   MCV 90.0 80.0 - 100.0 fL   MCH 29.5 26.0 - 34.0 pg   MCHC 32.8 30.0 - 36.0 g/dL   RDW 13.5 11.5 - 15.5 %   Platelets 223 150 - 400 K/uL   nRBC 0.0 0.0 - 0.2 %   Neutrophils Relative % 81  %   Neutro Abs 9.0 (H) 1.7 - 7.7 K/uL   Lymphocytes Relative 11 %   Lymphs Abs 1.3 0.7 - 4.0 K/uL   Monocytes Relative 6 %   Monocytes Absolute 0.7 0.1 - 1.0 K/uL   Eosinophils Relative 1 %   Eosinophils Absolute 0.1 0.0 - 0.5 K/uL   Basophils Relative 0 %   Basophils Absolute 0.1 0.0 - 0.1 K/uL   Immature Granulocytes 1 %  Abs Immature Granulocytes 0.06 0.00 - 0.07 K/uL  Protime-INR     Status: Abnormal   Collection Time: 11/20/18  6:00 AM  Result Value Ref Range   Prothrombin Time 20.9 (H) 11.4 - 15.2 seconds   INR 1.83   Urinalysis, Routine w reflex microscopic     Status: Abnormal   Collection Time: 11/20/18  6:00 AM  Result Value Ref Range   Color, Urine YELLOW YELLOW   APPearance CLEAR CLEAR   Specific Gravity, Urine 1.006 1.005 - 1.030   pH 7.0 5.0 - 8.0   Glucose, UA NEGATIVE NEGATIVE mg/dL   Hgb urine dipstick SMALL (A) NEGATIVE   Bilirubin Urine NEGATIVE NEGATIVE   Ketones, ur NEGATIVE NEGATIVE mg/dL   Protein, ur NEGATIVE NEGATIVE mg/dL   Nitrite NEGATIVE NEGATIVE   Leukocytes,Ua NEGATIVE NEGATIVE   RBC / HPF 0-5 0 - 5 RBC/hpf   WBC, UA 0-5 0 - 5 WBC/hpf   Bacteria, UA NONE SEEN NONE SEEN   Squamous Epithelial / LPF 0-5 0 - 5   Mucus PRESENT   Magnesium     Status: None   Collection Time: 11/20/18  6:00 AM  Result Value Ref Range   Magnesium 1.9 1.7 - 2.4 mg/dL  Influenza panel by PCR (type A & B)     Status: Abnormal   Collection Time: 11/20/18  6:05 AM  Result Value Ref Range   Influenza A By PCR POSITIVE (A) NEGATIVE   Influenza B By PCR NEGATIVE NEGATIVE  MRSA PCR Screening     Status: None   Collection Time: 11/20/18 12:10 PM  Result Value Ref Range   MRSA by PCR NEGATIVE NEGATIVE   Imaging Studies: Dg Chest 2 View  Result Date: 11/20/2018 CLINICAL DATA:  Cough and shortness of breath with wheezing EXAM: CHEST - 2 VIEW COMPARISON:  08/11/2018 FINDINGS: Chronic cardiomegaly. Prior median sternotomy and left atrial clipping. Limited detail due to  soft tissue attenuation. There is no edema, consolidation, effusion, or pneumothorax. Artifact from EKG leads. IMPRESSION: No evidence of acute disease. Electronically Signed   By: Monte Fantasia M.D.   On: 11/20/2018 06:32    ED COURSE and MDM  Nursing notes and initial vitals signs, including pulse oximetry, reviewed.  Vitals:   11/20/18 1201 11/20/18 1600 11/20/18 1729 11/20/18 2000  BP: (!) 175/80  (!) 158/81   Pulse: (!) 102     Resp: (!) 22     Temp: (!) 100.4 F (38 C) 99.4 F (37.4 C)  99.4 F (37.4 C)  TempSrc: Oral Oral  Oral  SpO2: 96%     Weight:      Height:       6:13 AM Cardizem bolus and drip started for A. fib with RVR.   7:02 AM Patient's rate well controlled on Cardizem drip.  Will attempt transition to oral Cardizem.  Patient given dose of warfarin due to subtherapeutic INR.  Patient also given K. Dur due to low potassium.  Wheezing improved after neb treatment.   PROCEDURES   CRITICAL CARE Performed by: Karen Chafe Socorro Ebron Total critical care time: 35 minutes Critical care time was exclusive of separately billable procedures and treating other patients. Critical care was necessary to treat or prevent imminent or life-threatening deterioration. Critical care was time spent personally by me on the following activities: development of treatment plan with patient and/or surrogate as well as nursing, discussions with consultants, evaluation of patient's response to treatment, examination of patient, obtaining history from patient or  surrogate, ordering and performing treatments and interventions, ordering and review of laboratory studies, ordering and review of radiographic studies, pulse oximetry and re-evaluation of patient's condition.   ED DIAGNOSES     ICD-10-CM   1. Atrial fibrillation with RVR (HCC) I48.91   2. Influenza A J10.1        Hjalmar Ballengee, MD 11/20/18 2238

## 2018-11-20 NOTE — ED Notes (Signed)
Patient and husband updated on POC. Patient provided with breakfast. Patient still in Afib rhythm, but patient reports her dyspnea is better.

## 2018-11-20 NOTE — ED Triage Notes (Signed)
Patient is complaining of sob, congestion, and fever. Patient has a hx of asthma. Patient has been using inhaler but she states it feels like it is not working.

## 2018-11-20 NOTE — H&P (Signed)
History and Physical    Danielle Payne ZOX:[id number] DOB: Sep 15, 1946 DOA: 11/20/2018  PCP: Luetta Nutting, DO  Patient coming from: home  I have personally briefly reviewed patient's old medical records in [place]  Chief Complaint: wheezing, shortness of breath, flu like symptoms  HPI: Danielle Payne is a 73 y.o. female with medical history significant of coronary disease status post CABG, hypertension, atrial fibrillation on warfarin, history of combined systolic and diastolic heart failure who presented after developing wheezing last night and shortness of breath.  She is found to be flu a positive has been admitted for A. fib with RVR.    Patient notes her symptoms started last night.  She describes this as wheezing which she noted was severe.  She notes shortness of breath due to this.  She denies a history of COPD, but does have asthma.  She does not smoke.  She states that she also felt like she had the flu.  She felt weak and had body aches.  She notes fever.  She denies chest pain.  She endorses shortness of breath noted above.  She denies any abdominal pain.  She notes nausea, but no vomiting.  She denies any urinary or bowel abnormalities.  She denies any palpitations.    ED Course: Labs, EKG, chest x-ray.  Noted to have influenza as well as A. fib with RVR.  Started on diltiazem drip, but unable to wean off.  Admit for persistent RVR in the setting of influenza and asthma exacerbation.  Review of Systems: As per HPI otherwise 10 point review of systems negative.   Past Medical History:  Diagnosis Date  . Allergy   . Asthma   . Atrial fibrillation ([place])   . Chronic combined systolic and diastolic heart failure ([place]) 05/14/2018  . Coronary artery disease   . Hypertension   . Pure hypercholesterolemia 05/14/2018  . Stented coronary artery     Past Surgical History:  Procedure Laterality Date  . ABDOMINAL HYSTERECTOMY    . CARDIAC CATHETERIZATION    . CARDIOVERSION  N/A 12/21/2014   Procedure: CARDIOVERSION;  Surgeon: Adrian Prows, MD;  Location: [facility];  Service: Cardiovascular;  Laterality: N/A;  . CORONARY ANGIOPLASTY    . CORONARY ARTERY BYPASS GRAFT       reports that she has never smoked. She has never used smokeless tobacco. She reports that she does not drink alcohol or use drugs.  No Known Allergies  Family History  Problem Relation Age of Onset  . CAD Mother   . Stroke Mother   . Hypertension Mother   . Prostate cancer Father   . Diabetes Mellitus II Sister   . Diabetes Mellitus II Brother   . CAD Maternal Aunt    Prior to Admission medications   Medication Sig Start Date End Date Taking? Authorizing Provider  albuterol (PROVENTIL HFA;VENTOLIN HFA) 108 (90 Base) MCG/ACT inhaler Inhale 2 puffs into the lungs every 4 (four) hours as needed for wheezing or shortness of breath. 06/11/18  Yes Luetta Nutting, DO  carvedilol (COREG) 25 MG tablet Take 1 tablet (25 mg total) by mouth 2 (two) times daily with a meal. 11/17/18  Yes Skeet Latch, MD  diltiazem (CARDIZEM CD) 180 MG 24 hr capsule Take 1 capsule (180 mg total) by mouth daily. 10/13/18  Yes Skeet Latch, MD  Loratadine (CLARITIN PO) Take 10 mg by mouth daily.    Yes [provider]  OVER THE COUNTER MEDICATION Take 1 capsule by mouth at  bedtime. Sleep Essentials   Yes [provider]  warfarin (COUMADIN) 5 MG tablet Take 1/2 to 1 tablets daily as directed by coumadin clinic Patient taking differently: Take 2.5-5 mg by mouth as directed. Take 2.5MG  (1/2 tablet)  Monday, wednesday, Friday and 5 MG (1 tablet) daily on Sunday, Tuesday, Thursday and Sunday as directed by coumadin clinic 06/03/18  Yes Skeet Latch, MD    Physical Exam: Vitals:   11/20/18 1015 11/20/18 1045 11/20/18 1100 11/20/18 1115  BP: (!) 140/98 (!) 159/98 (!) 167/71 (!) 160/84  Pulse: (!) 134 (!) 124 (!) 117   Resp: 20 (!) 23 (!) 21 17  Temp:      TempSrc:      SpO2: 96% 95% 95%     Weight:      Height:        Constitutional: NAD, calm, comfortable Vitals:   11/20/18 1015 11/20/18 1045 11/20/18 1100 11/20/18 1115  BP: (!) 140/98 (!) 159/98 (!) 167/71 (!) 160/84  Pulse: (!) 134 (!) 124 (!) 117   Resp: 20 (!) 23 (!) 21 17  Temp:      TempSrc:      SpO2: 96% 95% 95%   Weight:      Height:       Eyes: PERRL, lids and conjunctivae normal ENMT: Mucous membranes are moist. Posterior pharynx clear of any exudate or lesions.Normal dentition.  Neck: normal, supple, no masses, no thyromegaly Respiratory: faint expiratory wheezing bilaterally, worse at bases Cardiovascular: tachycardic rate and irregular rhythm.  No LEE.  Abdomen: no tenderness, no masses palpated. No hepatosplenomegaly. Bowel sounds positive.  Musculoskeletal: no clubbing / cyanosis. No joint deformity upper and lower extremities. Good ROM, no contractures. Normal muscle tone.  Skin: no rashes, lesions, ulcers. No induration Neurologic: CN 2-12 grossly intact. Sensation intact. Moving all extremities equally. Psychiatric: Normal judgment and insight. Alert and oriented x 3. Normal mood.   Labs on Admission: I have personally reviewed following labs and imaging studies  CBC: Recent Labs  Lab 11/20/18 0600  WBC 11.2*  NEUTROABS 9.0*  HGB 14.2  HCT 43.3  MCV 90.0  PLT 834   Basic Metabolic Panel: Recent Labs  Lab 11/20/18 0600  NA 139  K 3.2*  CL 106  CO2 24  GLUCOSE 170*  BUN 15  CREATININE 0.68  CALCIUM 8.8*  MG 1.9   GFR: Estimated Creatinine Clearance: 64.2 mL/min (by C-G formula based on SCr of 0.68 mg/dL). Liver Function Tests: Recent Labs  Lab 11/20/18 0600  AST 19  ALT 20  ALKPHOS 79  BILITOT 1.0  PROT 7.7  ALBUMIN 4.3   No results for input(s): LIPASE, AMYLASE in the last 168 hours. No results for input(s): AMMONIA in the last 168 hours. Coagulation Profile: Recent Labs  Lab 11/20/18 0600  INR 1.83   Cardiac Enzymes: No results for input(s): CKTOTAL, CKMB,  CKMBINDEX, TROPONINI in the last 168 hours. BNP (last 3 results) No results for input(s): PROBNP in the last 8760 hours. HbA1C: No results for input(s): HGBA1C in the last 72 hours. CBG: No results for input(s): GLUCAP in the last 168 hours. Lipid Profile: No results for input(s): CHOL, HDL, LDLCALC, TRIG, CHOLHDL, LDLDIRECT in the last 72 hours. Thyroid Function Tests: No results for input(s): TSH, T4TOTAL, FREET4, T3FREE, THYROIDAB in the last 72 hours. Anemia Panel: No results for input(s): VITAMINB12, FOLATE, FERRITIN, TIBC, IRON, RETICCTPCT in the last 72 hours. Urine analysis:    Component Value Date/Time   COLORURINE YELLOW  11/20/2018 0600   APPEARANCEUR CLEAR 11/20/2018 0600   LABSPEC 1.006 11/20/2018 0600   PHURINE 7.0 11/20/2018 0600   GLUCOSEU NEGATIVE 11/20/2018 0600   HGBUR SMALL (A) 11/20/2018 0600   BILIRUBINUR NEGATIVE 11/20/2018 0600   BILIRUBINUR neg 06/25/2018 1430   KETONESUR NEGATIVE 11/20/2018 0600   PROTEINUR NEGATIVE 11/20/2018 0600   UROBILINOGEN 0.2 06/25/2018 1430   NITRITE NEGATIVE 11/20/2018 0600   LEUKOCYTESUR NEGATIVE 11/20/2018 0600    Radiological Exams on Admission: Dg Chest 2 View  Result Date: 11/20/2018 CLINICAL DATA:  Cough and shortness of breath with wheezing EXAM: CHEST - 2 VIEW COMPARISON:  08/11/2018 FINDINGS: Chronic cardiomegaly. Prior median sternotomy and left atrial clipping. Limited detail due to soft tissue attenuation. There is no edema, consolidation, effusion, or pneumothorax. Artifact from EKG leads. IMPRESSION: No evidence of acute disease. Electronically Signed   By: Monte Fantasia M.D.   On: 11/20/2018 06:32    EKG: Independently reviewed. Atrial fibrillation with RVR with likely rate related changes  Assessment/Plan Principal Problem:   Atrial fibrillation with RVR (HCC) Active Problems:   CAD (coronary artery disease)   Influenza A   Asthma, chronic, unspecified asthma severity, with acute exacerbation   SIRS  (systemic inflammatory response syndrome) (HCC)  Atrial Fibrillation with RVR: Likely related to influenza and asthma exacerbation below ED attempted to transition to PO dilt, but she became tachy again Bolus 1 L and follow Continue home coreg Continue dilt gtt, transition to PO when able Echo from 05/2018 with EF 65-70% Continue warfarin, INR currently subtherapeutic  Influenza A:  No vaccine this year Started on tamiflu CXR without acute findings Follow blood cx  Asthma exacerbation:  xopenex prn wheezing Prednisone given persistent wheezing on exam  SIRS: temp to 100.3, tachy, WBC, tachypnea.  In setting of flu above, likely cause.  UA negative.  CXR without acute abnormality.   Blood cx pending Tamiflu as noted above  Hypokalemia: replace and follow.  Follow magnesium.  Hx CAD s/p CABG  Hx HFrEF and HFpEF:  Most recent echo with normal EF as noted above Continue carvedilol  Hypertension: coreg, diltiazem  DVT prophylaxis: warfarin  Code Status: full  Family Communication: none at bedside  Disposition Plan: pending improvement in HR, hopefully within 24 hrs Consults called: none  Admission status: observation   Fayrene Helper MD [facility] Pager AMION  If 7PM-7AM, please contact night-coverage [email or web address] Password [facility]  11/20/2018, 11:18 AM

## 2018-11-20 NOTE — ED Provider Notes (Signed)
7:44 AM Care assumed from Dr. Florina Ou.  At time of transfer care, patient is awaiting reassessment after rehydration and transitioning to oral rate control medications.  Patient was found to be in A. fib with RVR as well as concern for influenza.  Flu test was positive.  After some fluids and IV diltiazem, patient's rate has improved.  Patient's rate was in the 70s and she was feeling better.  Plan of care is to discharge the patient if she can maintain her heart rate below 100 and was feeling better with oral medications.  Patient will follow-up with PCP and cardiology team.  Anticipate discharge if HR remains normal after transition to oral meds.     9:45 AM Unfortunately, despite transitioning to oral diltiazem and rehydration with oral food and fluids, patient's heart rate has climbed back up to the 140s and 150s.  We gave more time to allow it to drop but it did not do so.  Suspect patient is still in A. fib with RVR in the setting of her influenza.  Although patient has no pneumonia, we cannot send her home going in the 150s.  Diltiazem drip will be restarted and patient will be admitted for further monitoring as she is fighting the flu.  Hospitalist team called for admission.   Tegeler, Gwenyth Allegra, MD 11/20/18 2047

## 2018-11-20 NOTE — ED Notes (Signed)
Patient was given PO Cardizem at 0738 and cardizem drip turned off at 0842. Patient has been off of Cardizem IV for an hour at this time, and patient's heart rate continues to fluctuate with the highest rate in the 140s-150s, Afib. Patient denies chest pain. Patient endorses SOB with coughing or exertion. Non-productive cough at this time with exertion. Patient lung sounds clear and diminished bilaterally. Dr. Gustavus Messing notified of patient vital signs. Dr Gustavus Messing came to patient bedside to assess, and updated patient and family of intent to admit patient to the hospital for further evaluation and cardizem drip treatment. Dr. Gustavus Messing verbal ordered this RN to restart Cardizem drip at 5mg /hr and titrate according to previous cardizem drip order. Patient and husband verbalized understanding.

## 2018-11-20 NOTE — Progress Notes (Signed)
ANTICOAGULATION CONSULT NOTE - Initial Consult  Pharmacy Consult for Warfarin Indication: atrial fibrillation  No Known Allergies  Patient Measurements: Height: 5\' 3"  (160 cm) Weight: 190 lb (86.2 kg) IBW/kg (Calculated) : 52.4  Vital Signs: Temp: 100.4 F (38 C) (02/13 1201) Temp Source: Oral (02/13 1201) BP: 175/80 (02/13 1201) Pulse Rate: 102 (02/13 1201)  Labs: Recent Labs    11/20/18 0600  HGB 14.2  HCT 43.3  PLT 223  LABPROT 20.9*  INR 1.83  CREATININE 0.68    Estimated Creatinine Clearance: 64.2 mL/min (by C-G formula based on SCr of 0.68 mg/dL).   Medical History: Past Medical History:  Diagnosis Date  . Allergy   . Asthma   . Atrial fibrillation ([place])   . Chronic combined systolic and diastolic heart failure ([place]) 05/14/2018  . Coronary artery disease   . Hypertension   . Pure hypercholesterolemia 05/14/2018  . Stented coronary artery     Medications:  Take 2.5MG  (1/2 tablet) Monday, wednesday, Friday and 5 MG (1 tablet) daily on Sunday, Tuesday, Thursday and Sunday as directed by coumadin clinic  Assessment: 73 y/o F with a h/o atrial fibrillation on chronic Coumadin admitted with SIRS and asthma exacerbation from flu as well as atrial fibrillation with rapid ventricular response. Pharmacy consulted to manage Coumadin dosing. No significant drug interactions. INR sub-therapeutic on admission. MD ordered Coumadin 5 mg once this AM.   CHA2DS2/VAS Stroke Risk Points: 5   Goal of Therapy:  INR 2-3   Plan:  Coumadin 2.5 mg tonight.  Daily INR.   Ulice Dash D 11/20/2018,1:11 PM

## 2018-11-20 NOTE — ED Notes (Signed)
ED TO INPATIENT HANDOFF REPORT  Name/Age/Gender Danielle Payne 73 y.o. female  Code Status Code Status History    Date Active Date Inactive Code Status Order ID Comments User Context   11/23/2014 0620 11/23/2014 1822 Full Code [id number]  Rise Patience, MD Inpatient      Home/SNF/Other Home  Chief Complaint Wheezing; Cough   Level of Care/Admitting Diagnosis ED Disposition    ED Disposition Condition [facility] Area: [place] [100102]  Level of Care: Stepdown [14]  Admit to SDU based on following criteria: Hemodynamic compromise or significant risk of instability:  Patient requiring short term acute titration and management of vasoactive drips, and invasive monitoring (i.e., CVP and Arterial line).  Diagnosis: Atrial fibrillation with RVR [facility]) [409811]  Admitting Physician: Elodia Florence [phone]  Attending Physician: Cephus Slater, A CALDWELL [phone]  PT Class (Do Not Modify): Observation [104]  PT Acc Code (Do Not Modify): Observation [10022]       Medical History Past Medical History:  Diagnosis Date  . Allergy   . Asthma   . Atrial fibrillation ([place])   . Chronic combined systolic and diastolic heart failure ([place]) 05/14/2018  . Coronary artery disease   . Hypertension   . Pure hypercholesterolemia 05/14/2018  . Stented coronary artery     Allergies No Known Allergies  IV Location/Drains/Wounds Patient Lines/Drains/Airways Status   Active Line/Drains/Airways    Name:   Placement date:   Placement time:   Site:   Days:   Peripheral IV 11/20/18 Right Antecubital   11/20/18    0606    Antecubital   less than 1   Peripheral IV 11/20/18 Left Wrist   11/20/18    0607    Wrist   less than 1          Labs/Imaging Results for orders placed or performed during the hospital encounter of 11/20/18 (from the past 48 hour(s))  Comprehensive metabolic panel     Status: Abnormal   Collection Time: 11/20/18  6:00 AM  Result  Value Ref Range   Sodium 139 135 - 145 mmol/L   Potassium 3.2 (L) 3.5 - 5.1 mmol/L   Chloride 106 98 - 111 mmol/L   CO2 24 22 - 32 mmol/L   Glucose, Bld 170 (H) 70 - 99 mg/dL   BUN 15 8 - 23 mg/dL   Creatinine, Ser 0.68 0.44 - 1.00 mg/dL   Calcium 8.8 (L) 8.9 - 10.3 mg/dL   Total Protein 7.7 6.5 - 8.1 g/dL   Albumin 4.3 3.5 - 5.0 g/dL   AST 19 15 - 41 U/L   ALT 20 0 - 44 U/L   Alkaline Phosphatase 79 38 - 126 U/L   Total Bilirubin 1.0 0.3 - 1.2 mg/dL   GFR calc non Af Amer >60 >60 mL/min   GFR calc Af Amer >60 >60 mL/min   Anion gap 9 5 - 15    Comment: Performed at [facility], [place] [address], [place], [place] [address]  Lactic acid, plasma     Status: None   Collection Time: 11/20/18  6:00 AM  Result Value Ref Range   Lactic Acid, Venous 1.4 0.5 - 1.9 mmol/L    Comment: Performed at [facility], [place] [address], [place], [place] [address]  CBC with Differential     Status: Abnormal   Collection Time: 11/20/18  6:00 AM  Result Value Ref Range   WBC 11.2 (  H) 4.0 - 10.5 K/uL   RBC 4.81 3.87 - 5.11 MIL/uL   Hemoglobin 14.2 12.0 - 15.0 g/dL   HCT 43.3 36.0 - 46.0 %   MCV 90.0 80.0 - 100.0 fL   MCH 29.5 26.0 - 34.0 pg   MCHC 32.8 30.0 - 36.0 g/dL   RDW 13.5 11.5 - 15.5 %   Platelets 223 150 - 400 K/uL   nRBC 0.0 0.0 - 0.2 %   Neutrophils Relative % 81 %   Neutro Abs 9.0 (H) 1.7 - 7.7 K/uL   Lymphocytes Relative 11 %   Lymphs Abs 1.3 0.7 - 4.0 K/uL   Monocytes Relative 6 %   Monocytes Absolute 0.7 0.1 - 1.0 K/uL   Eosinophils Relative 1 %   Eosinophils Absolute 0.1 0.0 - 0.5 K/uL   Basophils Relative 0 %   Basophils Absolute 0.1 0.0 - 0.1 K/uL   Immature Granulocytes 1 %   Abs Immature Granulocytes 0.06 0.00 - 0.07 K/uL    Comment: Performed at [facility], [place] [address] [place], [place] [address]  Protime-INR     Status: Abnormal   Collection Time: 11/20/18  6:00 AM  Result Value Ref Range   Prothrombin Time  20.9 (H) 11.4 - 15.2 seconds   INR 1.83     Comment: Performed at [facility], [place] [address], [place], [place] [address]  Urinalysis, Routine w reflex microscopic     Status: Abnormal   Collection Time: 11/20/18  6:00 AM  Result Value Ref Range   Color, Urine YELLOW YELLOW   APPearance CLEAR CLEAR   Specific Gravity, Urine 1.006 1.005 - 1.030   pH 7.0 5.0 - 8.0   Glucose, UA NEGATIVE NEGATIVE mg/dL   Hgb urine dipstick SMALL (A) NEGATIVE   Bilirubin Urine NEGATIVE NEGATIVE   Ketones, ur NEGATIVE NEGATIVE mg/dL   Protein, ur NEGATIVE NEGATIVE mg/dL   Nitrite NEGATIVE NEGATIVE   Leukocytes,Ua NEGATIVE NEGATIVE   RBC / HPF 0-5 0 - 5 RBC/hpf   WBC, UA 0-5 0 - 5 WBC/hpf   Bacteria, UA NONE SEEN NONE SEEN   Squamous Epithelial / LPF 0-5 0 - 5   Mucus PRESENT     Comment: Performed at [facility], [place] [address], [place], [place] [address]  Influenza panel by PCR (type A & B)     Status: Abnormal   Collection Time: 11/20/18  6:05 AM  Result Value Ref Range   Influenza A By PCR POSITIVE (A) NEGATIVE   Influenza B By PCR NEGATIVE NEGATIVE    Comment: (NOTE) The Xpert Xpress Flu assay is intended as an aid in the diagnosis of  influenza and should not be used as a sole basis for treatment.  This  assay is FDA approved for nasopharyngeal swab specimens only. Nasal  washings and aspirates are unacceptable for Xpert Xpress Flu testing. Performed at [facility], [place] [address], [place], [place] [address]    Dg Chest 2 View  Result Date: 11/20/2018 CLINICAL DATA:  Cough and shortness of breath with wheezing EXAM: CHEST - 2 VIEW COMPARISON:  08/11/2018 FINDINGS: Chronic cardiomegaly. Prior median sternotomy and left atrial clipping. Limited detail due to soft tissue attenuation. There is no edema, consolidation, effusion, or pneumothorax. Artifact from EKG leads. IMPRESSION: No evidence of acute disease. Electronically Signed   By:  Monte Fantasia M.D.   On: 11/20/2018 06:32   EKG Interpretation  Date/Time:  Thursday November 20 2018 05:52:05 EST Ventricular Rate:  157 PR Interval:    QRS Duration: 89 QT Interval:  260 QTC Calculation: 421 R Axis:   23 Text Interpretation:  Atrial fibrillation with RVR Repolarization abnormality, prob rate related Confirmed by Shanon Rosser [phone]) on 11/20/2018 6:13:20 AM Also confirmed by Shanon Rosser [phone]), editor Philomena Doheny [phone])  on 11/20/2018 8:35:11 AM   Pending Labs Unresulted Labs (From admission, onward)    Start     Ordered   11/20/18 0529  Lactic acid, plasma  Now then every 2 hours,   STAT     11/20/18 0529   11/20/18 0529  Culture, blood (Routine x 2)  BLOOD CULTURE X 2,   STAT     11/20/18 0529   Signed and Held  Comprehensive metabolic panel  Tomorrow morning,   R     Signed and Held   Signed and Held  CBC  Tomorrow morning,   R     Signed and Held          Vitals/Pain Today's Vitals   11/20/18 0930 11/20/18 0945 11/20/18 1000 11/20/18 1015  BP: (!) 137/101 (!) 157/83 (!) 144/95 (!) 140/98  Pulse: (!) 138   (!) 134  Resp: 19 19 20 20   Temp:      TempSrc:      SpO2: 95%   96%  Weight:      Height:        Isolation Precautions No active isolations  Medications Medications  diltiazem (CARDIZEM) 100 mg in dextrose 5 % 100 mL (1 mg/mL) infusion (5 mg/hr Intravenous Restarted 11/20/18 0949)  Warfarin - Physician Dosing Inpatient (has no administration in time range)  sodium chloride 0.9 % bolus 1,000 mL (has no administration in time range)  predniSONE (DELTASONE) tablet 40 mg (has no administration in time range)  levalbuterol (XOPENEX) nebulizer solution 0.63 mg (has no administration in time range)  albuterol (PROVENTIL) (2.5 MG/3ML) 0.083% nebulizer solution 5 mg (5 mg Nebulization Given 11/20/18 0645)  sodium chloride flush (NS) 0.9 % injection 3 mL (3 mLs Intravenous Given 11/20/18 0630)  diltiazem (CARDIZEM) 1 mg/mL load via infusion 20  mg (20 mg Intravenous Bolus from Bag 11/20/18 0633)  ondansetron (ZOFRAN) injection 4 mg (4 mg Intravenous Given 11/20/18 0626)  warfarin (COUMADIN) tablet 5 mg (5 mg Oral Given 11/20/18 0701)  potassium chloride SA (K-DUR,KLOR-CON) CR tablet 40 mEq (40 mEq Oral Given 11/20/18 0701)  diltiazem (CARDIZEM SR) 12 hr capsule 90 mg (90 mg Oral Given 11/20/18 0738)  oseltamivir (TAMIFLU) capsule 75 mg (75 mg Oral Given 11/20/18 0738)    Mobility walks with person assist

## 2018-11-20 NOTE — ED Notes (Signed)
Patient ambulated to restroom in hallway with standby assistance. Patient tachypnic and tachycardic upon return to her room. Patient denies CP.

## 2018-11-21 DIAGNOSIS — I4891 Unspecified atrial fibrillation: Principal | ICD-10-CM

## 2018-11-21 LAB — COMPREHENSIVE METABOLIC PANEL
ALT: 20 U/L (ref 0–44)
AST: 17 U/L (ref 15–41)
Albumin: 3.6 g/dL (ref 3.5–5.0)
Alkaline Phosphatase: 52 U/L (ref 38–126)
Anion gap: 7 (ref 5–15)
BUN: 19 mg/dL (ref 8–23)
CALCIUM: 8.5 mg/dL — AB (ref 8.9–10.3)
CO2: 23 mmol/L (ref 22–32)
CREATININE: 0.85 mg/dL (ref 0.44–1.00)
Chloride: 107 mmol/L (ref 98–111)
GFR calc Af Amer: >60 mL/min (ref >60)
Glucose, Bld: 146 mg/dL — ABNORMAL HIGH (ref 70–99)
Potassium: 3.5 mmol/L (ref 3.5–5.1)
Sodium: 137 mmol/L (ref 135–145)
Total Bilirubin: 0.6 mg/dL (ref 0.3–1.2)
Total Protein: 6.5 g/dL (ref 6.5–8.1)

## 2018-11-21 LAB — PROTIME-INR
INR: 1.77
Prothrombin Time: 20.4 seconds — ABNORMAL HIGH (ref 11.4–15.2)

## 2018-11-21 LAB — CBC
HCT: 39 % (ref 36.0–46.0)
Hemoglobin: 12.4 g/dL (ref 12.0–15.0)
MCH: 28.7 pg (ref 26.0–34.0)
MCHC: 31.8 g/dL (ref 30.0–36.0)
MCV: 90.3 fL (ref 80.0–100.0)
PLATELETS: 170 10*3/uL (ref 150–400)
RBC: 4.32 MIL/uL (ref 3.87–5.11)
RDW: 13.9 % (ref 11.5–15.5)
WBC: 6.3 10*3/uL (ref 4.0–10.5)
nRBC: 0 % (ref 0.0–0.2)

## 2018-11-21 LAB — MAGNESIUM: MAGNESIUM: 2 mg/dL (ref 1.7–2.4)

## 2018-11-21 MED ORDER — OSELTAMIVIR PHOSPHATE 75 MG PO CAPS: 75.0000 mg | ORAL_CAPSULE | Freq: Two times a day (BID) | ORAL | 0 refills | Status: DC

## 2018-11-21 MED ORDER — PREDNISONE 10 MG PO TABS: ORAL_TABLET | ORAL | 0 refills | Status: DC

## 2018-11-21 NOTE — Care Management Obs Status (Signed)
[place] NOTIFICATION   Patient Details  Name: Danielle Payne MRN: [id number] Date of Birth: Feb 18, 1946   Medicare Observation Status Notification Given:  Yes    Leeroy Cha, RN 11/21/2018, 10:36 AM

## 2018-11-21 NOTE — Discharge Summary (Signed)
Physician Discharge Summary  Danielle Payne ZJQ:[id number] DOB: 05-28-1946 DOA: 11/20/2018  PCP: Luetta Nutting, DO  Admit date: 11/20/2018 Discharge date: 11/21/2018  Admitted From: Home Disposition: Home  Recommendations for Outpatient Follow-up:  1. Follow up with PCP in 1-2 weeks 2. Please obtain BMP/CBC in one week   Home Health: None Equipment/Devices: None  Discharge Condition stable CODE STATUS: Full code Diet recommendation: Cardiac Brief/Interim Summary:73 y.o. female with medical history significant of coronary disease status post CABG, hypertension, atrial fibrillation on warfarin, history of combined systolic and diastolic heart failure who presented after developing wheezing last night and shortness of breath.  She is found to be flu a positive has been admitted for A. fib with RVR.    Patient notes her symptoms started last night.  She describes this as wheezing which she noted was severe.  She notes shortness of breath due to this.  She denies a history of COPD, but does have asthma.  She does not smoke.  She states that she also felt like she had the flu.  She felt weak and had body aches.  She notes fever.  She denies chest pain.  She endorses shortness of breath noted above.  She denies any abdominal pain.  She notes nausea, but no vomiting.  She denies any urinary or bowel abnormalities.  She denies any palpitations.    ED Course: Labs, EKG, chest x-ray.  Noted to have influenza as well as A. fib with RVR.  Started on diltiazem drip, but unable to wean off.  Admit for persistent RVR in the setting of influenza and asthma exacerbation.  Discharge Diagnoses:  Principal Problem:   Atrial fibrillation with RVR ([place]) Active Problems:   CAD (coronary artery disease)   Influenza A   Asthma, chronic, unspecified asthma severity, with acute exacerbation   SIRS (systemic inflammatory response syndrome) ([facility])   #1 influenza A with asthma exacerbation patient reports  feeling better wheezing is under control on p.o. prednisone will discharge patient on prednisone and Tamiflu.  #2 A. fib RVR rate controlled at 60 continue Cardizem as she is taking at home continue Coumadin.  #3 history of CAD CABG diastolic heart failure continue carvedilol and Cardizem and Coumadin.  #4 hypertension continue Coreg and diltiazem as she was taking at home.  Estimated body mass index is 34.33 kg/m as calculated from the following:   Height as of this encounter: 5\' 3"  (1.6 m).   Weight as of this encounter: 87.9 kg.  Discharge Instructions   Allergies as of 11/21/2018   No Known Allergies     Medication List    TAKE these medications   albuterol 108 (90 Base) MCG/ACT inhaler Commonly known as:  PROVENTIL HFA;VENTOLIN HFA Inhale 2 puffs into the lungs every 4 (four) hours as needed for wheezing or shortness of breath.   carvedilol 25 MG tablet Commonly known as:  COREG Take 1 tablet (25 mg total) by mouth 2 (two) times daily with a meal.   CLARITIN PO Take 10 mg by mouth daily.   diltiazem 180 MG 24 hr capsule Commonly known as:  CARDIZEM CD Take 1 capsule (180 mg total) by mouth daily.   oseltamivir 75 MG capsule Commonly known as:  TAMIFLU Take 1 capsule (75 mg total) by mouth 2 (two) times daily.   OVER THE COUNTER MEDICATION Take 1 capsule by mouth at bedtime. Sleep Essentials   predniSONE 10 MG tablet Commonly known as:  DELTASONE Take 40 mg daily for 3 days  then 30 mg daily for the following 3 days and then 20 mg daily for the next 3 days then 10 mg daily till done.   warfarin 5 MG tablet Commonly known as:  COUMADIN Take as directed. If you are unsure how to take this medication, talk to your nurse or doctor. Original instructions:  Take 1/2 to 1 tablets daily as directed by coumadin clinic What changed:    how much to take  how to take this  when to take this  additional instructions      Follow-up Information    Luetta Nutting,  DO Follow up.   Specialty:  Family Medicine Contact information: [place] [id number] [phone]          No Known Allergies  Consultations: None  Procedures/Studies: Dg Chest 2 View  Result Date: 11/20/2018 CLINICAL DATA:  Cough and shortness of breath with wheezing EXAM: CHEST - 2 VIEW COMPARISON:  08/11/2018 FINDINGS: Chronic cardiomegaly. Prior median sternotomy and left atrial clipping. Limited detail due to soft tissue attenuation. There is no edema, consolidation, effusion, or pneumothorax. Artifact from EKG leads. IMPRESSION: No evidence of acute disease. Electronically Signed   By: Monte Fantasia M.D.   On: 11/20/2018 06:32   (Echo, Carotid, EGD, Colonoscopy, ERCP)    Subjective:  Sitting up writing in her diary on room air Discharge Exam: Vitals:   11/21/18 0600 11/21/18 0700  BP: (!) 147/56 (!) 158/83  Pulse: 79 80  Resp:  16  Temp:    SpO2: 97% 96%   Vitals:   11/21/18 0400 11/21/18 0500 11/21/18 0600 11/21/18 0700  BP: (!) 158/76 (!) 147/86 (!) 147/56 (!) 158/83  Pulse: 81 81 79 80  Resp: 15 15  16   Temp: 97.6 F (36.4 C)     TempSrc: Axillary     SpO2: 96% 95% 97% 96%  Weight:  87.9 kg    Height:        General: Pt is alert, awake, not in acute distress Cardiovascular: RRR, S1/S2 +, no rubs, no gallops Respiratory: Few scattered wheezes bilaterally, no wheezing, no rhonchi Abdominal: Soft, NT, ND, bowel sounds + Extremities: no edema, no cyanosis    The results of significant diagnostics from this hospitalization (including imaging, microbiology, ancillary and laboratory) are listed below for reference.     Microbiology: Recent Results (from the past 240 hour(s))  Culture, blood (Routine x 2)     Status: None (Preliminary result)   Collection Time: 11/20/18  6:00 AM  Result Value Ref Range Status   Specimen Description   Final    BLOOD LEFT ANTECUBITAL Performed at [facility]  [address] [place], [place] [address]    Special Requests   Final    BOTTLES DRAWN AEROBIC AND ANAEROBIC Blood Culture results may not be optimal due to an excessive volume of blood received in culture bottles Performed at [facility] [address], [place], [place] [address]    Culture   Final    NO GROWTH < 24 HOURS Performed at [facility] [address], [place], [place] [address]    Report Status PENDING  Incomplete  Culture, blood (Routine x 2)     Status: None (Preliminary result)   Collection Time: 11/20/18  6:00 AM  Result Value Ref Range Status   Specimen Description   Final    BLOOD RIGHT WRIST Performed at [facility] [address] [place],  [place] [address]    Special Requests   Final    BOTTLES DRAWN AEROBIC AND ANAEROBIC Blood Culture results may not be optimal due to an excessive volume of blood received in culture bottles Performed at [facility] [address], [place], [place] [address]    Culture   Final    NO GROWTH < 24 HOURS Performed at [facility] [address] [place], [place] [address]    Report Status PENDING  Incomplete  MRSA PCR Screening     Status: None   Collection Time: 11/20/18 12:10 PM  Result Value Ref Range Status   MRSA by PCR NEGATIVE NEGATIVE Final    Comment:        The GeneXpert MRSA Assay (FDA approved for NASAL specimens only), is one component of a comprehensive MRSA colonization surveillance program. It is not intended to diagnose MRSA infection nor to guide or monitor treatment for MRSA infections. Performed at [facility], [place] [address], [place], [place] [address]      Labs: BNP (last 3 results) No results for input(s): BNP in the last 8760 hours. Basic Metabolic Panel: Recent Labs  Lab 11/20/18 0600 11/21/18 0310  NA 139 137  K 3.2* 3.5  CL 106 107  CO2 24 23  GLUCOSE 170* 146*  BUN 15 19  CREATININE 0.68  0.85  CALCIUM 8.8* 8.5*  MG 1.9 2.0   Liver Function Tests: Recent Labs  Lab 11/20/18 0600 11/21/18 0310  AST 19 17  ALT 20 20  ALKPHOS 79 52  BILITOT 1.0 0.6  PROT 7.7 6.5  ALBUMIN 4.3 3.6   No results for input(s): LIPASE, AMYLASE in the last 168 hours. No results for input(s): AMMONIA in the last 168 hours. CBC: Recent Labs  Lab 11/20/18 0600 11/21/18 0310  WBC 11.2* 6.3  NEUTROABS 9.0*  --   HGB 14.2 12.4  HCT 43.3 39.0  MCV 90.0 90.3  PLT 223 170   Cardiac Enzymes: No results for input(s): CKTOTAL, CKMB, CKMBINDEX, TROPONINI in the last 168 hours. BNP: Invalid input(s): POCBNP CBG: No results for input(s): GLUCAP in the last 168 hours. D-Dimer No results for input(s): DDIMER in the last 72 hours. Hgb A1c No results for input(s): HGBA1C in the last 72 hours. Lipid Profile No results for input(s): CHOL, HDL, LDLCALC, TRIG, CHOLHDL, LDLDIRECT in the last 72 hours. Thyroid function studies No results for input(s): TSH, T4TOTAL, T3FREE, THYROIDAB in the last 72 hours.  Invalid input(s): FREET3 Anemia work up No results for input(s): VITAMINB12, FOLATE, FERRITIN, TIBC, IRON, RETICCTPCT in the last 72 hours. Urinalysis    Component Value Date/Time   COLORURINE YELLOW 11/20/2018 0600   APPEARANCEUR CLEAR 11/20/2018 0600   LABSPEC 1.006 11/20/2018 0600   PHURINE 7.0 11/20/2018 0600   GLUCOSEU NEGATIVE 11/20/2018 0600   HGBUR SMALL (A) 11/20/2018 0600   BILIRUBINUR NEGATIVE 11/20/2018 0600   BILIRUBINUR neg 06/25/2018 1430   KETONESUR NEGATIVE 11/20/2018 0600   PROTEINUR NEGATIVE 11/20/2018 0600   UROBILINOGEN 0.2 06/25/2018 1430   NITRITE NEGATIVE 11/20/2018 0600   LEUKOCYTESUR NEGATIVE 11/20/2018 0600   Sepsis Labs Invalid input(s): PROCALCITONIN,  WBC,  LACTICIDVEN Microbiology Recent Results (from the past 240 hour(s))  Culture, blood (Routine x 2)     Status: None (Preliminary result)   Collection Time: 11/20/18  6:00 AM  Result Value Ref Range  Status   Specimen Description   Final    BLOOD LEFT ANTECUBITAL Performed at [facility]  [place] [address], [place], [place] [address]    Special Requests   Final    BOTTLES DRAWN AEROBIC AND ANAEROBIC Blood Culture results may not be optimal due to an excessive volume of blood received in culture bottles Performed at [facility] [address], [place], [place] [address]    Culture   Final    NO GROWTH < 24 HOURS Performed at [facility] [address] [place], [place] [address]    Report Status PENDING  Incomplete  Culture, blood (Routine x 2)     Status: None (Preliminary result)   Collection Time: 11/20/18  6:00 AM  Result Value Ref Range Status   Specimen Description   Final    BLOOD RIGHT WRIST Performed at [facility] [address] [place], [place] [address]    Special Requests   Final    BOTTLES DRAWN AEROBIC AND ANAEROBIC Blood Culture results may not be optimal due to an excessive volume of blood received in culture bottles Performed at [facility] [address] [place], [place] [address]    Culture   Final    NO GROWTH < 24 HOURS Performed at [facility] [address] [place], [place] [address]    Report Status PENDING  Incomplete  MRSA PCR Screening     Status: None   Collection Time: 11/20/18 12:10 PM  Result Value Ref Range Status   MRSA by PCR NEGATIVE NEGATIVE Final    Comment:        The GeneXpert MRSA Assay (FDA approved for NASAL specimens only), is one component of a comprehensive MRSA colonization surveillance program. It is not intended to diagnose MRSA infection nor to guide or monitor treatment for MRSA infections. Performed at [facility], [place] [address], [place], [place] [address]      Time coordinating discharge:  34 minutes  SIGNED:   Georgette Shell, MD  [facility] 11/21/2018, 8:33 AM Pager   If  7PM-7AM, please contact night-coverage [email or web address] Password [email or web address]

## 2018-11-21 NOTE — Progress Notes (Signed)
Daughter arrived pt left with daughter.

## 2018-11-21 NOTE — Care Management Note (Signed)
Case Management Note  Patient Details  Name: Danielle Payne MRN: [id number] Date of Birth: 02/12/1946  Subjective/Objective:                  Discharged   Action/Plan: Discharged to home with self-care and family support Orders checked for hhc needs. No CM needs present at time of discharge. Patient prefers to make own follow appointments has needed.  Expected Discharge Date:  11/21/18               Expected Discharge Plan:  Home/Self Care  In-House Referral:  NA  Discharge planning Services  CM Consult  Post Acute Care Choice:  NA Choice offered to:  NA  DME Arranged:  N/A DME Agency:     HH Arranged:  NA HH Agency:     Status of Service:  Completed, signed off  If discussed at [place] of Stay Meetings, dates discussed:    Additional Comments:  Leeroy Cha, RN 11/21/2018, 10:25 AM

## 2018-11-21 NOTE — Progress Notes (Signed)
Pt given discharge instructions with understanding. Pt has no questions at this time. Pt awaiting family for ride home.

## 2018-11-24 ENCOUNTER — Telehealth: Payer: Self-pay | Admitting: Behavioral Health

## 2018-11-24 NOTE — Telephone Encounter (Signed)
Transition Care Management Follow-up Telephone Call  PCP: Luetta Nutting, DO  Admit date: 11/20/2018 Discharge date: 11/21/2018  Admitted From: Home Disposition: Home  Recommendations for Outpatient Follow-up:  1. Follow up with PCP in 1-2 weeks 2. Please obtain BMP/CBC in one week   Home Health: None Equipment/Devices: None  Discharge Condition stable   How have you been since you were released from the hospital? Patient stated, "I'm doing pretty good".   Do you understand why you were in the hospital? yes   Do you understand the discharge instructions? yes   Where were you discharged to? Home   Items Reviewed:  Medications reviewed: yes  Allergies reviewed: yes, NKA  Dietary changes reviewed: yes, heart healthy diet.  Referrals reviewed: yes, Follow up with PCP in 1-2 weeks.   Functional Questionnaire:   Activities of Daily Living (ADLs):   She states they are independent in the following: ambulation, bathing and hygiene, feeding, continence, grooming, toileting and dressing States they require assistance with the following: None   Any transportation issues/concerns?: no   Any patient concerns? no   Confirmed importance and date/time of follow-up visits scheduled yes, 11/27/2018 at 8:30 AM.  Provider Appointment booked with Dr. Zigmund Daniel.    Confirmed with patient if condition begins to worsen call PCP or go to the ER.  Patient was given the office number and encouraged to call back with question or concerns.  : yes

## 2018-11-25 LAB — CULTURE, BLOOD (ROUTINE X 2)
Culture: NO GROWTH
Culture: NO GROWTH

## 2018-11-27 ENCOUNTER — Encounter: Payer: Self-pay | Admitting: Family Medicine

## 2018-11-27 ENCOUNTER — Ambulatory Visit (INDEPENDENT_AMBULATORY_CARE_PROVIDER_SITE_OTHER): Payer: Medicare Other | Admitting: Family Medicine

## 2018-11-27 VITALS — BP 141/82 | HR 86 | Temp 97.9°F | Ht 63.0 in | Wt 193.0 lb

## 2018-11-27 DIAGNOSIS — I4891 Unspecified atrial fibrillation: Secondary | ICD-10-CM | POA: Diagnosis not present

## 2018-11-27 DIAGNOSIS — J45901 Unspecified asthma with (acute) exacerbation: Secondary | ICD-10-CM

## 2018-11-27 DIAGNOSIS — R7309 Other abnormal glucose: Secondary | ICD-10-CM

## 2018-11-27 DIAGNOSIS — J101 Influenza due to other identified influenza virus with other respiratory manifestations: Secondary | ICD-10-CM

## 2018-11-27 DIAGNOSIS — I10 Essential (primary) hypertension: Secondary | ICD-10-CM

## 2018-11-27 LAB — CBC
HCT: 46.2 % — ABNORMAL HIGH (ref 36.0–46.0)
HEMOGLOBIN: 15.1 g/dL — AB (ref 12.0–15.0)
MCHC: 32.8 g/dL (ref 30.0–36.0)
MCV: 89.2 fl (ref 78.0–100.0)
Platelets: 282 10*3/uL (ref 150.0–400.0)
RBC: 5.18 Mil/uL — ABNORMAL HIGH (ref 3.87–5.11)
RDW: 14 % (ref 11.5–15.5)
WBC: 14.5 10*3/uL — ABNORMAL HIGH (ref 4.0–10.5)

## 2018-11-27 LAB — BASIC METABOLIC PANEL
BUN: 22 mg/dL (ref 6–23)
CO2: 33 mEq/L — ABNORMAL HIGH (ref 19–32)
Calcium: 9.4 mg/dL (ref 8.4–10.5)
Chloride: 99 mEq/L (ref 96–112)
Creatinine, Ser: 1.14 mg/dL (ref 0.40–1.20)
GFR: 46.55 mL/min — AB (ref >60.00)
Glucose, Bld: 167 mg/dL — ABNORMAL HIGH (ref 70–99)
Potassium: 3.9 mEq/L (ref 3.5–5.1)
Sodium: 140 mEq/L (ref 135–145)

## 2018-11-27 NOTE — Assessment & Plan Note (Signed)
BP stable.

## 2018-11-27 NOTE — Assessment & Plan Note (Signed)
-  Pulmonary exam reassuring today -Continue albuterol as needed and complete course of prednisone.

## 2018-11-27 NOTE — Assessment & Plan Note (Signed)
-  Rate well controlled at this time with current dose of coreg and diltiazem.   -Continue current medications.

## 2018-11-27 NOTE — Assessment & Plan Note (Signed)
-  Completed tamiflu, no further fevers.

## 2018-11-27 NOTE — Patient Instructions (Signed)
Complete steroid We'll call you with lab results Let me know if you have any worsening symptoms.

## 2018-11-27 NOTE — Progress Notes (Signed)
Danielle Payne - 73 y.o. female MRN [id number]  Date of birth: 18-Jan-1946  Subjective Chief Complaint  Patient presents with  . Hospitalization Follow-up    completed tamilflu-still taking prednisone. Has been feeling fatigued.     HPI Danielle Payne is a 73 y.o. female with history of CAD with A. Fib, asthma and HTN here today for hospital follow up.  She was hospitalized from 2/13-2/14/2020 with diagnosis of influenza A, asthma exacerbation and A. Fib w/ RVR.  She was initially admitted to step down unit and placed on diltiazem drip. Transitioned to PO once rate was better controlled and rate remained well controlled throughout remainder of hospitalization.  She was treated with tamiflu, which she has completed and prednisone, which she continues on.  She reports that today she still feels fatigued but denies shortness of breath, palpitations, chest pain, dizziness, fever, chills, or headache.   ROS:  A comprehensive ROS was completed and negative except as noted per HPI  No Known Allergies  Past Medical History:  Diagnosis Date  . Allergy   . Asthma   . Atrial fibrillation ([place])   . Chronic combined systolic and diastolic heart failure ([place]) 05/14/2018  . Coronary artery disease   . Hypertension   . Pure hypercholesterolemia 05/14/2018  . Stented coronary artery     Past Surgical History:  Procedure Laterality Date  . ABDOMINAL HYSTERECTOMY    . CARDIAC CATHETERIZATION    . CARDIOVERSION N/A 12/21/2014   Procedure: CARDIOVERSION;  Surgeon: Adrian Prows, MD;  Location: [facility];  Service: Cardiovascular;  Laterality: N/A;  . CORONARY ANGIOPLASTY    . CORONARY ARTERY BYPASS GRAFT      Social History   Socioeconomic History  . Marital status: Married    Spouse name: Not on file  . Number of children: Not on file  . Years of education: Not on file  . Highest education level: Not on file  Occupational History  . Not on file  Social Needs  . Financial resource strain: Not  on file  . Food insecurity:    Worry: Not on file    Inability: Not on file  . Transportation needs:    Medical: Not on file    Non-medical: Not on file  Tobacco Use  . Smoking status: Never Smoker  . Smokeless tobacco: Never Used  Substance and Sexual Activity  . Alcohol use: No  . Drug use: No  . Sexual activity: Not on file  Lifestyle  . Physical activity:    Days per week: Not on file    Minutes per session: Not on file  . Stress: Not on file  Relationships  . Social connections:    Talks on phone: Not on file    Gets together: Not on file    Attends religious service: Not on file    Active member of club or organization: Not on file    Attends meetings of clubs or organizations: Not on file    Relationship status: Not on file  Other Topics Concern  . Not on file  Social History Narrative  . Not on file    Family History  Problem Relation Age of Onset  . CAD Mother   . Stroke Mother   . Hypertension Mother   . Prostate cancer Father   . Diabetes Mellitus II Sister   . Diabetes Mellitus II Brother   . CAD Maternal Aunt     Health Maintenance  Topic Date Due  . Hepatitis C  Screening  1945/11/05  . MAMMOGRAM  07/24/1994  . COLONOSCOPY  07/24/1994  . DEXA SCAN  07/24/2009  . PNA vac Low Risk Adult (1 of 2 - PCV13) 07/24/2009  . TETANUS/TDAP  07/07/2018  . INFLUENZA VACCINE  01/06/2019 (Originally 05/08/2018)    ----------------------------------------------------------------------------------------------------------------------------------------------------------------------------------------------------------------- Physical Exam BP (!) 141/82   Pulse 86   Temp 97.9 F (36.6 C) (Oral)   Ht 5\' 3"  (1.6 m)   Wt 193 lb (87.5 kg)   SpO2 98%   BMI 34.19 kg/m   Physical Exam Constitutional:      Appearance: Normal appearance.  HENT:     Head: Normocephalic and atraumatic.     Right Ear: Tympanic membrane normal.     Left Ear: Tympanic membrane normal.       Mouth/Throat:     Mouth: Mucous membranes are moist.  Eyes:     General: No scleral icterus. Neck:     Musculoskeletal: Neck supple.  Cardiovascular:     Rate and Rhythm: Normal rate. Rhythm irregular.  Pulmonary:     Effort: Pulmonary effort is normal.     Breath sounds: Normal breath sounds.  Skin:    General: Skin is warm and dry.  Neurological:     General: No focal deficit present.     Mental Status: She is alert.  Psychiatric:        Mood and Affect: Mood normal.        Behavior: Behavior normal.     ------------------------------------------------------------------------------------------------------------------------------------------------------------------------------------------------------------------- Assessment and Plan  Atrial fibrillation with RVR (HCC) -Rate well controlled at this time with current dose of coreg and diltiazem.   -Continue current medications.   Essential hypertension -BP stable  Influenza A -Completed tamiflu, no further fevers.    Asthma, chronic, unspecified asthma severity, with acute exacerbation -Pulmonary exam reassuring today -Continue albuterol as needed and complete course of prednisone.

## 2018-11-28 ENCOUNTER — Ambulatory Visit (INDEPENDENT_AMBULATORY_CARE_PROVIDER_SITE_OTHER): Payer: Medicare Other | Admitting: *Deleted

## 2018-11-28 DIAGNOSIS — Z7901 Long term (current) use of anticoagulants: Secondary | ICD-10-CM

## 2018-11-28 DIAGNOSIS — I4891 Unspecified atrial fibrillation: Secondary | ICD-10-CM

## 2018-11-28 LAB — POCT INR: INR: 2.9 (ref 2.0–3.0)

## 2018-11-28 NOTE — Patient Instructions (Signed)
Description   Continue 1 tablet daily except for 1/2 tablet each Monday, Wednesday, and Friday. Repeat INR in 4 weeks.

## 2018-12-02 ENCOUNTER — Other Ambulatory Visit: Payer: Self-pay | Admitting: Family Medicine

## 2018-12-02 DIAGNOSIS — D72829 Elevated white blood cell count, unspecified: Secondary | ICD-10-CM

## 2018-12-02 NOTE — Progress Notes (Signed)
WBC and glucose are elevated, likely coming from prednisone.  I would recommend that we re-check a  cbc in 2-3 weeks.

## 2018-12-09 ENCOUNTER — Other Ambulatory Visit: Payer: Self-pay | Admitting: Family Medicine

## 2018-12-11 DIAGNOSIS — M9902 Segmental and somatic dysfunction of thoracic region: Secondary | ICD-10-CM | POA: Diagnosis not present

## 2018-12-11 DIAGNOSIS — M9901 Segmental and somatic dysfunction of cervical region: Secondary | ICD-10-CM | POA: Diagnosis not present

## 2018-12-11 DIAGNOSIS — M542 Cervicalgia: Secondary | ICD-10-CM | POA: Diagnosis not present

## 2018-12-11 DIAGNOSIS — M47812 Spondylosis without myelopathy or radiculopathy, cervical region: Secondary | ICD-10-CM | POA: Diagnosis not present

## 2018-12-16 ENCOUNTER — Other Ambulatory Visit (INDEPENDENT_AMBULATORY_CARE_PROVIDER_SITE_OTHER): Payer: Medicare Other

## 2018-12-16 DIAGNOSIS — D72829 Elevated white blood cell count, unspecified: Secondary | ICD-10-CM

## 2018-12-16 LAB — CBC WITH DIFFERENTIAL/PLATELET
BASOS ABS: 0.1 10*3/uL (ref 0.0–0.1)
Basophils Relative: 1 % (ref 0.0–3.0)
Eosinophils Absolute: 0.2 10*3/uL (ref 0.0–0.7)
Eosinophils Relative: 2.8 % (ref 0.0–5.0)
HCT: 42.7 % (ref 36.0–46.0)
Hemoglobin: 14.4 g/dL (ref 12.0–15.0)
Lymphocytes Relative: 26.9 % (ref 12.0–46.0)
Lymphs Abs: 2.2 10*3/uL (ref 0.7–4.0)
MCHC: 33.8 g/dL (ref 30.0–36.0)
MCV: 87.9 fl (ref 78.0–100.0)
Monocytes Absolute: 0.6 10*3/uL (ref 0.1–1.0)
Monocytes Relative: 7.5 % (ref 3.0–12.0)
Neutro Abs: 5 10*3/uL (ref 1.4–7.7)
Neutrophils Relative %: 61.8 % (ref 43.0–77.0)
Platelets: 263 10*3/uL (ref 150.0–400.0)
RBC: 4.86 Mil/uL (ref 3.87–5.11)
RDW: 14.3 % (ref 11.5–15.5)
WBC: 8 10*3/uL (ref 4.0–10.5)

## 2018-12-17 ENCOUNTER — Telehealth: Payer: Self-pay | Admitting: Family Medicine

## 2018-12-17 NOTE — Telephone Encounter (Signed)
Pt given results per notes of Dr Zigmund Daniel on 12/17/18. Unable to document in result note due to result note not being routed to [facility].

## 2018-12-17 NOTE — Telephone Encounter (Signed)
Patient calling back for lab results. NT unavailable.    Copied from [place] [id number]. Topic: Quick Communication - Lab Results (Clinic Use ONLY) >> Dec 17, 2018 11:31 AM Marin Roberts, RMA wrote: Called patient to inform them of 12/16/18 lab results. When patient returns call, triage nurse may disclose results.

## 2018-12-25 ENCOUNTER — Telehealth: Payer: Self-pay | Admitting: Pharmacist Clinician (PhC)/ Clinical Pharmacy Specialist

## 2018-12-25 NOTE — Telephone Encounter (Signed)
Moved appt 2 weeks

## 2019-01-08 ENCOUNTER — Telehealth: Payer: Self-pay

## 2019-01-08 NOTE — Telephone Encounter (Signed)
lmom for prescreen/drive thru and for rescheduling at [facility]

## 2019-01-14 ENCOUNTER — Telehealth: Payer: Self-pay

## 2019-01-14 NOTE — Telephone Encounter (Signed)
lmom for prescreen/drive up 

## 2019-01-15 ENCOUNTER — Ambulatory Visit (INDEPENDENT_AMBULATORY_CARE_PROVIDER_SITE_OTHER): Payer: Medicare Other | Admitting: Pharmacist

## 2019-01-15 ENCOUNTER — Other Ambulatory Visit: Payer: Self-pay

## 2019-01-15 DIAGNOSIS — Z7901 Long term (current) use of anticoagulants: Secondary | ICD-10-CM

## 2019-01-15 DIAGNOSIS — I4891 Unspecified atrial fibrillation: Secondary | ICD-10-CM

## 2019-01-15 LAB — POCT INR: INR: 2 (ref 2.0–3.0)

## 2019-01-15 NOTE — Patient Instructions (Signed)
Description   Spoke with pt and instructed pt to take 1 tablet daily except for 1/2 tablet each Monday, Wednesday, and Friday. Repeat INR in 6 weeks. Pt not interested in NOAC.

## 2019-02-12 DIAGNOSIS — M47812 Spondylosis without myelopathy or radiculopathy, cervical region: Secondary | ICD-10-CM | POA: Diagnosis not present

## 2019-02-12 DIAGNOSIS — M542 Cervicalgia: Secondary | ICD-10-CM | POA: Diagnosis not present

## 2019-02-12 DIAGNOSIS — M9902 Segmental and somatic dysfunction of thoracic region: Secondary | ICD-10-CM | POA: Diagnosis not present

## 2019-02-12 DIAGNOSIS — M9901 Segmental and somatic dysfunction of cervical region: Secondary | ICD-10-CM | POA: Diagnosis not present

## 2019-02-16 ENCOUNTER — Ambulatory Visit: Payer: Medicare Other | Admitting: Family Medicine

## 2019-02-19 DIAGNOSIS — M9902 Segmental and somatic dysfunction of thoracic region: Secondary | ICD-10-CM | POA: Diagnosis not present

## 2019-02-19 DIAGNOSIS — M47812 Spondylosis without myelopathy or radiculopathy, cervical region: Secondary | ICD-10-CM | POA: Diagnosis not present

## 2019-02-19 DIAGNOSIS — M9901 Segmental and somatic dysfunction of cervical region: Secondary | ICD-10-CM | POA: Diagnosis not present

## 2019-02-19 DIAGNOSIS — M542 Cervicalgia: Secondary | ICD-10-CM | POA: Diagnosis not present

## 2019-02-23 DIAGNOSIS — M47812 Spondylosis without myelopathy or radiculopathy, cervical region: Secondary | ICD-10-CM | POA: Diagnosis not present

## 2019-02-23 DIAGNOSIS — M9902 Segmental and somatic dysfunction of thoracic region: Secondary | ICD-10-CM | POA: Diagnosis not present

## 2019-02-23 DIAGNOSIS — M9901 Segmental and somatic dysfunction of cervical region: Secondary | ICD-10-CM | POA: Diagnosis not present

## 2019-02-23 DIAGNOSIS — M542 Cervicalgia: Secondary | ICD-10-CM | POA: Diagnosis not present

## 2019-02-24 ENCOUNTER — Telehealth: Payer: Self-pay | Admitting: *Deleted

## 2019-02-24 NOTE — Telephone Encounter (Signed)
Spoke with patient and switched her upcoming visit to video visit. Patient aware insurance will be billed and best possible care via video/telephone visit will be given. Verbal consent given by patient for visit

## 2019-02-25 ENCOUNTER — Telehealth: Payer: Self-pay | Admitting: Cardiovascular Disease

## 2019-02-25 ENCOUNTER — Telehealth: Payer: Self-pay

## 2019-02-25 NOTE — Telephone Encounter (Signed)
lmom for prescreen  

## 2019-02-25 NOTE — Telephone Encounter (Signed)
iphone/ consent/ my chart declined/ pre reg completed

## 2019-02-26 ENCOUNTER — Other Ambulatory Visit: Payer: Self-pay

## 2019-02-26 ENCOUNTER — Ambulatory Visit (INDEPENDENT_AMBULATORY_CARE_PROVIDER_SITE_OTHER): Payer: Medicare Other | Admitting: Pharmacist Clinician (PhC)/ Clinical Pharmacy Specialist

## 2019-02-26 DIAGNOSIS — I4891 Unspecified atrial fibrillation: Secondary | ICD-10-CM

## 2019-02-26 DIAGNOSIS — Z7901 Long term (current) use of anticoagulants: Secondary | ICD-10-CM

## 2019-02-26 LAB — POCT INR: INR: 3.5 — AB (ref 2.0–3.0)

## 2019-02-27 ENCOUNTER — Encounter: Payer: Self-pay | Admitting: Cardiovascular Disease

## 2019-02-27 ENCOUNTER — Telehealth (INDEPENDENT_AMBULATORY_CARE_PROVIDER_SITE_OTHER): Payer: Medicare Other | Admitting: Cardiovascular Disease

## 2019-02-27 DIAGNOSIS — E7800 Pure hypercholesterolemia, unspecified: Secondary | ICD-10-CM

## 2019-02-27 DIAGNOSIS — Z7901 Long term (current) use of anticoagulants: Secondary | ICD-10-CM

## 2019-02-27 DIAGNOSIS — I2510 Atherosclerotic heart disease of native coronary artery without angina pectoris: Secondary | ICD-10-CM

## 2019-02-27 MED ORDER — FLUTICASONE PROPIONATE 50 MCG/ACT NA SUSP: 2.0000 | Freq: Every day | NASAL | 1 refills | Status: DC | PRN

## 2019-02-27 NOTE — Patient Instructions (Addendum)
Medication Instructions:  FLUTICASONE NASAL SPRAY 2 SPRAYS EACH NOSTRIL DAILY AS NEEDED FOR ALLERGIES    If you need a refill on your cardiac medications before your next appointment, please call your pharmacy.   Lab work: FASTING LP/CMET SOON  If you have labs (blood work) drawn today and your tests are completely normal, you will receive your results only by: Marland Kitchen MyChart Message (if you have MyChart) OR . A paper copy in the mail If you have any lab test that is abnormal or we need to change your treatment, we will call you to review the results.  Testing/Procedures: NONE  Follow-Up: At [facility], you and your health needs are our priority.  As part of our continuing mission to provide you with exceptional heart care, we have created designated Provider Care Teams.  These Care Teams include your primary Cardiologist (physician) and Advanced Practice Providers (APPs -  Physician Assistants and Nurse Practitioners) who all work together to provide you with the care you need, when you need it. You will need a follow up appointment in 6 months.  Please call our office 2 months in advance to schedule this appointment.  You may see Skeet Latch, MD or one of the following Advanced Practice Providers on your designated Care Team:   Kerin Ransom, PA-C Roby Lofts, [place] . Sande Rives, PA-C  Any Other Special Instructions Will Be Listed Below (If Applicable).  Please consider trying bempedoic acid (Nexletol) for your high cholesterol

## 2019-02-27 NOTE — Progress Notes (Signed)
Virtual Visit via Video Note   This visit type was conducted due to national recommendations for restrictions regarding the COVID-19 Pandemic (e.g. social distancing) in an effort to limit this patient's exposure and mitigate transmission in our community.  Due to her co-morbid illnesses, this patient is at least at moderate risk for complications without adequate follow up.  This format is felt to be most appropriate for this patient at this time.  All issues noted in this document were discussed and addressed.  A limited physical exam was performed with this format.  Please refer to the patient's chart for her consent to telehealth for [facility].   Date:  02/27/2019   ID:  Danielle Payne, DOB [birth year], MRN [id number]  Patient Location: Home Provider Location: Home  PCP:  Luetta Nutting, DO  Cardiologist:  Skeet Latch, MD  Electrophysiologist:  None   Evaluation Performed:  Follow-Up Visit  Chief Complaint:  CAD  History of Present Illness:    Arneta Mahmood is a 73 y.o. female with CAD s/p CABG (LIMA-->LAD, SVG-->OM, SVG-->PDA), chronic systolic and diastolic heart failure (systolic dysfunction resolved), hypertension, persistent atrial fibrillation, diabetes, and asthma here for follow up.  She was initially seen 05/2018 to establish care.  She was first diagnosed with atrial fibrillation in 2010 when she presented with afib with RVR.  She had an NSTEMI and had a Promus DES to the LCx.  She had a recurrent episode of symptomatic atrial fibrillation 03/2011.  She also required DCCV with Dr. Nadyne Coombes 12/2014.  She presented with recurrent NSTEMI  09/2016 and underwent 3 vessel CABG 09/25/16.  That surgery was complicated by recurrent pleural effusions that required thoracentesis.  Echo at that time revealed LVEF 35-40%.  Since that time she has felt well.  She has occasional heart fluttering that is not sustained.  When she moved back from [place] she decided to re-establish with [place].  Ms. Heidel had an echo 05/2018 that revealed an improvement in her LVEF 65 to 70%.    At her last appointment Ms. Crespi was doing well. She was not willing to take statins because she is afraid of potential harm.  Since her last appointment she was admitted 11/2018 with influenza.  This was complicated by atrial fibrillation with rapid ventricular response.  Her heart rate improved with control of her respiratory symptoms. Lately she has been feeling well.  She isn't exercising as much lately due to COVID-19.  She denies chest pain or shortness of breath.  She also hasn't experienced any lower extremity edema, orthopnea or PND.  Her two complaints are that her voice has changed since she had surgery several years ago.  She is no longer able to sing.  She also notes that her allergies have been bothering her.  She has a lot of post nasal drip at night.   The patient does not have symptoms concerning for COVID-19 infection (fever, chills, cough, or new shortness of breath).    Past Medical History:  Diagnosis Date   Allergy    Asthma    Atrial fibrillation ([place])    Chronic combined systolic and diastolic heart failure ([place]) 05/14/2018   Coronary artery disease    Hypertension    Pure hypercholesterolemia 05/14/2018   Stented coronary artery    Past Surgical History:  Procedure Laterality Date   ABDOMINAL HYSTERECTOMY     CARDIAC CATHETERIZATION     CARDIOVERSION N/A 12/21/2014   Procedure: CARDIOVERSION;  Surgeon: Adrian Prows, MD;  Location: [facility];  Service: Cardiovascular;  Laterality: N/A;   CORONARY ANGIOPLASTY     CORONARY ARTERY BYPASS GRAFT       Current Meds  Medication Sig   albuterol (PROVENTIL HFA;VENTOLIN HFA) 108 (90 Base) MCG/ACT inhaler INHALE TWO PUFFS INTO THE LUNGS EVERY 4 HOURS AS NEEDED FOR WHEEZING OR SHORTNESS OF BREATH   carvedilol (COREG) 25 MG tablet Take 1 tablet (25 mg total) by mouth 2 (two) times daily with a meal.   diltiazem  (CARDIZEM CD) 180 MG 24 hr capsule Take 1 capsule (180 mg total) by mouth daily.   furosemide (LASIX) 40 MG tablet Take 40 mg by mouth as needed (for swelling).   Loratadine (CLARITIN PO) Take 10 mg by mouth daily.    Melatonin 3 MG CAPS Take by mouth at bedtime.   warfarin (COUMADIN) 5 MG tablet Take 1/2 to 1 tablets daily as directed by coumadin clinic (Patient taking differently: Take 2.5-5 mg by mouth as directed. Take 2.5MG  (1/2 tablet)  Monday, wednesday, Friday and 5 MG (1 tablet) daily on Sunday, Tuesday, Thursday and Sunday as directed by coumadin clinic)     Allergies:   Patient has no known allergies.   Social History   Tobacco Use   Smoking status: Never Smoker   Smokeless tobacco: Never Used  Substance Use Topics   Alcohol use: No   Drug use: No     Family Hx: The patient's family history includes CAD in her maternal aunt and mother; Diabetes Mellitus II in her brother and sister; Hypertension in her mother; Prostate cancer in her father; Stroke in her mother.  ROS:   Please see the history of present illness.     All other systems reviewed and are negative.   Prior CV studies:   The following studies were reviewed today:  [place] 11/12/08: 90% mLCx.  X/p 3.0 28 mm Promus DES to mid Lcx.  LVEF 68^ Stress echo 09/01/2009: 7 min 16s on [place] protocol.  LVEF 60-65%.  No ischemia. Echo 12/2009: LVEF 65-70%.  Moderate LVH.  Mild LVOT obstruction. Echo 04/03/11: LVEF 65-70%.  Moderate LVH.  RVSP 20 mmHg  Echo 05/13/18: Study Conclusions  - Left ventricle: The cavity size was normal. Wall thickness was increased in a pattern of mild LVH. Systolic function was vigorous. The estimated ejection fraction was in the range of 65% to 70%. Wall motion was normal; there were no regional wall motion abnormalities. - Mitral valve: Calcified annulus. There was trivial regurgitation. - Right ventricle: The cavity size was mildly dilated. Wall thickness was normal. -  Right atrium: The atrium was mildly dilated.  Labs/Other Tests and Data Reviewed:    EKG:  An ECG dated 11/20/18 was personally reviewed today and demonstrated:  atrial fibrillation.  Rate 157 bpm.  Non-specific ST changes  Recent Labs: 05/15/2018: TSH 1.370 11/21/2018: ALT 20; Magnesium 2.0 11/27/2018: BUN 22; Creatinine, Ser 1.14; Potassium 3.9; Sodium 140 12/16/2018: Hemoglobin 14.4; Platelets 263.0   Recent Lipid Panel Lab Results  Component Value Date/Time   CHOL 203 (H) 05/15/2018 08:27 AM   TRIG 206 (H) 05/15/2018 08:27 AM   HDL 37 (L) 05/15/2018 08:27 AM   CHOLHDL 5.5 (H) 05/15/2018 08:27 AM   LDLCALC 125 (H) 05/15/2018 08:27 AM    Wt Readings from Last 3 Encounters:  11/27/18 193 lb (87.5 kg)  11/21/18 193 lb 12.6 oz (87.9 kg)  08/14/18 196 lb 6.4 oz (89.1 kg)     Objective:    BP  128/77    Pulse 80    Ht 5\' 3"  (1.6 m)    BMI 34.19 kg/m  GENERAL: Well-appearing.  No acute distress. HEENT: Pupils equal round.  Oral mucosa unremarkable NECK:  No jugular venous distention, no visible thyromegaly EXT:  No edema, no cyanosis no clubbing SKIN:  No rashes no nodules NEURO:  Speech fluent.  Cranial nerves grossly intact.  Moves all 4 extremities freely PSYCH:  Cognitively intact, oriented to person place and time   ASSESSMENT & PLAN:    # Chronic systolic and diastolic heart failure: Ms. Donley is doing well and has no heart failure symptoms.  She had an echo at the time of her NSTEMI at which time her LVEF was 35-40%.  It has improved to 65-70% 05/2018.  Continue carvedilol  # CAD s/p NSTEMI/PCI/CABG: # Hyperlipidemia: No recent symptoms.  She continues to refuse statins. She has been working on diet and wants to recheck her levels.  If elevated she will consider bempedoic acid.  # Essential hypertension: Blood pressure well-controlled.  Continue carvedilol and diltiazem.  # Longstanding persistent atrial fibrillation:  Rate well-controlled.  Continue diltiazem,  carvedilol and warfarin.    # Allergic rhinitis: Will add fluticasone.  Continue loratadine.  # Voice changes: Recommend treatment of allergies as above.  Consider ENT referral with PCP.  COVID-19 Education: The signs and symptoms of COVID-19 were discussed with the patient and how to seek care for testing (follow up with PCP or arrange E-visit).  The importance of social distancing was discussed today.  Time:   Today, I have spent 22 minutes with the patient with telehealth technology discussing the above problems.     Medication Adjustments/Labs and Tests Ordered: Current medicines are reviewed at length with the patient today.  Concerns regarding medicines are outlined above.   Tests Ordered: Orders Placed This Encounter  Procedures   Lipid panel   Comprehensive metabolic panel    Medication Changes: Meds ordered this encounter  Medications   fluticasone (FLONASE) 50 MCG/ACT nasal spray    Sig: Place 2 sprays into both nostrils daily as needed for allergies or rhinitis.    Dispense:  15.8 g    Refill:  1    Disposition:  Follow up in 6 month(s)  Signed, Skeet Latch, MD  02/27/2019 9:18 AM    [place]

## 2019-03-03 DIAGNOSIS — M9901 Segmental and somatic dysfunction of cervical region: Secondary | ICD-10-CM | POA: Diagnosis not present

## 2019-03-03 DIAGNOSIS — M9902 Segmental and somatic dysfunction of thoracic region: Secondary | ICD-10-CM | POA: Diagnosis not present

## 2019-03-03 DIAGNOSIS — M542 Cervicalgia: Secondary | ICD-10-CM | POA: Diagnosis not present

## 2019-03-03 DIAGNOSIS — M47812 Spondylosis without myelopathy or radiculopathy, cervical region: Secondary | ICD-10-CM | POA: Diagnosis not present

## 2019-03-09 ENCOUNTER — Encounter: Payer: Self-pay | Admitting: Family Medicine

## 2019-03-09 ENCOUNTER — Encounter: Payer: Medicare Other | Admitting: Family Medicine

## 2019-03-09 ENCOUNTER — Ambulatory Visit (INDEPENDENT_AMBULATORY_CARE_PROVIDER_SITE_OTHER): Payer: Medicare Other | Admitting: Family Medicine

## 2019-03-09 VITALS — BP 160/100 | HR 80 | Temp 98.0°F | Resp 22 | Ht 63.0 in | Wt 203.0 lb

## 2019-03-09 DIAGNOSIS — R1030 Lower abdominal pain, unspecified: Secondary | ICD-10-CM

## 2019-03-09 DIAGNOSIS — L0390 Cellulitis, unspecified: Secondary | ICD-10-CM

## 2019-03-09 DIAGNOSIS — R490 Dysphonia: Secondary | ICD-10-CM

## 2019-03-09 DIAGNOSIS — R109 Unspecified abdominal pain: Secondary | ICD-10-CM | POA: Diagnosis not present

## 2019-03-09 DIAGNOSIS — E7800 Pure hypercholesterolemia, unspecified: Secondary | ICD-10-CM | POA: Diagnosis not present

## 2019-03-09 MED ORDER — MUPIROCIN 2 % EX OINT: 1.0000 "application " | TOPICAL_OINTMENT | Freq: Two times a day (BID) | CUTANEOUS | 0 refills | Status: DC

## 2019-03-10 ENCOUNTER — Encounter: Payer: Self-pay | Admitting: Family Medicine

## 2019-03-10 DIAGNOSIS — L0390 Cellulitis, unspecified: Secondary | ICD-10-CM | POA: Insufficient documentation

## 2019-03-10 DIAGNOSIS — R490 Dysphonia: Secondary | ICD-10-CM | POA: Insufficient documentation

## 2019-03-10 DIAGNOSIS — R109 Unspecified abdominal pain: Secondary | ICD-10-CM | POA: Insufficient documentation

## 2019-03-10 LAB — COMPREHENSIVE METABOLIC PANEL
ALT: 26 IU/L (ref 0–32)
AST: 20 IU/L (ref 0–40)
Albumin/Globulin Ratio: 1.3 (ref 1.2–2.2)
Albumin: 4.1 g/dL (ref 3.7–4.7)
Alkaline Phosphatase: 86 IU/L (ref 39–117)
BUN/Creatinine Ratio: 17 (ref 12–28)
BUN: 15 mg/dL (ref 8–27)
Bilirubin Total: 0.5 mg/dL (ref 0.0–1.2)
CO2: 22 mmol/L (ref 20–29)
Calcium: 9.4 mg/dL (ref 8.7–10.3)
Chloride: 102 mmol/L (ref 96–106)
Creatinine, Ser: 0.87 mg/dL (ref 0.57–1.00)
GFR calc Af Amer: 76 mL/min/{1.73_m2} (ref >59)
GFR calc non Af Amer: 66 mL/min/{1.73_m2} (ref >59)
Globulin, Total: 3.2 g/dL (ref 1.5–4.5)
Glucose: 180 mg/dL — ABNORMAL HIGH (ref 65–99)
Potassium: 4.3 mmol/L (ref 3.5–5.2)
Sodium: 141 mmol/L (ref 134–144)
Total Protein: 7.3 g/dL (ref 6.0–8.5)

## 2019-03-10 LAB — LIPID PANEL
Chol/HDL Ratio: 6.4 ratio — ABNORMAL HIGH (ref 0.0–4.4)
Cholesterol, Total: 216 mg/dL — ABNORMAL HIGH (ref 100–199)
HDL: 34 mg/dL — ABNORMAL LOW (ref >39)
LDL Calculated: 130 mg/dL — ABNORMAL HIGH (ref 0–99)
Triglycerides: 262 mg/dL — ABNORMAL HIGH (ref 0–149)
VLDL Cholesterol Cal: 52 mg/dL — ABNORMAL HIGH (ref 5–40)

## 2019-03-10 NOTE — Progress Notes (Signed)
This encounter was created in error - please disregard.

## 2019-03-10 NOTE — Assessment & Plan Note (Signed)
-  Small area on back, I think this should resolve with topical mupirocin.

## 2019-03-10 NOTE — Assessment & Plan Note (Addendum)
-  Unclear etiology, reports she is UTD on colonoscopy.  Abdominal ultrasound ordered for further evaluation.

## 2019-03-10 NOTE — Assessment & Plan Note (Signed)
-  Bothersome for her, especially with singing. -Referral made to ENT

## 2019-03-10 NOTE — Progress Notes (Signed)
Danielle Payne - 73 y.o. female MRN [id number]  Date of birth: [birth year]  Subjective Chief Complaint  Patient presents with  . Rash    on waistline, raised/angry/scaley    HPI Danielle Payne is a 73 y.o. female here today with complaint of rash.  She also has concerns of abdominal pain and voice changes.    -Rash:  Noticed reddened area along posterior waistline a few days ago.  Mildly painful.  Denies itching.  Has tried lotions without improvement.  She denies swelling or drainage.    -Abdominal pain:  Reports abdominal pain, unable to localize but more so along the lower abdomen.  Has had issues with constipation in the past but does well with metamucil.  Denies constipation at this time and typically has 2 soft BM/day.  She does feel bloated often.  She denies nausea, early satiety, bloody/dark stools.  She is up to date on colon cancer screening with last screening completed in [place].   -Voice change:  Reports that since CABG she has had changes to her voice, especially if singing.  She feels like she has to really strain to accomplish what she could easily do prior to surgery.  She denies pain or difficulty swallowing. Would like to see ENT.    ROS:  A comprehensive ROS was completed and negative except as noted per HPI  No Known Allergies  Past Medical History:  Diagnosis Date  . Allergy   . Asthma   . Atrial fibrillation ([place])   . Chronic combined systolic and diastolic heart failure ([place]) 05/14/2018  . Coronary artery disease   . Hypertension   . Pure hypercholesterolemia 05/14/2018  . Stented coronary artery     Past Surgical History:  Procedure Laterality Date  . ABDOMINAL HYSTERECTOMY    . CARDIAC CATHETERIZATION    . CARDIOVERSION N/A 12/21/2014   Procedure: CARDIOVERSION;  Surgeon: Adrian Prows, MD;  Location: [facility];  Service: Cardiovascular;  Laterality: N/A;  . CORONARY ANGIOPLASTY    . CORONARY ARTERY BYPASS GRAFT      Social History    Socioeconomic History  . Marital status: Married    Spouse name: Not on file  . Number of children: Not on file  . Years of education: Not on file  . Highest education level: Not on file  Occupational History  . Not on file  Social Needs  . Financial resource strain: Not on file  . Food insecurity:    Worry: Not on file    Inability: Not on file  . Transportation needs:    Medical: Not on file    Non-medical: Not on file  Tobacco Use  . Smoking status: Never Smoker  . Smokeless tobacco: Never Used  Substance and Sexual Activity  . Alcohol use: No  . Drug use: No  . Sexual activity: Not on file  Lifestyle  . Physical activity:    Days per week: Not on file    Minutes per session: Not on file  . Stress: Not on file  Relationships  . Social connections:    Talks on phone: Not on file    Gets together: Not on file    Attends religious service: Not on file    Active member of club or organization: Not on file    Attends meetings of clubs or organizations: Not on file    Relationship status: Not on file  Other Topics Concern  . Not on file  Social History Narrative  . Not  on file    Family History  Problem Relation Age of Onset  . CAD Mother   . Stroke Mother   . Hypertension Mother   . Prostate cancer Father   . Diabetes Mellitus II Sister   . Diabetes Mellitus II Brother   . CAD Maternal Aunt     Health Maintenance  Topic Date Due  . Hepatitis C Screening  March 24, 1946  . MAMMOGRAM  07/24/1994  . COLONOSCOPY  07/24/1994  . DEXA SCAN  07/24/2009  . PNA vac Low Risk Adult (1 of 2 - PCV13) 07/24/2009  . TETANUS/TDAP  07/07/2018  . INFLUENZA VACCINE  05/09/2019    ----------------------------------------------------------------------------------------------------------------------------------------------------------------------------------------------------------------- Physical Exam BP (!) 160/100   Pulse 80   Temp 98 F (36.7 C) (Oral)   Resp (!) 22    Ht 5\' 3"  (1.6 m)   Wt 203 lb (92.1 kg)   SpO2 98%   BMI 35.96 kg/m   Physical Exam Constitutional:      Appearance: Normal appearance.  HENT:     Head: Normocephalic and atraumatic.     Mouth/Throat:     Mouth: Mucous membranes are moist.  Neck:     Musculoskeletal: Neck supple.  Cardiovascular:     Rate and Rhythm: Normal rate and regular rhythm.  Pulmonary:     Effort: Pulmonary effort is normal.     Breath sounds: Normal breath sounds.  Abdominal:     Comments: Mild distention, mild ttp in RLQ.  No rebound  Skin:    General: Skin is warm.     Comments: Erythematous lesion on posterior waistline area of back.  No induration or fluctuance.    Neurological:     General: No focal deficit present.     Mental Status: She is alert.     ------------------------------------------------------------------------------------------------------------------------------------------------------------------------------------------------------------------- Assessment and Plan  Abdominal pain -Unclear etiology, reports she is UTD on colonoscopy.  Abdominal ultrasound ordered for further evaluation.    Cellulitis -Small area on back, I think this should resolve with topical mupirocin.    Dysphonia -Bothersome for her, especially with singing. -Referral made to ENT

## 2019-03-16 ENCOUNTER — Telehealth: Payer: Self-pay | Admitting: Family Medicine

## 2019-03-16 NOTE — Telephone Encounter (Signed)
Copied from [place] [phone]. Topic: Quick Communication - See Telephone Encounter >> Mar 16, 2019 12:01 PM [name] wrote: CRM for notification. See Telephone encounter for: 03/16/19.  Patient is calling check on the status of her referral for abdomen. Thank you

## 2019-03-17 NOTE — Telephone Encounter (Signed)
Referral in progress. Updated 03/16/2019

## 2019-03-18 ENCOUNTER — Telehealth: Payer: Self-pay

## 2019-03-18 NOTE — Telephone Encounter (Signed)
lmom for prescreen  

## 2019-03-19 ENCOUNTER — Telehealth: Payer: Self-pay | Admitting: Pharmacist

## 2019-03-19 DIAGNOSIS — M542 Cervicalgia: Secondary | ICD-10-CM | POA: Diagnosis not present

## 2019-03-19 DIAGNOSIS — M47812 Spondylosis without myelopathy or radiculopathy, cervical region: Secondary | ICD-10-CM | POA: Diagnosis not present

## 2019-03-19 DIAGNOSIS — M9901 Segmental and somatic dysfunction of cervical region: Secondary | ICD-10-CM | POA: Diagnosis not present

## 2019-03-19 DIAGNOSIS — M9902 Segmental and somatic dysfunction of thoracic region: Secondary | ICD-10-CM | POA: Diagnosis not present

## 2019-03-19 NOTE — Telephone Encounter (Signed)
Patient to interested in discussing Repatha/Praluent or Nexletol at this time.  We will provide written information on Repatha and Nexletol for patient to assess. She is to let us know if okay to process during next INR check.

## 2019-03-19 NOTE — Telephone Encounter (Signed)

## 2019-03-20 ENCOUNTER — Telehealth: Payer: Self-pay | Admitting: Family Medicine

## 2019-03-20 ENCOUNTER — Other Ambulatory Visit: Payer: Self-pay | Admitting: Family Medicine

## 2019-03-20 DIAGNOSIS — R1030 Lower abdominal pain, unspecified: Secondary | ICD-10-CM

## 2019-03-20 NOTE — Progress Notes (Signed)
pelvi

## 2019-03-20 NOTE — Telephone Encounter (Signed)
Called Sarah @ [place] . LAM about added Pelvic US.

## 2019-03-20 NOTE — Telephone Encounter (Signed)
Pelvic US added as well.

## 2019-03-20 NOTE — Telephone Encounter (Signed)
Copied from [facility] [id number]. Topic: Quick Communication - See Telephone Encounter >> Mar 20, 2019  8:13 AM Margot Ables wrote: CRM for notification. See Telephone encounter for: 03/20/19. Sarah w/[facility] Direct # [phone]  Judson Roch noted that abdominal US was ordered for lower abdominal pain. She said that the abdominal US only looks at the belly button up so she is wanting to clarify what Dr. Zigmund Daniel is trying to rule out and if DX needs to be updated or different order placed. Please call.

## 2019-03-23 ENCOUNTER — Other Ambulatory Visit: Payer: Medicare Other

## 2019-03-23 DIAGNOSIS — R1030 Lower abdominal pain, unspecified: Secondary | ICD-10-CM

## 2019-03-23 DIAGNOSIS — R300 Dysuria: Secondary | ICD-10-CM

## 2019-03-23 NOTE — Addendum Note (Signed)
Addended by: Lynnea Ferrier on: 03/23/2019 04:18 PM   Modules accepted: Orders

## 2019-03-23 NOTE — Addendum Note (Signed)
Addended by: Kathlen Brunswick on: 03/23/2019 04:17 PM   Modules accepted: Orders

## 2019-03-23 NOTE — Addendum Note (Signed)
Addended by: Lynnea Ferrier on: 03/23/2019 03:28 PM   Modules accepted: Orders

## 2019-03-24 ENCOUNTER — Encounter: Payer: Self-pay | Admitting: Family Medicine

## 2019-03-24 ENCOUNTER — Ambulatory Visit (INDEPENDENT_AMBULATORY_CARE_PROVIDER_SITE_OTHER): Payer: Medicare Other | Admitting: Family Medicine

## 2019-03-24 VITALS — BP 159/98 | HR 77 | Temp 97.8°F | Ht 63.0 in | Wt 195.0 lb

## 2019-03-24 DIAGNOSIS — R300 Dysuria: Secondary | ICD-10-CM

## 2019-03-24 DIAGNOSIS — R1030 Lower abdominal pain, unspecified: Secondary | ICD-10-CM

## 2019-03-24 LAB — URINALYSIS, ROUTINE W REFLEX MICROSCOPIC
Bilirubin Urine: NEGATIVE
Glucose, UA: NEGATIVE
Hgb urine dipstick: NEGATIVE
Ketones, ur: NEGATIVE
Leukocytes,Ua: NEGATIVE
Nitrite: NEGATIVE
Protein, ur: NEGATIVE
Specific Gravity, Urine: 1.007 (ref 1.001–1.03)
pH: 6.5 (ref 5.0–8.0)

## 2019-03-24 NOTE — Assessment & Plan Note (Signed)
-  Pain in abdomen worsening since last visit. Scheduled for [place] in two weeks however discussed with her that I would like to see and examine her in office and is scheduled for tomorrow at 10am.

## 2019-03-24 NOTE — Progress Notes (Signed)
Danielle Payne - 73 y.o. female MRN [id number]  Date of birth: Dec 17, 1945   This visit type was conducted due to national recommendations for restrictions regarding the COVID-19 Pandemic (e.g. social distancing).  This format is felt to be most appropriate for this patient at this time.  All issues noted in this document were discussed and addressed.  No physical exam was performed (except for noted visual exam findings with Video Visits).  I discussed the limitations of evaluation and management by telemedicine and the availability of in person appointments. The patient expressed understanding and agreed to proceed.  I connected with@ on 03/24/19 at  8:30 AM EDT by a video enabled telemedicine application and verified that I am speaking with the correct person using two identifiers.  Interactive audio and video telecommunications were attempted between this provider and patient, however failed, due to patient having technical difficulties OR patient did not have access to video capability.  We continued and completed visit with audio only.     Patient Location: Home [address] [place] [address]   Provider location:   Home office  Chief Complaint  Patient presents with   Urinary Tract Infection    lower ABD pain/IBS flare up ? onset :10 days ago. on-going    HPI  Danielle Payne is a 73 y.o. female who presents via audio/video conferencing for a telehealth visit today.  She reports increasing abdominal pain over the past few months.  Located mostly around the umbilicus with radiation throughout the entire abdomen and pelvic area.  Has history or IBS so thought this may be related to that.  She had UA yesterday that was normal.  She denies urinary frequency or dysuria.  She is scheduled for [place] in a couple of weeks for the pain she was having previously. She denies changes to BM, including more constipation or diarrhea.  She does feel more bloated at times. She denies bloody or  dark stools.  Her appetite is normal and she denies nausea.  She hasn't really noticed any particular foods that make this worse.     ROS:  A comprehensive ROS was completed and negative except as noted per HPI  Past Medical History:  Diagnosis Date   Allergy    Asthma    Atrial fibrillation (HCC)    Chronic combined systolic and diastolic heart failure ([place]) 05/14/2018   Coronary artery disease    Hypertension    Pure hypercholesterolemia 05/14/2018   Stented coronary artery     Past Surgical History:  Procedure Laterality Date   ABDOMINAL HYSTERECTOMY     CARDIAC CATHETERIZATION     CARDIOVERSION N/A 12/21/2014   Procedure: CARDIOVERSION;  Surgeon: Adrian Prows, MD;  Location: [facility] ENDOSCOPY;  Service: Cardiovascular;  Laterality: N/A;   CORONARY ANGIOPLASTY     CORONARY ARTERY BYPASS GRAFT      Family History  Problem Relation Age of Onset   CAD Mother    Stroke Mother    Hypertension Mother    Prostate cancer Father    Diabetes Mellitus II Sister    Diabetes Mellitus II Brother    CAD Maternal Aunt     Social History   Socioeconomic History   Marital status: Married    Spouse name: Not on file   Number of children: Not on file   Years of education: Not on file   Highest education level: Not on file  Occupational History   Not on file  Social Needs   Financial resource  strain: Not on file   Food insecurity    Worry: Not on file    Inability: Not on file   Transportation needs    Medical: Not on file    Non-medical: Not on file  Tobacco Use   Smoking status: Never Smoker   Smokeless tobacco: Never Used  Substance and Sexual Activity   Alcohol use: No   Drug use: No   Sexual activity: Not on file  Lifestyle   Physical activity    Days per week: Not on file    Minutes per session: Not on file   Stress: Not on file  Relationships   Social connections    Talks on phone: Not on file    Gets together: Not on file    Attends  religious service: Not on file    Active member of club or organization: Not on file    Attends meetings of clubs or organizations: Not on file    Relationship status: Not on file   Intimate partner violence    Fear of current or ex partner: Not on file    Emotionally abused: Not on file    Physically abused: Not on file    Forced sexual activity: Not on file  Other Topics Concern   Not on file  Social History Narrative   Not on file     Current Outpatient Medications:    albuterol (PROVENTIL HFA;VENTOLIN HFA) 108 (90 Base) MCG/ACT inhaler, INHALE TWO PUFFS INTO THE LUNGS EVERY 4 HOURS AS NEEDED FOR WHEEZING OR SHORTNESS OF BREATH, Disp: 18 g, Rfl: 0   carvedilol (COREG) 25 MG tablet, Take 1 tablet (25 mg total) by mouth 2 (two) times daily with a meal., Disp: 180 tablet, Rfl: 2   diltiazem (CARDIZEM CD) 180 MG 24 hr capsule, Take 1 capsule (180 mg total) by mouth daily., Disp: 90 capsule, Rfl: 1   fluticasone (FLONASE) 50 MCG/ACT nasal spray, Place 2 sprays into both nostrils daily as needed for allergies or rhinitis., Disp: 15.8 g, Rfl: 1   furosemide (LASIX) 40 MG tablet, Take 40 mg by mouth as needed (for swelling)., Disp: , Rfl:    Loratadine (CLARITIN PO), Take 10 mg by mouth daily. , Disp: , Rfl:    Melatonin 3 MG CAPS, Take by mouth at bedtime., Disp: , Rfl:    mupirocin ointment (BACTROBAN) 2 %, Place 1 application into the nose 2 (two) times daily., Disp: 22 g, Rfl: 0   warfarin (COUMADIN) 5 MG tablet, Take 1/2 to 1 tablets daily as directed by coumadin clinic (Patient taking differently: Take 2.5-5 mg by mouth as directed. Take 2.5MG  (1/2 tablet)  Monday, wednesday, Friday and 5 MG (1 tablet) daily on Sunday, Tuesday, Thursday and Sunday as directed by coumadin clinic), Disp: 90 tablet, Rfl: 3  EXAM:  VITALS per patient if applicable: BP (!) 607/37 Comment: BP cuff @ hm   Pulse 77    Temp 97.8 F (36.6 C) (Temporal)    Ht 5\' 3"  (1.6 m)    Wt 195 lb (88.5 kg)  Comment: Pt reports at hm   BMI 34.54 kg/m   GENERAL: alert, oriented, appears well and in no acute distress  HEENT: atraumatic, conjunttiva clear, no obvious abnormalities on inspection of external nose and ears  NECK: normal movements of the head and neck  LUNGS: on inspection no signs of respiratory distress, breathing rate appears normal, no obvious gross SOB, gasping or wheezing  CV: no obvious cyanosis  MS:  moves all visible extremities without noticeable abnormality  PSYCH/NEURO: pleasant and cooperative, no obvious depression or anxiety, speech and thought processing grossly intact  ASSESSMENT AND PLAN:  Discussed the following assessment and plan:  Abdominal pain -Pain in abdomen worsening since last visit. Scheduled for [place] in two weeks however discussed with her that I would like to see and examine her in office and is scheduled for tomorrow at 10am.         I discussed the assessment and treatment plan with the patient. The patient was provided an opportunity to ask questions and all were answered. The patient agreed with the plan and demonstrated an understanding of the instructions.   The patient was advised to call back or seek an in-person evaluation if the symptoms worsen or if the condition fails to improve as anticipated.  I provided 15 minutes of non-face-to-face time during this encounter.   Luetta Nutting, DO

## 2019-03-25 ENCOUNTER — Encounter: Payer: Self-pay | Admitting: Family Medicine

## 2019-03-25 ENCOUNTER — Other Ambulatory Visit: Payer: Self-pay

## 2019-03-25 ENCOUNTER — Ambulatory Visit (INDEPENDENT_AMBULATORY_CARE_PROVIDER_SITE_OTHER): Payer: Medicare Other | Admitting: Family Medicine

## 2019-03-25 ENCOUNTER — Ambulatory Visit (INDEPENDENT_AMBULATORY_CARE_PROVIDER_SITE_OTHER): Payer: Medicare Other | Admitting: Pharmacist

## 2019-03-25 DIAGNOSIS — Z7901 Long term (current) use of anticoagulants: Secondary | ICD-10-CM | POA: Diagnosis not present

## 2019-03-25 DIAGNOSIS — I4891 Unspecified atrial fibrillation: Secondary | ICD-10-CM

## 2019-03-25 DIAGNOSIS — R1030 Lower abdominal pain, unspecified: Secondary | ICD-10-CM

## 2019-03-25 LAB — URINE CULTURE
MICRO NUMBER:: 569702
Result:: NO GROWTH
SPECIMEN QUALITY:: ADEQUATE

## 2019-03-25 LAB — POCT INR: INR: 2.4 (ref 2.0–3.0)

## 2019-03-25 NOTE — Progress Notes (Signed)
Danielle Payne - 73 y.o. female MRN [id number]  Date of birth: 1946-05-08  Subjective Chief Complaint  Patient presents with  . Follow-up    Lower ABD pain, posisble IBS flare up ? Consitpatio, trying ot loos wt, increased exercise & physillum husk intake , Onest : 5 days ago.    HPI Danielle Payne is a 73 y.o. female here for follow up of abdominal pain.  I completed a video visit with her a couple of days ago regarding this as well however she continues to have pain.  At previous visit she denies constipation however today reports she has had increased constipation. She has also started increasing her psyllium fiber just prior to the worsening abdominal pain.  She does not drink much water throughout the day either.  She denies blood in her stool, chest pain, nausea or vomiting, fever, chills.   ROS:  A comprehensive ROS was completed and negative except as noted per HPI  No Known Allergies  Past Medical History:  Diagnosis Date  . Allergy   . Asthma   . Atrial fibrillation ([place])   . Chronic combined systolic and diastolic heart failure ([place]) 05/14/2018  . Coronary artery disease   . Hypertension   . Pure hypercholesterolemia 05/14/2018  . Stented coronary artery     Past Surgical History:  Procedure Laterality Date  . ABDOMINAL HYSTERECTOMY    . CARDIAC CATHETERIZATION    . CARDIOVERSION N/A 12/21/2014   Procedure: CARDIOVERSION;  Surgeon: Adrian Prows, MD;  Location: [facility];  Service: Cardiovascular;  Laterality: N/A;  . CORONARY ANGIOPLASTY    . CORONARY ARTERY BYPASS GRAFT      Social History   Socioeconomic History  . Marital status: Married    Spouse name: Not on file  . Number of children: Not on file  . Years of education: Not on file  . Highest education level: Not on file  Occupational History  . Not on file  Social Needs  . Financial resource strain: Not hard at all  . Food insecurity    Worry: Never true    Inability: Never true  . Transportation  needs    Medical: No    Non-medical: No  Tobacco Use  . Smoking status: Never Smoker  . Smokeless tobacco: Never Used  Substance and Sexual Activity  . Alcohol use: No  . Drug use: No  . Sexual activity: Yes    Birth control/protection: None  Lifestyle  . Physical activity    Days per week: Not on file    Minutes per session: Not on file  . Stress: Only a little  Relationships  . Social Herbalist on phone: Three times a week    Gets together: Three times a week    Attends religious service: Not on file    Active member of club or organization: Not on file    Attends meetings of clubs or organizations: Not on file    Relationship status: Married  Other Topics Concern  . Not on file  Social History Narrative  . Not on file    Family History  Problem Relation Age of Onset  . CAD Mother   . Stroke Mother   . Hypertension Mother   . Prostate cancer Father   . Diabetes Mellitus II Sister   . Diabetes Mellitus II Brother   . CAD Maternal Aunt     Health Maintenance  Topic Date Due  . Hepatitis C Screening  1945/11/17  .  MAMMOGRAM  07/24/1994  . COLONOSCOPY  07/24/1994  . DEXA SCAN  07/24/2009  . PNA vac Low Risk Adult (1 of 2 - PCV13) 07/24/2009  . TETANUS/TDAP  07/07/2018  . INFLUENZA VACCINE  05/09/2019    ----------------------------------------------------------------------------------------------------------------------------------------------------------------------------------------------------------------- Physical Exam BP 136/84   Pulse 81   Temp 98.1 F (36.7 C) (Oral)   Resp 20   Ht 5\' 3"  (1.6 m)   Wt 199 lb (90.3 kg)   SpO2 97%   BMI 35.25 kg/m   Physical Exam Constitutional:      Appearance: Normal appearance.  HENT:     Head: Normocephalic and atraumatic.  Eyes:     General: No scleral icterus. Neck:     Musculoskeletal: Neck supple.  Cardiovascular:     Rate and Rhythm: Normal rate and regular rhythm.  Pulmonary:      Effort: Pulmonary effort is normal.     Breath sounds: Normal breath sounds.  Abdominal:     General: Abdomen is flat. There is no distension.     Tenderness: There is abdominal tenderness (diffuse, worse along lower abdomen).  Skin:    General: Skin is warm and dry.  Neurological:     Mental Status: She is alert.  Psychiatric:        Mood and Affect: Mood normal.        Behavior: Behavior normal.     ------------------------------------------------------------------------------------------------------------------------------------------------------------------------------------------------------------------- Assessment and Plan  Abdominal pain -Based on exam and history provided today I think constipation and IBS are more likely causing her discomfort..  She will continue fiber but instructed to be sure and drink plenty of fluid with fiber.  -She would like to proceed with abdominal US as well to evaluate for GB disease and she is concerned about ovarian problems contributing.

## 2019-03-25 NOTE — Assessment & Plan Note (Signed)
-  Based on exam and history provided today I think constipation and IBS are more likely causing her discomfort..  She will continue fiber but instructed to be sure and drink plenty of fluid with fiber.  -She would like to proceed with abdominal US as well to evaluate for GB disease and she is concerned about ovarian problems contributing.

## 2019-04-02 ENCOUNTER — Encounter (HOSPITAL_COMMUNITY): Payer: Self-pay

## 2019-04-02 ENCOUNTER — Inpatient Hospital Stay (HOSPITAL_COMMUNITY)
Admission: EM | Admit: 2019-04-02 | Discharge: 2019-04-05 | DRG: 392 | Disposition: A | Discharge status: Home / Self Care | Payer: Medicare Other | Attending: Internal Medicine | Admitting: Internal Medicine

## 2019-04-02 ENCOUNTER — Emergency Department (HOSPITAL_COMMUNITY): Payer: Medicare Other

## 2019-04-02 ENCOUNTER — Other Ambulatory Visit: Payer: Self-pay

## 2019-04-02 DIAGNOSIS — R7309 Other abnormal glucose: Secondary | ICD-10-CM

## 2019-04-02 DIAGNOSIS — E785 Hyperlipidemia, unspecified: Secondary | ICD-10-CM | POA: Diagnosis present

## 2019-04-02 DIAGNOSIS — I482 Chronic atrial fibrillation, unspecified: Secondary | ICD-10-CM | POA: Diagnosis present

## 2019-04-02 DIAGNOSIS — I110 Hypertensive heart disease with heart failure: Secondary | ICD-10-CM | POA: Diagnosis present

## 2019-04-02 DIAGNOSIS — Z03818 Encounter for observation for suspected exposure to other biological agents ruled out: Secondary | ICD-10-CM | POA: Diagnosis not present

## 2019-04-02 DIAGNOSIS — K5732 Diverticulitis of large intestine without perforation or abscess without bleeding: Secondary | ICD-10-CM | POA: Diagnosis not present

## 2019-04-02 DIAGNOSIS — Z951 Presence of aortocoronary bypass graft: Secondary | ICD-10-CM | POA: Diagnosis not present

## 2019-04-02 DIAGNOSIS — I4891 Unspecified atrial fibrillation: Secondary | ICD-10-CM | POA: Diagnosis present

## 2019-04-02 DIAGNOSIS — I2510 Atherosclerotic heart disease of native coronary artery without angina pectoris: Secondary | ICD-10-CM | POA: Diagnosis not present

## 2019-04-02 DIAGNOSIS — Z8042 Family history of malignant neoplasm of prostate: Secondary | ICD-10-CM

## 2019-04-02 DIAGNOSIS — Z8249 Family history of ischemic heart disease and other diseases of the circulatory system: Secondary | ICD-10-CM | POA: Diagnosis not present

## 2019-04-02 DIAGNOSIS — I5042 Chronic combined systolic (congestive) and diastolic (congestive) heart failure: Secondary | ICD-10-CM | POA: Diagnosis present

## 2019-04-02 DIAGNOSIS — I4821 Permanent atrial fibrillation: Secondary | ICD-10-CM

## 2019-04-02 DIAGNOSIS — Z7901 Long term (current) use of anticoagulants: Secondary | ICD-10-CM | POA: Diagnosis not present

## 2019-04-02 DIAGNOSIS — Z1159 Encounter for screening for other viral diseases: Secondary | ICD-10-CM

## 2019-04-02 DIAGNOSIS — Z823 Family history of stroke: Secondary | ICD-10-CM

## 2019-04-02 DIAGNOSIS — E669 Obesity, unspecified: Secondary | ICD-10-CM | POA: Diagnosis present

## 2019-04-02 DIAGNOSIS — I10 Essential (primary) hypertension: Secondary | ICD-10-CM | POA: Diagnosis present

## 2019-04-02 DIAGNOSIS — K5720 Diverticulitis of large intestine with perforation and abscess without bleeding: Principal | ICD-10-CM | POA: Diagnosis present

## 2019-04-02 DIAGNOSIS — K5730 Diverticulosis of large intestine without perforation or abscess without bleeding: Secondary | ICD-10-CM | POA: Diagnosis not present

## 2019-04-02 DIAGNOSIS — Z7951 Long term (current) use of inhaled steroids: Secondary | ICD-10-CM

## 2019-04-02 DIAGNOSIS — E1165 Type 2 diabetes mellitus with hyperglycemia: Secondary | ICD-10-CM | POA: Diagnosis present

## 2019-04-02 DIAGNOSIS — Z6836 Body mass index (BMI) 36.0-36.9, adult: Secondary | ICD-10-CM

## 2019-04-02 DIAGNOSIS — Z833 Family history of diabetes mellitus: Secondary | ICD-10-CM

## 2019-04-02 DIAGNOSIS — E7800 Pure hypercholesterolemia, unspecified: Secondary | ICD-10-CM | POA: Diagnosis present

## 2019-04-02 DIAGNOSIS — R1084 Generalized abdominal pain: Secondary | ICD-10-CM | POA: Diagnosis not present

## 2019-04-02 DIAGNOSIS — J45909 Unspecified asthma, uncomplicated: Secondary | ICD-10-CM | POA: Diagnosis present

## 2019-04-02 DIAGNOSIS — E119 Type 2 diabetes mellitus without complications: Secondary | ICD-10-CM | POA: Diagnosis not present

## 2019-04-02 LAB — HEMOGLOBIN A1C
Hgb A1c MFr Bld: 7.6 % — ABNORMAL HIGH (ref 4.8–5.6)
Mean Plasma Glucose: 171.42 mg/dL

## 2019-04-02 LAB — URINALYSIS, ROUTINE W REFLEX MICROSCOPIC
Bacteria, UA: NONE SEEN
Bilirubin Urine: NEGATIVE
Glucose, UA: NEGATIVE mg/dL
Hgb urine dipstick: NEGATIVE
Ketones, ur: NEGATIVE mg/dL
Leukocytes,Ua: NEGATIVE
Nitrite: NEGATIVE
Protein, ur: 30 mg/dL — AB
Specific Gravity, Urine: >1.046 — ABNORMAL HIGH (ref 1.005–1.030)
pH: 7 (ref 5.0–8.0)

## 2019-04-02 LAB — CBC
HCT: 42.6 % (ref 36.0–46.0)
HCT: 45.6 % (ref 36.0–46.0)
Hemoglobin: 14.4 g/dL (ref 12.0–15.0)
Hemoglobin: 14.4 g/dL (ref 12.0–15.0)
MCH: 30.4 pg (ref 26.0–34.0)
MCH: 29.2 pg (ref 26.0–34.0)
MCHC: 33.8 g/dL (ref 30.0–36.0)
MCHC: 31.6 g/dL (ref 30.0–36.0)
MCV: 90.1 fL (ref 80.0–100.0)
MCV: 92.5 fL (ref 80.0–100.0)
Platelets: 214 10*3/uL (ref 150–400)
Platelets: 212 10*3/uL (ref 150–400)
RBC: 4.73 MIL/uL (ref 3.87–5.11)
RBC: 4.93 MIL/uL (ref 3.87–5.11)
RDW: 13.6 % (ref 11.5–15.5)
RDW: 13.8 % (ref 11.5–15.5)
WBC: 15.6 10*3/uL — ABNORMAL HIGH (ref 4.0–10.5)
WBC: 16 10*3/uL — ABNORMAL HIGH (ref 4.0–10.5)
nRBC: 0 % (ref 0.0–0.2)
nRBC: 0 % (ref 0.0–0.2)

## 2019-04-02 LAB — COMPREHENSIVE METABOLIC PANEL
ALT: 20 U/L (ref 0–44)
AST: 14 U/L — ABNORMAL LOW (ref 15–41)
Albumin: 3.7 g/dL (ref 3.5–5.0)
Alkaline Phosphatase: 66 U/L (ref 38–126)
Anion gap: 10 (ref 5–15)
BUN: 15 mg/dL (ref 8–23)
CO2: 24 mmol/L (ref 22–32)
Calcium: 8.5 mg/dL — ABNORMAL LOW (ref 8.9–10.3)
Chloride: 103 mmol/L (ref 98–111)
Creatinine, Ser: 0.81 mg/dL (ref 0.44–1.00)
GFR calc Af Amer: >60 mL/min (ref >60)
GFR calc non Af Amer: >60 mL/min (ref >60)
Glucose, Bld: 215 mg/dL — ABNORMAL HIGH (ref 70–99)
Potassium: 3.5 mmol/L (ref 3.5–5.1)
Sodium: 137 mmol/L (ref 135–145)
Total Bilirubin: 1 mg/dL (ref 0.3–1.2)
Total Protein: 7.4 g/dL (ref 6.5–8.1)

## 2019-04-02 LAB — PROTIME-INR
INR: 2.5 — ABNORMAL HIGH (ref 0.8–1.2)
Prothrombin Time: 26.5 seconds — ABNORMAL HIGH (ref 11.4–15.2)

## 2019-04-02 LAB — LIPASE, BLOOD: Lipase: 23 U/L (ref 11–51)

## 2019-04-02 MED ORDER — ALBUTEROL SULFATE (2.5 MG/3ML) 0.083% IN NEBU: 2.5000 mg | INHALATION_SOLUTION | RESPIRATORY_TRACT | Status: DC | PRN

## 2019-04-02 MED ORDER — ACETAMINOPHEN 650 MG RE SUPP: 650.0000 mg | Freq: Four times a day (QID) | RECTAL | Status: DC | PRN

## 2019-04-02 MED ORDER — SODIUM CHLORIDE 0.9% FLUSH
3.0000 mL | Freq: Two times a day (BID) | INTRAVENOUS | Status: DC
  Administered 2019-04-04: 3 mL via INTRAVENOUS

## 2019-04-02 MED ORDER — PIPERACILLIN-TAZOBACTAM 3.375 G IVPB 30 MIN
3.3750 g | Freq: Once | INTRAVENOUS | Status: AC
  Administered 2019-04-02: 3.375 g via INTRAVENOUS
  Filled 2019-04-02: qty 50

## 2019-04-02 MED ORDER — PIPERACILLIN-TAZOBACTAM 3.375 G IVPB
3.3750 g | Freq: Three times a day (TID) | INTRAVENOUS | Status: DC
  Administered 2019-04-02 – 2019-04-05 (×8): 3.375 g via INTRAVENOUS
  Filled 2019-04-02 (×9): qty 50

## 2019-04-02 MED ORDER — HYDROCODONE-ACETAMINOPHEN 5-325 MG PO TABS: 1.0000 | ORAL_TABLET | ORAL | Status: DC | PRN

## 2019-04-02 MED ORDER — SODIUM CHLORIDE (PF) 0.9 % IJ SOLN
INTRAMUSCULAR | Status: AC
  Administered 2019-04-02: 11:00:00 via INTRAVENOUS
  Filled 2019-04-02: qty 50

## 2019-04-02 MED ORDER — PIPERACILLIN-TAZOBACTAM 3.375 G IVPB 30 MIN: 3.3750 g | Freq: Three times a day (TID) | INTRAVENOUS | Status: DC

## 2019-04-02 MED ORDER — CARVEDILOL 25 MG PO TABS
25.0000 mg | ORAL_TABLET | Freq: Two times a day (BID) | ORAL | Status: DC
  Administered 2019-04-02: 25 mg via ORAL
  Filled 2019-04-02 (×3): qty 1

## 2019-04-02 MED ORDER — ALBUTEROL SULFATE HFA 108 (90 BASE) MCG/ACT IN AERS
2.0000 | INHALATION_SPRAY | RESPIRATORY_TRACT | Status: DC | PRN
  Filled 2019-04-02: qty 6.7

## 2019-04-02 MED ORDER — SODIUM CHLORIDE 0.9% FLUSH
3.0000 mL | Freq: Once | INTRAVENOUS | Status: AC
  Administered 2019-04-02: 3 mL via INTRAVENOUS

## 2019-04-02 MED ORDER — ENOXAPARIN SODIUM 40 MG/0.4ML ~~LOC~~ SOLN: 40.0000 mg | SUBCUTANEOUS | Status: DC

## 2019-04-02 MED ORDER — IOHEXOL 300 MG/ML  SOLN
100.0000 mL | Freq: Once | INTRAMUSCULAR | Status: AC | PRN
  Administered 2019-04-02: 100 mL via INTRAVENOUS

## 2019-04-02 MED ORDER — ONDANSETRON HCL 4 MG/2ML IJ SOLN: 4.0000 mg | Freq: Four times a day (QID) | INTRAMUSCULAR | Status: DC | PRN

## 2019-04-02 MED ORDER — DILTIAZEM HCL ER COATED BEADS 180 MG PO CP24
180.0000 mg | ORAL_CAPSULE | Freq: Every day | ORAL | Status: DC
  Administered 2019-04-02 – 2019-04-04 (×3): 180 mg via ORAL
  Filled 2019-04-02 (×3): qty 1

## 2019-04-02 MED ORDER — ONDANSETRON HCL 4 MG PO TABS: 4.0000 mg | ORAL_TABLET | Freq: Four times a day (QID) | ORAL | Status: DC | PRN

## 2019-04-02 MED ORDER — SODIUM CHLORIDE 0.9 % IV SOLN
INTRAVENOUS | Status: DC
  Administered 2019-04-02 – 2019-04-04 (×2): via INTRAVENOUS

## 2019-04-02 MED ORDER — ACETAMINOPHEN 325 MG PO TABS
650.0000 mg | ORAL_TABLET | Freq: Four times a day (QID) | ORAL | Status: DC | PRN
  Administered 2019-04-05: 650 mg via ORAL
  Filled 2019-04-02: qty 2

## 2019-04-02 NOTE — ED Notes (Signed)
Report given to Vickie, RN

## 2019-04-02 NOTE — ED Provider Notes (Signed)
[facility] DEPT Provider Note   CSN: [id number] Arrival date & time: 04/02/19  0900     History   Chief Complaint Chief Complaint  Patient presents with   Abdominal Pain    HPI Danielle Payne is a 73 y.o. female.  HPI   73 year old female with abdominal pain.  Symptom onset several weeks ago.  Worse in the past 2 days.  She seen her PCP for the same and has a scheduled ultrasound for July 8.  The pain is generalized, but worse in the left lower quadrant.  She has not noticed any appreciable exacerbating relieving factors.  No change in bowel movements.  No specific urinary complaints.  Some mild nausea this morning.  No vomiting.  Past Medical History:  Diagnosis Date   Allergy    Asthma    Atrial fibrillation (HCC)    Chronic combined systolic and diastolic heart failure ([place]) 05/14/2018   Coronary artery disease    Hypertension    Pure hypercholesterolemia 05/14/2018   Stented coronary artery     Patient Active Problem List   Diagnosis Date Noted   Abdominal pain 03/10/2019   Cellulitis 03/10/2019   Dysphonia 03/10/2019   Atrial fibrillation with RVR ([place]) 11/20/2018   Influenza A 11/20/2018   Asthma, chronic, unspecified asthma severity, with acute exacerbation 11/20/2018   Elevated glucose 08/11/2018   Chronic combined systolic and diastolic heart failure ([place]) 05/14/2018   Pure hypercholesterolemia 05/14/2018   Long term (current) use of anticoagulants 03/17/2018   Atrial fibrillation ([place]) 11/23/2014   Palpitations 11/23/2014   Essential hypertension 11/23/2014   Asthma, chronic 11/23/2014   CAD (coronary artery disease) 11/23/2014   H/o Lyme disease 03/22/2013    Past Surgical History:  Procedure Laterality Date   ABDOMINAL HYSTERECTOMY     CARDIAC CATHETERIZATION     CARDIOVERSION N/A 12/21/2014   Procedure: CARDIOVERSION;  Surgeon: Adrian Prows, MD;  Location: [facility] ENDOSCOPY;  Service:  Cardiovascular;  Laterality: N/A;   CORONARY ANGIOPLASTY     CORONARY ARTERY BYPASS GRAFT       OB History   No obstetric history on file.      Home Medications    Prior to Admission medications   Medication Sig Start Date End Date Taking? Authorizing Provider  albuterol (PROVENTIL HFA;VENTOLIN HFA) 108 (90 Base) MCG/ACT inhaler INHALE TWO PUFFS INTO THE LUNGS EVERY 4 HOURS AS NEEDED FOR WHEEZING OR SHORTNESS OF BREATH 12/09/18   Luetta Nutting, DO  carvedilol (COREG) 25 MG tablet Take 1 tablet (25 mg total) by mouth 2 (two) times daily with a meal. 11/17/18   Skeet Latch, MD  diltiazem (CARDIZEM CD) 180 MG 24 hr capsule Take 1 capsule (180 mg total) by mouth daily. 10/13/18   Skeet Latch, MD  fluticasone [facility]) 50 MCG/ACT nasal spray Place 2 sprays into both nostrils daily as needed for allergies or rhinitis. 02/27/19   Skeet Latch, MD  furosemide (LASIX) 40 MG tablet Take 40 mg by mouth as needed (for swelling).    [provider]  Loratadine (CLARITIN PO) Take 10 mg by mouth daily.     [provider]  Melatonin 3 MG CAPS Take by mouth at bedtime.    [provider]  mupirocin ointment (BACTROBAN) 2 % Place 1 application into the nose 2 (two) times daily. 03/09/19   Luetta Nutting, DO  warfarin (COUMADIN) 5 MG tablet Take 1/2 to 1 tablets daily as directed by coumadin clinic Patient taking differently: Take 2.5-5  mg by mouth as directed. Take 2.5MG  (1/2 tablet)  Monday, wednesday, Friday and 5 MG (1 tablet) daily on Sunday, Tuesday, Thursday and Sunday as directed by coumadin clinic 06/03/18   Skeet Latch, MD    Family History Family History  Problem Relation Age of Onset   CAD Mother    Stroke Mother    Hypertension Mother    Prostate cancer Father    Diabetes Mellitus II Sister    Diabetes Mellitus II Brother    CAD Maternal Aunt     Social History Social History   Tobacco Use   Smoking status: Never Smoker    Smokeless tobacco: Never Used  Substance Use Topics   Alcohol use: No   Drug use: No     Allergies   Patient has no known allergies.   Review of Systems Review of Systems  All systems reviewed and negative, other than as noted in HPI.  Physical Exam Updated Vital Signs BP (!) 156/77 (BP Location: Left Arm)    Pulse 85    Temp 98.7 F (37.1 C) (Oral)    Resp 18    Ht 5\' 3"  (1.6 m)    Wt 88.5 kg    SpO2 95%    BMI 34.54 kg/m   Physical Exam Vitals signs and nursing note reviewed.  Constitutional:      General: She is not in acute distress.    Appearance: She is well-developed.  HENT:     Head: Normocephalic and atraumatic.  Eyes:     General:        Right eye: No discharge.        Left eye: No discharge.     Conjunctiva/sclera: Conjunctivae normal.  Neck:     Musculoskeletal: Neck supple.  Cardiovascular:     Rate and Rhythm: Normal rate and regular rhythm.     Heart sounds: Normal heart sounds. No murmur. No friction rub. No gallop.   Pulmonary:     Effort: Pulmonary effort is normal. No respiratory distress.     Breath sounds: Normal breath sounds.  Abdominal:     General: There is no distension.     Palpations: Abdomen is soft.     Tenderness: There is abdominal tenderness.     Comments: Tenderness in the left lower quadrant without rebound or guarding.  Musculoskeletal:        General: No tenderness.  Skin:    General: Skin is warm and dry.  Neurological:     Mental Status: She is alert.  Psychiatric:        Behavior: Behavior normal.        Thought Content: Thought content normal.      ED Treatments / Results  Labs (all labs ordered are listed, but only abnormal results are displayed) Labs Reviewed  COMPREHENSIVE METABOLIC PANEL - Abnormal; Notable for the following components:      Result Value   Glucose, Bld 215 (*)    Calcium 8.5 (*)    AST 14 (*)    All other components within normal limits  CBC - Abnormal; Notable for the following  components:   WBC 15.6 (*)    All other components within normal limits  NOVEL CORONAVIRUS, NAA (HOSPITAL ORDER, SEND-OUT TO REF LAB)  LIPASE, BLOOD  URINALYSIS, ROUTINE W REFLEX MICROSCOPIC  PROTIME-INR  CBC    EKG    Radiology Ct Abdomen Pelvis W Contrast  Result Date: 04/02/2019 CLINICAL DATA:  Abdominal pain EXAM: CT ABDOMEN AND  PELVIS WITH CONTRAST TECHNIQUE: Multidetector CT imaging of the abdomen and pelvis was performed using the standard protocol following bolus administration of intravenous contrast. CONTRAST:  123mL OMNIPAQUE IOHEXOL 300 MG/ML  SOLN COMPARISON:  None. FINDINGS: Lower chest: No acute abnormality.  Coronary artery calcifications. Hepatobiliary: No solid liver abnormality is seen. No gallstones, gallbladder wall thickening, or biliary dilatation. Pancreas: Unremarkable. No pancreatic ductal dilatation or surrounding inflammatory changes. Spleen: Normal in size without significant abnormality. Adrenals/Urinary Tract: Adrenal glands are unremarkable. Kidneys are normal, without renal calculi, solid lesion, or hydronephrosis. Bladder is unremarkable. Stomach/Bowel: Stomach is within normal limits. Appendix appears normal. There is severe descending and sigmoid diverticulosis with extensive wall thickening, fat stranding, and inflammatory fluid about the proximal sigmoid. There is a small contained perforation and phlegmon about the posterior proximal sigmoid (series 2, image 56, series 6, image 143). Vascular/Lymphatic: Aortic atherosclerosis. No enlarged abdominal or pelvic lymph nodes. Reproductive: Status post hysterectomy. Other: No abdominal wall hernia. There is a fluid attenuation superficial subcutaneous cyst or nodule of the inframammary right chest (series 2, image 7). No abdominopelvic ascites. Musculoskeletal: No acute or significant osseous findings. IMPRESSION: 1. There is severe descending and sigmoid diverticulosis with extensive wall thickening, fat stranding,  and inflammatory fluid about the proximal sigmoid. There is a small contained perforation and phlegmon about the posterior proximal sigmoid (series 2, image 56, series 6, image 143). Findings are consistent with acute diverticulitis complicated by contained perforation. No evidence of overt perforation or overt abscess formation. 2. There is a fluid attenuation superficial subcutaneous cyst or nodule of the inframammary right chest (series 2, image 7). Correlate with physical examination for boil, inclusion cyst, or other cutaneous finding. Targeted ultrasound may be used to further characterize if desired. 3. Other chronic, incidental, and postoperative findings as detailed above. Electronically Signed   By: Eddie Candle M.D.   On: 04/02/2019 11:11    Procedures Procedures (including critical care time)  Medications Ordered in ED Medications  sodium chloride flush (NS) 0.9 % injection 3 mL (has no administration in time range)     Initial Impression / Assessment and Plan / ED Course  I have reviewed the triage vital signs and the nursing notes.  Pertinent labs & imaging results that were available during my care of the patient were reviewed by me and considered in my medical decision making (see chart for details).  73 year old female with several week history of abdominal pain which is progressively worsening.  She is focally tender in the left lower quadrant although she describes her pain as more diffuse.  Will obtain labs, UA and CT of the abdomen pelvis.  She is currently declining any pain medication.  CT with diverticulitis with contained perforation. Surgery will see in consultation. Admission to hospitalist service.   Final Clinical Impressions(s) / ED Diagnoses   Final diagnoses:  Generalized abdominal pain  Diverticulitis of large intestine with perforation without bleeding    ED Discharge Orders    None       Virgel Manifold, MD 04/05/19 1248

## 2019-04-02 NOTE — Progress Notes (Addendum)
[facility] Surgery Consult Note  Danielle Payne 08-11-1946  [id number].    Requesting MD: Dr. Wilson Singer  Chief Complaint/Reason for Consult: Diverticulitis with contain perforation  HPI: Danielle Payne is a 73 y.o. female with a history of A. fib on chronic Coumadin (last dose 6/24 PM), CAD status post CABG (2017), hypertension, hyperlipidemia, asthma, CHF (EF 65-70% on last echo-05/26/2018), hyperglycemia and a prior abdominal hysterectomy who presented to the emergency department with lower abdominal pain was found to have diverticulitis with contained perforation a phlegmon of the posterior proximal sigmoid.  Patient reports that approximately 10 days ago she began having some periumbilical abdominal discomfort.  She reports as the weeks progressed her pain migrated to her lower abdomen.  She reports that she thought this was related to constipation as her stools became more firm and hard, although she was still having 2 BMs per day.  She saw her PCP for this on 6/16 and 6/17.  She has a last 24 hours for pain migrated to her left lower quadrant and became more severe.  She notes associated subjective fever as well as chills.  She was unable to sleep through the night secondary to the pain despite taking Tylenol at home.  This prompted her to go to the emergency department.  In the ED she was found to be afebrile, normotensive, without tachycardia tachypnea or hypoxia.  WBC noted to be 15,600. Her CT showed acute sigmoid diverticulitis with a contained perforation without evidence of abscess formation. She was started on IV Zosyn. General surgery was asked to see.   The patient reports a history of diverticulitis in the past, although this was almost 50 years ago.  She reports she is never had a colonoscopy before.  She did have a Cologuard test approximately 4 years ago that she states was negative.  Patient denies any chest pain, shortness of breath, cough.  She denies contacts with anybody with  known COVID.  She does report a recent travel to [place] from the 18th - 23rd.   ROS: Review of Systems  Constitutional: Positive for chills and fever.  Respiratory: Negative for cough, shortness of breath and wheezing.   Cardiovascular: Negative for chest pain.  Gastrointestinal: Positive for abdominal pain and constipation. Negative for blood in stool, diarrhea, melena, nausea and vomiting.  Genitourinary: Negative for dysuria.  All other systems reviewed and are negative.   All systems reviewed and otherwise negative except for as above  Family History  Problem Relation Age of Onset  . CAD Mother   . Stroke Mother   . Hypertension Mother   . Prostate cancer Father   . Diabetes Mellitus II Sister   . Diabetes Mellitus II Brother   . CAD Maternal Aunt     Past Medical History:  Diagnosis Date  . Allergy   . Asthma   . Atrial fibrillation ([place])   . Chronic combined systolic and diastolic heart failure ([place]) 05/14/2018  . Coronary artery disease   . Hypertension   . Pure hypercholesterolemia 05/14/2018  . Stented coronary artery     Past Surgical History:  Procedure Laterality Date  . ABDOMINAL HYSTERECTOMY    . CARDIAC CATHETERIZATION    . CARDIOVERSION N/A 12/21/2014   Procedure: CARDIOVERSION;  Surgeon: Adrian Prows, MD;  Location: [place];  Service: Cardiovascular;  Laterality: N/A;  . CORONARY ANGIOPLASTY    . CORONARY ARTERY BYPASS GRAFT      Social History:  reports that she has never smoked. She has  never used smokeless tobacco. She reports that she does not drink alcohol or use drugs.  Patient states that she lives at home with her husband as well as to grandsons.  She denies any tobacco use.  She denies any alcohol use or illicit drug use.  She normally is able to walk by herself without a cane or walker.  Allergies: No Known Allergies  (Not in a hospital admission)   Prior to Admission medications   Medication Sig Start Date End Date Taking?  Authorizing Provider  albuterol (PROVENTIL HFA;VENTOLIN HFA) 108 (90 Base) MCG/ACT inhaler INHALE TWO PUFFS INTO THE LUNGS EVERY 4 HOURS AS NEEDED FOR WHEEZING OR SHORTNESS OF BREATH Patient taking differently: Inhale 2 puffs into the lungs every 4 (four) hours as needed for wheezing or shortness of breath.  12/09/18  Yes Luetta Nutting, DO  carvedilol (COREG) 25 MG tablet Take 1 tablet (25 mg total) by mouth 2 (two) times daily with a meal. Patient taking differently: Take 12.5-25 mg by mouth See admin instructions. Take 25 mg by mouth in the morning and 12.5 mg in the evening 11/17/18  Yes Skeet Latch, MD  [facility] Liver Oil CAPS Take 1 capsule by mouth daily.   Yes [provider]  diltiazem (CARDIZEM CD) 180 MG 24 hr capsule Take 1 capsule (180 mg total) by mouth daily. Patient taking differently: Take 180 mg by mouth daily. At 4 pm 10/13/18  Yes Skeet Latch, MD  fluticasone [facility]) 50 MCG/ACT nasal spray Place 2 sprays into both nostrils daily as needed for allergies or rhinitis. 02/27/19  Yes Skeet Latch, MD  furosemide (LASIX) 40 MG tablet Take 40 mg by mouth daily as needed for fluid or edema.    Yes [provider]  loratadine (CLARITIN) 10 MG tablet Take 10 mg by mouth at bedtime.   Yes [provider]  Melatonin 3 MG CAPS Take 3 mg by mouth at bedtime.    Yes [provider]  mupirocin ointment (BACTROBAN) 2 % Place 1 application into the nose 2 (two) times daily. 03/09/19  Yes Luetta Nutting, DO  warfarin (COUMADIN) 5 MG tablet Take 1/2 to 1 tablets daily as directed by coumadin clinic Patient taking differently: Take 2.5-5 mg by mouth See admin instructions. Take 2.5 mg by mouth on Monday, Wednesday, Friday and 5 mg on Sunday, Tuesday, Thursday as directed by coumadin clinic 06/03/18  Yes Skeet Latch, MD    Blood pressure 139/85, pulse 75, temperature 98.7 F (37.1 C), temperature source Oral, resp. rate 18, height 5\' 3"  (1.6 m), weight  88.5 kg, SpO2 100 %. Physical Exam: General: pleasant, WD/WN female who is laying in bed in NAD HEENT: head is normocephalic, atraumatic.  Sclera are noninjected.  Pupils equal and round.  Ears and nose without any masses or lesions.  Mouth is pink and moist. Dentition fair Heart: irregular rhythm, regular rate.  No obvious murmurs, gallops, or rubs noted.  Palpable pedal pulses bilaterally Lungs: CTAB, no wheezes, rhonchi, or rales noted.  Respiratory effort nonlabored Abd: Soft, ND, tenderness of the LLQ without r/r/g. No peritonitis. +BS, no masses, hernias, or organomegaly MS: all 4 extremities are symmetrical with no cyanosis, clubbing, or edema. Skin: warm and dry with no masses, lesions, or rashes Psych: A&Ox3 with an appropriate affect. Neuro: cranial nerves grossly intact, extremity CSM intact bilaterally, normal speech  Results for orders placed or performed during the hospital encounter of 04/02/19 (from the past 48 hour(s))  Lipase, blood  Status: None   Collection Time: 04/02/19  9:11 AM  Result Value Ref Range   Lipase 23 11 - 51 U/L    Comment: Performed at [facility], [place] [address], [place], [place] [address]  Comprehensive metabolic panel     Status: Abnormal   Collection Time: 04/02/19  9:11 AM  Result Value Ref Range   Sodium 137 135 - 145 mmol/L   Potassium 3.5 3.5 - 5.1 mmol/L   Chloride 103 98 - 111 mmol/L   CO2 24 22 - 32 mmol/L   Glucose, Bld 215 (H) 70 - 99 mg/dL   BUN 15 8 - 23 mg/dL   Creatinine, Ser 0.81 0.44 - 1.00 mg/dL   Calcium 8.5 (L) 8.9 - 10.3 mg/dL   Total Protein 7.4 6.5 - 8.1 g/dL   Albumin 3.7 3.5 - 5.0 g/dL   AST 14 (L) 15 - 41 U/L   ALT 20 0 - 44 U/L   Alkaline Phosphatase 66 38 - 126 U/L   Total Bilirubin 1.0 0.3 - 1.2 mg/dL   GFR calc non Af Amer >60 >60 mL/min   GFR calc Af Amer >60 >60 mL/min   Anion gap 10 5 - 15    Comment: Performed at [facility], [place] [address] [place], [place]  [address]  CBC     Status: Abnormal   Collection Time: 04/02/19  9:11 AM  Result Value Ref Range   WBC 15.6 (H) 4.0 - 10.5 K/uL   RBC 4.73 3.87 - 5.11 MIL/uL   Hemoglobin 14.4 12.0 - 15.0 g/dL   HCT 42.6 36.0 - 46.0 %   MCV 90.1 80.0 - 100.0 fL   MCH 30.4 26.0 - 34.0 pg   MCHC 33.8 30.0 - 36.0 g/dL   RDW 13.6 11.5 - 15.5 %   Platelets 214 150 - 400 K/uL   nRBC 0.0 0.0 - 0.2 %    Comment: Performed at [facility], [place] [address] [place], [place] [address]  Urinalysis, Routine w reflex microscopic     Status: Abnormal   Collection Time: 04/02/19  9:11 AM  Result Value Ref Range   Color, Urine YELLOW YELLOW   APPearance CLEAR CLEAR   Specific Gravity, Urine >1.046 (H) 1.005 - 1.030   pH 7.0 5.0 - 8.0   Glucose, UA NEGATIVE NEGATIVE mg/dL   Hgb urine dipstick NEGATIVE NEGATIVE   Bilirubin Urine NEGATIVE NEGATIVE   Ketones, ur NEGATIVE NEGATIVE mg/dL   Protein, ur 30 (A) NEGATIVE mg/dL   Nitrite NEGATIVE NEGATIVE   Leukocytes,Ua NEGATIVE NEGATIVE   RBC / HPF 0-5 0 - 5 RBC/hpf   WBC, UA 0-5 0 - 5 WBC/hpf   Bacteria, UA NONE SEEN NONE SEEN   Squamous Epithelial / LPF 0-5 0 - 5    Comment: Performed at [facility], [place] [address] [place], [place] [address]   Ct Abdomen Pelvis W Contrast  Result Date: 04/02/2019 CLINICAL DATA:  Abdominal pain EXAM: CT ABDOMEN AND PELVIS WITH CONTRAST TECHNIQUE: Multidetector CT imaging of the abdomen and pelvis was performed using the standard protocol following bolus administration of intravenous contrast. CONTRAST:  165mL OMNIPAQUE IOHEXOL 300 MG/ML  SOLN COMPARISON:  None. FINDINGS: Lower chest: No acute abnormality.  Coronary artery calcifications. Hepatobiliary: No solid liver abnormality is seen. No gallstones, gallbladder wall thickening, or biliary dilatation. Pancreas: Unremarkable. No pancreatic ductal dilatation or surrounding inflammatory changes. Spleen: Normal in size without significant abnormality.  Adrenals/Urinary Tract:  Adrenal glands are unremarkable. Kidneys are normal, without renal calculi, solid lesion, or hydronephrosis. Bladder is unremarkable. Stomach/Bowel: Stomach is within normal limits. Appendix appears normal. There is severe descending and sigmoid diverticulosis with extensive wall thickening, fat stranding, and inflammatory fluid about the proximal sigmoid. There is a small contained perforation and phlegmon about the posterior proximal sigmoid (series 2, image 56, series 6, image 143). Vascular/Lymphatic: Aortic atherosclerosis. No enlarged abdominal or pelvic lymph nodes. Reproductive: Status post hysterectomy. Other: No abdominal wall hernia. There is a fluid attenuation superficial subcutaneous cyst or nodule of the inframammary right chest (series 2, image 7). No abdominopelvic ascites. Musculoskeletal: No acute or significant osseous findings. IMPRESSION: 1. There is severe descending and sigmoid diverticulosis with extensive wall thickening, fat stranding, and inflammatory fluid about the proximal sigmoid. There is a small contained perforation and phlegmon about the posterior proximal sigmoid (series 2, image 56, series 6, image 143). Findings are consistent with acute diverticulitis complicated by contained perforation. No evidence of overt perforation or overt abscess formation. 2. There is a fluid attenuation superficial subcutaneous cyst or nodule of the inframammary right chest (series 2, image 7). Correlate with physical examination for boil, inclusion cyst, or other cutaneous finding. Targeted ultrasound may be used to further characterize if desired. 3. Other chronic, incidental, and postoperative findings as detailed above. Electronically Signed   By: Eddie Candle M.D.   On: 04/02/2019 11:11      Assessment/Plan A. fib on chronic Coumadin (last dose 6/24 PM) - hold CAD status post CABG (2017) Hypertension Hyperlipidemia Asthma CHF (EF 65-70% on last  echo-05/26/2018) Hyperglycemia   Acute sigmoid diverticulitis w/ contained perforation - No indication for emergent or urgent surgery - No drainable abscess on CT - Agree with IV abx - Bowel rest - IVF per TR[facility] given hx of CHF - Mobilize and IS - Hopefully this will resolve without surgery. We discussed the possibility of surgery or drain placement during admission. If this resolves without surgery, patient will need colonoscopy in 4-6 weeks after resolution.   ID - Zosyn > WBC 15,600 VTE - SCDs, Hold coumadin, okay for heparin drip FEN - NPO, IVF  Plan - Admit to medicine. NPO, bowel rest, IVF, pain control, antiemetics, antibiotics (Zosyn). Ambulate and IS.  Jillyn Ledger, [facility] 04/02/2019, 12:22 PM Pager: [phone]

## 2019-04-02 NOTE — H&P (Addendum)
History and Physical    Danielle Payne WCB:[id number] DOB: [birth year] DOA: 04/02/2019  PCP: Luetta Nutting, DO  Patient coming from: Home  I have personally briefly reviewed patient's old medical records in [place]  Chief Complaint: Abdominal pain  HPI: Danielle Payne is a 73 y.o. female with medical history significant of chronic atrial fibrillation on Coumadin, hypertension, asthma, chronic combined systolic and diastolic CHF, CAD and hyperlipidemia presented to ER with a complaint of abdominal pain.  According to patient, she has been having abdominal pain since about 2 months.  The pain is located mostly in the lower abdomen but recently has gotten more pronounced on the left lower quadrant.  Per patient, the pain has been intermittent, dull with no aggravating or relieving factor with the highest being about 5 out of 10 but it has gotten worse over the course of last week.  She went to see her PCP last week who had a scheduled an ultrasound of abdomen for July 8 however since patient symptoms kept getting worse so she decided to come to the emergency department.  She also had subjective low-grade fever at home last night along with some chills.  She did not check the temperature.  No sweating, chest pain, shortness of breath or any problem with urination.  She has also been constipated lately.  No recent travel or sick contact.  ED Course: Upon arrival to the emergency department, she was hemodynamically stable.  CBC showed mild leukocytosis.  BMP unremarkable except hyperglycemia.  CT abdomen pelvis was done which showed colonic diverticulitis with contained perforation.  EDP had discussed the case with general surgery however the preferred to be consultant on the case so hospital service was called for admission.  Review of Systems: As per HPI otherwise 10 point review of systems negative.    Past Medical History:  Diagnosis Date   Allergy    Asthma    Atrial fibrillation  (HCC)    Chronic combined systolic and diastolic heart failure ([place]) 05/14/2018   Coronary artery disease    Hypertension    Pure hypercholesterolemia 05/14/2018   Stented coronary artery     Past Surgical History:  Procedure Laterality Date   ABDOMINAL HYSTERECTOMY     CARDIAC CATHETERIZATION     CARDIOVERSION N/A 12/21/2014   Procedure: CARDIOVERSION;  Surgeon: Adrian Prows, MD;  Location: [facility];  Service: Cardiovascular;  Laterality: N/A;   CORONARY ANGIOPLASTY     CORONARY ARTERY BYPASS GRAFT       reports that she has never smoked. She has never used smokeless tobacco. She reports that she does not drink alcohol or use drugs.  No Known Allergies  Family History  Problem Relation Age of Onset   CAD Mother    Stroke Mother    Hypertension Mother    Prostate cancer Father    Diabetes Mellitus II Sister    Diabetes Mellitus II Brother    CAD Maternal Aunt     Prior to Admission medications   Medication Sig Start Date End Date Taking? Authorizing Provider  albuterol (PROVENTIL HFA;VENTOLIN HFA) 108 (90 Base) MCG/ACT inhaler INHALE TWO PUFFS INTO THE LUNGS EVERY 4 HOURS AS NEEDED FOR WHEEZING OR SHORTNESS OF BREATH Patient taking differently: Inhale 2 puffs into the lungs every 4 (four) hours as needed for wheezing or shortness of breath.  12/09/18  Yes Luetta Nutting, DO  carvedilol (COREG) 25 MG tablet Take 1 tablet (25 mg total) by mouth 2 (two) times daily with  a meal. Patient taking differently: Take 12.5-25 mg by mouth See admin instructions. Take 25 mg by mouth in the morning and 12.5 mg in the evening 11/17/18  Yes Skeet Latch, MD  [facility] Liver Oil CAPS Take 1 capsule by mouth daily.   Yes [provider]  diltiazem (CARDIZEM CD) 180 MG 24 hr capsule Take 1 capsule (180 mg total) by mouth daily. Patient taking differently: Take 180 mg by mouth daily. At 4 pm 10/13/18  Yes Skeet Latch, MD  fluticasone [facility] [address]) 50 MCG/ACT nasal spray Place  2 sprays into both nostrils daily as needed for allergies or rhinitis. 02/27/19  Yes Skeet Latch, MD  furosemide (LASIX) 40 MG tablet Take 40 mg by mouth daily as needed for fluid or edema.    Yes [provider]  loratadine (CLARITIN) 10 MG tablet Take 10 mg by mouth at bedtime.   Yes [provider]  Melatonin 3 MG CAPS Take 3 mg by mouth at bedtime.    Yes [provider]  mupirocin ointment (BACTROBAN) 2 % Place 1 application into the nose 2 (two) times daily. 03/09/19  Yes Luetta Nutting, DO  warfarin (COUMADIN) 5 MG tablet Take 1/2 to 1 tablets daily as directed by coumadin clinic Patient taking differently: Take 2.5-5 mg by mouth See admin instructions. Take 2.5 mg by mouth on Monday, Wednesday, Friday and 5 mg on Sunday, Tuesday, Thursday as directed by coumadin clinic 06/03/18  Yes Skeet Latch, MD    Physical Exam: Vitals:   04/02/19 0911 04/02/19 1000 04/02/19 1115 04/02/19 1145  BP: (!) 156/77 137/79  139/85  Pulse: 85 (!) 58 84 75  Resp: 18   18  Temp: 98.7 F (37.1 C)     TempSrc: Oral     SpO2: 95% 98% 96% 100%  Weight:      Height:        Constitutional: NAD, calm, comfortable Vitals:   04/02/19 0911 04/02/19 1000 04/02/19 1115 04/02/19 1145  BP: (!) 156/77 137/79  139/85  Pulse: 85 (!) 58 84 75  Resp: 18   18  Temp: 98.7 F (37.1 C)     TempSrc: Oral     SpO2: 95% 98% 96% 100%  Weight:      Height:       Eyes: PERRL, lids and conjunctivae normal ENMT: Mucous membranes are moist. Posterior pharynx clear of any exudate or lesions.Normal dentition.  Neck: normal, supple, no masses, no thyromegaly Respiratory: clear to auscultation bilaterally, no wheezing, no crackles. Normal respiratory effort. No accessory muscle use.  Cardiovascular: Irregularly irregular rate and rhythm, no murmurs / rubs / gallops. No extremity edema. 2+ pedal pulses. No carotid bruits.  Abdomen: Lower abdominal tenderness, more pronounced on left lower  quadrant with rebound tenderness and guarding, no masses palpated. No hepatosplenomegaly. Bowel sounds positive.  Musculoskeletal: no clubbing / cyanosis. No joint deformity upper and lower extremities. Good ROM, no contractures. Normal muscle tone.  Skin: no rashes, lesions, ulcers. No induration Neurologic: CN 2-12 grossly intact. Sensation intact, DTR normal. Strength 5/5 in all 4.  Psychiatric: Normal judgment and insight. Alert and oriented x 3. Normal mood.    Labs on Admission: I have personally reviewed following labs and imaging studies  CBC: Recent Labs  Lab 04/02/19 0911  WBC 15.6*  HGB 14.4  HCT 42.6  MCV 90.1  PLT 628   Basic Metabolic Panel: Recent Labs  Lab 04/02/19 0911  NA 137  K 3.5  CL 103  CO2 24  GLUCOSE 215*  BUN 15  CREATININE 0.81  CALCIUM 8.5*   GFR: Estimated Creatinine Clearance: 64.3 mL/min (by C-G formula based on SCr of 0.81 mg/dL). Liver Function Tests: Recent Labs  Lab 04/02/19 0911  AST 14*  ALT 20  ALKPHOS 66  BILITOT 1.0  PROT 7.4  ALBUMIN 3.7   Recent Labs  Lab 04/02/19 0911  LIPASE 23   No results for input(s): AMMONIA in the last 168 hours. Coagulation Profile: No results for input(s): INR, PROTIME in the last 168 hours. Cardiac Enzymes: No results for input(s): CKTOTAL, CKMB, CKMBINDEX, TROPONINI in the last 168 hours. BNP (last 3 results) No results for input(s): PROBNP in the last 8760 hours. HbA1C: No results for input(s): HGBA1C in the last 72 hours. CBG: No results for input(s): GLUCAP in the last 168 hours. Lipid Profile: No results for input(s): CHOL, HDL, LDLCALC, TRIG, CHOLHDL, LDLDIRECT in the last 72 hours. Thyroid Function Tests: No results for input(s): TSH, T4TOTAL, FREET4, T3FREE, THYROIDAB in the last 72 hours. Anemia Panel: No results for input(s): VITAMINB12, FOLATE, FERRITIN, TIBC, IRON, RETICCTPCT in the last 72 hours. Urine analysis:    Component Value Date/Time   COLORURINE YELLOW  04/02/2019 0911   APPEARANCEUR CLEAR 04/02/2019 0911   LABSPEC >1.046 (H) 04/02/2019 0911   PHURINE 7.0 04/02/2019 0911   GLUCOSEU NEGATIVE 04/02/2019 0911   HGBUR NEGATIVE 04/02/2019 0911   BILIRUBINUR NEGATIVE 04/02/2019 0911   BILIRUBINUR neg 06/25/2018 1430   KETONESUR NEGATIVE 04/02/2019 0911   PROTEINUR 30 (A) 04/02/2019 0911   UROBILINOGEN 0.2 06/25/2018 1430   NITRITE NEGATIVE 04/02/2019 0911   LEUKOCYTESUR NEGATIVE 04/02/2019 0911    Radiological Exams on Admission: Ct Abdomen Pelvis W Contrast  Result Date: 04/02/2019 CLINICAL DATA:  Abdominal pain EXAM: CT ABDOMEN AND PELVIS WITH CONTRAST TECHNIQUE: Multidetector CT imaging of the abdomen and pelvis was performed using the standard protocol following bolus administration of intravenous contrast. CONTRAST:  170mL OMNIPAQUE IOHEXOL 300 MG/ML  SOLN COMPARISON:  None. FINDINGS: Lower chest: No acute abnormality.  Coronary artery calcifications. Hepatobiliary: No solid liver abnormality is seen. No gallstones, gallbladder wall thickening, or biliary dilatation. Pancreas: Unremarkable. No pancreatic ductal dilatation or surrounding inflammatory changes. Spleen: Normal in size without significant abnormality. Adrenals/Urinary Tract: Adrenal glands are unremarkable. Kidneys are normal, without renal calculi, solid lesion, or hydronephrosis. Bladder is unremarkable. Stomach/Bowel: Stomach is within normal limits. Appendix appears normal. There is severe descending and sigmoid diverticulosis with extensive wall thickening, fat stranding, and inflammatory fluid about the proximal sigmoid. There is a small contained perforation and phlegmon about the posterior proximal sigmoid (series 2, image 56, series 6, image 143). Vascular/Lymphatic: Aortic atherosclerosis. No enlarged abdominal or pelvic lymph nodes. Reproductive: Status post hysterectomy. Other: No abdominal wall hernia. There is a fluid attenuation superficial subcutaneous cyst or nodule of  the inframammary right chest (series 2, image 7). No abdominopelvic ascites. Musculoskeletal: No acute or significant osseous findings. IMPRESSION: 1. There is severe descending and sigmoid diverticulosis with extensive wall thickening, fat stranding, and inflammatory fluid about the proximal sigmoid. There is a small contained perforation and phlegmon about the posterior proximal sigmoid (series 2, image 56, series 6, image 143). Findings are consistent with acute diverticulitis complicated by contained perforation. No evidence of overt perforation or overt abscess formation. 2. There is a fluid attenuation superficial subcutaneous cyst or nodule of the inframammary right chest (series 2, image 7). Correlate with physical examination for boil, inclusion cyst, or other cutaneous finding.  Targeted ultrasound may be used to further characterize if desired. 3. Other chronic, incidental, and postoperative findings as detailed above. Electronically Signed   By: Eddie Candle M.D.   On: 04/02/2019 11:11     Assessment/Plan Active Problems:   Atrial fibrillation (HCC)   Essential hypertension   CAD (coronary artery disease)   Chronic combined systolic and diastolic heart failure (HCC)   Diverticulitis of colon with perforation    Acute colonic diverticulitis with contained perforation: Patient has not received any biotics in the emergency department.  We will admit her to MedSurg floor and started on Zosyn IV.  Keep her on clear liquid diet.  General surgery to see patient in consultation.  Chronic atrial fibrillation: Rate controlled.  Resume Cardizem.  Will check INR today and tomorrow.  Monitor on telemetry.  Hypertension: Blood pressure controlled.  Resume carvedilol.  Hyperglycemia: No history of diabetes according to the chart and no hemoglobin A1c in the chart.  Currently hyperglycemic.  Will check hemoglobin A1c.  Chronic combined systolic and diastolic congestive heart failure: Stable.  On Lasix  at home which I will hold for now as she is going to be n.p.o. with gentle hydration.  Monitor closely.  DVT prophylaxis: We will consult pharmacy for dosing Coumadin. Code Status: Full code Family Communication: None present at bedside.  Patient alert and oriented and competent.  Discussed plan of admission with the patient in detail. Disposition Plan: Will likely be discharged back home in next 2 to 3 days. Consults called: General surgery Admission status: Inpatient   Darliss Cheney MD [facility] Pager [phone]  If 7PM-7AM, please contact night-coverage [email or web address] Password [facility]  04/02/2019, 12:35 PM

## 2019-04-02 NOTE — Progress Notes (Addendum)
[place] for warfarin -->heparin Indication: hx atrial fibrillation  No Known Allergies  Patient Measurements: Height: 5\' 3"  (160 cm) Weight: 195 lb (88.5 kg) IBW/kg (Calculated) : 52.4 Heparin Dosing Weight:   Vital Signs: Temp: 98.7 F (37.1 C) (06/25 0911) Temp Source: Oral (06/25 0911) BP: 139/85 (06/25 1145) Pulse Rate: 75 (06/25 1145)  Labs: Recent Labs    04/02/19 0911  HGB 14.4  HCT 42.6  PLT 214  CREATININE 0.81    Estimated Creatinine Clearance: 64.3 mL/min (by C-G formula based on SCr of 0.81 mg/dL).   Medications:  PTA warfarin regimen:  5mg  daily except 2.5 mg on MWF (last dose taKen on 6/24)  Assessment: Patient is a 73 y.o F with hx afib on warfarin PTA presented to the ED on 6/25 with c/o abd pain. Abd CT on 6/25 showed showed findings consistent with acute diverticulitis with contained perforation without evidence of abscess formation.  Goal of Therapy:  INR 2-3 Monitor platelets by anticoagulation protocol: Yes   Plan:  - Per [facility], to hold warfarin for now for possible surgical intervention - Pharmacy will sign off for warfarin consult - Re-consult [place] when warfarin is resumed for patient  Lynelle Doctor 04/02/2019,12:48 PM  ________________________________  Adden (2PM): Per Alferd Apa ([facility] PA), transition patient to heparin drip while patient's warfarin is on hold. - INR 2.5 - d/c lovenox 40 mg SQ - Will start heparin drip when INR <2  Dia Sitter, PharmD, BCPS 04/02/2019 2:04 PM

## 2019-04-02 NOTE — ED Triage Notes (Signed)
Pt states abd pain x 2 weeks. Pt states she saw PCP, who scheduled [place] for July 8. Pt states abd pain is generalized. Denies N/V/D.

## 2019-04-03 DIAGNOSIS — I5042 Chronic combined systolic (congestive) and diastolic (congestive) heart failure: Secondary | ICD-10-CM

## 2019-04-03 LAB — PROTIME-INR
INR: 2.2 — ABNORMAL HIGH (ref 0.8–1.2)
Prothrombin Time: 24.2 seconds — ABNORMAL HIGH (ref 11.4–15.2)

## 2019-04-03 LAB — CBC
HCT: 41.9 % (ref 36.0–46.0)
Hemoglobin: 13.2 g/dL (ref 12.0–15.0)
MCH: 29 pg (ref 26.0–34.0)
MCHC: 31.5 g/dL (ref 30.0–36.0)
MCV: 92.1 fL (ref 80.0–100.0)
Platelets: 211 10*3/uL (ref 150–400)
RBC: 4.55 MIL/uL (ref 3.87–5.11)
RDW: 13.7 % (ref 11.5–15.5)
WBC: 15 10*3/uL — ABNORMAL HIGH (ref 4.0–10.5)
nRBC: 0 % (ref 0.0–0.2)

## 2019-04-03 LAB — GLUCOSE, CAPILLARY
Glucose-Capillary: 127 mg/dL — ABNORMAL HIGH (ref 70–99)
Glucose-Capillary: 224 mg/dL — ABNORMAL HIGH (ref 70–99)

## 2019-04-03 LAB — COMPREHENSIVE METABOLIC PANEL
ALT: 18 U/L (ref 0–44)
AST: 13 U/L — ABNORMAL LOW (ref 15–41)
Albumin: 3.3 g/dL — ABNORMAL LOW (ref 3.5–5.0)
Alkaline Phosphatase: 52 U/L (ref 38–126)
Anion gap: 10 (ref 5–15)
BUN: 19 mg/dL (ref 8–23)
CO2: 23 mmol/L (ref 22–32)
Calcium: 8.3 mg/dL — ABNORMAL LOW (ref 8.9–10.3)
Chloride: 104 mmol/L (ref 98–111)
Creatinine, Ser: 1 mg/dL (ref 0.44–1.00)
GFR calc Af Amer: >60 mL/min (ref >60)
GFR calc non Af Amer: 55 mL/min — ABNORMAL LOW (ref >60)
Glucose, Bld: 161 mg/dL — ABNORMAL HIGH (ref 70–99)
Potassium: 3.4 mmol/L — ABNORMAL LOW (ref 3.5–5.1)
Sodium: 137 mmol/L (ref 135–145)
Total Bilirubin: 1.5 mg/dL — ABNORMAL HIGH (ref 0.3–1.2)
Total Protein: 6.8 g/dL (ref 6.5–8.1)

## 2019-04-03 LAB — NOVEL CORONAVIRUS, NAA (HOSP ORDER, SEND-OUT TO REF LAB; TAT 18-24 HRS): SARS-CoV-2, NAA: NOT DETECTED

## 2019-04-03 MED ORDER — INSULIN ASPART 100 UNIT/ML ~~LOC~~ SOLN
0.0000 [IU] | Freq: Three times a day (TID) | SUBCUTANEOUS | Status: DC
  Administered 2019-04-03 – 2019-04-04 (×2): 1 [IU] via SUBCUTANEOUS
  Administered 2019-04-04 (×2): 2 [IU] via SUBCUTANEOUS
  Administered 2019-04-05: 1 [IU] via SUBCUTANEOUS

## 2019-04-03 MED ORDER — LIVING WELL WITH DIABETES BOOK
Freq: Once | Status: AC
  Administered 2019-04-03: 1
  Filled 2019-04-03: qty 1

## 2019-04-03 MED ORDER — MORPHINE SULFATE (PF) 2 MG/ML IV SOLN: 1.0000 mg | INTRAVENOUS | Status: DC | PRN

## 2019-04-03 NOTE — Progress Notes (Signed)
PROGRESS NOTE  Danielle Payne IEP:[id number] DOB: [birth year] DOA: 04/02/2019 PCP: Luetta Nutting, DO  HPI/Recap of past 24 hours: HPI from Dr Theophilus Kinds is a 73 y.o. female with medical history significant of chronic atrial fibrillation on Coumadin, hypertension, asthma, Hx of chronic combined systolic and diastolic CHF, CAD and hyperlipidemia presented to ER with a complaint of abdominal pain.  According to patient, she has been having abdominal pain since about 2 months.  The pain is located mostly in the lower abdomen but recently has gotten more pronounced on the left lower quadrant.  Per patient, the pain has been intermittent, dull with no aggravating or relieving factor with the highest being about 5 out of 10 but it has gotten worse over the course of last week.  She went to see her PCP last week who had a scheduled an ultrasound of abdomen for July 8 however since patient symptoms kept getting worse so she decided to come to the emergency department.  She also had subjective low-grade fever at home last night along with some chills. No sweating, chest pain, shortness of breath or any problem with urination.  She has also been constipated lately.  No recent travel or sick contact. In the emergency department, she was hemodynamically stable.  CBC showed mild leukocytosis.  BMP unremarkable except hyperglycemia.  CT abdomen pelvis was done which showed colonic diverticulitis with contained perforation.  EDP discussed the case with general surgery, they asked hospital service to admit   Today, patient reported feeling okay.  Still with mild left lower quadrant abdominal pain, denies any nausea/vomiting/diarrhea.  Patient is passing gas.  Patient denies any fever/chills, chest pain, shortness of breath.  Assessment/Plan: Active Problems:   Atrial fibrillation (HCC)   Essential hypertension   CAD (coronary artery disease)   Chronic combined systolic and diastolic heart failure  (HCC)   Diverticulitis of colon with perforation  Acute sigmoid diverticulitis with contained perforation Currently afebrile, with leukocytosis CT abdomen showed colonic diverticulitis with contained perforation Continue IV Zosyn, gentle IV fluids General surgery on board, plan for conservative management with bowel rest, IV fluids and IV Zosyn Monitor closely for worsening abdominal signs  Newly diagnosed diabetes mellitus type 2 Last A1c in 2019 was 6.4, repeat done 04/02/2019 is 7.6 SSI, Accu-Cheks, hypoglycemic protocol Diabetes coordinator consulted  Chronic atrial fibrillation Rate controlled Continue Cardizem, hold Coreg for now pending HR/BP (BP soft this am) Pharmacy consulted for anticoagulation management-plan to start heparin drip once INR is less than 2 in anticipation for possible surgery Daily INR for now  Hypertension Blood pressure stable  Continue Cardizem, hold carvedilol for now  History of combined systolic and diastolic CHF Compensated, appears euvolemic Last echo on 05/26/18 showed EF of 65 to 70%, no wall motion abnormality Continue to hold home Lasix for now  CAD History of CABG/stents placed Currently chest pain-free Monitor for 24 hours on telemetry  Obesity BMI of about 36 kg/m Advised lifestyle modification       Malnutrition Type:      Malnutrition Characteristics:      Nutrition Interventions:       Estimated body mass index is 36.05 kg/m as calculated from the following:   Height as of this encounter: 5\' 3"  (1.6 m).   Weight as of this encounter: 92.3 kg.     Code Status: Full  Family Communication: None at bedside  Disposition Plan: Home once management complete/stable   Consultants:  General surgery  Procedures:  None  Antimicrobials:  IV Zosyn  DVT prophylaxis: Heparin drip once INR less than 2   Objective: Vitals:   04/02/19 1955 04/03/19 0614 04/03/19 0700 04/03/19 1331  BP: 123/71 105/62   (!) 143/84  Pulse: 90 78  66  Resp: 20 17  18   Temp: 100.1 F (37.8 C) 98.5 F (36.9 C)  98.8 F (37.1 C)  TempSrc: Oral Oral  Oral  SpO2: 94% 98%  97%  Weight:   92.3 kg   Height:        Intake/Output Summary (Last 24 hours) at 04/03/2019 1341 Last data filed at 04/03/2019 0841 Gross per 24 hour  Intake 528.93 ml  Output -  Net 528.93 ml   Filed Weights   04/02/19 0910 04/03/19 0700  Weight: 88.5 kg 92.3 kg    Exam:  General: NAD   Cardiovascular: S1, S2 present  Respiratory: CTAB  Abdomen: Soft, +tender on LLQ, nondistended, bowel sounds present  Musculoskeletal: No bilateral pedal edema noted  Skin: Normal  Psychiatry: Normal mood   Data Reviewed: CBC: Recent Labs  Lab 04/02/19 0911 04/02/19 1339 04/03/19 0521  WBC 15.6* 16.0* 15.0*  HGB 14.4 14.4 13.2  HCT 42.6 45.6 41.9  MCV 90.1 92.5 92.1  PLT 214 212 244   Basic Metabolic Panel: Recent Labs  Lab 04/02/19 0911 04/03/19 0521  NA 137 137  K 3.5 3.4*  CL 103 104  CO2 24 23  GLUCOSE 215* 161*  BUN 15 19  CREATININE 0.81 1.00  CALCIUM 8.5* 8.3*   GFR: Estimated Creatinine Clearance: 53.3 mL/min (by C-G formula based on SCr of 1 mg/dL). Liver Function Tests: Recent Labs  Lab 04/02/19 0911 04/03/19 0521  AST 14* 13*  ALT 20 18  ALKPHOS 66 52  BILITOT 1.0 1.5*  PROT 7.4 6.8  ALBUMIN 3.7 3.3*   Recent Labs  Lab 04/02/19 0911  LIPASE 23   No results for input(s): AMMONIA in the last 168 hours. Coagulation Profile: Recent Labs  Lab 04/02/19 1339 04/03/19 0521  INR 2.5* 2.2*   Cardiac Enzymes: No results for input(s): CKTOTAL, CKMB, CKMBINDEX, TROPONINI in the last 168 hours. BNP (last 3 results) No results for input(s): PROBNP in the last 8760 hours. HbA1C: Recent Labs    04/02/19 0911  HGBA1C 7.6*   CBG: No results for input(s): GLUCAP in the last 168 hours. Lipid Profile: No results for input(s): CHOL, HDL, LDLCALC, TRIG, CHOLHDL, LDLDIRECT in the last 72 hours.  Thyroid Function Tests: No results for input(s): TSH, T4TOTAL, FREET4, T3FREE, THYROIDAB in the last 72 hours. Anemia Panel: No results for input(s): VITAMINB12, FOLATE, FERRITIN, TIBC, IRON, RETICCTPCT in the last 72 hours. Urine analysis:    Component Value Date/Time   COLORURINE YELLOW 04/02/2019 0911   APPEARANCEUR CLEAR 04/02/2019 0911   LABSPEC >1.046 (H) 04/02/2019 0911   PHURINE 7.0 04/02/2019 0911   GLUCOSEU NEGATIVE 04/02/2019 0911   HGBUR NEGATIVE 04/02/2019 0911   BILIRUBINUR NEGATIVE 04/02/2019 0911   BILIRUBINUR neg 06/25/2018 1430   KETONESUR NEGATIVE 04/02/2019 0911   PROTEINUR 30 (A) 04/02/2019 0911   UROBILINOGEN 0.2 06/25/2018 1430   NITRITE NEGATIVE 04/02/2019 0911   LEUKOCYTESUR NEGATIVE 04/02/2019 0911   Sepsis Labs: @LABRCNTIP (procalcitonin:4,lacticidven:4)  ) Recent Results (from the past 240 hour(s))  Novel Coronavirus,NAA,(SEND-OUT TO REF LAB - TAT 24-48 hrs); Hosp Order     Status: None   Collection Time: 04/02/19 11:24 AM   Specimen: Nasopharyngeal Swab; Respiratory  Result Value Ref Range Status   SARS-CoV-2, NAA  NOT DETECTED NOT DETECTED Final    Comment: (NOTE) This test was developed and its performance characteristics determined by [employer]. This test has not been FDA cleared or approved. This test has been authorized by FDA under an Emergency Use Authorization (EUA). This test is only authorized for the duration of time the declaration that circumstances exist justifying the authorization of the emergency use of in vitro diagnostic tests for detection of SARS-CoV-2 virus and/or diagnosis of COVID-19 infection under section 564(b)(1) of the Act, 21 U.S.C. 177LTJ-0(Z)(0), unless the authorization is terminated or revoked sooner. When diagnostic testing is negative, the possibility of a false negative result should be considered in the context of a patient's recent exposures and the presence of clinical signs and symptoms  consistent with COVID-19. An individual without symptoms of COVID-19 and who is not shedding SARS-CoV-2 virus would expect to have a negative (not detected) result in this assay. Performed  At: [facility] [place], [place] [id number] Rush Farmer MD AU:[id number]    [place]  Final    Comment: Performed at [facility] [address], [place], [place] [address]      Studies: No results found.  Scheduled Meds: . diltiazem  180 mg Oral Daily  . sodium chloride flush  3 mL Intravenous Q12H    Continuous Infusions: . sodium chloride Stopped (04/02/19 1830)  . piperacillin-tazobactam (ZOSYN)  IV 3.375 g (04/03/19 1050)     LOS: 1 day     Alma Friendly, MD [facility]  If 7PM-7AM, please contact night-coverage [email or web address] 04/03/2019, 1:41 PM

## 2019-04-03 NOTE — Progress Notes (Signed)
Subjective: CC: Abdominal pain Patient reports that her abdominal pain is significantly improved. She notes that her pain was a 6/10 when she presented to the Ed yesterday and now is a 1/10. Her pain is still located in her LLQ. Mainly hurts when she is walking or after a BM. Notes she is passing flatus. Had a small, formed BM today that was non-bloody. Had clear liquids yesterday? Reports she tolerated this.   Objective: Vital signs in last 24 hours: Temp:  [97.7 F (36.5 C)-100.1 F (37.8 C)] 98.5 F (36.9 C) (06/26 2992) Pulse Rate:  [75-90] 78 (06/26 0614) Resp:  [16-20] 17 (06/26 0614) BP: (105-139)/(62-85) 105/62 (06/26 0614) SpO2:  [94 %-100 %] 98 % (06/26 4268) Weight:  [92.3 kg] 92.3 kg (06/26 0700) Last BM Date: 04/02/19  Intake/Output from previous day: 06/25 0701 - 06/26 0700 In: 48.9 [I.V.:5.4; IV Piggyback:43.6] Out: -  Intake/Output this shift: Total I/O In: 480 [P.O.:480] Out: -   PE: Gen: Awake and alert, NAD Heart: irregular rhythm, regular rate Lungs: CTA b/l, normal rate and effort Abd: Soft, ND, very mild tenderness of the LLQ without r/r/g. No peritonitis. +BS. Umbilical hernia is reducible.  Msk: no edema   Lab Results:  Recent Labs    04/02/19 1339 04/03/19 0521  WBC 16.0* 15.0*  HGB 14.4 13.2  HCT 45.6 41.9  PLT 212 211   BMET Recent Labs    04/02/19 0911 04/03/19 0521  NA 137 137  K 3.5 3.4*  CL 103 104  CO2 24 23  GLUCOSE 215* 161*  BUN 15 19  CREATININE 0.81 1.00  CALCIUM 8.5* 8.3*   PT/INR Recent Labs    04/02/19 1339 04/03/19 0521  LABPROT 26.5* 24.2*  INR 2.5* 2.2*   CMP     Component Value Date/Time   NA 137 04/03/2019 0521   NA 141 03/09/2019 0849   K 3.4 (L) 04/03/2019 0521   CL 104 04/03/2019 0521   CO2 23 04/03/2019 0521   GLUCOSE 161 (H) 04/03/2019 0521   BUN 19 04/03/2019 0521   BUN 15 03/09/2019 0849   CREATININE 1.00 04/03/2019 0521   CALCIUM 8.3 (L) 04/03/2019 0521   PROT 6.8 04/03/2019  0521   PROT 7.3 03/09/2019 0849   ALBUMIN 3.3 (L) 04/03/2019 0521   ALBUMIN 4.1 03/09/2019 0849   AST 13 (L) 04/03/2019 0521   ALT 18 04/03/2019 0521   ALKPHOS 52 04/03/2019 0521   BILITOT 1.5 (H) 04/03/2019 0521   BILITOT 0.5 03/09/2019 0849   GFRNONAA 55 (L) 04/03/2019 0521   GFRAA >60 04/03/2019 0521   Lipase     Component Value Date/Time   LIPASE 23 04/02/2019 0911       Studies/Results: Ct Abdomen Pelvis W Contrast  Result Date: 04/02/2019 CLINICAL DATA:  Abdominal pain EXAM: CT ABDOMEN AND PELVIS WITH CONTRAST TECHNIQUE: Multidetector CT imaging of the abdomen and pelvis was performed using the standard protocol following bolus administration of intravenous contrast. CONTRAST:  138mL OMNIPAQUE IOHEXOL 300 MG/ML  SOLN COMPARISON:  None. FINDINGS: Lower chest: No acute abnormality.  Coronary artery calcifications. Hepatobiliary: No solid liver abnormality is seen. No gallstones, gallbladder wall thickening, or biliary dilatation. Pancreas: Unremarkable. No pancreatic ductal dilatation or surrounding inflammatory changes. Spleen: Normal in size without significant abnormality. Adrenals/Urinary Tract: Adrenal glands are unremarkable. Kidneys are normal, without renal calculi, solid lesion, or hydronephrosis. Bladder is unremarkable. Stomach/Bowel: Stomach is within normal limits. Appendix appears normal. There is severe descending  and sigmoid diverticulosis with extensive wall thickening, fat stranding, and inflammatory fluid about the proximal sigmoid. There is a small contained perforation and phlegmon about the posterior proximal sigmoid (series 2, image 56, series 6, image 143). Vascular/Lymphatic: Aortic atherosclerosis. No enlarged abdominal or pelvic lymph nodes. Reproductive: Status post hysterectomy. Other: No abdominal wall hernia. There is a fluid attenuation superficial subcutaneous cyst or nodule of the inframammary right chest (series 2, image 7). No abdominopelvic ascites.  Musculoskeletal: No acute or significant osseous findings. IMPRESSION: 1. There is severe descending and sigmoid diverticulosis with extensive wall thickening, fat stranding, and inflammatory fluid about the proximal sigmoid. There is a small contained perforation and phlegmon about the posterior proximal sigmoid (series 2, image 56, series 6, image 143). Findings are consistent with acute diverticulitis complicated by contained perforation. No evidence of overt perforation or overt abscess formation. 2. There is a fluid attenuation superficial subcutaneous cyst or nodule of the inframammary right chest (series 2, image 7). Correlate with physical examination for boil, inclusion cyst, or other cutaneous finding. Targeted ultrasound may be used to further characterize if desired. 3. Other chronic, incidental, and postoperative findings as detailed above. Electronically Signed   By: Eddie Candle M.D.   On: 04/02/2019 11:11    Anti-infectives: Anti-infectives (From admission, onward)   Start     Dose/Rate Route Frequency Ordered Stop   04/02/19 1800  piperacillin-tazobactam (ZOSYN) IVPB 3.375 g     3.375 g 12.5 mL/hr over 240 Minutes Intravenous Every 8 hours 04/02/19 1247     04/02/19 1400  piperacillin-tazobactam (ZOSYN) IVPB 3.375 g  Status:  Discontinued     3.375 g 100 mL/hr over 30 Minutes Intravenous Every 8 hours 04/02/19 1204 04/02/19 1247   04/02/19 1130  piperacillin-tazobactam (ZOSYN) IVPB 3.375 g     3.375 g 100 mL/hr over 30 Minutes Intravenous  Once 04/02/19 1123 04/02/19 1216       Assessment/Plan A. Fib -  Coumadin held - on Heparin  CAD status post CABG (2017) Hypertension Hyperlipidemia Asthma CHF (EF 65-70% on last echo-05/26/2018) Hyperglycemia   Acute sigmoid diverticulitis w/ contained perforation - No indication for emergent or urgent surgery - No drainable abscess on CT - Continue IV abx - Recommended NPO/Bowel rest yesterday. Was started on CLD. Doing well. Can  advance for dinner to FLD. - IVF per [facility] given hx of CHF - Mobilize and IS - Hopefully this will resolve without surgery. We discussed the possibility of surgery or drain placement during admission. If this resolves without surgery, patient will need colonoscopy in 4-6 weeks after resolution.   ID - Zosyn 6/25 >> WBC 15,600->16,000->15,000 VTE - SCDs, Heparin drip FEN - CLD (Advance to FLD PM), IVF per [facility]    LOS: 1 day    Jillyn Ledger , [facility] 04/03/2019, 11:30 AM Pager: [phone]

## 2019-04-03 NOTE — Progress Notes (Signed)
[place] for warfarin -> heparin Indication: hx atrial fibrillation  No Known Allergies  Patient Measurements: Height: 5\' 3"  (160 cm) Weight: 203 lb 7.8 oz (92.3 kg) IBW/kg (Calculated) : 52.4 Heparin Dosing Weight:   Vital Signs: Temp: 98.5 F (36.9 C) (06/26 0614) Temp Source: Oral (06/26 0614) BP: 105/62 (06/26 0614) Pulse Rate: 78 (06/26 0614)  Labs: Recent Labs    04/02/19 0911 04/02/19 1339 04/03/19 0521  HGB 14.4 14.4 13.2  HCT 42.6 45.6 41.9  PLT 214 212 211  LABPROT  --  26.5* 24.2*  INR  --  2.5* 2.2*  CREATININE 0.81  --  1.00    Estimated Creatinine Clearance: 53.3 mL/min (by C-G formula based on SCr of 1 mg/dL).  Medications:  PTA warfarin regimen: 5mg  daily except 2.5 mg on MWF (last dose taken on 6/24)  Assessment: Patient is a 73 y.o F with hx afib on warfarin PTA presented to the ED on 6/25 with c/o abd pain. Abd CT on 6/25 showed showed findings consistent with acute diverticulitis with contained perforation without evidence of abscess formation. Warfarin currently on hold for possible surgical intervention, pharmacy consulted to initiate heparin drip when patient's INR < 2.   Patient's INR today 2.2, patient's CBC stable, Hgb 13.2, HCT 41.9 and pltc 211.   Goal of Therapy:  INR 2-3 Monitor platelets by anticoagulation protocol: Yes   Plan:  - Will start heparin drip when INR <2 - Follow-up plans for warfarin resumption - F/u daily INR and CBC  Thank you for allowing pharmacy to be a part of this patient's care.  Leron Croak, PharmD PGY1 Pharmacy Resident 04/03/2019,8:58 AM

## 2019-04-04 LAB — CBC WITH DIFFERENTIAL/PLATELET
Abs Immature Granulocytes: 0.03 10*3/uL (ref 0.00–0.07)
Basophils Absolute: 0.1 10*3/uL (ref 0.0–0.1)
Basophils Relative: 1 %
Eosinophils Absolute: 0.2 10*3/uL (ref 0.0–0.5)
Eosinophils Relative: 2 %
HCT: 41.1 % (ref 36.0–46.0)
Hemoglobin: 12.9 g/dL (ref 12.0–15.0)
Immature Granulocytes: 0 %
Lymphocytes Relative: 25 %
Lymphs Abs: 2 10*3/uL (ref 0.7–4.0)
MCH: 28.9 pg (ref 26.0–34.0)
MCHC: 31.4 g/dL (ref 30.0–36.0)
MCV: 92.2 fL (ref 80.0–100.0)
Monocytes Absolute: 0.8 10*3/uL (ref 0.1–1.0)
Monocytes Relative: 10 %
Neutro Abs: 5.1 10*3/uL (ref 1.7–7.7)
Neutrophils Relative %: 62 %
Platelets: 195 10*3/uL (ref 150–400)
RBC: 4.46 MIL/uL (ref 3.87–5.11)
RDW: 13.4 % (ref 11.5–15.5)
WBC: 8.1 10*3/uL (ref 4.0–10.5)
nRBC: 0 % (ref 0.0–0.2)

## 2019-04-04 LAB — BASIC METABOLIC PANEL
Anion gap: 12 (ref 5–15)
BUN: 15 mg/dL (ref 8–23)
CO2: 21 mmol/L — ABNORMAL LOW (ref 22–32)
Calcium: 8.5 mg/dL — ABNORMAL LOW (ref 8.9–10.3)
Chloride: 108 mmol/L (ref 98–111)
Creatinine, Ser: 0.79 mg/dL (ref 0.44–1.00)
GFR calc Af Amer: >60 mL/min (ref >60)
GFR calc non Af Amer: >60 mL/min (ref >60)
Glucose, Bld: 148 mg/dL — ABNORMAL HIGH (ref 70–99)
Potassium: 3.4 mmol/L — ABNORMAL LOW (ref 3.5–5.1)
Sodium: 141 mmol/L (ref 135–145)

## 2019-04-04 LAB — PROTIME-INR
INR: 2.1 — ABNORMAL HIGH (ref 0.8–1.2)
Prothrombin Time: 23.1 seconds — ABNORMAL HIGH (ref 11.4–15.2)

## 2019-04-04 LAB — GLUCOSE, CAPILLARY
Glucose-Capillary: 152 mg/dL — ABNORMAL HIGH (ref 70–99)
Glucose-Capillary: 189 mg/dL — ABNORMAL HIGH (ref 70–99)
Glucose-Capillary: 128 mg/dL — ABNORMAL HIGH (ref 70–99)
Glucose-Capillary: 175 mg/dL — ABNORMAL HIGH (ref 70–99)

## 2019-04-04 MED ORDER — TRAMADOL HCL 50 MG PO TABS: 50.0000 mg | ORAL_TABLET | Freq: Four times a day (QID) | ORAL | Status: DC | PRN

## 2019-04-04 MED ORDER — CARVEDILOL 25 MG PO TABS: 25.0000 mg | ORAL_TABLET | Freq: Two times a day (BID) | ORAL | Status: DC

## 2019-04-04 MED ORDER — CARVEDILOL 25 MG PO TABS
25.0000 mg | ORAL_TABLET | Freq: Two times a day (BID) | ORAL | Status: DC
  Administered 2019-04-04 – 2019-04-05 (×2): 25 mg via ORAL
  Filled 2019-04-04 (×2): qty 1

## 2019-04-04 MED ORDER — POLYETHYLENE GLYCOL 3350 17 G PO PACK
17.0000 g | PACK | Freq: Two times a day (BID) | ORAL | Status: DC
  Administered 2019-04-04 – 2019-04-05 (×3): 17 g via ORAL
  Filled 2019-04-04 (×3): qty 1

## 2019-04-04 NOTE — Progress Notes (Signed)
Subjective: CC: Abdominal pain Bloated but not having pain.   Objective: Vital signs in last 24 hours: Temp:  [98.1 F (36.7 C)-98.8 F (37.1 C)] 98.1 F (36.7 C) (06/27 0649) Pulse Rate:  [66-81] 79 (06/27 0649) Resp:  [18-21] 20 (06/27 0649) BP: (143-168)/(84-96) 168/96 (06/27 0649) SpO2:  [97 %] 97 % (06/27 0649) Last BM Date: 04/02/19  Intake/Output from previous day: 06/26 0701 - 06/27 0700 In: 3535.4 [P.O.:1680; I.V.:1649; IV Piggyback:206.5] Out: 1 [Urine:1] Intake/Output this shift: No intake/output data recorded.  PE: Gen: Awake and alert, NAD Heart: irregular rhythm, regular rate Lungs: CTA b/l, normal rate and effort Abd: Soft, mildly distended, very mild tenderness of the LLQ without r/r/g. No peritonitis. +BS. Umbilical hernia is reducible.  Msk: no edema   Lab Results:  Recent Labs    04/03/19 0521 04/04/19 0527  WBC 15.0* 8.1  HGB 13.2 12.9  HCT 41.9 41.1  PLT 211 195   BMET Recent Labs    04/03/19 0521 04/04/19 0527  NA 137 141  K 3.4* 3.4*  CL 104 108  CO2 23 21*  GLUCOSE 161* 148*  BUN 19 15  CREATININE 1.00 0.79  CALCIUM 8.3* 8.5*   PT/INR Recent Labs    04/03/19 0521 04/04/19 0527  LABPROT 24.2* 23.1*  INR 2.2* 2.1*   CMP     Component Value Date/Time   NA 141 04/04/2019 0527   NA 141 03/09/2019 0849   K 3.4 (L) 04/04/2019 0527   CL 108 04/04/2019 0527   CO2 21 (L) 04/04/2019 0527   GLUCOSE 148 (H) 04/04/2019 0527   BUN 15 04/04/2019 0527   BUN 15 03/09/2019 0849   CREATININE 0.79 04/04/2019 0527   CALCIUM 8.5 (L) 04/04/2019 0527   PROT 6.8 04/03/2019 0521   PROT 7.3 03/09/2019 0849   ALBUMIN 3.3 (L) 04/03/2019 0521   ALBUMIN 4.1 03/09/2019 0849   AST 13 (L) 04/03/2019 0521   ALT 18 04/03/2019 0521   ALKPHOS 52 04/03/2019 0521   BILITOT 1.5 (H) 04/03/2019 0521   BILITOT 0.5 03/09/2019 0849   GFRNONAA >60 04/04/2019 0527   GFRAA >60 04/04/2019 0527   Lipase     Component Value Date/Time   LIPASE 23  04/02/2019 0911       Studies/Results: Ct Abdomen Pelvis W Contrast  Result Date: 04/02/2019 CLINICAL DATA:  Abdominal pain EXAM: CT ABDOMEN AND PELVIS WITH CONTRAST TECHNIQUE: Multidetector CT imaging of the abdomen and pelvis was performed using the standard protocol following bolus administration of intravenous contrast. CONTRAST:  136mL OMNIPAQUE IOHEXOL 300 MG/ML  SOLN COMPARISON:  None. FINDINGS: Lower chest: No acute abnormality.  Coronary artery calcifications. Hepatobiliary: No solid liver abnormality is seen. No gallstones, gallbladder wall thickening, or biliary dilatation. Pancreas: Unremarkable. No pancreatic ductal dilatation or surrounding inflammatory changes. Spleen: Normal in size without significant abnormality. Adrenals/Urinary Tract: Adrenal glands are unremarkable. Kidneys are normal, without renal calculi, solid lesion, or hydronephrosis. Bladder is unremarkable. Stomach/Bowel: Stomach is within normal limits. Appendix appears normal. There is severe descending and sigmoid diverticulosis with extensive wall thickening, fat stranding, and inflammatory fluid about the proximal sigmoid. There is a small contained perforation and phlegmon about the posterior proximal sigmoid (series 2, image 56, series 6, image 143). Vascular/Lymphatic: Aortic atherosclerosis. No enlarged abdominal or pelvic lymph nodes. Reproductive: Status post hysterectomy. Other: No abdominal wall hernia. There is a fluid attenuation superficial subcutaneous cyst or nodule of the inframammary right chest (series 2, image 7).  No abdominopelvic ascites. Musculoskeletal: No acute or significant osseous findings. IMPRESSION: 1. There is severe descending and sigmoid diverticulosis with extensive wall thickening, fat stranding, and inflammatory fluid about the proximal sigmoid. There is a small contained perforation and phlegmon about the posterior proximal sigmoid (series 2, image 56, series 6, image 143). Findings are  consistent with acute diverticulitis complicated by contained perforation. No evidence of overt perforation or overt abscess formation. 2. There is a fluid attenuation superficial subcutaneous cyst or nodule of the inframammary right chest (series 2, image 7). Correlate with physical examination for boil, inclusion cyst, or other cutaneous finding. Targeted ultrasound may be used to further characterize if desired. 3. Other chronic, incidental, and postoperative findings as detailed above. Electronically Signed   By: Eddie Candle M.D.   On: 04/02/2019 11:11    Anti-infectives: Anti-infectives (From admission, onward)   Start     Dose/Rate Route Frequency Ordered Stop   04/02/19 1800  piperacillin-tazobactam (ZOSYN) IVPB 3.375 g     3.375 g 12.5 mL/hr over 240 Minutes Intravenous Every 8 hours 04/02/19 1247     04/02/19 1400  piperacillin-tazobactam (ZOSYN) IVPB 3.375 g  Status:  Discontinued     3.375 g 100 mL/hr over 30 Minutes Intravenous Every 8 hours 04/02/19 1204 04/02/19 1247   04/02/19 1130  piperacillin-tazobactam (ZOSYN) IVPB 3.375 g     3.375 g 100 mL/hr over 30 Minutes Intravenous  Once 04/02/19 1123 04/02/19 1216       Assessment/Plan A. Fib -  Coumadin held - on Heparin  CAD status post CABG (2017) Hypertension Hyperlipidemia Asthma CHF (EF 65-70% on last echo-05/26/2018) Hyperglycemia   Acute sigmoid diverticulitis w/ contained perforation - No indication for emergent or urgent surgery - No drainable abscess on CT - Continue IV abx - Advance to soft diet, stop IVF - Mobilize and IS - Hopefully this will resolve without surgery. We discussed the possibility of surgery or drain placement during admission. If this resolves without surgery, patient will need colonoscopy in 4-6 weeks after resolution.   ID - Zosyn 6/25 >> WBC 15,600->16,000->15,000, now 8.  VTE - SCDs, Heparin drip FEN - try soft diet, stop fluids. If WBC remains normal and pain minimal, can transition  to PO abx tomorrow and plan DC. If pain or bloating persists would recommend repeat CT 7 days after initial CT to assess for drainable abscess.     LOS: 2 days    [facility] 04/04/2019, 8:07 AM  Please see AMION to contact the provider on call if needed.

## 2019-04-04 NOTE — Progress Notes (Signed)
Inpatient Diabetes Program Recommendations  AACE/ADA: New Consensus Statement on Inpatient Glycemic Control (2015)  Target Ranges:  Prepandial:   less than 140 mg/dL      Peak postprandial:   less than 180 mg/dL (1-2 hours)      Critically ill patients:  140 - 180 mg/dL   Lab Results  Component Value Date   GLUCAP 224 (H) 04/03/2019   HGBA1C 7.6 (H) 04/02/2019    Review of Glycemic Control  Diabetes history: New-onset Outpatient Diabetes medications: N/A Current orders for Inpatient glycemic control: Novolog 0-9 units tidwc  HgbA1C - 7.6% New-onset DM. Pt's husband has diabetes and used his meter to check pt's blood sugar since she had symptoms of diabetes. Polyuria, polydipsia, fatigue.  Spoke with pt about new diagnosis, HgbA1C of 7.6% and goal of < 7%. Pt will need to f/u with PCP for diabetes management. Spoke with patient about new diabetes diagnosis.  Discussed basic pathophysiology of DM Type 2, basic home care, importance of checking CBGs and maintaining good CBG control to prevent long-term and short-term complications. Talked about importance of weight loss, exercise, eating healthy with portion control and leaving off high CHO drinks and sweets. Pt encouraged to check blood sugars 2-3x/day and take meter/logbook to MD visit for review.  Inpatient Diabetes Program Recommendations:     OP Diabetes Education consult for new-onset DM. Lifestyle modification with diet, exercise, stress management Metformin 500 mg bid  Stressed importance of weight loss and exercise in controlling blood sugars.  Will need prescription for blood glucose meter and supplies.  Answered questions and encouraged pt to attend OP Diabetes Education classes.   Discussed with RN.  Thank you. Lorenda Peck, RD, LDN, CDE Inpatient Diabetes Coordinator [phone]

## 2019-04-04 NOTE — Progress Notes (Signed)
PROGRESS NOTE  Danielle Payne VEL:[id number] DOB: [birth year] DOA: 04/02/2019 PCP: Luetta Nutting, DO  HPI/Recap of past 24 hours: HPI from Dr Theophilus Kinds is a 73 y.o. female with medical history significant of chronic atrial fibrillation on Coumadin, hypertension, asthma, Hx of chronic combined systolic and diastolic CHF, CAD and hyperlipidemia presented to ER with a complaint of abdominal pain.  According to patient, she has been having abdominal pain since about 2 months.  The pain is located mostly in the lower abdomen but recently has gotten more pronounced on the left lower quadrant.  Per patient, the pain has been intermittent, dull with no aggravating or relieving factor with the highest being about 5 out of 10 but it has gotten worse over the course of last week.  She went to see her PCP last week who had a scheduled an ultrasound of abdomen for July 8 however since patient symptoms kept getting worse so she decided to come to the emergency department.  She also had subjective low-grade fever at home last night along with some chills. No sweating, chest pain, shortness of breath or any problem with urination.  She has also been constipated lately.  No recent travel or sick contact. In the emergency department, she was hemodynamically stable.  CBC showed mild leukocytosis.  BMP unremarkable except hyperglycemia.  CT abdomen pelvis was done which showed colonic diverticulitis with contained perforation.  EDP discussed the case with general surgery, they asked hospital service to admit   Today, patient denies any worsening abdominal pain.  Denies any new complaints.  Assessment/Plan: Active Problems:   Atrial fibrillation (HCC)   Essential hypertension   CAD (coronary artery disease)   Chronic combined systolic and diastolic heart failure (HCC)   Diverticulitis of colon with perforation  Acute sigmoid diverticulitis with contained perforation Currently afebrile, with resolved  leukocytosis CT abdomen showed colonic diverticulitis with contained perforation Continue IV Zosyn DC IV fluids General surgery on board, plan for conservative management with bowel rest, and IV Zosyn Monitor closely for worsening abdominal signs As per surgery, plan for DC tomorrow if tolerating diet without significant abdominal pain  Newly diagnosed diabetes mellitus type 2 Last A1c in 2019 was 6.4, repeat done 04/02/2019 is 7.6 SSI, Accu-Cheks, hypoglycemic protocol Diabetes coordinator consulted Plan to DC patient on metformin  Chronic atrial fibrillation Rate controlled Continue Cardizem, [place] consulted for anticoagulation management-plan to start heparin drip once INR is less than 2 in anticipation for possible surgery Daily INR for now  Hypertension Blood pressure stable  Continue Cardizem, carvedilol   History of combined systolic and diastolic CHF Compensated, appears euvolemic Last echo on 05/26/18 showed EF of 65 to 70%, no wall motion abnormality Continue to hold home Lasix for now  CAD History of CABG/stents placed Currently chest pain-free Monitor for 24 hours on telemetry  Obesity BMI of about 36 kg/m Advised lifestyle modification       Malnutrition Type:      Malnutrition Characteristics:      Nutrition Interventions:       Estimated body mass index is 36.05 kg/m as calculated from the following:   Height as of this encounter: 5\' 3"  (1.6 m).   Weight as of this encounter: 92.3 kg.     Code Status: Full  Family Communication: None at bedside  Disposition Plan: Home once management complete/stable   Consultants:  General surgery  Procedures:  None  Antimicrobials:  IV Zosyn  DVT prophylaxis: Heparin drip once INR  less than 2   Objective: Vitals:   04/03/19 2019 04/04/19 0649 04/04/19 1508 04/04/19 1632  BP: (!) 143/94 (!) 168/96 (!) 167/112 (!) 145/82  Pulse: 81 79 83 70  Resp: (!) 21 20 15    Temp:  98.8 F (37.1 C) 98.1 F (36.7 C) 98.5 F (36.9 C)   TempSrc: Oral  Oral   SpO2: 97% 97% 98% 98%  Weight:      Height:        Intake/Output Summary (Last 24 hours) at 04/04/2019 1657 Last data filed at 04/04/2019 1600 Gross per 24 hour  Intake 1905.4 ml  Output -  Net 1905.4 ml   Filed Weights   04/02/19 0910 04/03/19 0700  Weight: 88.5 kg 92.3 kg    Exam:  General: NAD   Cardiovascular: S1, S2 present  Respiratory: CTAB  Abdomen: Soft, +TTP @ LLQ, nondistended, bowel sounds present  Musculoskeletal: No bilateral pedal edema noted  Skin: Normal  Psychiatry: Normal mood   Data Reviewed: CBC: Recent Labs  Lab 04/02/19 0911 04/02/19 1339 04/03/19 0521 04/04/19 0527  WBC 15.6* 16.0* 15.0* 8.1  NEUTROABS  --   --   --  5.1  HGB 14.4 14.4 13.2 12.9  HCT 42.6 45.6 41.9 41.1  MCV 90.1 92.5 92.1 92.2  PLT 214 212 211 350   Basic Metabolic Panel: Recent Labs  Lab 04/02/19 0911 04/03/19 0521 04/04/19 0527  NA 137 137 141  K 3.5 3.4* 3.4*  CL 103 104 108  CO2 24 23 21*  GLUCOSE 215* 161* 148*  BUN 15 19 15   CREATININE 0.81 1.00 0.79  CALCIUM 8.5* 8.3* 8.5*   GFR: Estimated Creatinine Clearance: 66.6 mL/min (by C-G formula based on SCr of 0.79 mg/dL). Liver Function Tests: Recent Labs  Lab 04/02/19 0911 04/03/19 0521  AST 14* 13*  ALT 20 18  ALKPHOS 66 52  BILITOT 1.0 1.5*  PROT 7.4 6.8  ALBUMIN 3.7 3.3*   Recent Labs  Lab 04/02/19 0911  LIPASE 23   No results for input(s): AMMONIA in the last 168 hours. Coagulation Profile: Recent Labs  Lab 04/02/19 1339 04/03/19 0521 04/04/19 0527  INR 2.5* 2.2* 2.1*   Cardiac Enzymes: No results for input(s): CKTOTAL, CKMB, CKMBINDEX, TROPONINI in the last 168 hours. BNP (last 3 results) No results for input(s): PROBNP in the last 8760 hours. HbA1C: Recent Labs    04/02/19 0911  HGBA1C 7.6*   CBG: Recent Labs  Lab 04/03/19 1634 04/03/19 2020 04/04/19 0800 04/04/19 1129 04/04/19 1634   GLUCAP 127* 224* 152* 189* 128*   Lipid Profile: No results for input(s): CHOL, HDL, LDLCALC, TRIG, CHOLHDL, LDLDIRECT in the last 72 hours. Thyroid Function Tests: No results for input(s): TSH, T4TOTAL, FREET4, T3FREE, THYROIDAB in the last 72 hours. Anemia Panel: No results for input(s): VITAMINB12, FOLATE, FERRITIN, TIBC, IRON, RETICCTPCT in the last 72 hours. Urine analysis:    Component Value Date/Time   COLORURINE YELLOW 04/02/2019 0911   APPEARANCEUR CLEAR 04/02/2019 0911   LABSPEC >1.046 (H) 04/02/2019 0911   PHURINE 7.0 04/02/2019 0911   GLUCOSEU NEGATIVE 04/02/2019 0911   HGBUR NEGATIVE 04/02/2019 0911   BILIRUBINUR NEGATIVE 04/02/2019 0911   BILIRUBINUR neg 06/25/2018 1430   KETONESUR NEGATIVE 04/02/2019 0911   PROTEINUR 30 (A) 04/02/2019 0911   UROBILINOGEN 0.2 06/25/2018 1430   NITRITE NEGATIVE 04/02/2019 0911   LEUKOCYTESUR NEGATIVE 04/02/2019 0911   Sepsis Labs: @LABRCNTIP (procalcitonin:4,lacticidven:4)  ) Recent Results (from the past 240 hour(s))  Novel Coronavirus,NAA,(SEND-OUT TO  REF LAB - TAT 24-48 hrs); Hosp Order     Status: None   Collection Time: 04/02/19 11:24 AM   Specimen: Nasopharyngeal Swab; Respiratory  Result Value Ref Range Status   SARS-CoV-2, NAA NOT DETECTED NOT DETECTED Final    Comment: (NOTE) This test was developed and its performance characteristics determined by [employer]. This test has not been FDA cleared or approved. This test has been authorized by FDA under an Emergency Use Authorization (EUA). This test is only authorized for the duration of time the declaration that circumstances exist justifying the authorization of the emergency use of in vitro diagnostic tests for detection of SARS-CoV-2 virus and/or diagnosis of COVID-19 infection under section 564(b)(1) of the Act, 21 U.S.C. 060RVI-1(B)(3), unless the authorization is terminated or revoked sooner. When diagnostic testing is negative, the possibility of a  false negative result should be considered in the context of a patient's recent exposures and the presence of clinical signs and symptoms consistent with COVID-19. An individual without symptoms of COVID-19 and who is not shedding SARS-CoV-2 virus would expect to have a negative (not detected) result in this assay. Performed  At: [facility] [place], [place] [id number] Rush Farmer MD JW:[id number]    [place]  Final    Comment: Performed at [facility] [address], [place], [place] [address]      Studies: No results found.  Scheduled Meds: . carvedilol  25 mg Oral BID WC  . diltiazem  180 mg Oral Daily  . insulin aspart  0-9 Units Subcutaneous TID WC  . polyethylene glycol  17 g Oral BID  . sodium chloride flush  3 mL Intravenous Q12H    Continuous Infusions: . piperacillin-tazobactam (ZOSYN)  IV 3.375 g (04/04/19 1700)     LOS: 2 days     Alma Friendly, MD [facility]  If 7PM-7AM, please contact night-coverage [email or web address] 04/04/2019, 4:57 PM

## 2019-04-04 NOTE — Progress Notes (Signed)
[place] for warfarin -> heparin Indication: chronic atrial fibrillation  No Known Allergies  Patient Measurements: Height: 5\' 3"  (160 cm) Weight: 203 lb 7.8 oz (92.3 kg) IBW/kg (Calculated) : 52.4   Vital Signs: Temp: 98.8 F (37.1 C) (06/26 2019) Temp Source: Oral (06/26 2019) BP: 143/94 (06/26 2019) Pulse Rate: 81 (06/26 2019)  Labs: Recent Labs    04/02/19 0911 04/02/19 1339 04/03/19 0521 04/04/19 0527  HGB 14.4 14.4 13.2 12.9  HCT 42.6 45.6 41.9 41.1  PLT 214 212 211 195  LABPROT  --  26.5* 24.2* 23.1*  INR  --  2.5* 2.2* 2.1*  CREATININE 0.81  --  1.00  --     Estimated Creatinine Clearance: 53.3 mL/min (by C-G formula based on SCr of 1 mg/dL).  Medications:  PTA warfarin regimen: 5mg  daily except 2.5 mg on MWF (last dose taken on 6/24)  Assessment: Patient is a 73 y.o F with hx chronic afib on warfarin PTA presented to the ED on 6/25 with c/o abd pain. Abd CT on 6/25 showed showed findings consistent with acute diverticulitis with contained perforation without evidence of abscess formation. Warfarin currently on hold for possible surgical intervention, pharmacy consulted to initiate heparin drip when patient's INR < 2.   Today, 04/04/2019 INR 2.1 Hgb, Hct, Pltc WNL and stable   Plan:  - Will start heparin drip when INR <2 - Follow-up plans for warfarin resumption - F/u daily INR and CBC  Thank you for allowing pharmacy to be a part of this patient's care.  Clayburn Pert, PharmD, BCPS [phone] 04/04/2019  6:33 AM

## 2019-04-05 DIAGNOSIS — E119 Type 2 diabetes mellitus without complications: Secondary | ICD-10-CM

## 2019-04-05 LAB — CBC WITH DIFFERENTIAL/PLATELET
Abs Immature Granulocytes: 0.04 10*3/uL (ref 0.00–0.07)
Basophils Absolute: 0.1 10*3/uL (ref 0.0–0.1)
Basophils Relative: 1 %
Eosinophils Absolute: 0.2 10*3/uL (ref 0.0–0.5)
Eosinophils Relative: 3 %
HCT: 39.3 % (ref 36.0–46.0)
Hemoglobin: 12.4 g/dL (ref 12.0–15.0)
Immature Granulocytes: 1 %
Lymphocytes Relative: 29 %
Lymphs Abs: 2.2 10*3/uL (ref 0.7–4.0)
MCH: 29.1 pg (ref 26.0–34.0)
MCHC: 31.6 g/dL (ref 30.0–36.0)
MCV: 92.3 fL (ref 80.0–100.0)
Monocytes Absolute: 0.8 10*3/uL (ref 0.1–1.0)
Monocytes Relative: 10 %
Neutro Abs: 4.2 10*3/uL (ref 1.7–7.7)
Neutrophils Relative %: 56 %
Platelets: 228 10*3/uL (ref 150–400)
RBC: 4.26 MIL/uL (ref 3.87–5.11)
RDW: 13.3 % (ref 11.5–15.5)
WBC: 7.5 10*3/uL (ref 4.0–10.5)
nRBC: 0 % (ref 0.0–0.2)

## 2019-04-05 LAB — BASIC METABOLIC PANEL
Anion gap: 10 (ref 5–15)
BUN: 16 mg/dL (ref 8–23)
CO2: 23 mmol/L (ref 22–32)
Calcium: 8.7 mg/dL — ABNORMAL LOW (ref 8.9–10.3)
Chloride: 107 mmol/L (ref 98–111)
Creatinine, Ser: 0.79 mg/dL (ref 0.44–1.00)
GFR calc Af Amer: >60 mL/min (ref >60)
GFR calc non Af Amer: >60 mL/min (ref >60)
Glucose, Bld: 150 mg/dL — ABNORMAL HIGH (ref 70–99)
Potassium: 3.3 mmol/L — ABNORMAL LOW (ref 3.5–5.1)
Sodium: 140 mmol/L (ref 135–145)

## 2019-04-05 LAB — GLUCOSE, CAPILLARY
Glucose-Capillary: 139 mg/dL — ABNORMAL HIGH (ref 70–99)
Glucose-Capillary: 183 mg/dL — ABNORMAL HIGH (ref 70–99)

## 2019-04-05 LAB — PROTIME-INR
INR: 1.8 — ABNORMAL HIGH (ref 0.8–1.2)
Prothrombin Time: 20.3 seconds — ABNORMAL HIGH (ref 11.4–15.2)

## 2019-04-05 MED ORDER — CIPROFLOXACIN HCL 500 MG PO TABS: 500.0000 mg | ORAL_TABLET | Freq: Two times a day (BID) | ORAL | 0 refills | Status: AC

## 2019-04-05 MED ORDER — POLYETHYLENE GLYCOL 3350 17 G PO PACK: 17.0000 g | PACK | Freq: Two times a day (BID) | ORAL | 0 refills | Status: DC

## 2019-04-05 MED ORDER — HEPARIN (PORCINE) 25000 UT/250ML-% IV SOLN
1050.0000 [IU]/h | INTRAVENOUS | Status: DC
  Filled 2019-04-05: qty 250

## 2019-04-05 MED ORDER — METFORMIN HCL 500 MG PO TABS: 500.0000 mg | ORAL_TABLET | Freq: Two times a day (BID) | ORAL | 0 refills | Status: DC

## 2019-04-05 MED ORDER — BLOOD GLUCOSE METER KIT: PACK | 0 refills | Status: DC

## 2019-04-05 MED ORDER — METRONIDAZOLE 500 MG PO TABS
500.0000 mg | ORAL_TABLET | Freq: Three times a day (TID) | ORAL | Status: DC
  Administered 2019-04-05: 500 mg via ORAL
  Filled 2019-04-05: qty 1

## 2019-04-05 MED ORDER — CIPROFLOXACIN HCL 500 MG PO TABS
500.0000 mg | ORAL_TABLET | Freq: Two times a day (BID) | ORAL | Status: DC
  Administered 2019-04-05: 500 mg via ORAL
  Filled 2019-04-05: qty 1

## 2019-04-05 MED ORDER — METRONIDAZOLE 500 MG PO TABS: 500.0000 mg | ORAL_TABLET | Freq: Three times a day (TID) | ORAL | 0 refills | Status: AC

## 2019-04-05 MED ORDER — POTASSIUM CHLORIDE 20 MEQ PO PACK
40.0000 meq | PACK | Freq: Once | ORAL | Status: DC
  Filled 2019-04-05: qty 2

## 2019-04-05 NOTE — Progress Notes (Signed)
Patient refused to have heparin gtt started because wanted to go home. She said she wanted to speak to the MD first. Patient was discharged per MD and discharge instructions explained to patient.Next dose of medications written on her discharge sheet. Husband in to pick up patient and bring her home. Started to verify she understood how to do CBG's and stated she already knew how that she has been doing it here. Script given for Glucose kit. Discharged via wheelchair.

## 2019-04-05 NOTE — Discharge Summary (Signed)
Discharge Summary  Danielle Payne DEY:[id number] DOB: Jul 02, 1946  PCP: Danielle Nutting, DO  Admit date: 04/02/2019 Discharge date: 04/05/2019  Time spent: 35 mins  Recommendations for Outpatient Follow-up:  1. PCP in 1 week- To follow up with repeat labs and to schedule a GI appointment for colonoscopy in 4-6 weeks after resolution 2. Follow up with Gen surgery  Discharge Diagnoses:  Active Hospital Problems   Diagnosis Date Noted   Diverticulitis of colon with perforation 04/02/2019   Chronic combined systolic and diastolic heart failure (Danielle Payne) 05/14/2018   CAD (coronary artery disease) 11/23/2014   Atrial fibrillation (Danielle Payne) 11/23/2014   Essential hypertension 11/23/2014    Resolved Hospital Problems  No resolved problems to display.    Discharge Condition: Stable  Diet recommendation: As tolerated (mod carb, heart healthy)  Vitals:   04/05/19 0800 04/05/19 0842  BP: (!) 180/118 (!) 179/92  Pulse: 78   Resp:    Temp:    SpO2: 98%     History of present illness:  Danielle Norwaltis a 73 y.o.femalewith medical history significant ofchronic atrial fibrillation on Coumadin, hypertension, asthma, Hx of chronic combined systolic and diastolic CHF, CAD and hyperlipidemia presented to ER with a complaint of abdominal pain. According to patient, she has been having abdominal pain since about 2 months. The pain is located mostly in the lower abdomen but recently has gotten more pronounced on the left lower quadrant. Per patient, the pain has been intermittent, dull with no aggravating or relieving factor with the highest being about 5 out of 10 but it has gotten worse over the course of last week. She went to see her PCP last week who had a scheduled an ultrasound of abdomen for July 8 however since patient symptoms kept getting worse so she decided to come to the emergency department. She also had subjective low-grade fever at home last night along with some chills. No  sweating, chest pain, shortness of breath or any problem with urination. She has also been constipated lately. No recent travel or sick contact. In the emergency department, she was hemodynamically stable. CBC showed mild leukocytosis. BMP unremarkable except hyperglycemia. CT abdomen pelvis was done which showed colonic diverticulitis with contained perforation. EDP discussed the case with general surgery, they asked hospital service to admit.    Today, pt reported feeling much better, passing gas, having good BM. Denies any worsening abdominal pain, chest pain, SOB, N/V, fever/chills. Pt very eager to be discharged. Advised to follow up with PCP, gen surg.   Hospital Course:  Active Problems:   Atrial fibrillation (HCC)   Essential hypertension   CAD (coronary artery disease)   Chronic combined systolic and diastolic heart failure (HCC)   Diverticulitis of colon with perforation  Acute sigmoid diverticulitis with contained perforation Currently afebrile, with resolved leukocytosis CT abdomen showed colonic diverticulitis with contained perforation S/P IV Zosyn-->PO flagyl & cipro for a total of 14 days (ordered by Gen surg) Follow up with General surgery PCP to ensure pt has a colonoscopy 4-6 weeks after resolution  Newly diagnosed diabetes mellitus type 2 Last A1c in 2019 was 6.4, repeat done 04/02/2019 is 7.6 Diabetes coordinator consulted Start metformin, blood glucose kit and supplies ordered  Follow up with PCP  Chronic atrial fibrillation Rate controlled Continue Cardizem, Coreg Continue coumadin for [facility]  Hypertension Continue Cardizem, carvedilol   History of combined systolic and diastolic CHF Compensated, appears euvolemic Last echo on 05/26/18 showed EF of 65 to 70%, no wall motion abnormality  Continue home Lasix  CAD History of CABG/stents placed Currently chest pain-free  Obesity BMI of about 36 kg/m Advised lifestyle  modification             Malnutrition Type:      Malnutrition Characteristics:      Nutrition Interventions:      Estimated body mass index is 34.56 kg/m as calculated from the following:   Height as of this encounter: 5' 3" (1.6 m).   Weight as of this encounter: 88.5 kg.    Procedures:  None  Consultations:  General surgery  Discharge Exam: BP (!) 179/92 (BP Location: Right Arm)    Pulse 78    Temp 98.3 F (36.8 C) (Oral)    Resp 17    Ht 5' 3" (1.6 m)    Wt 88.5 kg    SpO2 98%    BMI 34.56 kg/m   General: NAD  Cardiovascular: S1, S2 present Respiratory: CTAB Abdomen: Soft, non-tender, non-distended, BS present  Discharge Instructions You were cared for by a hospitalist during your hospital stay. If you have any questions about your discharge medications or the care you received while you were in the hospital after you are discharged, you can call the unit and asked to speak with the hospitalist on call if the hospitalist that took care of you is not available. Once you are discharged, your primary care physician will handle any further medical issues. Please note that NO REFILLS for any discharge medications will be authorized once you are discharged, as it is imperative that you return to your primary care physician (or establish a relationship with a primary care physician if you do not have one) for your aftercare needs so that they can reassess your need for medications and monitor your lab values.  Discharge Instructions    Ambulatory referral to Nutrition and Diabetic Education   Complete by: As directed      Allergies as of 04/05/2019   No Known Allergies     Medication List    TAKE these medications   albuterol 108 (90 Base) MCG/ACT inhaler Commonly known as: VENTOLIN HFA INHALE TWO PUFFS INTO THE LUNGS EVERY 4 HOURS AS NEEDED FOR WHEEZING OR SHORTNESS OF BREATH What changed: See the new instructions.   blood glucose meter kit and  supplies Dispense based on patient and insurance preference. Use up to four times daily as directed. (FOR ICD-10 E10.9, E11.9).   carvedilol 25 MG tablet Commonly known as: COREG Take 1 tablet (25 mg total) by mouth 2 (two) times daily with a meal. What changed:   how much to take  when to take this  additional instructions   ciprofloxacin 500 MG tablet Commonly known as: CIPRO Take 1 tablet (500 mg total) by mouth 2 (two) times daily for 11 days.   Cod Liver Oil Caps Take 1 capsule by mouth daily.   diltiazem 180 MG 24 hr capsule Commonly known as: CARDIZEM CD Take 1 capsule (180 mg total) by mouth daily. What changed: additional instructions   fluticasone 50 MCG/ACT nasal spray Commonly known as: Flonase Place 2 sprays into both nostrils daily as needed for allergies or rhinitis.   furosemide 40 MG tablet Commonly known as: LASIX Take 40 mg by mouth daily as needed for fluid or edema.   loratadine 10 MG tablet Commonly known as: CLARITIN Take 10 mg by mouth at bedtime.   Melatonin 3 MG Caps Take 3 mg by mouth at bedtime.   metFORMIN  500 MG tablet Commonly known as: Glucophage Take 1 tablet (500 mg total) by mouth 2 (two) times daily with a meal for 30 days.   metroNIDAZOLE 500 MG tablet Commonly known as: FLAGYL Take 1 tablet (500 mg total) by mouth every 8 (eight) hours for 11 days.   mupirocin ointment 2 % Commonly known as: Bactroban Place 1 application into the nose 2 (two) times daily.   polyethylene glycol 17 g packet Commonly known as: MIRALAX / GLYCOLAX Take 17 g by mouth 2 (two) times daily.   warfarin 5 MG tablet Commonly known as: COUMADIN Take as directed. If you are unsure how to take this medication, talk to your nurse or doctor. Original instructions: Take 1/2 to 1 tablets daily as directed by coumadin clinic What changed:   how much to take  how to take this  when to take this  additional instructions      No Known  Allergies Follow-up Information    Danielle Nutting, DO. Schedule an appointment as soon as possible for a visit in 1 week(s).   Specialty: Family Medicine Contact information: [place] [address] [phone]        Skeet Latch, MD .   Specialty: Cardiology Contact information: [address] [place] [address] [phone]        Clovis Riley, MD Follow up.   Specialty: General Surgery Why: Contact info for the surgeon who took care of you during your hospiatl stay Contact information: [address] [place] [address] [phone]            The results of significant diagnostics from this hospitalization (including imaging, microbiology, ancillary and laboratory) are listed below for reference.    Significant Diagnostic Studies: Ct Abdomen Pelvis W Contrast  Result Date: 04/02/2019 CLINICAL DATA:  Abdominal pain EXAM: CT ABDOMEN AND PELVIS WITH CONTRAST TECHNIQUE: Multidetector CT imaging of the abdomen and pelvis was performed using the standard protocol following bolus administration of intravenous contrast. CONTRAST:  135m OMNIPAQUE IOHEXOL 300 MG/ML  SOLN COMPARISON:  None. FINDINGS: Lower chest: No acute abnormality.  Coronary artery calcifications. Hepatobiliary: No solid liver abnormality is seen. No gallstones, gallbladder wall thickening, or biliary dilatation. Pancreas: Unremarkable. No pancreatic ductal dilatation or surrounding inflammatory changes. Spleen: Normal in size without significant abnormality. Adrenals/Urinary Tract: Adrenal glands are unremarkable. Kidneys are normal, without renal calculi, solid lesion, or hydronephrosis. Bladder is unremarkable. Stomach/Bowel: Stomach is within normal limits. Appendix appears normal. There is severe descending and sigmoid diverticulosis with extensive wall thickening, fat stranding, and inflammatory fluid about the proximal sigmoid. There is  a small contained perforation and phlegmon about the posterior proximal sigmoid (series 2, image 56, series 6, image 143). Vascular/Lymphatic: Aortic atherosclerosis. No enlarged abdominal or pelvic lymph nodes. Reproductive: Status post hysterectomy. Other: No abdominal wall hernia. There is a fluid attenuation superficial subcutaneous cyst or nodule of the inframammary right chest (series 2, image 7). No abdominopelvic ascites. Musculoskeletal: No acute or significant osseous findings. IMPRESSION: 1. There is severe descending and sigmoid diverticulosis with extensive wall thickening, fat stranding, and inflammatory fluid about the proximal sigmoid. There is a small contained perforation and phlegmon about the posterior proximal sigmoid (series 2, image 56, series 6, image 143). Findings are consistent with acute diverticulitis complicated by contained perforation. No evidence of overt perforation or overt abscess formation. 2. There is a fluid attenuation superficial subcutaneous cyst or nodule of the inframammary right chest (series 2,  image 7). Correlate with physical examination for boil, inclusion cyst, or other cutaneous finding. Targeted ultrasound may be used to further characterize if desired. 3. Other chronic, incidental, and postoperative findings as detailed above. Electronically Signed   By: Eddie Candle M.D.   On: 04/02/2019 11:11    Microbiology: Recent Results (from the past 240 hour(s))  Novel Coronavirus,NAA,(SEND-OUT TO REF LAB - TAT 24-48 hrs); Hosp Order     Status: None   Collection Time: 04/02/19 11:24 AM   Specimen: Nasopharyngeal Swab; Respiratory  Result Value Ref Range Status   SARS-CoV-2, NAA NOT DETECTED NOT DETECTED Final    Comment: (NOTE) This test was developed and its performance characteristics determined by [employer]. This test has not been FDA cleared or approved. This test has been authorized by FDA under an Emergency Use Authorization (EUA). This test  is only authorized for the duration of time the declaration that circumstances exist justifying the authorization of the emergency use of in vitro diagnostic tests for detection of SARS-CoV-2 virus and/or diagnosis of COVID-19 infection under section 564(b)(1) of the Act, 21 U.S.C. 374UZH-4(U)(0), unless the authorization is terminated or revoked sooner. When diagnostic testing is negative, the possibility of a false negative result should be considered in the context of a patient's recent exposures and the presence of clinical signs and symptoms consistent with COVID-19. An individual without symptoms of COVID-19 and who is not shedding SARS-CoV-2 virus would expect to have a negative (not detected) result in this assay. Performed  At: [facility] [place], [place] [id number] Rush Farmer MD UN:[id number]    [place]  Final    Comment: Performed at [facility] [address] [place], [place] [address]     Labs: Basic Metabolic Panel: Recent Labs  Lab 04/02/19 0911 04/03/19 0521 04/04/19 0527 04/05/19 0624  NA 137 137 141 140  K 3.5 3.4* 3.4* 3.3*  CL 103 104 108 107  CO2 24 23 21* 23  GLUCOSE 215* 161* 148* 150*  BUN _0 CREATININE 0.81 1.00 0.79 0.79  CALCIUM 8.5* 8.3* 8.5* 8.7*   Liver Function Tests: Recent Labs  Lab 04/02/19 0911 04/03/19 0521  AST 14* 13*  ALT 20 18  ALKPHOS 66 52  BILITOT 1.0 1.5*  PROT 7.4 6.8  ALBUMIN 3.7 3.3*   Recent Labs  Lab 04/02/19 0911  LIPASE 23   No results for input(s): AMMONIA in the last 168 hours. CBC: Recent Labs  Lab 04/02/19 0911 04/02/19 1339 04/03/19 0521 04/04/19 0527 04/05/19 0624  WBC 15.6* 16.0* 15.0* 8.1 7.5  NEUTROABS  --   --   --  5.1 4.2  HGB 14.4 14.4 13.2 12.9 12.4  HCT 42.6 45.6 41.9 41.1 39.3  MCV 90.1 92.5 92.1 92.2 92.3  PLT 214 212 211 195 228   Cardiac Enzymes: No results for input(s): CKTOTAL, CKMB,  CKMBINDEX, TROPONINI in the last 168 hours. BNP: BNP (last 3 results) No results for input(s): BNP in the last 8760 hours.  ProBNP (last 3 results) No results for input(s): PROBNP in the last 8760 hours.  CBG: Recent Labs  Lab 04/04/19 0800 04/04/19 1129 04/04/19 1634 04/04/19 2036 04/05/19 0735  GLUCAP 152* 189* 128* 175* 139*       Signed:  Alma Friendly, MD [facility] 04/05/2019, 10:48 AM

## 2019-04-05 NOTE — Progress Notes (Addendum)
Subjective: CC: Abdominal pain Pain resolved, having bms and flatus, bloating improved.   Objective: Vital signs in last 24 hours: Temp:  [98.3 F (36.8 C)-98.5 F (36.9 C)] 98.3 F (36.8 C) (06/28 0551) Pulse Rate:  [60-83] 78 (06/28 0800) Resp:  [15-17] 17 (06/28 0551) BP: (138-180)/(71-118) 179/92 (06/28 0842) SpO2:  [98 %-100 %] 98 % (06/28 0800) Weight:  [88.5 kg] 88.5 kg (06/28 0551) Last BM Date: 04/03/19  Intake/Output from previous day: 06/27 0701 - 06/28 0700 In: 150 [IV Piggyback:150] Out: -  Intake/Output this shift: Total I/O In: 480 [P.O.:480] Out: -   PE: Gen: Awake and alert, NAD Heart: irregular rhythm, regular rate Lungs: CTA b/l, normal rate and effort Abd: Soft, minimally distended, no tenderness. Umbilical hernia is reducible.  Msk: no edema   Lab Results:  Recent Labs    04/04/19 0527 04/05/19 0624  WBC 8.1 7.5  HGB 12.9 12.4  HCT 41.1 39.3  PLT 195 228   BMET Recent Labs    04/04/19 0527 04/05/19 0624  NA 141 140  K 3.4* 3.3*  CL 108 107  CO2 21* 23  GLUCOSE 148* 150*  BUN 15 16  CREATININE 0.79 0.79  CALCIUM 8.5* 8.7*   PT/INR Recent Labs    04/04/19 0527 04/05/19 0624  LABPROT 23.1* 20.3*  INR 2.1* 1.8*   CMP     Component Value Date/Time   NA 140 04/05/2019 0624   NA 141 03/09/2019 0849   K 3.3 (L) 04/05/2019 0624   CL 107 04/05/2019 0624   CO2 23 04/05/2019 0624   GLUCOSE 150 (H) 04/05/2019 0624   BUN 16 04/05/2019 0624   BUN 15 03/09/2019 0849   CREATININE 0.79 04/05/2019 0624   CALCIUM 8.7 (L) 04/05/2019 0624   PROT 6.8 04/03/2019 0521   PROT 7.3 03/09/2019 0849   ALBUMIN 3.3 (L) 04/03/2019 0521   ALBUMIN 4.1 03/09/2019 0849   AST 13 (L) 04/03/2019 0521   ALT 18 04/03/2019 0521   ALKPHOS 52 04/03/2019 0521   BILITOT 1.5 (H) 04/03/2019 0521   BILITOT 0.5 03/09/2019 0849   GFRNONAA >60 04/05/2019 0624   GFRAA >60 04/05/2019 0624   Lipase     Component Value Date/Time   LIPASE 23  04/02/2019 0911       Studies/Results: No results found.  Anti-infectives: Anti-infectives (From admission, onward)   Start     Dose/Rate Route Frequency Ordered Stop   04/02/19 1800  piperacillin-tazobactam (ZOSYN) IVPB 3.375 g     3.375 g 12.5 mL/hr over 240 Minutes Intravenous Every 8 hours 04/02/19 1247     04/02/19 1400  piperacillin-tazobactam (ZOSYN) IVPB 3.375 g  Status:  Discontinued     3.375 g 100 mL/hr over 30 Minutes Intravenous Every 8 hours 04/02/19 1204 04/02/19 1247   04/02/19 1130  piperacillin-tazobactam (ZOSYN) IVPB 3.375 g     3.375 g 100 mL/hr over 30 Minutes Intravenous  Once 04/02/19 1123 04/02/19 1216       Assessment/Plan A. Fib -  Coumadin held  CAD status post CABG (2017) Hypertension Hyperlipidemia Asthma CHF (EF 65-70% on last echo-05/26/2018) Hyperglycemia   Acute sigmoid diverticulitis w/ contained perforation - No indication for emergent or urgent surgery - No drainable abscess on CT -  Transition to PO abx - continue soft diet - Mobilize and IS - Seems to be resolving.  Patient will need colonoscopy in 4-6 weeks after resolution.   ID - Zosyn 6/25 >> WBC  15,600->16,000->15,000, now 7.5.  VTE - SCDs, Heparin drip (not started) FEN - soft diet  Plan- Can transition to PO abx and plan DC today from surgery standpoint. Would recommend a 14 day total course of abx. She will need follow up with Gi for colonoscopy in 6-8 weeks. Will set her up with follow up with CRS in our office as well.     LOS: 3 days    [facility] 04/05/2019, 9:24 AM

## 2019-04-05 NOTE — Progress Notes (Signed)
ANTICOAGULATION CONSULT NOTE   Pharmacy Consult for Heparin Indication: chronic atrial fibrillation  No Known Allergies  Patient Measurements: Height: 5\' 3"  (160 cm) Weight: 195 lb 1.7 oz (88.5 kg) IBW/kg (Calculated) : 52.4   Vital Signs: Temp: 98.3 F (36.8 C) (06/28 0551) Temp Source: Oral (06/28 0551) BP: 138/71 (06/28 0551) Pulse Rate: 60 (06/28 0551)  Labs: Recent Labs    04/02/19 0911  04/03/19 0521 04/04/19 0527 04/05/19 0624  HGB 14.4   < > 13.2 12.9 12.4  HCT 42.6   < > 41.9 41.1 39.3  PLT 214   < > 211 195 228  LABPROT  --    < > 24.2* 23.1* 20.3*  INR  --    < > 2.2* 2.1* 1.8*  CREATININE 0.81  --  1.00 0.79  --    < > = values in this interval not displayed.    Estimated Creatinine Clearance: 65.1 mL/min (by C-G formula based on SCr of 0.79 mg/dL).   Medical History: Past Medical History:  Diagnosis Date  . Allergy   . Asthma   . Atrial fibrillation ([place])   . Chronic combined systolic and diastolic heart failure ([place]) 05/14/2018  . Coronary artery disease   . Hypertension   . Pure hypercholesterolemia 05/14/2018  . Stented coronary artery     Assessment: 73 y/o F on warfarin therapy for chronic atrial fibrillation, home dosage reported as 5 mg daily except 2.5 mg MWF with last dose taken 6/24, presented to ED on 6/25 with c/o abd pain and was found to have acute diverticulitis with contained perforation without evidence of abscess formation.  Warfarin was held in anticipation of possible surgical intervention.   Consult was received to begin heparin infusion with pharmacy dosing assistance when INR fell below 2.    Today, 04/05/2019: INR 1.8 H/H and Pltc WNL Per yesterday's MD notes patient might be ready for discharge today on oral abx.    Goal of Therapy:  Heparin level 0.3-0.7 units/ml Monitor platelets by anticoagulation protocol: Yes   Plan:  For now, begin heparin infusion at 1050 units/hr. Await MD rounds. If patient to remain  hospitalized ...    -check heparin level at 1600    -daily heparin level, INR, CBC  Clayburn Pert, PharmD, BCPS [phone] 04/05/2019  7:28 AM

## 2019-04-06 ENCOUNTER — Telehealth: Payer: Self-pay | Admitting: Family Medicine

## 2019-04-06 ENCOUNTER — Telehealth: Payer: Self-pay | Admitting: Behavioral Health

## 2019-04-06 NOTE — Telephone Encounter (Signed)
Patient and spouse had requested callback.  Patient verbalized events. I explained our review process took place today, and was in order.  Patient stated she would like to stay with PCP, but wants to discuss with spouse tonight.  I advised TOC can be a possibility, if she decides, we can initiate the process, and she can advise Edd Arbour, Production assistant, radio, when she returns Ronnie's hospitalization follow up call tomorrow.    Patient verbalized understanding, and expressed appreciation for our concern for her and follow up.

## 2019-04-06 NOTE — Telephone Encounter (Signed)
Patient voiced that she would first like to speak with her husband before scheduling a hospital follow-up visit with PCP. She will give the office a call back on tomorrow.

## 2019-04-07 NOTE — Telephone Encounter (Signed)
Patient is checking status on the call back about TOC of providers. Patient call back [phone]

## 2019-04-07 NOTE — Telephone Encounter (Signed)
Ok with me 

## 2019-04-08 ENCOUNTER — Other Ambulatory Visit: Payer: Self-pay | Admitting: Family Medicine

## 2019-04-08 ENCOUNTER — Telehealth: Payer: Self-pay | Admitting: Family Medicine

## 2019-04-08 ENCOUNTER — Telehealth: Payer: Self-pay

## 2019-04-08 DIAGNOSIS — E119 Type 2 diabetes mellitus without complications: Secondary | ICD-10-CM | POA: Insufficient documentation

## 2019-04-08 MED ORDER — ACCU-CHEK AVIVA VI STRP: ORAL_STRIP | 4 refills | Status: DC

## 2019-04-08 NOTE — Telephone Encounter (Signed)
Called patient as follow up.  She is appreciative, and confirmed appointment scheduled with Dr. Zigmund Daniel on 7/6.    In previous conversation this morning, Dr. Zigmund Daniel has verbally agreed for her to remain as his patient.  Follow up needed - she has a question - there are ultrasounds or other imaging/tests scheduled for next week.  Does she still need to keep these appointments or should she cancel since scans done while she was in the hospital?  I advised I would send this note, and she can expect an answer/follow up telephone call from [place] today.  Thank you, Danielle Payne

## 2019-04-08 NOTE — Telephone Encounter (Signed)
She does not need to have this additional imaging completed at this time.  I look forward to seeing her next week.

## 2019-04-08 NOTE — Telephone Encounter (Signed)
Pt husband came in and said he wanted to apologize for how he acted last week in the office and said that Danielle Payne would still like to stay under Dr Zigmund Daniel care and said that she needs a hospital follow up, he also said she needs to monitor her blood sugar and wanted to know if Dr Zigmund Daniel could prescribe Accu-check Avivia test strips

## 2019-04-08 NOTE — Telephone Encounter (Signed)
Tried to call Pt . LVM about prescreening/no further imaging and to check on her , to see how she is doing .

## 2019-04-08 NOTE — Telephone Encounter (Signed)
Glucose test strips ordered.  Please schedule hospital follow up w/ me.

## 2019-04-08 NOTE — Telephone Encounter (Signed)
Tried to call Pt to( LVM)  notify her of no further imagine at this time, since she had it done while admitted to the hospital. Also, inquired about how she was doing and Pre-screen for Monday appt.

## 2019-04-08 NOTE — Telephone Encounter (Signed)
Done, pt aware mask required and no visitors

## 2019-04-09 DIAGNOSIS — M9902 Segmental and somatic dysfunction of thoracic region: Secondary | ICD-10-CM | POA: Diagnosis not present

## 2019-04-09 DIAGNOSIS — M9901 Segmental and somatic dysfunction of cervical region: Secondary | ICD-10-CM | POA: Diagnosis not present

## 2019-04-09 DIAGNOSIS — M47812 Spondylosis without myelopathy or radiculopathy, cervical region: Secondary | ICD-10-CM | POA: Diagnosis not present

## 2019-04-09 DIAGNOSIS — M542 Cervicalgia: Secondary | ICD-10-CM | POA: Diagnosis not present

## 2019-04-13 ENCOUNTER — Encounter: Payer: Self-pay | Admitting: Family Medicine

## 2019-04-13 ENCOUNTER — Ambulatory Visit (INDEPENDENT_AMBULATORY_CARE_PROVIDER_SITE_OTHER): Payer: Medicare Other | Admitting: Family Medicine

## 2019-04-13 ENCOUNTER — Telehealth: Payer: Self-pay | Admitting: Family Medicine

## 2019-04-13 VITALS — BP 150/94 | HR 73 | Temp 97.2°F | Resp 18 | Ht 63.0 in | Wt 200.0 lb

## 2019-04-13 DIAGNOSIS — E119 Type 2 diabetes mellitus without complications: Secondary | ICD-10-CM | POA: Diagnosis not present

## 2019-04-13 DIAGNOSIS — K5720 Diverticulitis of large intestine with perforation and abscess without bleeding: Secondary | ICD-10-CM

## 2019-04-13 MED ORDER — GLYBURIDE 1.25 MG PO TABS: 1.2500 mg | ORAL_TABLET | Freq: Every day | ORAL | 1 refills | Status: DC

## 2019-04-13 MED ORDER — BLOOD GLUCOSE METER KIT: PACK | 0 refills | Status: DC

## 2019-04-13 NOTE — Telephone Encounter (Signed)
Pharmacy stating accu check aviva plus supplies be resent as 3 separate scipts. They can't fill it due to medicare standards. Says to keep ICD 10 code the same.

## 2019-04-13 NOTE — Patient Instructions (Signed)
Double check to see if you have an appointment with Dr. Fuller Plan with gastroenterology, if not please let me know.  I will plan to see you back in about 3 months or sooner if needed.    Diabetes Basics  Diabetes (diabetes mellitus) is a long-term (chronic) disease. It occurs when the body does not properly use sugar (glucose) that is released from food after you eat. Diabetes may be caused by one or both of these problems:  Your pancreas does not make enough of a hormone called insulin.  Your body does not react in a normal way to insulin that it makes. Insulin lets sugars (glucose) go into cells in your body. This gives you energy. If you have diabetes, sugars cannot get into cells. This causes high blood sugar (hyperglycemia). Follow these instructions at home: How is diabetes treated? You may need to take insulin or other diabetes medicines daily to keep your blood sugar in balance. Take your diabetes medicines every day as told by your doctor. List your diabetes medicines here: Diabetes medicines  Name of medicine: ______________________________ ? Amount (dose): _______________ Time (a.m./p.m.): _______________ Notes: ___________________________________  Name of medicine: ______________________________ ? Amount (dose): _______________ Time (a.m./p.m.): _______________ Notes: ___________________________________  Name of medicine: ______________________________ ? Amount (dose): _______________ Time (a.m./p.m.): _______________ Notes: ___________________________________ If you use insulin, you will learn how to give yourself insulin by injection. You may need to adjust the amount based on the food that you eat. List the types of insulin you use here: Insulin  Insulin type: ______________________________ ? Amount (dose): _______________ Time (a.m./p.m.): _______________ Notes: ___________________________________  Insulin type: ______________________________ ? Amount (dose):  _______________ Time (a.m./p.m.): _______________ Notes: ___________________________________  Insulin type: ______________________________ ? Amount (dose): _______________ Time (a.m./p.m.): _______________ Notes: ___________________________________  Insulin type: ______________________________ ? Amount (dose): _______________ Time (a.m./p.m.): _______________ Notes: ___________________________________  Insulin type: ______________________________ ? Amount (dose): _______________ Time (a.m./p.m.): _______________ Notes: ___________________________________ How do I manage my blood sugar?  Check your blood sugar levels using a blood glucose monitor as directed by your doctor. Your doctor will set treatment goals for you. Generally, you should have these blood sugar levels:  Before meals (preprandial): 80-130 mg/dL (4.4-7.2 mmol/L).  After meals (postprandial): below 180 mg/dL (10 mmol/L).  A1c level: less than 7%. Write down the times that you will check your blood sugar levels: Blood sugar checks  Time: _______________ Notes: ___________________________________  Time: _______________ Notes: ___________________________________  Time: _______________ Notes: ___________________________________  Time: _______________ Notes: ___________________________________  Time: _______________ Notes: ___________________________________  Time: _______________ Notes: ___________________________________  What do I need to know about low blood sugar? Low blood sugar is called hypoglycemia. This is when blood sugar is at or below 70 mg/dL (3.9 mmol/L). Symptoms may include:  Feeling: ? Hungry. ? Worried or nervous (anxious). ? Sweaty and clammy. ? Confused. ? Dizzy. ? Sleepy. ? Sick to your stomach (nauseous).  Having: ? A fast heartbeat. ? A headache. ? A change in your vision. ? Tingling or no feeling (numbness) around the mouth, lips, or tongue. ? Jerky movements that you cannot  control (seizure).  Having trouble with: ? Moving (coordination). ? Sleeping. ? Passing out (fainting). ? Getting upset easily (irritability). Treating low blood sugar To treat low blood sugar, eat or drink something sugary right away. If you can think clearly and swallow safely, follow the 15:15 rule:  Take 15 grams of a fast-acting carb (carbohydrate). Talk with your doctor about how much you should take.  Some fast-acting carbs are: ? Sugar tablets (  glucose pills). Take 3-4 glucose pills. ? 6-8 pieces of hard candy. ? 4-6 oz (120-150 mL) of fruit juice. ? 4-6 oz (120-150 mL) of regular (not diet) soda. ? 1 Tbsp (15 mL) honey or sugar.  Check your blood sugar 15 minutes after you take the carb.  If your blood sugar is still at or below 70 mg/dL (3.9 mmol/L), take 15 grams of a carb again.  If your blood sugar does not go above 70 mg/dL (3.9 mmol/L) after 3 tries, get help right away.  After your blood sugar goes back to normal, eat a meal or a snack within 1 hour. Treating very low blood sugar If your blood sugar is at or below 54 mg/dL (3 mmol/L), you have very low blood sugar (severe hypoglycemia). This is an emergency. Do not wait to see if the symptoms will go away. Get medical help right away. Call your local emergency services (911 in the [place] Do not drive yourself to the hospital. Questions to ask your health care provider  Do I need to meet with a diabetes educator?  What equipment will I need to care for myself at home?  What diabetes medicines do I need? When should I take them?  How often do I need to check my blood sugar?  What number can I call if I have questions?  When is my next doctor's visit?  Where can I find a support group for people with diabetes? Where to find more information  American Diabetes Association: www.diabetes.org  American Association of Diabetes Educators: www.diabeteseducator.org/patient-resources Contact a doctor if:  Your  blood sugar is at or above 240 mg/dL (13.3 mmol/L) for 2 days in a row.  You have been sick or have had a fever for 2 days or more, and you are not getting better.  You have any of these problems for more than 6 hours: ? You cannot eat or drink. ? You feel sick to your stomach (nauseous). ? You throw up (vomit). ? You have watery poop (diarrhea). Get help right away if:  Your blood sugar is lower than 54 mg/dL (3 mmol/L).  You get confused.  You have trouble: ? Thinking clearly. ? Breathing. Summary  Diabetes (diabetes mellitus) is a long-term (chronic) disease. It occurs when the body does not properly use sugar (glucose) that is released from food after digestion.  Take insulin and diabetes medicines as told.  Check your blood sugar every day, as often as told.  Keep all follow-up visits as told by your doctor. This is important. This information is not intended to replace advice given to you by your health care provider. Make sure you discuss any questions you have with your health care provider. Document Released: 12/27/2017 Document Revised: 11/14/2018 Document Reviewed: 12/27/2017 Elsevier Patient Education  2020 [employer].

## 2019-04-13 NOTE — Telephone Encounter (Signed)
PT HUSBAND CAME IN AND ADDRESSED CONCERN ABOUT MEDICATION PRESCRIBED FOR WIFE BECAUSE HE WAS PUT ON IT BEFORE AS WELL AND HE GAINED WEIGHT FROM IT, HE WAS TALKING ABOUT GLYBURIDE AND HE INSISTED I RELAY THE MESSAGE TO DR MATTHEWS, HE WANTED TO KNOW IF SHE COULD GET PUT ON GLIMEPIRIDE

## 2019-04-14 ENCOUNTER — Other Ambulatory Visit: Payer: Self-pay | Admitting: Family Medicine

## 2019-04-14 LAB — BASIC METABOLIC PANEL
BUN: 23 mg/dL (ref 7–25)
CO2: 22 mmol/L (ref 20–32)
Calcium: 9.1 mg/dL (ref 8.6–10.4)
Chloride: 101 mmol/L (ref 98–110)
Creat: 0.88 mg/dL (ref 0.60–0.93)
Glucose, Bld: 137 mg/dL — ABNORMAL HIGH (ref 65–99)
Potassium: 4.2 mmol/L (ref 3.5–5.3)
Sodium: 135 mmol/L (ref 135–146)

## 2019-04-14 LAB — CBC WITH DIFFERENTIAL/PLATELET
Absolute Monocytes: 791 cells/uL (ref 200–950)
Basophils Absolute: 83 cells/uL (ref 0–200)
Basophils Relative: 0.9 %
Eosinophils Absolute: 184 cells/uL (ref 15–500)
Eosinophils Relative: 2 %
HCT: 42 % (ref 35.0–45.0)
Hemoglobin: 14.1 g/dL (ref 11.7–15.5)
Lymphs Abs: 2447 cells/uL (ref 850–3900)
MCH: 29.5 pg (ref 27.0–33.0)
MCHC: 33.6 g/dL (ref 32.0–36.0)
MCV: 87.9 fL (ref 80.0–100.0)
MPV: 11.6 fL (ref 7.5–12.5)
Monocytes Relative: 8.6 %
Neutro Abs: 5695 cells/uL (ref 1500–7800)
Neutrophils Relative %: 61.9 %
Platelets: 381 10*3/uL (ref 140–400)
RBC: 4.78 10*6/uL (ref 3.80–5.10)
RDW: 13.5 % (ref 11.0–15.0)
Total Lymphocyte: 26.6 %
WBC: 9.2 10*3/uL (ref 3.8–10.8)

## 2019-04-14 LAB — MICROALBUMIN / CREATININE URINE RATIO
Creatinine, Urine: 53 mg/dL (ref 20–275)
Microalb Creat Ratio: 6 mcg/mg creat (ref <30)
Microalb, Ur: 0.3 mg/dL

## 2019-04-14 MED ORDER — ACCU-CHEK AVIVA VI STRP: ORAL_STRIP | 4 refills | Status: DC

## 2019-04-14 MED ORDER — GLIMEPIRIDE 1 MG PO TABS: 1.0000 mg | ORAL_TABLET | Freq: Every day | ORAL | 1 refills | Status: DC

## 2019-04-14 MED ORDER — ACCU-CHEK FASTCLIX LANCETS MISC: 3 refills | Status: DC

## 2019-04-14 MED ORDER — ACCU-CHEK AVIVA DEVI: 0 refills | Status: DC

## 2019-04-14 NOTE — Telephone Encounter (Signed)
Will switch to glimepiride.

## 2019-04-14 NOTE — Telephone Encounter (Signed)
Prescriptions sent

## 2019-04-15 ENCOUNTER — Other Ambulatory Visit: Payer: Medicare Other

## 2019-04-15 ENCOUNTER — Encounter: Payer: Self-pay | Admitting: Family Medicine

## 2019-04-15 NOTE — Assessment & Plan Note (Signed)
-  New diagnosis. -Stopped metformin, would prefer to continue on glyburide as this is working well for her.   -Discussed following low carb diet.  -F/u in 3 months.

## 2019-04-15 NOTE — Progress Notes (Signed)
Glucose levels have improved some from previous check.  Recommend low carbohydrate diet and continuation of glimepiride.  Other labs look good, WBC count is stable.

## 2019-04-15 NOTE — Assessment & Plan Note (Signed)
-  Stable symptoms, update bmp and cbc today.  -She will complete course of cipro and flagyl -Has date written down to follow up with Dr. Fuller Plan in two weeks as she will need follow up colonoscopy in about 6 weeks.  -Discussed red flag symptoms.

## 2019-04-15 NOTE — Progress Notes (Signed)
Nanette Wirsing - 73 y.o. female MRN [id number]  Date of birth: [birth year]  Subjective Chief Complaint  Patient presents with  . Hospitalization Follow-up    Diverticulitis flare up L side, Needs meter kit/teststrips/lancets.    HPI Agam Tuohy is a 73 y.o. female her today for hospital follow up.  She has had vague, non-localized abdominal pain for the past several weeks.  Scheduled to have [place] next week for further evaluation however had worsening pain after a trip to [place].  Upon return her pain had localized to the LLQ and worsened.  She also had developed low grade fever and chills.  In the emergency room her labs showed an elevated WBC and CT showed diverticulitis with contained perforation of the proximal sigmoid.  She was admitted and started on cipro/flagyl.  She has a couple of more days of this course of antibiotics.  Surgery was consulted however she did not require surgical intervention.  Her pain improved with antibiotics and she denies pain today.  Her appetite is good.  She does have some mild fatigue.  She denies fever, chills, or nausea.    During her hospitalization a repeat a1c was done and she was found to have developed diabetes.  She was started on metformin however she has stopped taking this because she is concerned about side effects.  She reports that she is taking her husbands glyburide and this is much more tolerable for her.  Her blood sugars are improving with readings around 150.  She does need a prescription for testing supplies.  She denies symptoms of hypoglycemia.   ROS:  A comprehensive ROS was completed and negative except as noted per HPI  No Known Allergies  Past Medical History:  Diagnosis Date  . Allergy   . Asthma   . Atrial fibrillation ([place])   . 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 Coronary artery disease   . Hypertension   . Pure hypercholesterolemia 05/14/2018  . Stented coronary artery     Past  Surgical History:  Procedure Laterality Date  . ABDOMINAL HYSTERECTOMY    . CARDIAC CATHETERIZATION    . CARDIOVERSION N/A 12/21/2014   Procedure: CARDIOVERSION;  Surgeon: Adrian Prows, MD;  Location: [facility];  Service: Cardiovascular;  Laterality: N/A;  . CORONARY ANGIOPLASTY    . CORONARY ARTERY BYPASS GRAFT      Social History   Socioeconomic History  . Marital status: Married    Spouse name: Not on file  . Number of children: Not on file  . Years of education: Not on file  . Highest education level: Not on file  Occupational History  . Not on file  Social Needs  . Financial resource strain: Not hard at all  . Food insecurity    Worry: Never true    Inability: Never true  . Transportation needs    Medical: No    Non-medical: No  Tobacco Use  . Smoking status: Never Smoker  . Smokeless tobacco: Never Used  Substance and Sexual Activity  . Alcohol use: No  . Drug use: No  . Sexual activity: Yes    Birth control/protection: None  Lifestyle  . Physical activity    Days per week: Not on file    Minutes per session: Not on file  . Stress: Only a little  Relationships  . Social Herbalist on phone: Three times a week    Gets together: Three times a week  Attends religious service: Not on file    Active member of club or organization: Not on file    Attends meetings of clubs or organizations: Not on file    Relationship status: Married  Other Topics Concern  . Not on file  Social History Narrative  . Not on file    Family History  Problem Relation Age of Onset  . CAD Mother   . Stroke Mother   . Hypertension Mother   . Prostate cancer Father   . Diabetes Mellitus II Sister   . Diabetes Mellitus II Brother   . CAD Maternal Aunt     Health Maintenance  Topic Date Due  . Hepatitis C Screening  [birth year]  . FOOT EXAM  07/24/1954  . OPHTHALMOLOGY EXAM  07/24/1954  . MAMMOGRAM  07/24/1994  . COLONOSCOPY  07/24/1994  . DEXA SCAN  07/24/2009   . PNA vac Low Risk Adult (1 of 2 - PCV13) 07/24/2009  . TETANUS/TDAP  07/07/2018  . INFLUENZA VACCINE  05/09/2019  . HEMOGLOBIN A1C  10/02/2019  . URINE MICROALBUMIN  04/12/2020    ----------------------------------------------------------------------------------------------------------------------------------------------------------------------------------------------------------------- Physical Exam BP (!) 150/94   Pulse 73   Temp (!) 97.2 F (36.2 C) (Oral)   Resp 18   Ht _0  (1.6 m)   Wt 200 lb (90.7 kg)   SpO2 98%   BMI 35.43 kg/m   Physical Exam Constitutional:      Appearance: Normal appearance.  HENT:     Head: Normocephalic and atraumatic.     Mouth/Throat:     Mouth: Mucous membranes are moist.  Eyes:     General: No scleral icterus. Neck:     Musculoskeletal: Neck supple.  Cardiovascular:     Rate and Rhythm: Normal rate and regular rhythm.  Pulmonary:     Effort: Pulmonary effort is normal.     Breath sounds: Normal breath sounds.  Abdominal:     General: Abdomen is flat. There is no distension.     Comments: Mild llq tenderness   Skin:    General: Skin is warm and dry.  Neurological:     General: No focal deficit present.     Mental Status: She is alert.  Psychiatric:        Mood and Affect: Mood normal.        Behavior: Behavior normal.     ------------------------------------------------------------------------------------------------------------------------------------------------------------------------------------------------------------------- Assessment and Plan  Diverticulitis of colon with perforation -Stable symptoms, update bmp and cbc today.  -She will complete course of cipro and flagyl -Has date written down to follow up with Dr. Fuller Plan in two weeks as she will need follow up colonoscopy in about 6 weeks.  -Discussed red flag symptoms.   Type 2 diabetes mellitus without complications ([place]) -New diagnosis. -Stopped metformin,  would prefer to continue on glyburide as this is working well for her.   -Discussed following low carb diet.  -F/u in 3 months.

## 2019-04-16 DIAGNOSIS — M542 Cervicalgia: Secondary | ICD-10-CM | POA: Diagnosis not present

## 2019-04-16 DIAGNOSIS — M47812 Spondylosis without myelopathy or radiculopathy, cervical region: Secondary | ICD-10-CM | POA: Diagnosis not present

## 2019-04-16 DIAGNOSIS — M9902 Segmental and somatic dysfunction of thoracic region: Secondary | ICD-10-CM | POA: Diagnosis not present

## 2019-04-16 DIAGNOSIS — M9901 Segmental and somatic dysfunction of cervical region: Secondary | ICD-10-CM | POA: Diagnosis not present

## 2019-04-23 ENCOUNTER — Other Ambulatory Visit: Payer: Self-pay | Admitting: Cardiovascular Disease

## 2019-04-27 ENCOUNTER — Telehealth

## 2019-04-27 ENCOUNTER — Telehealth: Payer: Self-pay

## 2019-04-27 NOTE — Progress Notes (Signed)
Resolve pt's anticoagulation, warfarin/INR episode via epic on 04/27/19  due to as per note on 03/06/2018, pt moved to [place].

## 2019-04-27 NOTE — Telephone Encounter (Signed)
lmom for prescreen  

## 2019-04-27 NOTE — Progress Notes (Signed)
Resolve pt's anticoagulation, warfarin/INR episode via epic on 04/27/19  due to as per note on 03/06/2018, pt moved to [place].

## 2019-04-29 ENCOUNTER — Other Ambulatory Visit: Payer: Self-pay

## 2019-04-29 ENCOUNTER — Ambulatory Visit (INDEPENDENT_AMBULATORY_CARE_PROVIDER_SITE_OTHER): Payer: Medicare Other | Admitting: Pharmacist Clinician (PhC)/ Clinical Pharmacy Specialist

## 2019-04-29 ENCOUNTER — Encounter (INDEPENDENT_AMBULATORY_CARE_PROVIDER_SITE_OTHER): Payer: Self-pay

## 2019-04-29 DIAGNOSIS — Z7901 Long term (current) use of anticoagulants: Secondary | ICD-10-CM | POA: Diagnosis not present

## 2019-04-29 DIAGNOSIS — I4821 Permanent atrial fibrillation: Secondary | ICD-10-CM | POA: Diagnosis not present

## 2019-04-29 LAB — POCT INR: INR: 2.7 (ref 2.0–3.0)

## 2019-04-30 DIAGNOSIS — M542 Cervicalgia: Secondary | ICD-10-CM | POA: Diagnosis not present

## 2019-04-30 DIAGNOSIS — M47812 Spondylosis without myelopathy or radiculopathy, cervical region: Secondary | ICD-10-CM | POA: Diagnosis not present

## 2019-04-30 DIAGNOSIS — M9902 Segmental and somatic dysfunction of thoracic region: Secondary | ICD-10-CM | POA: Diagnosis not present

## 2019-04-30 DIAGNOSIS — M9901 Segmental and somatic dysfunction of cervical region: Secondary | ICD-10-CM | POA: Diagnosis not present

## 2019-05-01 ENCOUNTER — Telehealth: Payer: Self-pay

## 2019-05-01 NOTE — Telephone Encounter (Signed)
Copied from [place] [id number]. Topic: General - Other >> May 01, 2019  8:36 AM Leward Quan A wrote: Reason for CRM: Patient called to request permission from Dr Zigmund Daniel for a [facility] of the Colon with Dr Otila Kluver in [place] Ph# [phone] also patient would like a call back at her Ph# [phone]  Called Pt to inquire what she was wanting to get done. Pt stated " needs recommendations/ medical clearance for procedure." Pt stated she has G.I . Appt at the end of the month.

## 2019-05-04 NOTE — Telephone Encounter (Signed)
I would recommend against having this done as this can often times be harmful especially with her recent condition.  I would recommend she discuss with the GI physician further as well.

## 2019-05-05 ENCOUNTER — Telehealth: Payer: Self-pay | Admitting: Gastroenterology

## 2019-05-05 NOTE — Telephone Encounter (Signed)
Disregard [facility] Admitting  Pt has office visit not a procedure.

## 2019-05-05 NOTE — Telephone Encounter (Signed)
05/05/2019 @ 9:53 am Left message on voicemail for pt to call back to confirm in person visit and do COVID Screen.       Covid-19 screening questions   Do you now or have you had a fever in the last 14 days?  Do you have any respiratory symptoms of shortness of breath or cough now or in the last 14 days?  Do you have any family members or close contacts with diagnosed or suspected Covid-19 in the past 14 days?  Have you been tested for Covid-19 and found to be positive?

## 2019-05-05 NOTE — Telephone Encounter (Signed)
Covid-19 screening questions   Do you now or have you had a fever in the last 14 days? No  Do you have any respiratory symptoms of shortness of breath or cough now or in the last 14 days? No  Do you have any family members or close contacts with diagnosed or suspected Covid-19 in the past 14 days? No  Have you been tested for Covid-19 and found to be positive? No        

## 2019-05-06 ENCOUNTER — Ambulatory Visit: Payer: Medicare Other | Admitting: Gastroenterology

## 2019-05-06 NOTE — Telephone Encounter (Signed)
Patient has no symptoms of covid-19. Patient can keep appt.

## 2019-05-07 DIAGNOSIS — M542 Cervicalgia: Secondary | ICD-10-CM | POA: Diagnosis not present

## 2019-05-07 DIAGNOSIS — M47812 Spondylosis without myelopathy or radiculopathy, cervical region: Secondary | ICD-10-CM | POA: Diagnosis not present

## 2019-05-07 DIAGNOSIS — M9902 Segmental and somatic dysfunction of thoracic region: Secondary | ICD-10-CM | POA: Diagnosis not present

## 2019-05-07 DIAGNOSIS — M9901 Segmental and somatic dysfunction of cervical region: Secondary | ICD-10-CM | POA: Diagnosis not present

## 2019-05-18 DIAGNOSIS — K5790 Diverticulosis of intestine, part unspecified, without perforation or abscess without bleeding: Secondary | ICD-10-CM | POA: Diagnosis not present

## 2019-05-21 ENCOUNTER — Encounter: Payer: Self-pay | Admitting: Physician Assistant

## 2019-06-01 ENCOUNTER — Telehealth: Payer: Self-pay | Admitting: Cardiovascular Disease

## 2019-06-01 NOTE — Telephone Encounter (Signed)
Pt with history of AF curently on diltiazem 180 mg - concerned about possible weight gain.     LMOM for patient to return call

## 2019-06-01 NOTE — Telephone Encounter (Signed)
Routed to [facility] to review med change request concerning side effects (?)

## 2019-06-01 NOTE — Telephone Encounter (Signed)
Patient currently takes CARTIA XT 180 MG 24 hr capsule she is wondering it can be changed to something else.  She is trying to lose weight, and she know that this medication makes you gain weight.

## 2019-06-02 NOTE — Telephone Encounter (Signed)
Patient on diltiazem for few years now. Formulation changed recently from diltiazem CD to [facility].   LMOM; patient to call back and discuss potential ADR.

## 2019-06-04 NOTE — Telephone Encounter (Signed)
Spoke with patient.  Explained that with her AF, we want to keep her on this medication. She would potentially do worse with a beta blocker.  Patient voiced understanding.  She is currently frustrated with her inability to lose weight. Offered her information on the [facility].

## 2019-06-09 ENCOUNTER — Ambulatory Visit: Payer: Medicare Other | Admitting: Physician Assistant

## 2019-06-10 ENCOUNTER — Other Ambulatory Visit: Payer: Self-pay

## 2019-06-10 ENCOUNTER — Ambulatory Visit (INDEPENDENT_AMBULATORY_CARE_PROVIDER_SITE_OTHER): Payer: Medicare Other | Admitting: Pharmacist Clinician (PhC)/ Clinical Pharmacy Specialist

## 2019-06-10 DIAGNOSIS — Z7901 Long term (current) use of anticoagulants: Secondary | ICD-10-CM

## 2019-06-10 DIAGNOSIS — I4821 Permanent atrial fibrillation: Secondary | ICD-10-CM | POA: Diagnosis not present

## 2019-06-10 LAB — POCT INR: INR: 1.1 — AB (ref 2.0–3.0)

## 2019-06-11 ENCOUNTER — Telehealth: Payer: Self-pay

## 2019-06-11 NOTE — Telephone Encounter (Signed)
D the pt to move 9/16 coumadin check to 2pm or 315pm b/c only 1 pharmd in the office that day and they have a lipid visit at the same time that is

## 2019-07-14 ENCOUNTER — Ambulatory Visit: Payer: Medicare Other | Admitting: Family Medicine

## 2019-07-22 DIAGNOSIS — K5792 Diverticulitis of intestine, part unspecified, without perforation or abscess without bleeding: Secondary | ICD-10-CM | POA: Diagnosis not present

## 2019-07-22 DIAGNOSIS — Z7689 Persons encountering health services in other specified circumstances: Secondary | ICD-10-CM | POA: Diagnosis not present

## 2019-07-22 DIAGNOSIS — I110 Hypertensive heart disease with heart failure: Secondary | ICD-10-CM | POA: Diagnosis not present

## 2019-07-22 DIAGNOSIS — I4891 Unspecified atrial fibrillation: Secondary | ICD-10-CM | POA: Diagnosis not present

## 2019-07-22 DIAGNOSIS — E119 Type 2 diabetes mellitus without complications: Secondary | ICD-10-CM | POA: Diagnosis not present

## 2019-07-22 DIAGNOSIS — I5042 Chronic combined systolic (congestive) and diastolic (congestive) heart failure: Secondary | ICD-10-CM | POA: Diagnosis not present

## 2019-07-24 ENCOUNTER — Encounter: Payer: Self-pay | Admitting: Gastroenterology

## 2019-08-10 ENCOUNTER — Other Ambulatory Visit: Payer: Self-pay

## 2019-08-10 ENCOUNTER — Ambulatory Visit (INDEPENDENT_AMBULATORY_CARE_PROVIDER_SITE_OTHER): Payer: Medicare Other | Admitting: Pharmacist Clinician (PhC)/ Clinical Pharmacy Specialist

## 2019-08-10 DIAGNOSIS — Z7901 Long term (current) use of anticoagulants: Secondary | ICD-10-CM | POA: Diagnosis not present

## 2019-08-10 DIAGNOSIS — I4821 Permanent atrial fibrillation: Secondary | ICD-10-CM

## 2019-08-10 LAB — POCT INR: INR: 2.6 (ref 2.0–3.0)

## 2019-08-20 DIAGNOSIS — M9902 Segmental and somatic dysfunction of thoracic region: Secondary | ICD-10-CM | POA: Diagnosis not present

## 2019-08-20 DIAGNOSIS — M47812 Spondylosis without myelopathy or radiculopathy, cervical region: Secondary | ICD-10-CM | POA: Diagnosis not present

## 2019-08-20 DIAGNOSIS — M9901 Segmental and somatic dysfunction of cervical region: Secondary | ICD-10-CM | POA: Diagnosis not present

## 2019-08-20 DIAGNOSIS — M542 Cervicalgia: Secondary | ICD-10-CM | POA: Diagnosis not present

## 2019-08-27 DIAGNOSIS — I10 Essential (primary) hypertension: Secondary | ICD-10-CM | POA: Diagnosis not present

## 2019-08-27 DIAGNOSIS — E119 Type 2 diabetes mellitus without complications: Secondary | ICD-10-CM | POA: Diagnosis not present

## 2019-08-27 DIAGNOSIS — M47812 Spondylosis without myelopathy or radiculopathy, cervical region: Secondary | ICD-10-CM | POA: Diagnosis not present

## 2019-08-27 DIAGNOSIS — M9902 Segmental and somatic dysfunction of thoracic region: Secondary | ICD-10-CM | POA: Diagnosis not present

## 2019-08-27 DIAGNOSIS — M9901 Segmental and somatic dysfunction of cervical region: Secondary | ICD-10-CM | POA: Diagnosis not present

## 2019-08-27 DIAGNOSIS — M542 Cervicalgia: Secondary | ICD-10-CM | POA: Diagnosis not present

## 2019-08-31 ENCOUNTER — Other Ambulatory Visit: Payer: Self-pay | Admitting: Cardiovascular Disease

## 2019-09-08 ENCOUNTER — Other Ambulatory Visit: Payer: Self-pay

## 2019-09-08 ENCOUNTER — Ambulatory Visit (INDEPENDENT_AMBULATORY_CARE_PROVIDER_SITE_OTHER): Payer: Medicare Other | Admitting: Pharmacist

## 2019-09-08 DIAGNOSIS — I4891 Unspecified atrial fibrillation: Secondary | ICD-10-CM

## 2019-09-08 DIAGNOSIS — Z7901 Long term (current) use of anticoagulants: Secondary | ICD-10-CM

## 2019-09-08 LAB — POCT INR: INR: 2.5 (ref 2.0–3.0)

## 2019-09-09 ENCOUNTER — Ambulatory Visit (INDEPENDENT_AMBULATORY_CARE_PROVIDER_SITE_OTHER): Payer: Medicare Other | Admitting: Gastroenterology

## 2019-09-09 ENCOUNTER — Encounter: Payer: Self-pay | Admitting: Gastroenterology

## 2019-09-09 VITALS — BP 136/68 | HR 88 | Temp 97.0°F | Ht 62.0 in | Wt 196.6 lb

## 2019-09-09 DIAGNOSIS — I2510 Atherosclerotic heart disease of native coronary artery without angina pectoris: Secondary | ICD-10-CM

## 2019-09-09 DIAGNOSIS — K5732 Diverticulitis of large intestine without perforation or abscess without bleeding: Secondary | ICD-10-CM | POA: Diagnosis not present

## 2019-09-09 DIAGNOSIS — R933 Abnormal findings on diagnostic imaging of other parts of digestive tract: Secondary | ICD-10-CM

## 2019-09-09 NOTE — Progress Notes (Signed)
History of Present Illness: This is a 73 year old female referred by Luetta Nutting, DO for the evaluation of diverticulitis and an abnormal CT of the colon.  She was hospitalized in June 2020 for acute diverticulitis with a localized perforation.  CT scan as below.  She relates progressively worsening left lower quadrant pain led to hospitalization.  All symptoms resolved with antibiotics.  She states general surgery recommended a partial colectomy to prevent a recurrence after a colonoscopy.  She has frequent constipation and takes magnesium tablets or milk of magnesia, both are effective.  No prior colonoscopy. Pt reports Cologuard was negative about 2016. Denies weight loss, abdominal pain, diarrhea, change in stool caliber, melena, hematochezia, nausea, vomiting, dysphagia, reflux symptoms, chest pain.   CT AP 04/02/2019 IMPRESSION: 1. There is severe descending and sigmoid diverticulosis with extensive wall thickening, fat stranding, and inflammatory fluid about the proximal sigmoid. There is a small contained perforation and phlegmon about the posterior proximal sigmoid (series 2, image 56, series 6, image 143). Findings are consistent with acute diverticulitis complicated by contained perforation. No evidence of overt perforation or overt abscess formation.  2. There is a fluid attenuation superficial subcutaneous cyst or nodule of the inframammary right chest (series 2, image 7). Correlate with physical examination for boil, inclusion cyst, or other cutaneous finding. Targeted ultrasound may be used to further characterize if desired.  3. Other chronic, incidental, and postoperative findings as detailed above.    No Known Allergies Outpatient Medications Prior to Visit  Medication Sig Dispense Refill  . albuterol (PROVENTIL HFA;VENTOLIN HFA) 108 (90 Base) MCG/ACT inhaler INHALE TWO PUFFS INTO THE LUNGS EVERY 4 HOURS AS NEEDED FOR WHEEZING OR SHORTNESS OF BREATH (Patient taking  differently: Inhale 2 puffs into the lungs every 4 (four) hours as needed for wheezing or shortness of breath. ) 18 g 0  . CARTIA XT 180 MG 24 hr capsule TAKE ONE CAPSULE BY MOUTH DAILY 90 capsule 3  . carvedilol (COREG) 25 MG tablet Take 1 tablet (25 mg total) by mouth 2 (two) times daily with a meal. (Patient taking differently: Take 12.5-25 mg by mouth See admin instructions. Take 25 mg by mouth in the morning and 12.5 mg in the evening) 180 tablet 2  . fluticasone (FLONASE) 50 MCG/ACT nasal spray Place 2 sprays into both nostrils daily as needed for allergies or rhinitis. 15.8 g 1  . furosemide (LASIX) 40 MG tablet Take 40 mg by mouth daily as needed for fluid or edema.     Marland Kitchen glimepiride (AMARYL) 1 MG tablet Take 1 mg by mouth daily with breakfast.    . loratadine (CLARITIN) 10 MG tablet Take 10 mg by mouth at bedtime.    . Melatonin 3 MG CAPS Take 3 mg by mouth at bedtime.     Marland Kitchen warfarin (JANTOVEN) 5 MG tablet TAKE 1/2 TO 1 TABLET BY MOUTH DAILY AS DIRECTED 90 tablet 0  . Accu-Chek FastClix Lancets MISC Use to check blood glucose daily.  Diagnosis: E11.9 100 each 3  . blood glucose meter kit and supplies Dispense based on patient and insurance preference. Use up to four times daily as directed. (FOR ICD-10 E10.9, E11.9). 1 each 0  . glimepiride (AMARYL) 1 MG tablet Take 1 tablet (1 mg total) by mouth daily with breakfast. 90 tablet 1  . Blood Glucose Monitoring Suppl (ACCU-CHEK AVIVA) device Use as instructed to check blood glucose daily.  Diagnosis: E11.9 (Patient not taking: Reported on 09/09/2019) 1 each 0  .  Cod Liver Oil CAPS Take 1 capsule by mouth daily.    Marland Kitchen glucose blood (ACCU-CHEK AVIVA) test strip Use to check glucose daily.  Diagnosis: Type 2 diabetes E11.9 (Patient not taking: Reported on 09/09/2019) 100 each 4  . mupirocin ointment (BACTROBAN) 2 % Place 1 application into the nose 2 (two) times daily. (Patient not taking: Reported on 09/09/2019) 22 g 0  . polyethylene glycol (MIRALAX  / GLYCOLAX) 17 g packet Take 17 g by mouth 2 (two) times daily. (Patient not taking: Reported on 09/09/2019) 14 each 0   No facility-administered medications prior to visit.    Past Medical History:  Diagnosis Date  . Allergy   . Asthma   . Atrial fibrillation ([place])   . 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 Coronary artery disease   . Diverticulitis   . Diverticulitis   . Hypertension   . Pure hypercholesterolemia 05/14/2018  . Stented coronary artery    Past Surgical History:  Procedure Laterality Date  . ABDOMINAL HYSTERECTOMY    . CARDIAC CATHETERIZATION    . CARDIOVERSION N/A 12/21/2014   Procedure: CARDIOVERSION;  Surgeon: Adrian Prows, MD;  Location: [facility];  Service: Cardiovascular;  Laterality: N/A;  . CORONARY ANGIOPLASTY    . CORONARY ARTERY BYPASS GRAFT     Social History   Socioeconomic History  . Marital status: Married    Spouse name: Not on file  . Number of children: Not on file  . Years of education: Not on file  . Highest education level: Not on file  Occupational History  . Not on file  Social Needs  . Financial resource strain: Not hard at all  . Food insecurity    Worry: Never true    Inability: Never true  . Transportation needs    Medical: No    Non-medical: No  Tobacco Use  . Smoking status: Never Smoker  . Smokeless tobacco: Never Used  Substance and Sexual Activity  . Alcohol use: No  . Drug use: No  . Sexual activity: Yes    Birth control/protection: None  Lifestyle  . Physical activity    Days per week: Not on file    Minutes per session: Not on file  . Stress: Only a little  Relationships  . Social Herbalist on phone: Three times a week    Gets together: Three times a week    Attends religious service: Not on file    Active member of club or organization: Not on file    Attends meetings of clubs or organizations: Not on file    Relationship status: Married  Other Topics Concern   . Not on file  Social History Narrative  . Not on file   Family History  Problem Relation Age of Onset  . CAD Mother   . Stroke Mother   . Hypertension Mother   . Prostate cancer Father   . Diabetes Mellitus II Sister   . Diabetes Mellitus II Brother   . CAD Maternal Aunt       Review of Systems: Pertinent positive and negative review of systems were noted in the above HPI section. All other review of systems were otherwise negative.   Physical Exam: General: Well developed, well nourished, no acute distress Head: Normocephalic and atraumatic Eyes:  sclerae anicteric, EOMI Ears: Normal auditory acuity Mouth: No deformity or lesions Neck: Supple, no masses or thyromegaly Lungs: Clear throughout to auscultation Heart: Regular rate and  rhythm; no murmurs, rubs or bruits Abdomen: Soft, non tender and non distended. No masses, hepatosplenomegaly or hernias noted. Normal Bowel sounds Rectal: Not done  Musculoskeletal: Symmetrical with no gross deformities  Skin: No lesions on visible extremities Pulses:  Normal pulses noted Extremities: No clubbing, cyanosis, edema or deformities noted Neurological: Alert oriented x 4, grossly nonfocal Cervical Nodes:  No significant cervical adenopathy Inguinal Nodes: No significant inguinal adenopathy Psychological:  Alert and cooperative. Normal mood and affect   Assessment and Recommendations:  1. Complicated diverticulitis, abnormal CT of colon.  Symptoms resolved following hospitalization and antibiotics.  Recommended proceeding with colonoscopy to evaluate for colorectal neoplasms and other disorders.  She is considering this option but has not decided that she is going to proceed with colonoscopy.  We discussed potential partial colectomy to resect the diseased segment and she is opposed to surgery and surgical follow-up in surgery at this time.  The risks (including bleeding, perforation, infection, missed lesions, medication reactions  and possible hospitalization or surgery if complications occur), benefits, and alternatives to colonoscopy with possible biopsy and possible polypectomy were discussed with the patient and her questions and concerns were addressed.   2.  Constipation and diverticulosis.  Long-term high-fiber diet with adequate daily fluid intake.  Magnesium tablets or milk of magnesia daily as needed for management of constipation.  Ongoing follow-up with PCP.  3.  Chronic Coumadin anticoagulation if colonoscopy is pursued Coumadin will need to be held for 5 days prior and resume following colonoscopy.   cc: Luetta Nutting, DO [address] [place],  [place] [address]

## 2019-09-09 NOTE — Patient Instructions (Signed)
It has been recommended to you by your physician that you have a(n) Colonoscopy completed. Per your request, we did not schedule the procedure(s) today. Please contact our office at [phone] should you decide to have the procedure completed. You will be scheduled for a pre-visit and procedure at that time.   High-Fiber Diet Fiber, also called dietary fiber, is a type of carbohydrate that is found in fruits, vegetables, whole grains, and beans. A high-fiber diet can have many health benefits. Your health care provider may recommend a high-fiber diet to help:  Prevent constipation. Fiber can make your bowel movements more regular.  Lower your cholesterol.  Relieve the following conditions: ? Swelling of veins in the anus (hemorrhoids). ? Swelling and irritation (inflammation) of specific areas of the digestive tract (uncomplicated diverticulosis). ? A problem of the large intestine (colon) that sometimes causes pain and diarrhea (irritable bowel syndrome, IBS).  Prevent overeating as part of a weight-loss plan.  Prevent heart disease, type 2 diabetes, and certain cancers. What is my plan? The recommended daily fiber intake in grams (g) includes:  38 g for men age 31 or younger.  30 g for men over age 56.  72 g for women age 48 or younger.  21 g for women over age 65. You can get the recommended daily intake of dietary fiber by:  Eating a variety of fruits, vegetables, grains, and beans.  Taking a fiber supplement, if it is not possible to get enough fiber through your diet. What do I need to know about a high-fiber diet?  It is better to get fiber through food sources rather than from fiber supplements. There is not a lot of research about how effective supplements are.  Always check the fiber content on the nutrition facts label of any prepackaged food. Look for foods that contain 5 g of fiber or more per serving.  Talk with a diet and nutrition specialist (dietitian) if you  have questions about specific foods that are recommended or not recommended for your medical condition, especially if those foods are not listed below.  Gradually increase how much fiber you consume. If you increase your intake of dietary fiber too quickly, you may have bloating, cramping, or gas.  Drink plenty of water. Water helps you to digest fiber. What are tips for following this plan?  Eat a wide variety of high-fiber foods.  Make sure that half of the grains that you eat each day are whole grains.  Eat breads and cereals that are made with whole-grain flour instead of refined flour or white flour.  Eat brown rice, bulgur wheat, or millet instead of white rice.  Start the day with a breakfast that is high in fiber, such as a cereal that contains 5 g of fiber or more per serving.  Use beans in place of meat in soups, salads, and pasta dishes.  Eat high-fiber snacks, such as berries, raw vegetables, nuts, and popcorn.  Choose whole fruits and vegetables instead of processed forms like juice or sauce. What foods can I eat?  Fruits Berries. Pears. Apples. Oranges. Avocado. Prunes and raisins. Dried figs. Vegetables Sweet potatoes. Spinach. Kale. Artichokes. Cabbage. Broccoli. Cauliflower. Green peas. Carrots. Squash. Grains Whole-grain breads. Multigrain cereal. Oats and oatmeal. Brown rice. Barley. Bulgur wheat. [place]. Quinoa. Bran muffins. Popcorn. Rye wafer crackers. Meats and other proteins Navy, kidney, and pinto beans. Soybeans. Split peas. Lentils. Nuts and seeds. Dairy Fiber-fortified yogurt. Beverages Fiber-fortified soy milk. Fiber-fortified orange juice. Other foods Fiber  bars. The items listed above may not be a complete list of recommended foods and beverages. Contact a dietitian for more options. What foods are not recommended? Fruits Fruit juice. Cooked, strained fruit. Vegetables Fried potatoes. Canned vegetables. Well-cooked vegetables. Grains White  bread. Pasta made with refined flour. White rice. Meats and other proteins Fatty cuts of meat. Fried chicken or fried fish. Dairy Milk. Yogurt. Cream cheese. Sour cream. Fats and oils Butters. Beverages Soft drinks. Other foods Cakes and pastries. The items listed above may not be a complete list of foods and beverages to avoid. Contact a dietitian for more information. Summary  Fiber is a type of carbohydrate. It is found in fruits, vegetables, whole grains, and beans.  There are many health benefits of eating a high-fiber diet, such as preventing constipation, lowering blood cholesterol, helping with weight loss, and reducing your risk of heart disease, diabetes, and certain cancers.  Gradually increase your intake of fiber. Increasing too fast can result in cramping, bloating, and gas. Drink plenty of water while you increase your fiber.  The best sources of fiber include whole fruits and vegetables, whole grains, nuts, seeds, and beans. This information is not intended to replace advice given to you by your health care provider. Make sure you discuss any questions you have with your health care provider. Document Released: 09/24/2005 Document Revised: 07/29/2017 Document Reviewed: 07/29/2017 Elsevier Patient Education  2020 [place] you for choosing me and [facility].  Pricilla Riffle. Dagoberto Ligas., MD., [facility] Regal

## 2019-09-22 ENCOUNTER — Ambulatory Visit: Payer: Medicare Other | Admitting: Cardiovascular Disease

## 2019-09-22 ENCOUNTER — Other Ambulatory Visit: Payer: Self-pay | Admitting: Family Medicine

## 2019-09-28 ENCOUNTER — Other Ambulatory Visit: Payer: Self-pay | Admitting: Family Medicine

## 2019-10-29 DIAGNOSIS — M47812 Spondylosis without myelopathy or radiculopathy, cervical region: Secondary | ICD-10-CM | POA: Diagnosis not present

## 2019-10-29 DIAGNOSIS — M542 Cervicalgia: Secondary | ICD-10-CM | POA: Diagnosis not present

## 2019-10-29 DIAGNOSIS — M9901 Segmental and somatic dysfunction of cervical region: Secondary | ICD-10-CM | POA: Diagnosis not present

## 2019-10-29 DIAGNOSIS — M9902 Segmental and somatic dysfunction of thoracic region: Secondary | ICD-10-CM | POA: Diagnosis not present

## 2019-11-03 ENCOUNTER — Telehealth: Payer: Self-pay | Admitting: Cardiovascular Disease

## 2019-11-03 ENCOUNTER — Other Ambulatory Visit: Payer: Self-pay

## 2019-11-03 ENCOUNTER — Ambulatory Visit (INDEPENDENT_AMBULATORY_CARE_PROVIDER_SITE_OTHER): Payer: Medicare Other | Admitting: Cardiovascular Disease

## 2019-11-03 ENCOUNTER — Encounter: Payer: Self-pay | Admitting: Cardiovascular Disease

## 2019-11-03 VITALS — BP 134/81 | HR 59 | Temp 97.1°F | Ht 62.0 in | Wt 195.2 lb

## 2019-11-03 DIAGNOSIS — E7800 Pure hypercholesterolemia, unspecified: Secondary | ICD-10-CM

## 2019-11-03 DIAGNOSIS — I10 Essential (primary) hypertension: Secondary | ICD-10-CM

## 2019-11-03 DIAGNOSIS — Z79899 Other long term (current) drug therapy: Secondary | ICD-10-CM

## 2019-11-03 DIAGNOSIS — I2510 Atherosclerotic heart disease of native coronary artery without angina pectoris: Secondary | ICD-10-CM | POA: Diagnosis not present

## 2019-11-03 DIAGNOSIS — Z9114 Patient's other noncompliance with medication regimen: Secondary | ICD-10-CM | POA: Diagnosis not present

## 2019-11-03 DIAGNOSIS — E669 Obesity, unspecified: Secondary | ICD-10-CM

## 2019-11-03 DIAGNOSIS — E1169 Type 2 diabetes mellitus with other specified complication: Secondary | ICD-10-CM

## 2019-11-03 MED ORDER — DILTIAZEM HCL ER COATED BEADS 240 MG PO CP24: 240.0000 mg | ORAL_CAPSULE | Freq: Every day | ORAL | 3 refills | Status: DC

## 2019-11-03 MED ORDER — JARDIANCE 10 MG PO TABS: 10.0000 mg | ORAL_TABLET | Freq: Every day | ORAL | 3 refills | Status: DC

## 2019-11-03 NOTE — Telephone Encounter (Signed)
Pt c/o medication issue:  1. Name of Medication: empagliflozin (JARDIANCE) 10 MG TABS tablet  2. How are you currently taking this medication (dosage and times per day)? Has not started  3. Are you having a reaction (difficulty breathing--STAT)? no  4. What is your medication issue? [facility] calling stating the patient does not have insurance and the medication is too expensive. They would like to know if there is an alternative medication.

## 2019-11-03 NOTE — Patient Instructions (Addendum)
Medication Instructions:  START- Jardiance 10 mg by mouth daily  *If you need a refill on your cardiac medications before your next appointment, please call your pharmacy*  Lab Work: Fasting Lipid and CMP  If you have labs (blood work) drawn today and your tests are completely normal, you will receive your results only by: Marland Kitchen MyChart Message (if you have MyChart) OR . A paper copy in the mail If you have any lab test that is abnormal or we need to change your treatment, we will call you to review the results.  Testing/Procedures: None Ordered  Follow-Up: At [facility], you and your health needs are our priority.  As part of our continuing mission to provide you with exceptional heart care, we have created designated Provider Care Teams.  These Care Teams include your primary Cardiologist (physician) and Advanced Practice Providers (APPs -  Physician Assistants and Nurse Practitioners) who all work together to provide you with the care you need, when you need it.  Your next appointment:   3 month(s)  The format for your next appointment:   In Person  Provider:   You may see Skeet Latch, MD or one of the following Advanced Practice Providers on your designated Care Team:    Kerin Ransom, PA-C  Arnold Line, [place]  Coletta Memos,    Other Instructions  Please consider trying Repatha or Praluent for cholesterol lowering.

## 2019-11-03 NOTE — Progress Notes (Addendum)
Virtual Visit via Video Note   This visit type was conducted due to national recommendations for restrictions regarding the COVID-19 Pandemic (e.g. social distancing) in an effort to limit this patient's exposure and mitigate transmission in our community.  Due to her co-morbid illnesses, this patient is at least at moderate risk for complications without adequate follow up.  This format is felt to be most appropriate for this patient at this time.  All issues noted in this document were discussed and addressed.  A limited physical exam was performed with this format.  Please refer to the patient's chart for her consent to telehealth for [facility].   Date:  11/03/2019   ID:  Danielle Payne, DOB September 06, 1946, MRN [id number]  Patient Location: Home Provider Location: Home  PCP:  Berkley Harvey, NP  Cardiologist:  Skeet Latch, MD  Electrophysiologist:  None   Evaluation Performed:  Follow-Up Visit  Chief Complaint:  CAD  History of Present Illness:    Danielle Payne is a 74 y.o. female with CAD s/p CABG (LIMA-->LAD, SVG-->OM, SVG-->PDA), chronic systolic and diastolic heart failure (systolic dysfunction resolved), hypertension, persistent atrial fibrillation, diabetes, and asthma here for follow up.  She was initially seen 05/2018 to establish care.  She was first diagnosed with atrial fibrillation in 2010 when she presented with afib with RVR.  She had an NSTEMI and had a Promus DES to the LCx.  She had a recurrent episode of symptomatic atrial fibrillation 03/2011.  She also required DCCV with Dr. Nadyne Coombes 12/2014.  She presented with recurrent NSTEMI  09/2016 and underwent 3 vessel CABG 09/25/16.  That surgery was complicated by recurrent pleural effusions that required thoracentesis.  Echo at that time revealed LVEF 35-40%.  Since that time she has felt well.  She has occasional heart fluttering that is not sustained.  When she moved back from [place] she decided to re-establish with [facility].  Ms. Blitzer had an echo 05/2018 that revealed an improvement in her LVEF 65 to 70%.    At her last appointment Ms. Franzen was not willing to take statins because she is afraid of potential harm.  She was admitted 11/2018 with influenza.  This was complicated by atrial fibrillation with rapid ventricular response.  Her heart rate improved with control of her respiratory symptoms.  She spoke with our pharmacist and it was recommended that she be seen by [facility].  This has not yet occurred.  She purchased a Midwife for Christmas.  She uses it 3-4 times a day for about 3 to 4 minutes each time.  She has no exertional chest pain or shortness of breath.  At home her blood pressures running around 140s over 80s.  Sometimes it is a little higher.  She sees a Theatre stage manager doctor who recommended that Coumadin be discontinued because it "stiffens her arteries."  She is taking a natural blood thinner instead.  She also is using a natural supplement for cholesterol.  She has diverticulosis and they made some adjustments to her diet which seems to have helped.  She has not been taking anything prescribed for diabetes.  She was seen Eldridge Abrahams but when she decided not to take Metformin because other family members had not tolerated that she decided not to follow back up with Ms. Ronnald Ramp.  She wonders if she can take Jardiance   The patient does not have symptoms concerning for COVID-19 infection (fever, chills, cough, or new shortness of breath).  Past Medical History:  Diagnosis Date  . Allergy   . Asthma   . Atrial fibrillation ([place])   . Chronic combined systolic and diastolic heart failure ([place]) 05/14/2018  . Coronary artery disease   . Diverticulitis   . Diverticulitis   . Hypertension   . Pure hypercholesterolemia 05/14/2018  . Stented coronary artery    Past Surgical History:  Procedure Laterality Date  . ABDOMINAL HYSTERECTOMY    . CARDIAC CATHETERIZATION     . CARDIOVERSION N/A 12/21/2014   Procedure: CARDIOVERSION;  Surgeon: Adrian Prows, MD;  Location: [facility];  Service: Cardiovascular;  Laterality: N/A;  . CORONARY ANGIOPLASTY    . CORONARY ARTERY BYPASS GRAFT       Current Meds  Medication Sig  . albuterol (PROVENTIL HFA;VENTOLIN HFA) 108 (90 Base) MCG/ACT inhaler INHALE TWO PUFFS INTO THE LUNGS EVERY 4 HOURS AS NEEDED FOR WHEEZING OR SHORTNESS OF BREATH (Patient taking differently: Inhale 2 puffs into the lungs every 4 (four) hours as needed for wheezing or shortness of breath. )  . carvedilol (COREG) 25 MG tablet Take 1 tablet(25 mg) in the morning and 1/2 tablet(12.5 mg) in the evening  . fluticasone (FLONASE) 50 MCG/ACT nasal spray Place 2 sprays into both nostrils daily as needed for allergies or rhinitis.  . furosemide (LASIX) 40 MG tablet Take 40 mg by mouth daily as needed for fluid or edema.   Marland Kitchen glimepiride (AMARYL) 1 MG tablet Take 1 mg by mouth daily with breakfast.  . loratadine (CLARITIN) 10 MG tablet Take 10 mg by mouth at bedtime.  . Melatonin 3 MG CAPS Take 3 mg by mouth at bedtime.   . [DISCONTINUED] CARTIA XT 180 MG 24 hr capsule TAKE ONE CAPSULE BY MOUTH DAILY     Allergies:   Patient has no known allergies.   Social History   Tobacco Use  . Smoking status: Never Smoker  . Smokeless tobacco: Never Used  Substance Use Topics  . Alcohol use: No  . Drug use: No     Family Hx: The patient's family history includes CAD in her maternal aunt and mother; Diabetes Mellitus II in her brother and sister; Hypertension in her mother; Prostate cancer in her father; Stroke in her mother.  ROS:   Please see the history of present illness.     All other systems reviewed and are negative.   Prior CV studies:   The following studies were reviewed today:  [place] 11/12/08: 90% mLCx.  X/p 3.0 28 mm Promus DES to mid Lcx.  LVEF 68^ Stress echo 09/01/2009: 7 min 16s on [facility] protocol.  LVEF 60-65%.  No ischemia. Echo 12/2009:  LVEF 65-70%.  Moderate LVH.  Mild LVOT obstruction. Echo 04/03/11: LVEF 65-70%.  Moderate LVH.  RVSP 20 mmHg  Echo 05/13/18: Study Conclusions  - Left ventricle: The cavity size was normal. Wall thickness was increased in a pattern of mild LVH. Systolic function was vigorous. The estimated ejection fraction was in the range of 65% to 70%. Wall motion was normal; there were no regional wall motion abnormalities. - Mitral valve: Calcified annulus. There was trivial regurgitation. - Right ventricle: The cavity size was mildly dilated. Wall thickness was normal. - Right atrium: The atrium was mildly dilated.  Labs/Other Tests and Data Reviewed:    EKG:  An ECG dated 11/20/18 was personally reviewed today and demonstrated:  atrial fibrillation.  Rate 157 bpm.  Non-specific ST changes  11/03/19: Atrial fibrillation.  Rate 59 bpm.  LVH.  Prior inferior infarct Recent Labs: 11/21/2018: Magnesium 2.0 04/03/2019: ALT 18 04/13/2019: BUN 23; Creat 0.88; Hemoglobin 14.1; Platelets 381; Potassium 4.2; Sodium 135   Recent Lipid Panel Lab Results  Component Value Date/Time   CHOL 216 (H) 03/09/2019 08:49 AM   TRIG 262 (H) 03/09/2019 08:49 AM   HDL 34 (L) 03/09/2019 08:49 AM   CHOLHDL 6.4 (H) 03/09/2019 08:49 AM   LDLCALC 130 (H) 03/09/2019 08:49 AM    Wt Readings from Last 3 Encounters:  11/03/19 195 lb 3.2 oz (88.5 kg)  09/09/19 196 lb 9.6 oz (89.2 kg)  04/13/19 200 lb (90.7 kg)     Objective:    VS:  BP 134/81   Pulse (!) 59   Temp (!) 97.1 F (36.2 C)   Ht 5\' 2"  (1.575 m)   Wt 195 lb 3.2 oz (88.5 kg)   SpO2 97%   BMI 35.70 kg/m  , BMI Body mass index is 35.7 kg/m. GENERAL:  Well appearing HEENT: Pupils equal round and reactive, fundi not visualized, oral mucosa unremarkable NECK:  No jugular venous distention, waveform within normal limits, carotid upstroke brisk and symmetric, no bruits, no thyromegaly LYMPHATICS:  No cervical adenopathy LUNGS:  Clear to auscultation  bilaterally HEART:  Irregularly irregular.  PMI not displaced or sustained,S1 and S2 within normal limits, no S3, no S4, no clicks, no rubs, no murmurs ABD:  Flat, positive bowel sounds normal in frequency in pitch, no bruits, no rebound, no guarding, no midline pulsatile mass, no hepatomegaly, no splenomegaly EXT:  2 plus pulses throughout, no edema, no cyanosis no clubbing SKIN:  No rashes no nodules NEURO:  Cranial nerves II through XII grossly intact, motor grossly intact throughout PSYCH:  Cognitively intact, oriented to person place and time   ASSESSMENT & PLAN:    # Chronic systolic and diastolic heart failure: Ms. Wattley is doing well and has no heart failure symptoms.  She had an echo at the time of her NSTEMI at which time her LVEF was 35-40%.  It has improved to 65-70% 05/2018.  Continue carvedilol and furosemide.  # CAD s/p NSTEMI/PCI/CABG: # Hyperlipidemia: No recent symptoms.  She continues to refuse statins. She has been working on diet and taking a supplement.  We will check her levels today.  Ideally she would be on a statin with an LDL less than 70.  We discussed PCSK9 inhibitors today.  She is going to do some research on them.  We talked about the fact that if she does resume warfarin we will need to closely monitor any supplements she is taking.  # Essential hypertension: Blood pressure is very slightly above goal.  She just started exercising.  She is going to work on increasing this and limiting her salt intake.  Continue carvedilol and diltiazem.  Goal is less than 130/80  # Longstanding persistent atrial fibrillation:  Rate well-controlled.  Continue diltiazem and carvedilol.  She decided to stop warfarin due to concern that it was stiffening her arteries.  She is unwilling to take Eliquis or Xarelto because she does not know "what is in them."  She is taking a natural blood thinner sold over-the-counter upon the recommendation of her herbalist.  We discussed the fact  that there are no studies showing that these natural blood thinners are effective at preventing strokes.  I am unable to recommend that she continue with this strategy.  I strongly urged her to consider prescribed anticoagulant.  She is going to see if her  herbalist can provide her with any studies showing reduction in mortality or stroke.  She will consider resuming warfarin.  She decides to do this would be more than happy to resume  # DM:  She should be on metformin or at least try it.  She is unwilling to do so.  I will give her a prescription for Jardiance.  She does not want to take glimepiride anymore.  I discussed the fact that she needs to get a primary care physician as I am not comfortable being the primary person controlling her diabetes.  I did recommend that she see Dr. Glendale Chard, and she may be able to help bridge the gap as she is quite knowledgeable with supplements and traditional Western medicine.    COVID-19 Education: The signs and symptoms of COVID-19 were discussed with the patient and how to seek care for testing (follow up with PCP or arrange E-visit).  The importance of social distancing was discussed today.  Time:   Today, I have spent 30 minutes with the patient with telehealth technology discussing the above problems.     Medication Adjustments/Labs and Tests Ordered: Current medicines are reviewed at length with the patient today.  Concerns regarding medicines are outlined above.   Tests Ordered: Orders Placed This Encounter  Procedures  . Lipid panel  . Comprehensive Metabolic Panel (CMET)  . Ambulatory referral to Internal Medicine    Medication Changes: Meds ordered this encounter  Medications  . empagliflozin (JARDIANCE) 10 MG TABS tablet    Sig: Take 10 mg by mouth daily before breakfast.    Dispense:  30 tablet    Refill:  3  . diltiazem (CARDIZEM CD) 240 MG 24 hr capsule    Sig: Take 1 capsule (240 mg total) by mouth daily.    Dispense:  90  capsule    Refill:  3    Disposition:  Follow up in 6 month(s)  Signed, Skeet Latch, MD  11/03/2019 5:56 PM    [place]

## 2019-11-04 ENCOUNTER — Telehealth: Payer: Self-pay

## 2019-11-04 NOTE — Telephone Encounter (Signed)
Metformin, which she does not want.  She can apply for patient assistance for Jardiance.  Otherwise, she needs to follow up with PCP as we do not treat diabetes.

## 2019-11-04 NOTE — Telephone Encounter (Signed)
lmom for overdue inr 

## 2019-11-05 DIAGNOSIS — Z79899 Other long term (current) drug therapy: Secondary | ICD-10-CM | POA: Diagnosis not present

## 2019-11-05 DIAGNOSIS — E7800 Pure hypercholesterolemia, unspecified: Secondary | ICD-10-CM | POA: Diagnosis not present

## 2019-11-05 LAB — COMPREHENSIVE METABOLIC PANEL
ALT: 15 IU/L (ref 0–32)
AST: 15 IU/L (ref 0–40)
Albumin/Globulin Ratio: 1.4 (ref 1.2–2.2)
Albumin: 4.3 g/dL (ref 3.7–4.7)
Alkaline Phosphatase: 97 IU/L (ref 39–117)
BUN/Creatinine Ratio: 18 (ref 12–28)
BUN: 20 mg/dL (ref 8–27)
Bilirubin Total: 0.6 mg/dL (ref 0.0–1.2)
CO2: 21 mmol/L (ref 20–29)
Calcium: 9.5 mg/dL (ref 8.7–10.3)
Chloride: 100 mmol/L (ref 96–106)
Creatinine, Ser: 1.09 mg/dL — ABNORMAL HIGH (ref 0.57–1.00)
GFR calc Af Amer: 57 mL/min/{1.73_m2} — ABNORMAL LOW (ref >59)
GFR calc non Af Amer: 50 mL/min/{1.73_m2} — ABNORMAL LOW (ref >59)
Globulin, Total: 3.1 g/dL (ref 1.5–4.5)
Glucose: 187 mg/dL — ABNORMAL HIGH (ref 65–99)
Potassium: 4.7 mmol/L (ref 3.5–5.2)
Sodium: 139 mmol/L (ref 134–144)
Total Protein: 7.4 g/dL (ref 6.0–8.5)

## 2019-11-05 LAB — LIPID PANEL
Chol/HDL Ratio: 4.9 ratio — ABNORMAL HIGH (ref 0.0–4.4)
Cholesterol, Total: 191 mg/dL (ref 100–199)
HDL: 39 mg/dL — ABNORMAL LOW (ref >39)
LDL Chol Calc (NIH): 119 mg/dL — ABNORMAL HIGH (ref 0–99)
Triglycerides: 187 mg/dL — ABNORMAL HIGH (ref 0–149)
VLDL Cholesterol Cal: 33 mg/dL (ref 5–40)

## 2019-11-05 NOTE — Telephone Encounter (Signed)
Left message to call back  

## 2019-11-11 NOTE — Addendum Note (Signed)
Addended by: Merri Ray A on: 11/11/2019 12:35 PM   Modules accepted: Orders

## 2019-11-12 ENCOUNTER — Telehealth: Payer: Self-pay

## 2019-11-12 DIAGNOSIS — M542 Cervicalgia: Secondary | ICD-10-CM | POA: Diagnosis not present

## 2019-11-12 DIAGNOSIS — M9902 Segmental and somatic dysfunction of thoracic region: Secondary | ICD-10-CM | POA: Diagnosis not present

## 2019-11-12 DIAGNOSIS — M47812 Spondylosis without myelopathy or radiculopathy, cervical region: Secondary | ICD-10-CM | POA: Diagnosis not present

## 2019-11-12 DIAGNOSIS — M9901 Segmental and somatic dysfunction of cervical region: Secondary | ICD-10-CM | POA: Diagnosis not present

## 2019-11-12 NOTE — Telephone Encounter (Signed)
LMOM FOR OVERDUE INR 

## 2019-11-13 ENCOUNTER — Telehealth: Payer: Self-pay | Admitting: *Deleted

## 2019-11-13 NOTE — Telephone Encounter (Signed)
Patient sent note with multiple questions for Dr Oval Linsey to review  Per Dr Oval Linsey blood pressure was running high in office and she was having a hard time getting the Cartia XT 180 mg. Therefore it was switched Diltiazem CD 240 mg since easier to find. It should not cause fatigue and doesn't cause kidney/liver function changes. It is broken down in the liver and excreted in bile.   Left message to call back

## 2019-11-13 NOTE — Telephone Encounter (Signed)
Patient never called back. Left detailed message Kristopher Oppenheim regarding Dr Blenda Mounts suggestion, did suggest following up with PCP

## 2019-11-18 ENCOUNTER — Other Ambulatory Visit: Payer: Self-pay

## 2019-11-18 ENCOUNTER — Encounter: Payer: Self-pay | Admitting: Internal Medicine

## 2019-11-18 ENCOUNTER — Ambulatory Visit (INDEPENDENT_AMBULATORY_CARE_PROVIDER_SITE_OTHER): Payer: Medicare Other | Admitting: Internal Medicine

## 2019-11-18 VITALS — BP 116/88 | HR 73 | Temp 97.8°F | Ht 61.8 in | Wt 195.4 lb

## 2019-11-18 DIAGNOSIS — I482 Chronic atrial fibrillation, unspecified: Secondary | ICD-10-CM

## 2019-11-18 DIAGNOSIS — Z712 Person consulting for explanation of examination or test findings: Secondary | ICD-10-CM

## 2019-11-18 DIAGNOSIS — N182 Chronic kidney disease, stage 2 (mild): Secondary | ICD-10-CM | POA: Diagnosis not present

## 2019-11-18 DIAGNOSIS — Z6835 Body mass index (BMI) 35.0-35.9, adult: Secondary | ICD-10-CM

## 2019-11-18 DIAGNOSIS — E1122 Type 2 diabetes mellitus with diabetic chronic kidney disease: Secondary | ICD-10-CM | POA: Diagnosis not present

## 2019-11-18 DIAGNOSIS — Z7901 Long term (current) use of anticoagulants: Secondary | ICD-10-CM | POA: Diagnosis not present

## 2019-11-18 DIAGNOSIS — E2839 Other primary ovarian failure: Secondary | ICD-10-CM

## 2019-11-18 DIAGNOSIS — I2510 Atherosclerotic heart disease of native coronary artery without angina pectoris: Secondary | ICD-10-CM

## 2019-11-18 DIAGNOSIS — K5720 Diverticulitis of large intestine with perforation and abscess without bleeding: Secondary | ICD-10-CM

## 2019-11-18 DIAGNOSIS — E7800 Pure hypercholesterolemia, unspecified: Secondary | ICD-10-CM | POA: Diagnosis not present

## 2019-11-18 LAB — POCT URINALYSIS DIPSTICK
Bilirubin, UA: NEGATIVE
Blood, UA: NEGATIVE
Glucose, UA: NEGATIVE
Ketones, UA: NEGATIVE
Leukocytes, UA: NEGATIVE
Nitrite, UA: NEGATIVE
Protein, UA: NEGATIVE
Spec Grav, UA: 1.01 (ref 1.010–1.025)
Urobilinogen, UA: 0.2 E.U./dL
pH, UA: 6 (ref 5.0–8.0)

## 2019-11-18 LAB — POCT UA - MICROALBUMIN
Albumin/Creatinine Ratio, Urine, POC: <30
Creatinine, POC: 200 mg/dL
Microalbumin Ur, POC: 10 mg/L

## 2019-11-19 LAB — TSH: TSH: 0.884 u[IU]/mL (ref 0.450–4.500)

## 2019-11-19 LAB — HEMOGLOBIN A1C
Est. average glucose Bld gHb Est-mCnc: 180 mg/dL
Hgb A1c MFr Bld: 7.9 % — ABNORMAL HIGH (ref 4.8–5.6)

## 2019-11-20 ENCOUNTER — Ambulatory Visit: Payer: Self-pay

## 2019-11-20 DIAGNOSIS — I482 Chronic atrial fibrillation, unspecified: Secondary | ICD-10-CM

## 2019-11-20 DIAGNOSIS — E1122 Type 2 diabetes mellitus with diabetic chronic kidney disease: Secondary | ICD-10-CM

## 2019-11-20 NOTE — Chronic Care Management (AMB) (Signed)
  Chronic Care Management   Outreach Note  11/20/2019 Name: Danielle Payne MRN: [id number] DOB: 01-28-1946  Referred by: Glendale Chard MD Reason for referral : Care Coordination   An unsuccessful telephone outreach was attempted today. The patient was referred to the case management team for assistance with care management and care coordination.   Follow Up Plan: A HIPPA compliant phone message was left for the patient providing contact information and requesting a return call.  The care management team will reach out to the patient again over the next 10 days.   Daneen Schick, BSW, CDP Social Worker, Certified Dementia Practitioner [place] [facility] [phone]

## 2019-11-24 NOTE — Telephone Encounter (Signed)
Left message to call back  

## 2019-11-24 NOTE — Telephone Encounter (Signed)
-----   Message from Skeet Latch, MD sent at 11/23/2019  4:50 PM EST ----- Cholesterol levels do look better.  However they are still higher than they should be.  I continue to recommend trying a statin to reduce her LDL to less than 70.  Recommend follow-up with our pharmacist to discuss lipid-lowering options.

## 2019-11-25 ENCOUNTER — Ambulatory Visit: Payer: Self-pay

## 2019-11-25 DIAGNOSIS — E1122 Type 2 diabetes mellitus with diabetic chronic kidney disease: Secondary | ICD-10-CM

## 2019-11-25 DIAGNOSIS — I482 Chronic atrial fibrillation, unspecified: Secondary | ICD-10-CM

## 2019-11-25 NOTE — Chronic Care Management (AMB) (Signed)
  Chronic Care Management   Outreach Note  11/25/2019 Name: Danielle Payne MRN: [id number] DOB: 27-Feb-1946  Referred by: Glendale Chard MD Reason for referral : Care Coordination   A second unsuccessful telephone outreach was attempted today. The patient was referred to the case management team for assistance with care management and care coordination.  Unfortunately, the patient does not have a voice mailbox set-up at this time.  Follow Up Plan: The care management team will reach out to the patient again over the next 14 days.   Daneen Schick, BSW, CDP Social Worker, Certified Dementia Practitioner [place] [facility] [place] [facility] [phone]

## 2019-12-01 NOTE — Telephone Encounter (Signed)
Will route to MD to advise if okay?

## 2019-12-01 NOTE — Telephone Encounter (Signed)
Follow up:   Patient calling stating that the medication is to much and would like to know if she could get Glimepiride.

## 2019-12-01 NOTE — Telephone Encounter (Signed)
Glimepiride is for diabetes.  She will need to discuss this with her PCP.

## 2019-12-02 ENCOUNTER — Ambulatory Visit: Payer: Self-pay

## 2019-12-02 DIAGNOSIS — E1122 Type 2 diabetes mellitus with diabetic chronic kidney disease: Secondary | ICD-10-CM

## 2019-12-02 DIAGNOSIS — N182 Chronic kidney disease, stage 2 (mild): Secondary | ICD-10-CM

## 2019-12-02 NOTE — Chronic Care Management (AMB) (Signed)
  Care Management Note   Danielle Payne is a 74 y.o. year old female who is a primary care patient of Danielle Harvey, NP . The CM team was consulted for assistance with care coordination.   Review of patient status, including review of consultants reports, rand collaboration with appropriate care team members and the patient's provider was performed as part of comprehensive patient evaluation and provision of care management services. Telephone outreach to patient today to introduce CM services.   SDOH (Social Determinants of Health) assessments performed: No    Outpatient Encounter Medications as of 12/02/2019  Medication Sig  . albuterol (PROVENTIL HFA;VENTOLIN HFA) 108 (90 Base) MCG/ACT inhaler INHALE TWO PUFFS INTO THE LUNGS EVERY 4 HOURS AS NEEDED FOR WHEEZING OR SHORTNESS OF BREATH (Patient taking differently: Inhale 2 puffs into the lungs every 4 (four) hours as needed for wheezing or shortness of breath. )  . carvedilol (COREG) 25 MG tablet Take 1 tablet(25 mg) in the morning and 1/2 tablet(12.5 mg) in the evening  . diltiazem (CARDIZEM CD) 240 MG 24 hr capsule Take 1 capsule (240 mg total) by mouth daily.  . diphenhydrAMINE HCl (BENADRYL ALLERGY PO) Take by mouth.  . empagliflozin (JARDIANCE) 10 MG TABS tablet Take 10 mg by mouth daily before breakfast. (Patient not taking: Reported on 11/18/2019)  . furosemide (LASIX) 40 MG tablet Take 40 mg by mouth daily as needed for fluid or edema.   . Melatonin 3 MG CAPS Take 3 mg by mouth at bedtime.   . NON FORMULARY Natural nasal spray nedi pot  . NON FORMULARY nattokinasase blood thinner  . Probiotic Product (PROBIOTIC PO) Take by mouth.  . Protease POWD by Does not apply route.   No facility-administered encounter medications on file as of 12/02/2019.    I received an inbound call from [employer] by phone today.   Danielle Payne was given information about Chronic Care Management services today including:  1. CCM service includes  personalized support from designated clinical staff supervised by her physician, including individualized plan of care and coordination with other care providers 2. 24/7 contact phone numbers for assistance for urgent and routine care needs. 3. Service will only be billed when office clinical staff spend 20 minutes or more in a month to coordinate care. 4. Only one practitioner may furnish and bill the service in a calendar month. 5. The patient may stop CCM services at any time (effective at the end of the month) by phone call to the office staff. 6. The patient will be responsible for cost sharing (co-pay) of up to 20% of the service fee (after annual deductible is met).   Patient did not agree to enrollment in care management services and does not wish to consider at this time. The patient reports she has spoken with her husband about this referral and they prefer to "take care of things on their own". The patient requested SW not outreach again regarding enrollment.   Follow Up Plan: No follow up planned at this time. The patient is encouraged to contact her primary provider office if a referral is desired in the future.   Danielle Payne, BSW, CDP Social Worker, Certified Dementia Practitioner [place] [facility] [phone]

## 2019-12-02 NOTE — Chronic Care Management (AMB) (Signed)
  Chronic Care Management   Outreach Note  12/02/2019 Name: Danielle Payne MRN: [id number] DOB: [birth year]  Referred by: Berkley Harvey, NP Reason for referral : Care Coordination   Third unsuccessful telephone outreach was attempted today. The patient was referred to the case management team for assistance with care management and care coordination. The patient's primary care provider has been notified of our unsuccessful attempts to make or maintain contact with the patient. The care management team is pleased to engage with this patient at any time in the future should he/she be interested in assistance from the care management team.    Daneen Schick, BSW, CDP Social Worker, Certified Dementia Practitioner [facility] [phone]

## 2019-12-04 NOTE — Telephone Encounter (Signed)
Spoke with patient and she will follow up with PCP regarding Glimepiride  Advised patient of lab results, she would like to try diet and natural remedies and discuss with Dr Oval Linsey at follow up  She will continue current dose of Diltiazem

## 2019-12-05 ENCOUNTER — Encounter: Payer: Self-pay | Admitting: Internal Medicine

## 2019-12-05 DIAGNOSIS — E66812 Obesity, class 2: Secondary | ICD-10-CM | POA: Insufficient documentation

## 2019-12-05 DIAGNOSIS — N182 Chronic kidney disease, stage 2 (mild): Secondary | ICD-10-CM | POA: Insufficient documentation

## 2019-12-05 DIAGNOSIS — Z6835 Body mass index (BMI) 35.0-35.9, adult: Secondary | ICD-10-CM | POA: Insufficient documentation

## 2019-12-05 DIAGNOSIS — E1122 Type 2 diabetes mellitus with diabetic chronic kidney disease: Secondary | ICD-10-CM | POA: Insufficient documentation

## 2019-12-05 NOTE — Patient Instructions (Signed)

## 2019-12-05 NOTE — Progress Notes (Signed)
This visit occurred during the SARS-CoV-2 public health emergency.  Safety protocols were in place, including screening questions prior to the visit, additional usage of staff PPE, and extensive cleaning of exam room while observing appropriate contact time as indicated for disinfecting solutions.  Subjective:     Patient ID: Danielle Payne , female    DOB: Dec 30, 1945 , 74 y.o.   MRN: [id number]   Chief Complaint  Patient presents with  . Pre-diabetes    HPI  She is here today to establish primary care.  She was referred by Dr. Oval Linsey, her cardiologist.  She reports having borderline diabetes, atrial fibrillation, coronary artery disease s/p CABG and diverticulosis.  She denies having any chest pain and shortness of breath. She reports she is also under the care of a naturopathic physician, and would rather take supplements instead of prescription medications. She is concerned about the long-term effects of rx meds.     Past Medical History:  Diagnosis Date  . Allergy   . Asthma   . Atrial fibrillation ([place])   . Chronic combined systolic and diastolic heart failure ([place]) 05/14/2018  . Coronary artery disease   . Diverticulitis   . Diverticulitis   . Hypertension   . Pure hypercholesterolemia 05/14/2018  . Stented coronary artery      Family History  Problem Relation Age of Onset  . CAD Mother   . Stroke Mother   . Hypertension Mother   . Prostate cancer Father   . Diabetes Mellitus II Sister   . Diabetes Mellitus II Brother   . CAD Maternal Aunt      Current Outpatient Medications:  .  albuterol (PROVENTIL HFA;VENTOLIN HFA) 108 (90 Base) MCG/ACT inhaler, INHALE TWO PUFFS INTO THE LUNGS EVERY 4 HOURS AS NEEDED FOR WHEEZING OR SHORTNESS OF BREATH (Patient taking differently: Inhale 2 puffs into the lungs every 4 (four) hours as needed for wheezing or shortness of breath. ), Disp: 18 g, Rfl: 0 .  carvedilol (COREG) 25 MG tablet, Take 1 tablet(25 mg) in the morning and 1/2  tablet(12.5 mg) in the evening, Disp: , Rfl:  .  diltiazem (CARDIZEM CD) 240 MG 24 hr capsule, Take 1 capsule (240 mg total) by mouth daily., Disp: 90 capsule, Rfl: 3 .  diphenhydrAMINE HCl (BENADRYL ALLERGY PO), Take by mouth., Disp: , Rfl:  .  Melatonin 3 MG CAPS, Take 3 mg by mouth at bedtime. , Disp: , Rfl:  .  NON FORMULARY, Natural nasal spray nedi pot, Disp: , Rfl:  .  NON FORMULARY, nattokinasase blood thinner, Disp: , Rfl:  .  Probiotic Product (PROBIOTIC PO), Take by mouth., Disp: , Rfl:  .  Protease POWD, by Does not apply route., Disp: , Rfl:  .  empagliflozin (JARDIANCE) 10 MG TABS tablet, Take 10 mg by mouth daily before breakfast. (Patient not taking: Reported on 11/18/2019), Disp: 30 tablet, Rfl: 3 .  furosemide (LASIX) 40 MG tablet, Take 40 mg by mouth daily as needed for fluid or edema. , Disp: , Rfl:    No Known Allergies   Review of Systems  Constitutional: Negative.   Respiratory: Negative.   Cardiovascular: Negative.   Gastrointestinal: Negative.   Neurological: Negative.   Psychiatric/Behavioral: Negative.      Today's Vitals   11/18/19 1133  BP: 116/88  Pulse: 73  Temp: 97.8 F (36.6 C)  TempSrc: Oral  Weight: 195 lb 6.4 oz (88.6 kg)  Height: 5' 1.8" (1.57 m)  PainSc: 0-No pain   Body  mass index is 35.97 kg/m.   Objective:  Physical Exam Vitals and nursing note reviewed.  Constitutional:      Appearance: Normal appearance.  HENT:     Head: Normocephalic and atraumatic.  Cardiovascular:     Rate and Rhythm: Normal rate. Rhythm irregular.     Heart sounds: Normal heart sounds.  Pulmonary:     Effort: Pulmonary effort is normal.     Breath sounds: Normal breath sounds.  Skin:    General: Skin is warm.  Neurological:     General: No focal deficit present.     Mental Status: She is alert.  Psychiatric:        Mood and Affect: Mood normal.        Behavior: Behavior normal.         Assessment And Plan:     1. Diabetes mellitus with stage  2 chronic kidney disease ([place])  Labs reviewed, reveals she actually has diabetes, not pre-diabetes. She is encouraged to avoid sugary beverages and processed foods. She does not wish to take rx meds, she prefers to take her supplements. Pt encouraged to consider long-term effects of untreated diabetes - including cardiovascular disease, renal disease, stroke and retinopathy. Cardiologist suggested use of Jardiance, she declined. Pt advised to do research on Farxiga - I discussed benefits of medication to her, along with possible side effects. She declines at this time.  She does agree to [facility] referral for disease education and community resources.   - Hemoglobin A1c - Referral to Chronic Care Management Services - POCT Urinalysis Dipstick (81002) - POCT UA - Microalbumin  2. Chronic atrial fibrillation (HCC)  Chronic. Cardiology notes reviewed in full during her visit. She does not wish to take warfarin or any other blood thinners.   - Referral to Chronic Care Management Services - TSH  3. Pure hypercholesterolemia  Chronic. She refuses to take statins. Pt advised to consider berberine long with red yeast rice. She reports she is taking something better than these two options from her naturopathic provider. She unfortunately did not bring her supplements in today.   - Referral to Chronic Care Management Services  4. Diverticulitis of large intestine with perforation without bleeding  Chronic. [facility] notes reviewed in full detail. She is encouraged to stay hydrated and take fiber supplements to help with regularity.   5. Estrogen deficiency  She agrees for referral for a bone density. She is encouraged to take vitamin D/calcium supplements and to engage in weightbearing exercises at least three days per week.   - DG Bone Density  6. Class 2 severe obesity due to excess calories with serious comorbidity and body mass index (BMI) of 35.0 to 35.9 in adult [facility])  She is encouraged to strive  for BMI less than 30 to decrease cardiac risk. She is advised to start exercising 30 minutes five days per week.   7. Long term (current) use of anticoagulants  She refuses to take Warfarin. She is encouraged to consider one of the newer anticoagulants in its place, she declines. Risk of no anticoagulation in setting of afib was discussed with the patient in full detail. She will discuss further with her naturopathic physician.  8. Coronary artery disease involving native coronary artery of native heart without angina pectoris  S/p CABG.  She is    Maximino Greenland, MD    THE PATIENT IS ENCOURAGED TO PRACTICE SOCIAL DISTANCING DUE TO THE COVID-19 PANDEMIC.

## 2019-12-17 DIAGNOSIS — M9902 Segmental and somatic dysfunction of thoracic region: Secondary | ICD-10-CM | POA: Diagnosis not present

## 2019-12-17 DIAGNOSIS — M9901 Segmental and somatic dysfunction of cervical region: Secondary | ICD-10-CM | POA: Diagnosis not present

## 2019-12-17 DIAGNOSIS — M47812 Spondylosis without myelopathy or radiculopathy, cervical region: Secondary | ICD-10-CM | POA: Diagnosis not present

## 2019-12-17 DIAGNOSIS — M542 Cervicalgia: Secondary | ICD-10-CM | POA: Diagnosis not present

## 2019-12-31 DIAGNOSIS — M47814 Spondylosis without myelopathy or radiculopathy, thoracic region: Secondary | ICD-10-CM | POA: Diagnosis not present

## 2019-12-31 DIAGNOSIS — M542 Cervicalgia: Secondary | ICD-10-CM | POA: Diagnosis not present

## 2019-12-31 DIAGNOSIS — M9903 Segmental and somatic dysfunction of lumbar region: Secondary | ICD-10-CM | POA: Diagnosis not present

## 2019-12-31 DIAGNOSIS — M9901 Segmental and somatic dysfunction of cervical region: Secondary | ICD-10-CM | POA: Diagnosis not present

## 2020-01-14 DIAGNOSIS — M47814 Spondylosis without myelopathy or radiculopathy, thoracic region: Secondary | ICD-10-CM | POA: Diagnosis not present

## 2020-01-14 DIAGNOSIS — M9901 Segmental and somatic dysfunction of cervical region: Secondary | ICD-10-CM | POA: Diagnosis not present

## 2020-01-14 DIAGNOSIS — M542 Cervicalgia: Secondary | ICD-10-CM | POA: Diagnosis not present

## 2020-01-14 DIAGNOSIS — M9903 Segmental and somatic dysfunction of lumbar region: Secondary | ICD-10-CM | POA: Diagnosis not present

## 2020-01-28 ENCOUNTER — Other Ambulatory Visit: Payer: Self-pay

## 2020-01-28 ENCOUNTER — Ambulatory Visit (INDEPENDENT_AMBULATORY_CARE_PROVIDER_SITE_OTHER): Payer: Medicare Other | Admitting: Cardiovascular Disease

## 2020-01-28 ENCOUNTER — Encounter: Payer: Self-pay | Admitting: Cardiovascular Disease

## 2020-01-28 VITALS — BP 132/82 | HR 81 | Ht 63.0 in | Wt 197.0 lb

## 2020-01-28 DIAGNOSIS — M9903 Segmental and somatic dysfunction of lumbar region: Secondary | ICD-10-CM | POA: Diagnosis not present

## 2020-01-28 DIAGNOSIS — I10 Essential (primary) hypertension: Secondary | ICD-10-CM | POA: Diagnosis not present

## 2020-01-28 DIAGNOSIS — M47814 Spondylosis without myelopathy or radiculopathy, thoracic region: Secondary | ICD-10-CM | POA: Diagnosis not present

## 2020-01-28 DIAGNOSIS — I482 Chronic atrial fibrillation, unspecified: Secondary | ICD-10-CM | POA: Diagnosis not present

## 2020-01-28 DIAGNOSIS — M9901 Segmental and somatic dysfunction of cervical region: Secondary | ICD-10-CM | POA: Diagnosis not present

## 2020-01-28 DIAGNOSIS — I2510 Atherosclerotic heart disease of native coronary artery without angina pectoris: Secondary | ICD-10-CM | POA: Diagnosis not present

## 2020-01-28 DIAGNOSIS — M542 Cervicalgia: Secondary | ICD-10-CM | POA: Diagnosis not present

## 2020-01-28 DIAGNOSIS — I5042 Chronic combined systolic (congestive) and diastolic (congestive) heart failure: Secondary | ICD-10-CM | POA: Diagnosis not present

## 2020-01-28 NOTE — Progress Notes (Signed)
Cardiology Office Note   Date:  01/28/2020   ID:  Danielle Payne, DOB 1945/11/18, MRN [id number]  PCP:  Glendale Chard, MD  Cardiologist:  Skeet Latch, MD  Electrophysiologist:  None   Evaluation Performed:  Follow-Up Visit  Chief Complaint:  CAD  History of Present Illness:    Danielle Payne is a 74 y.o. female with CAD s/p CABG (LIMA-->LAD, SVG-->OM, SVG-->PDA), chronic systolic and diastolic heart failure (systolic dysfunction resolved), hypertension, persistent atrial fibrillation, diabetes, and asthma here for follow up.  She was initially seen 05/2018 to establish care.  She was first diagnosed with atrial fibrillation in 2010 when she presented with afib with RVR.  She had an NSTEMI and had a Promus DES to the LCx.  She had a recurrent episode of symptomatic atrial fibrillation 03/2011.  She also required DCCV with Dr. Nadyne Coombes 12/2014.  She presented with recurrent NSTEMI  09/2016 and underwent 3 vessel CABG 09/25/16.  That surgery was complicated by recurrent pleural effusions that required thoracentesis.  Echo at that time revealed LVEF 35-40%.  Since that time she has felt well.  She has occasional heart fluttering that is not sustained.  When she moved back from [place] she decided to re-establish with [place].  Danielle Payne had an echo 05/2018 that revealed an improvement in her LVEF 65 to 70%.    Danielle Payne is not willing to take statins because she is afraid of potential harm.  She was admitted 11/2018 with influenza.  This was complicated by atrial fibrillation with rapid ventricular response.  Her heart rate improved with control of her respiratory symptoms.  She spoke with our pharmacist and it was recommended that she be seen by [facility] but has not pursued this recommendation.  She sees a Theatre stage manager doctor who recommended that Coumadin be discontinued because it "stiffens her arteries."  She is taking a natural blood thinner instead.  She also is using a  natural supplement for cholesterol (dandilion and milk thistle).   She was seen Eldridge Abrahams but when she decided not to take Metformin because other family members had not tolerated that she decided not to follow back up with Danielle Payne.  At her last appointment she requested a prescription for Jardiance, which was supplied.  However she was unable to afford it and is only taking a natural supplement for her diabetes.  She is scheduled to see Dr. Baird Cancer next month.  She has not had any episodes of palpitations and wonders if she is taking too much medication for her atrial fibrillation.  She struggles with seasonal allergies but has no exertional chest pain.  She exercises on a rebounder machine for 10 minutes daily.  She feels better after exercise.  She denies any lower extremity edema, orthopnea, or PND.  At home her blood pressure is sometimes elevated to the 130s or 140s.  She eats mostly at home and does try to limit her sodium intake.    The patient does not have symptoms concerning for COVID-19 infection (fever, chills, cough, or new shortness of breath).    Past Medical History:  Diagnosis Date  . Allergy   . Asthma   . Atrial fibrillation ([place])   . Chronic combined systolic and diastolic heart failure ([place]) 05/14/2018  . Coronary artery disease   . Diverticulitis   . Diverticulitis   . Hypertension   . Pure hypercholesterolemia 05/14/2018  . Stented coronary artery    Past Surgical History:  Procedure Laterality  Date  . ABDOMINAL HYSTERECTOMY    . CARDIAC CATHETERIZATION    . CARDIOVERSION N/A 12/21/2014   Procedure: CARDIOVERSION;  Surgeon: Adrian Prows, MD;  Location: [facility];  Service: Cardiovascular;  Laterality: N/A;  . CORONARY ANGIOPLASTY    . CORONARY ARTERY BYPASS GRAFT       Current Meds  Medication Sig  . albuterol (PROVENTIL HFA;VENTOLIN HFA) 108 (90 Base) MCG/ACT inhaler INHALE TWO PUFFS INTO THE LUNGS EVERY 4 HOURS AS NEEDED FOR WHEEZING OR SHORTNESS OF BREATH  (Patient taking differently: Inhale 2 puffs into the lungs every 4 (four) hours as needed for wheezing or shortness of breath. )  . carvedilol (COREG) 25 MG tablet Take 1 tablet(25 mg) in the morning and 1/2 tablet(12.5 mg) in the evening  . diltiazem (CARDIZEM CD) 240 MG 24 hr capsule Take 1 capsule (240 mg total) by mouth daily.  . diphenhydrAMINE HCl (BENADRYL ALLERGY PO) Take by mouth.  . furosemide (LASIX) 40 MG tablet Take 40 mg by mouth daily as needed for fluid or edema.   . Melatonin 3 MG CAPS Take 3 mg by mouth at bedtime.   . NON FORMULARY Natural nasal spray nedi pot  . NON FORMULARY nattokinasase blood thinner  . Probiotic Product (PROBIOTIC PO) Take by mouth.  . Protease POWD by Does not apply route.  Marland Kitchen UBIQUINOL PO Take 100 mg by mouth.  . [DISCONTINUED] empagliflozin (JARDIANCE) 10 MG TABS tablet Take 10 mg by mouth daily before breakfast.     Allergies:   Patient has no known allergies.   Social History   Tobacco Use  . Smoking status: Never Smoker  . Smokeless tobacco: Never Used  Substance Use Topics  . Alcohol use: No  . Drug use: No     Family Hx: The patient's family history includes CAD in her maternal aunt and mother; Diabetes Mellitus II in her brother and sister; Hypertension in her mother; Prostate cancer in her father; Stroke in her mother.  ROS:   Please see the history of present illness.     All other systems reviewed and are negative.   Prior CV studies:   The following studies were reviewed today:  [place] 11/12/08: 90% mLCx.  X/p 3.0 28 mm Promus DES to mid Lcx.  LVEF 68^ Stress echo 09/01/2009: 7 min 16s on [facility] protocol.  LVEF 60-65%.  No ischemia. Echo 12/2009: LVEF 65-70%.  Moderate LVH.  Mild LVOT obstruction. Echo 04/03/11: LVEF 65-70%.  Moderate LVH.  RVSP 20 mmHg  Echo 05/13/18: Study Conclusions  - Left ventricle: The cavity size was normal. Wall thickness was increased in a pattern of mild LVH. Systolic function was vigorous.  The estimated ejection fraction was in the range of 65% to 70%. Wall motion was normal; there were no regional wall motion abnormalities. - Mitral valve: Calcified annulus. There was trivial regurgitation. - Right ventricle: The cavity size was mildly dilated. Wall thickness was normal. - Right atrium: The atrium was mildly dilated.  Labs/Other Tests and Data Reviewed:    EKG:  An ECG dated 11/20/18 was personally reviewed today and demonstrated:  atrial fibrillation.  Rate 157 bpm.  Non-specific ST changes  11/03/19: Atrial fibrillation.  Rate 59 bpm.  LVH.  Prior inferior infarct Recent Labs: 04/13/2019: Hemoglobin 14.1; Platelets 381 11/05/2019: ALT 15; BUN 20; Creatinine, Ser 1.09; Potassium 4.7; Sodium 139 11/18/2019: TSH 0.884   Recent Lipid Panel Lab Results  Component Value Date/Time   CHOL 191 11/05/2019 09:02 AM  TRIG 187 (H) 11/05/2019 09:02 AM   HDL 39 (L) 11/05/2019 09:02 AM   CHOLHDL 4.9 (H) 11/05/2019 09:02 AM   LDLCALC 119 (H) 11/05/2019 09:02 AM    Wt Readings from Last 3 Encounters:  01/28/20 197 lb (89.4 kg)  11/18/19 195 lb 6.4 oz (88.6 kg)  11/03/19 195 lb 3.2 oz (88.5 kg)     Objective:    VS:  BP 132/82   Pulse 81   Ht 5\' 3"  (1.6 m)   Wt 197 lb (89.4 kg)   BMI 34.90 kg/m  , BMI Body mass index is 34.9 kg/m. GENERAL:  Well appearing HEENT: Pupils equal round and reactive, fundi not visualized, oral mucosa unremarkable NECK:  No jugular venous distention, waveform within normal limits, carotid upstroke brisk and symmetric, no bruits LUNGS:  Clear to auscultation bilaterally HEART:  Irregularly irregular.  PMI not displaced or sustained,S1 and S2 within normal limits, no S3, no S4, no clicks, no rubs, no murmurs ABD:  Flat, positive bowel sounds normal in frequency in pitch, no bruits, no rebound, no guarding, no midline pulsatile mass, no hepatomegaly, no splenomegaly EXT:  2 plus pulses throughout, no edema, no cyanosis no clubbing SKIN:  No  rashes no nodules NEURO:  Cranial nerves II through XII grossly intact, motor grossly intact throughout PSYCH:  Cognitively intact, oriented to person place and time   ASSESSMENT & PLAN:    # Chronic systolic and diastolic heart failure: Danielle Payne is doing well and has no heart failure symptoms.  She had an echo at the time of her NSTEMI at which time her LVEF was 35-40%.  It has improved to 65-70% 05/2018.  She is euvolemic.  Continue carvedilol and furosemide.  # CAD s/p NSTEMI/PCI/CABG: # Hyperlipidemia: No recent symptoms.  She continues to refuse statins or Zetia.  She prefers her dandelion and milk thistle.  She is unable to afford PCSK9 inhibitors and is uninterested.   # Essential hypertension: Blood pressure is above goal.  She isn't interested in increasing or adding medication.  Work on diet and exercise.   # Longstanding persistent atrial fibrillation:  Rate well-controlled.  Continue diltiazem and carvedilol.  She decided to stop warfarin due to concern that it was stiffening her arteries.  She is unwilling to take Eliquis or Xarelto because she does not know "what is in them."  She is taking a natural blood thinner sold over-the-counter upon the recommendation of her herbalist.  I am unable to recommend that she continue with this strategy.  I strongly urged her to consider prescribed anticoagulant.   # DM:  She remains unwilling to try Metformin.  She was unable to afford Jardiance.  Again, she prefers her natural supplements.  She is going to see her PCP next month.      Medication Adjustments/Labs and Tests Ordered: Current medicines are reviewed at length with the patient today.  Concerns regarding medicines are outlined above.   Tests Ordered: No orders of the defined types were placed in this encounter.   Medication Changes: No orders of the defined types were placed in this encounter.   Disposition:  Follow up in 6 month(s)  Signed, Skeet Latch, MD    01/28/2020 10:23 AM    [place]

## 2020-01-28 NOTE — Patient Instructions (Signed)
Medication Instructions:  Your physician recommends that you continue on your current medications as directed. Please refer to the Current Medication list given to you today.  *If you need a refill on your cardiac medications before your next appointment, please call your pharmacy*  Lab Work: NONE If you have labs (blood work) drawn today and your tests are completely normal, you will receive your results only by: Marland Kitchen MyChart Message (if you have MyChart) OR . A paper copy in the mail If you have any lab test that is abnormal or we need to change your treatment, we will call you to review the results.  Testing/Procedures: NONE  Follow-Up: At [facility], you and your health needs are our priority.  As part of our continuing mission to provide you with exceptional heart care, we have created designated Provider Care Teams.  These Care Teams include your primary Cardiologist (physician) and Advanced Practice Providers (APPs -  Physician Assistants and Nurse Practitioners) who all work together to provide you with the care you need, when you need it.  We recommend signing up for the patient portal called "MyChart".  Sign up information is provided on this After Visit Summary.  MyChart is used to connect with patients for Virtual Visits (Telemedicine).  Patients are able to view lab/test results, encounter notes, upcoming appointments, etc.  Non-urgent messages can be sent to your provider as well.   To learn more about what you can do with MyChart, go to [email or web address].    Your next appointment:   6 month(s) You will receive a reminder letter in the mail two months in advance. If you don't receive a letter, please call our office to schedule the follow-up appointment.  The format for your next appointment:   In Person  Provider:   You may see Skeet Latch, MD or one of the following Advanced Practice Providers on your designated Care Team:    Kerin Ransom, PA-C  [place], [place]  Coletta Memos, FNP    Other Instructions  INCREASE YOUR EXERCISE TO 150 MINUTES EACH WEEK   WILL REACH OUT TO STAFF PHARMACIST TO SEE IF THERE IS A DILTIAZEM WITHOUT RED DYE IN IT

## 2020-02-02 ENCOUNTER — Other Ambulatory Visit: Payer: Self-pay

## 2020-02-02 MED ORDER — ALBUTEROL SULFATE HFA 108 (90 BASE) MCG/ACT IN AERS: INHALATION_SPRAY | RESPIRATORY_TRACT | 1 refills | Status: DC

## 2020-02-10 ENCOUNTER — Other Ambulatory Visit: Payer: Medicare Other

## 2020-02-16 ENCOUNTER — Ambulatory Visit: Payer: Self-pay | Admitting: Internal Medicine

## 2020-02-25 DIAGNOSIS — M9901 Segmental and somatic dysfunction of cervical region: Secondary | ICD-10-CM | POA: Diagnosis not present

## 2020-02-25 DIAGNOSIS — M47814 Spondylosis without myelopathy or radiculopathy, thoracic region: Secondary | ICD-10-CM | POA: Diagnosis not present

## 2020-02-25 DIAGNOSIS — M9903 Segmental and somatic dysfunction of lumbar region: Secondary | ICD-10-CM | POA: Diagnosis not present

## 2020-02-25 DIAGNOSIS — M542 Cervicalgia: Secondary | ICD-10-CM | POA: Diagnosis not present

## 2020-02-27 ENCOUNTER — Other Ambulatory Visit: Payer: Self-pay | Admitting: Cardiovascular Disease

## 2020-02-29 ENCOUNTER — Other Ambulatory Visit: Payer: Self-pay | Admitting: Nurse Practitioner

## 2020-02-29 MED ORDER — AZITHROMYCIN 250 MG PO TABS: ORAL_TABLET | ORAL | 0 refills | Status: AC

## 2020-03-01 ENCOUNTER — Encounter: Payer: Self-pay | Admitting: Nurse Practitioner

## 2020-03-01 ENCOUNTER — Telehealth (INDEPENDENT_AMBULATORY_CARE_PROVIDER_SITE_OTHER): Payer: Medicare Other | Admitting: Nurse Practitioner

## 2020-03-01 ENCOUNTER — Telehealth: Payer: Self-pay

## 2020-03-01 ENCOUNTER — Other Ambulatory Visit: Payer: Self-pay

## 2020-03-01 ENCOUNTER — Other Ambulatory Visit: Payer: Self-pay | Admitting: Cardiovascular Disease

## 2020-03-01 DIAGNOSIS — R059 Cough, unspecified: Secondary | ICD-10-CM

## 2020-03-01 DIAGNOSIS — R05 Cough: Secondary | ICD-10-CM | POA: Diagnosis not present

## 2020-03-01 DIAGNOSIS — R509 Fever, unspecified: Secondary | ICD-10-CM | POA: Diagnosis not present

## 2020-03-01 NOTE — Telephone Encounter (Signed)
Pt consent to MYCHART visit 

## 2020-03-01 NOTE — Progress Notes (Signed)
Virtual Visit via Video   This visit type was conducted due to national recommendations for restrictions regarding the COVID-19 Pandemic (e.g. social distancing) in an effort to limit this patient's exposure and mitigate transmission in our community.  Due to her co-morbid illnesses, this patient is at least at moderate risk for complications without adequate follow up.  This format is felt to be most appropriate for this patient at this time.  All issues noted in this document were discussed and addressed.  A limited physical exam was performed with this format.    This visit type was conducted due to national recommendations for restrictions regarding the COVID-19 Pandemic (e.g. social distancing) in an effort to limit this patient's exposure and mitigate transmission in our community.  Patients identity confirmed using two different identifiers.  This format is felt to be most appropriate for this patient at this time.  All issues noted in this document were discussed and addressed.  No physical exam was performed (except for noted visual exam findings with Video Visits).    Date:  03/01/2020   ID:  Kendel Richarson, DOB 19-Apr-1946, MRN [id number]  Patient Location:  Home - spoke with [name]  Provider location:   Office    Chief Complaint:   Cough   History of Present Illness:    Dahlila Hrivnak is a 74 y.o. female who presents via video conferencing for a telehealth visit today.    The patient does have symptoms concerning for COVID-19 infection (fever, chills, cough, or new shortness of breath).   She is taking loratadine. She is planning to go and see family.  She has not had the Covid vaccine.    Cough This is a new problem. The current episode started in the past 7 days. The problem has been gradually improving. The cough is productive of sputum (sputum is clear/yellow). Associated symptoms include a fever. Pertinent negatives include no chest pain, shortness of breath  or wheezing. She has tried ipratropium inhaler for the symptoms. There is no history of asthma or pneumonia.  Fever  This is a new problem. Associated symptoms include coughing. Pertinent negatives include no chest pain or wheezing.     Past Medical History:  Diagnosis Date  . Allergy   . Asthma   . Atrial fibrillation ([place])   . Chronic combined systolic and diastolic heart failure ([place]) 05/14/2018  . Coronary artery disease   . Diverticulitis   . Diverticulitis   . Hypertension   . Pure hypercholesterolemia 05/14/2018  . Stented coronary artery    Past Surgical History:  Procedure Laterality Date  . ABDOMINAL HYSTERECTOMY    . CARDIAC CATHETERIZATION    . CARDIOVERSION N/A 12/21/2014   Procedure: CARDIOVERSION;  Surgeon: Adrian Prows, MD;  Location: [facility];  Service: Cardiovascular;  Laterality: N/A;  . CORONARY ANGIOPLASTY    . CORONARY ARTERY BYPASS GRAFT       Current Meds  Medication Sig  . albuterol (VENTOLIN HFA) 108 (90 Base) MCG/ACT inhaler INHALE TWO PUFFS INTO THE LUNGS EVERY 4 HOURS AS NEEDED FOR WHEEZING OR SHORTNESS OF BREATH  . azithromycin (ZITHROMAX) 250 MG tablet Take 2 tablets (500 mg) on  Day 1,  followed by 1 tablet (250 mg) once daily on Days 2 through 5.  . carvedilol (COREG) 25 MG tablet Take 1 tablet(25 mg) in the morning and 1/2 tablet(12.5 mg) in the evening  . diltiazem (CARDIZEM CD) 240 MG 24 hr capsule Take 1 capsule (240 mg total) by  mouth daily.  . diphenhydrAMINE HCl (BENADRYL ALLERGY PO) Take by mouth.  . furosemide (LASIX) 40 MG tablet Take 40 mg by mouth daily as needed for fluid or edema.   . Melatonin 3 MG CAPS Take 3 mg by mouth at bedtime.   . NON FORMULARY Natural nasal spray nedi pot  . NON FORMULARY nattokinasase blood thinner  . Probiotic Product (PROBIOTIC PO) Take by mouth.  . Protease POWD by Does not apply route.  Marland Kitchen UBIQUINOL PO Take 100 mg by mouth.     Allergies:   Patient has no known allergies.   Social History    Tobacco Use  . Smoking status: Never Smoker  . Smokeless tobacco: Never Used  Substance Use Topics  . Alcohol use: No  . Drug use: No     Family Hx: The patient's family history includes CAD in her maternal aunt and mother; Diabetes Mellitus II in her brother and sister; Hypertension in her mother; Prostate cancer in her father; Stroke in her mother.  ROS:   Please see the history of present illness.    Review of Systems  Constitutional: Positive for fever. Negative for malaise/fatigue.  Respiratory: Positive for cough. Negative for shortness of breath and wheezing.   Cardiovascular: Negative for chest pain.  Neurological: Negative for dizziness and tingling.  Psychiatric/Behavioral: Negative.     All other systems reviewed and are negative.   Labs/Other Tests and Data Reviewed:    Recent Labs: 04/13/2019: Hemoglobin 14.1; Platelets 381 11/05/2019: ALT 15; BUN 20; Creatinine, Ser 1.09; Potassium 4.7; Sodium 139 11/18/2019: TSH 0.884   Recent Lipid Panel Lab Results  Component Value Date/Time   CHOL 191 11/05/2019 09:02 AM   TRIG 187 (H) 11/05/2019 09:02 AM   HDL 39 (L) 11/05/2019 09:02 AM   CHOLHDL 4.9 (H) 11/05/2019 09:02 AM   LDLCALC 119 (H) 11/05/2019 09:02 AM    Wt Readings from Last 3 Encounters:  01/28/20 197 lb (89.4 kg)  11/18/19 195 lb 6.4 oz (88.6 kg)  11/03/19 195 lb 3.2 oz (88.5 kg)     Exam:    Vital Signs:  There were no vitals taken for this visit.    Physical Exam  Constitutional: She is oriented to person, place, and time and well-developed, well-nourished, and in no distress. No distress.  Pulmonary/Chest: Effort normal. No respiratory distress.  Neurological: She is alert and oriented to person, place, and time. No cranial nerve deficit.  Psychiatric: Mood, memory, affect and judgment normal.    ASSESSMENT & PLAN:     1. Cough  She is feeling better after starting the azithromycin   She does not want a cough suppressant, I have  advised her to take her loratadine at night to help with the secretions.   I have recommended for her to go for a covid test, she has not had the covid vaccine and she is planning to travel. She declines going to be tested for covid.    2. Fever, unspecified fever cause  She no longer has a fever since starting the azithromycin.  COVID-19 Education: The signs and symptoms of COVID-19 were discussed with the patient and how to seek care for testing (follow up with PCP or arrange E-visit).  The importance of social distancing was discussed today.  Patient Risk:   After full review of this patients clinical status, I feel that they are at least moderate risk at this time.  Time:   Today, I have spent 10 minutes/ seconds with  the patient with telehealth technology discussing above diagnoses.     Medication Adjustments/Labs and Tests Ordered: Current medicines are reviewed at length with the patient today.  Concerns regarding medicines are outlined above.   Tests Ordered: No orders of the defined types were placed in this encounter.   Medication Changes: No orders of the defined types were placed in this encounter.   Disposition:  Follow up prn  Signed, Minette Brine, FNP

## 2020-03-01 NOTE — Telephone Encounter (Signed)
*  STAT* If patient is at the pharmacy, call can be transferred to refill team.   1. Which medications need to be refilled? (please list name of each medication and dose if known)  carvedilol (COREG) 25 MG tablet  2. Which pharmacy/location (including street and city if local pharmacy) is medication to be sent to? [facility] at [place], [place]  3. Do they need a 30 day or 90 day supply? 90 day supply

## 2020-03-02 ENCOUNTER — Other Ambulatory Visit: Payer: Self-pay

## 2020-03-02 NOTE — Telephone Encounter (Signed)
If a patient is requesting a cardiac medication and they were on it when I last saw them, it is always ok to refill it in my name.  Thanks!

## 2020-03-03 ENCOUNTER — Other Ambulatory Visit: Payer: Self-pay

## 2020-03-03 MED ORDER — CARVEDILOL 25 MG PO TABS: 25.0000 mg | ORAL_TABLET | Freq: Two times a day (BID) | ORAL | 1 refills | Status: DC

## 2020-03-04 ENCOUNTER — Telehealth: Payer: Self-pay | Admitting: *Deleted

## 2020-03-04 DIAGNOSIS — E119 Type 2 diabetes mellitus without complications: Secondary | ICD-10-CM | POA: Diagnosis not present

## 2020-03-04 DIAGNOSIS — J45901 Unspecified asthma with (acute) exacerbation: Secondary | ICD-10-CM | POA: Diagnosis not present

## 2020-03-04 DIAGNOSIS — I5020 Unspecified systolic (congestive) heart failure: Secondary | ICD-10-CM | POA: Diagnosis not present

## 2020-03-04 DIAGNOSIS — I2510 Atherosclerotic heart disease of native coronary artery without angina pectoris: Secondary | ICD-10-CM | POA: Diagnosis not present

## 2020-03-04 DIAGNOSIS — I509 Heart failure, unspecified: Secondary | ICD-10-CM | POA: Diagnosis not present

## 2020-03-04 DIAGNOSIS — I270 Primary pulmonary hypertension: Secondary | ICD-10-CM | POA: Diagnosis not present

## 2020-03-04 DIAGNOSIS — R635 Abnormal weight gain: Secondary | ICD-10-CM | POA: Diagnosis not present

## 2020-03-04 DIAGNOSIS — I4891 Unspecified atrial fibrillation: Secondary | ICD-10-CM | POA: Diagnosis not present

## 2020-03-04 LAB — MICROALBUMIN, URINE: Microalb, Ur: 66

## 2020-03-04 LAB — COMPREHENSIVE METABOLIC PANEL
Albumin: 4.2 (ref 3.5–5.0)
Calcium: 9.3 (ref 8.7–10.7)
GFR calc non Af Amer: 71.3
Globulin: 2.9

## 2020-03-04 LAB — CBC: RBC: 4.76 (ref 3.87–5.11)

## 2020-03-04 LAB — TSH: TSH: 0.82 (ref 0.41–5.90)

## 2020-03-04 LAB — HEPATIC FUNCTION PANEL
ALT: 29 (ref 7–35)
AST: 15 (ref 13–35)
Alkaline Phosphatase: 125 (ref 25–125)
Bilirubin, Total: 0.4

## 2020-03-04 LAB — BASIC METABOLIC PANEL
BUN: 24 — AB (ref 4–21)
CO2: 25 — AB (ref 13–22)
Chloride: 103 (ref 99–108)
Creatinine: 0.8 (ref 0.5–1.1)
Glucose: 281
Potassium: 4 (ref 3.4–5.3)
Sodium: 138 (ref 137–147)

## 2020-03-04 LAB — CBC AND DIFFERENTIAL
HCT: 43 (ref 36–46)
Hemoglobin: 13.9 (ref 12.0–16.0)
Neutrophils Absolute: 60
Platelets: 233 (ref 150–399)
WBC: 8.6

## 2020-03-04 LAB — HEMOGLOBIN A1C: Hemoglobin A1C: 9.1

## 2020-03-04 NOTE — Telephone Encounter (Signed)
Received Rx request for Carvedilol  Left message to call back to discuss how she is taking

## 2020-03-08 ENCOUNTER — Other Ambulatory Visit: Payer: Self-pay | Admitting: Cardiovascular Disease

## 2020-03-08 NOTE — Telephone Encounter (Signed)
Patient states she is taking 2 tablets daily. Refill request sent to refill team.

## 2020-03-08 NOTE — Telephone Encounter (Addendum)
Left message to call back to discuss with patient Last ov says 1 in am 1/2 in evening

## 2020-03-08 NOTE — Telephone Encounter (Signed)
*  STAT* If patient is at the pharmacy, call can be transferred to refill team.   1. Which medications need to be refilled? (please list name of each medication and dose if known) carvedilol (COREG) 25 MG tablet  2. Which pharmacy/location (including street and city if local pharmacy) is medication to be sent to? [facility] at [place], [place]  3. Do they need a 30 day or 90 day supply? [place]

## 2020-03-09 MED ORDER — CARVEDILOL 25 MG PO TABS: 25.0000 mg | ORAL_TABLET | Freq: Two times a day (BID) | ORAL | 1 refills | Status: DC

## 2020-03-09 NOTE — Telephone Encounter (Signed)
Spoke with patient and she has been taking Carvedilol full tablet twice daily for quite a while including last ov Refilled as requested

## 2020-03-10 NOTE — Telephone Encounter (Signed)
OK for her to take it as she has been

## 2020-03-16 ENCOUNTER — Encounter: Payer: Self-pay | Admitting: Internal Medicine

## 2020-03-16 ENCOUNTER — Ambulatory Visit (INDEPENDENT_AMBULATORY_CARE_PROVIDER_SITE_OTHER): Payer: Medicare Other | Admitting: Internal Medicine

## 2020-03-16 ENCOUNTER — Other Ambulatory Visit: Payer: Self-pay

## 2020-03-16 VITALS — BP 126/74 | HR 47 | Temp 98.0°F | Ht 63.0 in | Wt 197.4 lb

## 2020-03-16 DIAGNOSIS — N182 Chronic kidney disease, stage 2 (mild): Secondary | ICD-10-CM

## 2020-03-16 DIAGNOSIS — Z79899 Other long term (current) drug therapy: Secondary | ICD-10-CM

## 2020-03-16 DIAGNOSIS — E1122 Type 2 diabetes mellitus with diabetic chronic kidney disease: Secondary | ICD-10-CM | POA: Diagnosis not present

## 2020-03-16 DIAGNOSIS — I2510 Atherosclerotic heart disease of native coronary artery without angina pectoris: Secondary | ICD-10-CM | POA: Diagnosis not present

## 2020-03-16 DIAGNOSIS — I482 Chronic atrial fibrillation, unspecified: Secondary | ICD-10-CM

## 2020-03-16 DIAGNOSIS — Z6834 Body mass index (BMI) 34.0-34.9, adult: Secondary | ICD-10-CM

## 2020-03-16 DIAGNOSIS — E6609 Other obesity due to excess calories: Secondary | ICD-10-CM | POA: Diagnosis not present

## 2020-03-16 NOTE — Patient Instructions (Addendum)
Calm, magnesium supplement, one tsp nightly You may get at [facility], [facility], [facility] Call Dr. Baird Cancer at [phone] if you have questions  Take Farxiga 10mg  once daily in AM, STAY HYDRATED!  Hypomagnesemia Hypomagnesemia is a condition in which the level of magnesium in the blood is low. Magnesium is a mineral that is found in many foods. It is used in many different processes in the body. Hypomagnesemia can affect every organ in the body. In severe cases, it can cause life-threatening problems. What are the causes? This condition may be caused by:  Not getting enough magnesium in your diet.  Malnutrition.  Problems with absorbing magnesium from the intestines.  Dehydration.  Alcohol abuse.  Vomiting.  Severe or chronic diarrhea.  Some medicines, including medicines that make you urinate more (diuretics).  Certain diseases, such as kidney disease, diabetes, celiac disease, and overactive thyroid. What are the signs or symptoms? Symptoms of this condition include:  Loss of appetite.  Nausea and vomiting.  Involuntary shaking or trembling of a body part (tremor).  Muscle weakness.  Tingling in the arms and legs.  Sudden tightening of muscles (muscle spasms).  Confusion.  Psychiatric issues, such as depression, irritability, or psychosis.  A feeling of fluttering of the heart.  Seizures. These symptoms are more severe if magnesium levels drop suddenly. How is this diagnosed? This condition may be diagnosed based on:  Your symptoms and medical history.  A physical exam.  Blood and urine tests. How is this treated? Treatment depends on the cause and the severity of the condition. It may be treated with:  A magnesium supplement. This can be taken in pill form. If the condition is severe, magnesium is usually given through an IV.  Changes to your diet. You may be directed to eat foods that have a lot of magnesium, such as green leafy  vegetables, peas, beans, and nuts.  Stopping any intake of alcohol. Follow these instructions at home:      Make sure that your diet includes foods with magnesium. Foods that have a lot of magnesium in them include: ? Green leafy vegetables, such as spinach and broccoli. ? Beans and peas. ? Nuts and seeds, such as almonds and sunflower seeds. ? Whole grains, such as whole grain bread and fortified cereals.  Take magnesium supplements if your health care provider tells you to do that. Take them as directed.  Take over-the-counter and prescription medicines only as told by your health care provider.  Have your magnesium levels monitored as told by your health care provider.  When you are active, drink fluids that contain electrolytes.  Avoid drinking alcohol.  Keep all follow-up visits as told by your health care provider. This is important. Contact a health care provider if:  You get worse instead of better.  Your symptoms return. Get help right away if you:  Develop severe muscle weakness.  Have trouble breathing.  Feel that your heart is racing. Summary  Hypomagnesemia is a condition in which the level of magnesium in the blood is low.  Hypomagnesemia can affect every organ in the body.  Treatment may include eating more foods that contain magnesium, taking magnesium supplements, and not drinking alcohol.  Have your magnesium levels monitored as told by your health care provider. This information is not intended to replace advice given to you by your health care provider. Make sure you discuss any questions you have with your health care provider. Document Revised: 09/06/2017 Document Reviewed:  08/26/2017 Elsevier Patient Education  2020 [place].   Bone Density Test The bone density test uses a special type of X-ray to measure the amount of calcium and other minerals in your bones. It can measure bone density in the hip and the spine. The test procedure is  similar to having a regular X-ray. This test may also be called:  Bone densitometry.  Bone mineral density test.  Dual-energy X-ray absorptiometry (DEXA). You may have this test to:  Diagnose a condition that causes weak or thin bones (osteoporosis).  Screen you for osteoporosis.  Predict your risk for a broken bone (fracture).  Determine how well your osteoporosis treatment is working. Tell a health care provider about:  Any allergies you have.  All medicines you are taking, including vitamins, herbs, eye drops, creams, and over-the-counter medicines.  Any problems you or family members have had with anesthetic medicines.  Any blood disorders you have.  Any surgeries you have had.  Any medical conditions you have.  Whether you are pregnant or may be pregnant.  Any medical tests you have had within the past 14 days that used contrast material. What are the risks? Generally, this is a safe procedure. However, it does expose you to a small amount of radiation, which can slightly increase your cancer risk. What happens before the procedure?  Do not take any calcium supplements starting 24 hours before your test.  Remove all metal jewelry, eyeglasses, dental appliances, and any other metal objects. What happens during the procedure?   You will lie down on an exam table. There will be an X-ray generator below you and an imaging device above you.  Other devices, such as boxes or braces, may be used to position your body properly for the scan.  The machine will slowly scan your body. You will need to keep still.  The images will show up on a screen in the room. Images will be examined by a specialist after your test is done. The procedure may vary among health care providers and hospitals. What happens after the procedure?  It is up to you to get your test results. Ask your health care provider, or the department that is doing the test, when your results will be  ready. Summary  A bone density test is an imaging test that uses a type of X-ray to measure the amount of calcium and other minerals in your bones.  The test may be used to diagnose or screen you for a condition that causes weak or thin bones (osteoporosis), predict your risk for a broken bone (fracture), or determine how well your osteoporosis treatment is working.  Do not take any calcium supplements starting 24 hours before your test.  Ask your health care provider, or the department that is doing the test, when your results will be ready. This information is not intended to replace advice given to you by your health care provider. Make sure you discuss any questions you have with your health care provider. Document Revised: 10/10/2017 Document Reviewed: 07/29/2017 Elsevier Patient Education  [place].

## 2020-03-17 ENCOUNTER — Encounter: Payer: Self-pay | Admitting: Internal Medicine

## 2020-03-17 LAB — VITAMIN B12: Vitamin B-12: 958 pg/mL (ref 232–1245)

## 2020-03-18 NOTE — Progress Notes (Signed)
This visit occurred during the SARS-CoV-2 public health emergency.  Safety protocols were in place, including screening questions prior to the visit, additional usage of staff PPE, and extensive cleaning of exam room while observing appropriate contact time as indicated for disinfecting solutions.  Subjective:     Patient ID: Danielle Payne , female    DOB: 1945/12/22 , 74 y.o.   MRN: [id number]   Chief Complaint  Patient presents with  . Diabetes    HPI  She is here today for diabetes f/u. She was started on Farxiga after her last visit; however, she did not start the medication. She does not wish to take any pharmaceutical meds if possible.   Diabetes She presents for her follow-up diabetic visit. She has type 2 diabetes mellitus. There are no hypoglycemic associated symptoms. There are no diabetic associated symptoms. There are no hypoglycemic complications.     Past Medical History:  Diagnosis Date  . Allergy   . Asthma   . Atrial fibrillation ([place])   . Chronic combined systolic and diastolic heart failure ([place]) 05/14/2018  . Coronary artery disease   . Diverticulitis   . Diverticulitis   . Hypertension   . Pure hypercholesterolemia 05/14/2018  . Stented coronary artery      Family History  Problem Relation Age of Onset  . CAD Mother   . Stroke Mother   . Hypertension Mother   . Prostate cancer Father   . Diabetes Mellitus II Sister   . Diabetes Mellitus II Brother   . CAD Maternal Aunt      Current Outpatient Medications:  .  albuterol (VENTOLIN HFA) 108 (90 Base) MCG/ACT inhaler, INHALE TWO PUFFS INTO THE LUNGS EVERY 4 HOURS AS NEEDED FOR WHEEZING OR SHORTNESS OF BREATH, Disp: 18 g, Rfl: 1 .  carvedilol (COREG) 25 MG tablet, Take 1 tablet (25 mg total) by mouth 2 (two) times daily with a meal., Disp: 180 tablet, Rfl: 1 .  diltiazem (CARDIZEM CD) 240 MG 24 hr capsule, Take 1 capsule (240 mg total) by mouth daily., Disp: 90 capsule, Rfl: 3 .  diphenhydrAMINE HCl  (BENADRYL ALLERGY PO), Take by mouth., Disp: , Rfl:  .  Melatonin 5 MG TBDP, Take 5 mg by mouth at bedtime. , Disp: , Rfl:  .  NON FORMULARY, Natural nasal spray nedi pot, Disp: , Rfl:  .  NON FORMULARY, nattokinasase blood thinner, Disp: , Rfl:  .  Probiotic Product (PROBIOTIC PO), Take by mouth., Disp: , Rfl:  .  Protease POWD, by Does not apply route., Disp: , Rfl:  .  furosemide (LASIX) 40 MG tablet, Take 40 mg by mouth daily as needed for fluid or edema. , Disp: , Rfl:  .  UBIQUINOL PO, Take 100 mg by mouth., Disp: , Rfl:    No Known Allergies   Review of Systems  Constitutional: Negative.   Respiratory: Negative.   Cardiovascular: Negative.   Gastrointestinal: Negative.   Neurological: Negative.   Psychiatric/Behavioral: Negative.      Today's Vitals   03/16/20 1028  BP: 126/74  Pulse: (!) 47  Temp: 98 F (36.7 C)  TempSrc: Oral  Weight: 197 lb 6.4 oz (89.5 kg)  Height: 5\' 3"  (1.6 m)   Body mass index is 34.97 kg/m.   Objective:  Physical Exam Vitals and nursing note reviewed.  Constitutional:      Appearance: Normal appearance.  HENT:     Head: Normocephalic and atraumatic.  Cardiovascular:     Rate and Rhythm:  Normal rate. Rhythm irregular.     Heart sounds: Normal heart sounds.  Pulmonary:     Effort: Pulmonary effort is normal.     Breath sounds: Normal breath sounds.  Skin:    General: Skin is warm.  Neurological:     General: No focal deficit present.     Mental Status: She is alert.  Psychiatric:        Mood and Affect: Mood normal.        Behavior: Behavior normal.         Assessment And Plan:     1. Diabetes mellitus with stage 2 chronic kidney disease (HCC)  Chronic. Unfortunately, she has yet to start the [place] as prescribed.  She reports she is now ready to start the medication. She was given samples of Farxiga, 10mg  to take once daily. She is advised to take with first meal of the day, and to stay well hydrated.  Pt advised that she  may need Endocrine evaluation in the future if her sugars are not improving. All questions were answered to her satisfaction. She will rto in 4-6 weeks for re-evaluation.   2. Chronic atrial fibrillation (HCC)  Chronic, rate controlled. Unfortunately, she declines to take rx anticoagulation. She will continue to take her "natural" blood-thinner from her naturopathic doctor.   3. Class 1 obesity due to excess calories with serious comorbidity and body mass index (BMI) of 34.0 to 34.9 in adult  She is encouraged to strive for BMI less than 30 to decrease cardiac risk. She is encouraged to strive for BMI less than 30 to decrease cardiac risk.   4. Drug therapy  - Vitamin B12    I personally spent 30 minutes face-to-face and non-face-to-face in the care of this patient, which includes all pre-, intra-, and post visit time on the date of service.  Danielle Greenland, MD    THE PATIENT IS ENCOURAGED TO PRACTICE SOCIAL DISTANCING DUE TO THE COVID-19 PANDEMIC.

## 2020-03-30 NOTE — Telephone Encounter (Signed)
Pt c/o medication issue:  1. Name of Medication:   diltiazem (CARDIZEM CD) 240 MG 24 hr capsule     2. How are you currently taking this medication (dosage and times per day)? 1 capsule (240 mg total) by mouth daily  3. Are you having a reaction (difficulty breathing--STAT)?   4. What is your medication issue? Patient states medication is causing fatigue. Patient is requesting a lower dosage. Please call.

## 2020-03-30 NOTE — Telephone Encounter (Signed)
Spoke to patient she stated she feels like Diltiazem 240 mg is causing her to feel tired,no energy.Stated she wants to go back to 180 mg.Advised I will send message to [place].

## 2020-03-31 MED ORDER — DILTIAZEM HCL ER COATED BEADS 180 MG PO CP24
180.0000 mg | ORAL_CAPSULE | Freq: Every day | ORAL | 3 refills | Status: DC
Start: 2020-03-31 — End: 2021-04-01

## 2020-03-31 NOTE — Telephone Encounter (Signed)
Spoke to patient Dr.[place]'s advice given.Advised to call back if continues to feel tired.

## 2020-03-31 NOTE — Addendum Note (Signed)
Addended by: Kathyrn Lass on: 03/31/2020 03:53 PM   Modules accepted: Orders

## 2020-03-31 NOTE — Telephone Encounter (Signed)
OK to go back to 180mg .

## 2020-04-21 ENCOUNTER — Ambulatory Visit: Payer: Medicare Other | Admitting: Internal Medicine

## 2020-05-10 ENCOUNTER — Encounter: Payer: Medicare Other | Admitting: Internal Medicine

## 2020-05-14 ENCOUNTER — Other Ambulatory Visit: Payer: Self-pay | Admitting: Family Medicine

## 2020-05-18 DIAGNOSIS — M47814 Spondylosis without myelopathy or radiculopathy, thoracic region: Secondary | ICD-10-CM | POA: Diagnosis not present

## 2020-05-18 DIAGNOSIS — M9903 Segmental and somatic dysfunction of lumbar region: Secondary | ICD-10-CM | POA: Diagnosis not present

## 2020-05-18 DIAGNOSIS — M542 Cervicalgia: Secondary | ICD-10-CM | POA: Diagnosis not present

## 2020-05-18 DIAGNOSIS — M9901 Segmental and somatic dysfunction of cervical region: Secondary | ICD-10-CM | POA: Diagnosis not present

## 2020-05-20 NOTE — Progress Notes (Signed)
Erroneous encounter, pt cancelled.

## 2020-05-26 DIAGNOSIS — M9901 Segmental and somatic dysfunction of cervical region: Secondary | ICD-10-CM | POA: Diagnosis not present

## 2020-05-26 DIAGNOSIS — M47814 Spondylosis without myelopathy or radiculopathy, thoracic region: Secondary | ICD-10-CM | POA: Diagnosis not present

## 2020-05-26 DIAGNOSIS — M9903 Segmental and somatic dysfunction of lumbar region: Secondary | ICD-10-CM | POA: Diagnosis not present

## 2020-05-26 DIAGNOSIS — M542 Cervicalgia: Secondary | ICD-10-CM | POA: Diagnosis not present

## 2020-06-03 DIAGNOSIS — H10011 Acute follicular conjunctivitis, right eye: Secondary | ICD-10-CM | POA: Diagnosis not present

## 2020-06-06 DIAGNOSIS — R9431 Abnormal electrocardiogram [ECG] [EKG]: Secondary | ICD-10-CM | POA: Diagnosis not present

## 2020-06-06 DIAGNOSIS — E7800 Pure hypercholesterolemia, unspecified: Secondary | ICD-10-CM | POA: Diagnosis not present

## 2020-06-06 DIAGNOSIS — Z951 Presence of aortocoronary bypass graft: Secondary | ICD-10-CM | POA: Diagnosis not present

## 2020-06-06 DIAGNOSIS — I4891 Unspecified atrial fibrillation: Secondary | ICD-10-CM | POA: Diagnosis not present

## 2020-06-09 DIAGNOSIS — M542 Cervicalgia: Secondary | ICD-10-CM | POA: Diagnosis not present

## 2020-06-09 DIAGNOSIS — M47814 Spondylosis without myelopathy or radiculopathy, thoracic region: Secondary | ICD-10-CM | POA: Diagnosis not present

## 2020-06-09 DIAGNOSIS — M9901 Segmental and somatic dysfunction of cervical region: Secondary | ICD-10-CM | POA: Diagnosis not present

## 2020-06-09 DIAGNOSIS — M9903 Segmental and somatic dysfunction of lumbar region: Secondary | ICD-10-CM | POA: Diagnosis not present

## 2020-06-23 DIAGNOSIS — M47814 Spondylosis without myelopathy or radiculopathy, thoracic region: Secondary | ICD-10-CM | POA: Diagnosis not present

## 2020-06-23 DIAGNOSIS — M9901 Segmental and somatic dysfunction of cervical region: Secondary | ICD-10-CM | POA: Diagnosis not present

## 2020-06-23 DIAGNOSIS — M9903 Segmental and somatic dysfunction of lumbar region: Secondary | ICD-10-CM | POA: Diagnosis not present

## 2020-06-23 DIAGNOSIS — M542 Cervicalgia: Secondary | ICD-10-CM | POA: Diagnosis not present

## 2020-06-30 DIAGNOSIS — H5711 Ocular pain, right eye: Secondary | ICD-10-CM | POA: Diagnosis not present

## 2020-06-30 DIAGNOSIS — H11151 Pinguecula, right eye: Secondary | ICD-10-CM | POA: Diagnosis not present

## 2020-06-30 DIAGNOSIS — H2513 Age-related nuclear cataract, bilateral: Secondary | ICD-10-CM | POA: Diagnosis not present

## 2020-07-07 ENCOUNTER — Telehealth: Payer: Self-pay | Admitting: Cardiovascular Disease

## 2020-07-07 NOTE — Telephone Encounter (Signed)
lvm for patient to return call to get follow up scheduled with [place] from recall list  

## 2020-07-15 ENCOUNTER — Telehealth: Payer: Self-pay | Admitting: Internal Medicine

## 2020-07-15 NOTE — Telephone Encounter (Signed)
I called the patient to schedule AWV-I and follow up appt w/ Dr. Baird Cancer.  She said that her husband had a heart attack and she's not in [place] right now.  She'll call back to schedule.

## 2020-07-23 ENCOUNTER — Inpatient Hospital Stay
Admit: 2020-07-23 | Discharge: 2020-07-23 | Disposition: A | Discharge status: Home / Self Care | Payer: MEDICARE | Attending: Family Medicine

## 2020-07-23 DIAGNOSIS — L723 Sebaceous cyst: Secondary | ICD-10-CM

## 2020-07-23 DIAGNOSIS — L089 Local infection of the skin and subcutaneous tissue, unspecified: Secondary | ICD-10-CM | POA: Diagnosis not present

## 2020-07-23 MED ORDER — CLINDAMYCIN 300 MG CAP
300 mg | ORAL_CAPSULE | Freq: Three times a day (TID) | ORAL | 0 refills | Status: AC
Start: 2020-07-23 — End: 2020-07-30

## 2020-07-23 MED ORDER — LIDOCAINE-EPINEPHRINE 1 %-1:100,000 IJ SOLN
1 %-:00,000 | Freq: Once | INTRAMUSCULAR | Status: AC
Start: 2020-07-23 — End: 2020-07-23
  Administered 2020-07-23: 15:00:00 via INTRADERMAL

## 2020-07-23 MED FILL — XYLOCAINE WITH EPINEPHRINE 1 %-1:100,000 INJECTION SOLUTION: 1 %-:00,000 | INTRAMUSCULAR | Qty: 1.5

## 2020-07-23 NOTE — ED Notes (Signed)
dsd with silk tape to area under rt breast

## 2020-07-23 NOTE — Progress Notes (Signed)
Spoke to patient and given wound culture results. States that she still has some weepy drainage from the wound. Pt instructed that was good as we wanted all the drainage out before the wound closed over but if she was concerned to come back and let us recheck it.

## 2020-07-23 NOTE — Progress Notes (Signed)
Please advise patient that the wound culture did not show any clear single bacteria that is caused the infection.  She had multiple different bacteria which may be responsible for the infection.

## 2020-07-23 NOTE — ED Provider Notes (Addendum)
[facility] Provider Note by Gayland Curry, MD at 07/23/20 [id number]                Author: Sharren Bridge, Estevan Oaks, MD  Service: Urgent Care  Author Type: Physician       Filed: 07/23/20 1042  Date of Service: 07/23/20 0944  Status: Signed          Editor: Gayland Curry, MD (Physician)            Procedure Orders        1. I&D Abcess Complex [[id number]] ordered by Sharren Bridge, Estevan Oaks, MD                              HPI    Pleasant 74 year old woman who states that she has had a cyst on her right breast for years but 2 days ago became red and swollen and is draining foul smelling drainage.   She states she has had a cyst there for many years.  She also has some significant erythema underneath the right breast around the area where it is draining.      Denies fever/chills/night sweats/nausea/vomiting/diarrhea.        Past Medical History:        Diagnosis  Date         ?  A-fib (HCC)       ?  Asthma       ?  CAD (coronary artery disease)           ?  Hypertension                Past Surgical History:         Procedure  Laterality  Date          ?  HX GYN              partial hyst          ?  PR CARDIAC SURG PROCEDURE UNLIST    09/25/2016          CABG, stent 2010             History reviewed. No pertinent family history.         Social History          Socioeconomic History         ?  Marital status:  MARRIED              Spouse name:  Not on file         ?  Number of children:  Not on file     ?  Years of education:  Not on file     ?  Highest education level:  Not on file       Occupational History        ?  Not on file       Tobacco Use         ?  Smoking status:  Never Smoker     ?  Smokeless tobacco:  Never Used       Substance and Sexual Activity         ?  Alcohol use:  No     ?  Drug use:  No     ?  Sexual activity:  Not on file        Other Topics  Concern        ?  Not on file       Social History Narrative        ?  Not on file          Social Determinants of Health          Financial Resource  Strain:         ?  Difficulty of Paying Living Expenses:        Food Insecurity:         ?  Worried About Programme researcher, broadcasting/film/videounning Out of Food in the Last Year:      ?  Baristaan Out of Food in the Last Year:        Transportation Needs:         ?  Freight forwarderLack of Transportation (Medical):      ?  Lack of Transportation (Non-Medical):        Physical Activity:         ?  Days of Exercise per Week:      ?  Minutes of Exercise per Session:        Stress:         ?  Feeling of Stress :        Social Connections:         ?  Frequency of Communication with Friends and Family:      ?  Frequency of Social Gatherings with Friends and Family:      ?  Attends Religious Services:      ?  Active Member of Clubs or Organizations:      ?  Attends BankerClub or Organization Meetings:      ?  Marital Status:        Intimate Partner Violence:         ?  Fear of Current or Ex-Partner:      ?  Emotionally Abused:      ?  Physically Abused:         ?  Sexually Abused:                         ALLERGIES: Patient has no known allergies.      Review of Systems    Constitutional: Negative for activity change, chills and fever.    HENT: Negative for sore throat.     Gastrointestinal: Negative for nausea and vomiting.    Skin: Positive for rash. Negative for wound.         Draining, tender mass right breast    Neurological: Negative for headaches.            Vitals:          07/23/20 0916        BP:  (!) 150/84     Pulse:  98     Resp:  18     Temp:  97.6 ??F (36.4 ??C)        SpO2:  99%           Physical Exam   Vitals and nursing note reviewed.   Constitutional:        General: She is not in acute distress.     Appearance: She is well-developed. She is not diaphoretic.   Musculoskeletal :          General: Normal range of motion.    Skin:      General: Skin is warm and dry.      Findings: Rash present.  Neurological :       Mental Status: She is alert.            MDM                 I&D Abcess Complex      Date/Time: 07/23/2020 10:38 AM   Performed by:  Gayland Curry, MD   Authorized by:  Sharren Bridge, Estevan Oaks, MD       Consent:      Consent obtained:  Verbal     Consent given by:  Patient     Risks discussed:  Bleeding, incomplete drainage, infection and pain     Alternatives discussed:  No treatment   Location:      Type:  Abscess     Location:  Trunk     Trunk location:  R breast   Pre-procedure details:      Skin preparation:  Chloraprep   Anesthesia (see MAR for exact dosages):      Anesthesia method:  Local infiltration     Local anesthetic:  Lidocaine 1% WITH epi   Procedure type:      Complexity:  Complex   Procedure details:      Incision types:  Stab incision     Incision depth:  Subcutaneous     Scalpel blade:  11     Wound management:  Probed and deloculated, irrigated with saline and extensive cleaning     Drainage:  Purulent and bloody     Drainage amount:  Copious     Wound treatment:  Wound left open     Packing materials:  1/4 in iodoform gauze     Amount 1/4" iodoform:  15 inches   Post-procedure details:      Patient tolerance of procedure:  Tolerated well, no immediate complications   Comments:       After achieving good anesthesia with lidocaine and epinephrine made a small incision with a 11. Scalpel blade.  I was able to express a copious amount of purulent discharge as well as thick  sebaceous material.  I used a end of a Q-tip to explore and the loculated area.  There was several loculations which was able to release.  Then used 400 cc of normal saline to irrigate the wound with an 18 gauge needle and 60 cc syringe.  I then placed  iodoform gauze packing in the wound.  Patient tolerated the procedure well.  There was no active bleeding upon completion of the procedure.               Assessment:  Infected sebaceous cyst right breast.  This was a large complex cyst which required significant the loculation and exploration.  I removed a copious amount of purulent discharge and sebaceous material.  Overall patient tolerated the procedure  very well.   Will place her on clindamycin given the overlying cellulitic appearance of the skin.  We also discussed a heating pad to the area.  We also discussed risk of bleeding given that she is on Coumadin.  We will have her follow up in our clinic  in 1 day to have the packing removed.  We discussed that if there is no purulent discharge we may not need to replace the packing at that time period we also discussed that there is a chance that she has a recurrence of the cyst as is not clear whether  we were able to remove any of the cyst wall  itself.  Also discussed if she has any new or worsening symptoms she should call primary care office or our clinic during clinic hours for further guidance or go to the emergency department for further evaluation  management.    We are going to send a wound culture.              Current Discharge Medication List              START taking these medications          Details        clindamycin (CLEOCIN) 300 mg capsule  Take 1 Capsule by mouth three (3) times daily for 7 days.   Qty: 21 Capsule, Refills:  0   Start date: 07/23/2020, End date:  07/30/2020                              Encounter Diagnoses              ICD-10-CM  ICD-9-CM          1.  Infected sebaceous cyst of skin   L72.3  706.2           L08.9

## 2020-07-24 ENCOUNTER — Inpatient Hospital Stay
Admit: 2020-07-24 | Discharge: 2020-07-24 | Disposition: A | Discharge status: Home / Self Care | Payer: MEDICARE | Attending: Emergency Medicine

## 2020-07-24 DIAGNOSIS — L03313 Cellulitis of chest wall: Secondary | ICD-10-CM

## 2020-07-24 DIAGNOSIS — Z09 Encounter for follow-up examination after completed treatment for conditions other than malignant neoplasm: Secondary | ICD-10-CM | POA: Diagnosis not present

## 2020-07-24 MED ORDER — CEFTRIAXONE 1 GRAM SOLUTION FOR INJECTION
1 gram | INTRAMUSCULAR | Status: AC
Start: 2020-07-24 — End: 2020-07-24
  Administered 2020-07-24: 13:00:00 via INTRAVENOUS

## 2020-07-24 MED FILL — CEFTRIAXONE 1 GRAM SOLUTION FOR INJECTION: 1 gram | INTRAMUSCULAR | Qty: 1

## 2020-07-24 NOTE — ED Provider Notes (Signed)
The history is provided by the patient.   Wound Check     The patient is a 74 year old female with AFib, asthma, coronary artery disease, and hypertension who presents/returns to [facility] for recheck of a sebaceous cyst abscess under her right breast that was incised and drained here yesterday.  She has less pain.  No fever.  She has been taking the clindamycin antibiotic.  Prior record reviewed.      Past Medical History:   Diagnosis Date   ??? A-fib (HCC)    ??? Asthma    ??? CAD (coronary artery disease)    ??? Hypertension         Past Surgical History:   Procedure Laterality Date   ??? HX GYN      partial hyst   ??? PR CARDIAC SURG PROCEDURE UNLIST  09/25/2016    CABG, stent 2010         History reviewed. No pertinent family history.     Social History     Socioeconomic History   ??? Marital status: MARRIED     Spouse name: Not on file   ??? Number of children: Not on file   ??? Years of education: Not on file   ??? Highest education level: Not on file   Occupational History   ??? Not on file   Tobacco Use   ??? Smoking status: Never Smoker   ??? Smokeless tobacco: Never Used   Substance and Sexual Activity   ??? Alcohol use: No   ??? Drug use: No   ??? Sexual activity: Not on file   Other Topics Concern   ??? Not on file   Social History Narrative   ??? Not on file     Social Determinants of Health     Financial Resource Strain:    ??? Difficulty of Paying Living Expenses:    Food Insecurity:    ??? Worried About Programme researcher, broadcasting/film/video in the Last Year:    ??? Barista in the Last Year:    Transportation Needs:    ??? Freight forwarder (Medical):    ??? Lack of Transportation (Non-Medical):    Physical Activity:    ??? Days of Exercise per Week:    ??? Minutes of Exercise per Session:    Stress:    ??? Feeling of Stress :    Social Connections:    ??? Frequency of Communication with Friends and Family:    ??? Frequency of Social Gatherings with Friends and Family:    ??? Attends Religious Services:    ??? Database administrator or Organizations:     ??? Attends Engineer, structural:    ??? Marital Status:    Intimate Programme researcher, broadcasting/film/video Violence:    ??? Fear of Current or Ex-Partner:    ??? Emotionally Abused:    ??? Physically Abused:    ??? Sexually Abused:                 ALLERGIES: Patient has no known allergies.    Review of Systems   Constitutional: Negative for fever.   Gastrointestinal: Negative for diarrhea, nausea and vomiting.       Vitals:    07/24/20 0813   BP: (P) 107/63   Pulse: (P) 95   Resp: (P) 16   Temp: (P) 97.8 ??F (36.6 ??C)   SpO2: (P) 99%       Physical Exam  Vitals and nursing note reviewed. Exam conducted  with a chaperone present Cheron Every, RN).   Constitutional:       General: She is not in acute distress.     Appearance: She is not ill-appearing or diaphoretic.   Pulmonary:      Effort: Pulmonary effort is normal.   Skin:     General: Skin is warm and dry.      Comments: The undersurface of the right breast and onto the chest shows a roughly 10 x 10 cm area of erythema with a central incision site and packing.  The area is not edematous.  It is minimally tender.  The packing was removed.  No significant purulence was expressed.  Minimal bleeding.  New dressing by nursing staff.   Neurological:      Mental Status: She is alert and oriented to person, place, and time.   Psychiatric:         Mood and Affect: Mood normal.         MDM  Number of Diagnoses or Management Options  Cellulitis of chest wall  Encounter for recheck of abscess following incision and drainage  Diagnosis management comments: The patient has a sebaceous cyst/abscess with secondary cellulitis.  The cellulitis may be little bit worse than it was yesterday, according to our RN.  Therefore, the patient will be given 1 g of ceftriaxone IV here.  She will continue the clindamycin.  She is to begin warm moist compresses.  She is to return here tomorrow morning for recheck.  She takes her warfarin intermittently.  She is visiting from [place] and will not be returning for  couple of more weeks as her husband had a heart attack while up here.  We will have to arrange for her PT/INR to be checked tomorrow.  We may be able to work with our office practice Coumadin clinic to determined dosing adjustment.  The patient is to go the hospital emergency department for significant worsening symptoms.    I have discussed the working diagnosis, treatment, precautions and follow up with the patient/guardian.  It was explained to them that, in some circumstances, symptoms can change over time, resulting in the need for further evaluation and an ultimate different diagnosis from today's assessment.  The patient/guardian was made aware of signs and symptoms to look for, in the event of worsening condition, that would necessitate going to the hospital emergency department for further evaluation and management.   They were given the opportunity to ask questions.  Those questions were answered to the best of my ability with the information available at this time.  They indicated acknowledgement of this discussion.  They appeared to exhibit decisional capacity.             Procedures          ICD-10-CM ICD-9-CM   1. Encounter for recheck of abscess following incision and drainage  Z09 V67.09   2. Cellulitis of chest wall  L03.313 682.2

## 2020-07-24 NOTE — ED Notes (Signed)
DSD to wound under rt breast, redness marked with skin marker, IV wrapped with coban for home to return in the AM

## 2020-07-25 ENCOUNTER — Inpatient Hospital Stay
Admit: 2020-07-25 | Discharge: 2020-07-25 | Disposition: A | Discharge status: Home / Self Care | Payer: MEDICARE | Attending: Family Medicine

## 2020-07-25 DIAGNOSIS — L720 Epidermal cyst: Secondary | ICD-10-CM

## 2020-07-25 DIAGNOSIS — Z09 Encounter for follow-up examination after completed treatment for conditions other than malignant neoplasm: Secondary | ICD-10-CM | POA: Diagnosis not present

## 2020-07-25 LAB — CULTURE, WOUND W GRAM STAIN

## 2020-07-25 MED ORDER — CEFTRIAXONE 1 GRAM SOLUTION FOR INJECTION
1 gram | INTRAMUSCULAR | Status: AC
Start: 2020-07-25 — End: 2020-07-25
  Administered 2020-07-25: 19:00:00 via INTRAVENOUS

## 2020-07-25 MED FILL — CEFTRIAXONE 1 GRAM SOLUTION FOR INJECTION: 1 gram | INTRAMUSCULAR | Qty: 1

## 2020-07-25 NOTE — ED Provider Notes (Signed)
This patient's your visits on the 16th and 17th of this month were reviewed.  She presents today for re-evaluation of the abscess to the base of the right breast associated with the longstanding epidermal inclusion cyst that the patient was aware.  The patient is taking clindamycin 3 times a day and tolerating this well without diarrhea, nausea or vomiting.  The patient received IV Rocephin yesterday and she will receive another dose of IV Rocephin today.  The patient is without fevers, rigors, shakes, sweats, nausea, vomiting, diarrhea, etcetera.  The patient is traveling back to [place] in 2 and half weeks.  She has a history of atrial fibrillation with anticoagulation with Coumadin.  She denies any bleeding problems.           Past Medical History:   Diagnosis Date   ??? A-fib (HCC)    ??? Asthma    ??? CAD (coronary artery disease)    ??? Hypertension         Past Surgical History:   Procedure Laterality Date   ??? HX GYN      partial hyst   ??? PR CARDIAC SURG PROCEDURE UNLIST  09/25/2016    CABG, stent 2010         History reviewed. No pertinent family history.     Social History     Socioeconomic History   ??? Marital status: MARRIED     Spouse name: Not on file   ??? Number of children: Not on file   ??? Years of education: Not on file   ??? Highest education level: Not on file   Occupational History   ??? Not on file   Tobacco Use   ??? Smoking status: Never Smoker   ??? Smokeless tobacco: Never Used   Substance and Sexual Activity   ??? Alcohol use: No   ??? Drug use: No   ??? Sexual activity: Not on file   Other Topics Concern   ??? Not on file   Social History Narrative   ??? Not on file     Social Determinants of Health     Financial Resource Strain:    ??? Difficulty of Paying Living Expenses:    Food Insecurity:    ??? Worried About Programme researcher, broadcasting/film/video in the Last Year:    ??? Barista in the Last Year:    Transportation Needs:    ??? Freight forwarder (Medical):    ??? Lack of Transportation (Non-Medical):    Physical  Activity:    ??? Days of Exercise per Week:    ??? Minutes of Exercise per Session:    Stress:    ??? Feeling of Stress :    Social Connections:    ??? Frequency of Communication with Friends and Family:    ??? Frequency of Social Gatherings with Friends and Family:    ??? Attends Religious Services:    ??? Database administrator or Organizations:    ??? Attends Engineer, structural:    ??? Marital Status:    Intimate Programme researcher, broadcasting/film/video Violence:    ??? Fear of Current or Ex-Partner:    ??? Emotionally Abused:    ??? Physically Abused:    ??? Sexually Abused:                 ALLERGIES: Patient has no known allergies.    Review of Systems   Constitutional: Negative for appetite change, chills, diaphoresis, fatigue and fever.   HENT: Negative  for congestion and sore throat.    Respiratory: Negative for cough, shortness of breath and wheezing.    Cardiovascular: Negative for chest pain and palpitations.   Gastrointestinal: Negative for abdominal pain, diarrhea, nausea and vomiting.   Genitourinary: Negative for difficulty urinating.   Neurological: Negative for syncope and headaches.   Psychiatric/Behavioral: Negative for confusion.   All other systems reviewed and are negative.      Vitals:    07/25/20 1354 07/25/20 1358   BP:  (!) 178/90   Pulse:  89   Resp:  14   Temp:  98.1 ??F (36.7 ??C)   SpO2:  99%   Weight: 86.2 kg (190 lb)        Physical Exam  Vitals and nursing note reviewed.   Constitutional:       General: She is not in acute distress.     Appearance: Normal appearance. She is obese. She is not ill-appearing, toxic-appearing or diaphoretic.   HENT:      Head: Normocephalic and atraumatic.      Right Ear: External ear normal.      Left Ear: External ear normal.      Nose: No rhinorrhea.      Mouth/Throat:      Mouth: Mucous membranes are moist.      Pharynx: Oropharynx is clear.   Eyes:      General: No scleral icterus.     Extraocular Movements: Extraocular movements intact.      Conjunctiva/sclera: Conjunctivae normal.    Cardiovascular:      Rate and Rhythm: Normal rate.      Pulses: Normal pulses.   Pulmonary:      Effort: Pulmonary effort is normal.   Abdominal:      General: Bowel sounds are normal.      Palpations: Abdomen is soft.      Tenderness: There is no abdominal tenderness.   Musculoskeletal:         General: Normal range of motion.      Cervical back: Normal range of motion and neck supple. No tenderness.      Right lower leg: No edema.      Left lower leg: No edema.   Lymphadenopathy:      Cervical: No cervical adenopathy.   Skin:     General: Skin is warm.      Capillary Refill: Capillary refill takes less than 2 seconds.      Findings: Erythema present.      Comments: At the base of the right breast there is an approximately 1 cm incision site with scant amount of brownish discharge with induration measuring approximately 6 x 6 cm to the area without tenderness.  There is no erythema around the incision site but about 8 cm away from the incision site there is a small area approximately 2 x 3 cm with some residual erythema without tenderness or associated induration.  There are no pustules or vesicles noted.  There is no desquamation of the skin.   Neurological:      General: No focal deficit present.      Mental Status: She is alert and oriented to person, place, and time. Mental status is at baseline.      Cranial Nerves: No cranial nerve deficit.   Psychiatric:         Mood and Affect: Mood normal.         Behavior: Behavior normal.         Thought Content:  Thought content normal.         Judgment: Judgment normal.         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  An incision and drainage was performed 48 hours ago and the patient was started on clindamycin.  Subsequently she received a dose of Rocephin yesterday and another dose today.  The induration around the incision site and 6 x 6 cm induration area has resolved.  There is no tenderness to the breast.  There is a little erythema laterally to the incision site by 8 cm measuring about 2 x 2 cm.  The patient will finish the clindamycin.  She will take a probiotic on a daily basis for the next several months to prevent C difficile enteritis.  The patient is aware that the cyst will need to be surgically removed by her surgeon in 4-6 weeks once she is return to [place].  She is to call her PCP today to set up a surgical referral/appointment.  She will finish the course of clindamycin and return if having any other problems.  She is to have her blood pressure checked on a regular basis and further manage by her PCP.  The patient voiced understanding of these discharge instructions.  She understands the surgical site will drain for couple more days.             Procedures                     ICD-10-CM ICD-9-CM    1. Epidermal inclusion cyst  L72.0 706.2    2. Encounter for recheck of abscess following incision and drainage  Z09 V67.09    3. Inclusion cyst of right breast  L72.0 610.8    4. Essential hypertension  I10 401.9

## 2020-08-08 ENCOUNTER — Telehealth: Payer: Self-pay | Admitting: Cardiovascular Disease

## 2020-08-08 NOTE — Telephone Encounter (Signed)
Spoke with patient about scheduling f/u with Dr. Oval Linsey. She informed me she is switching cardiologist

## 2020-08-23 DIAGNOSIS — I10 Essential (primary) hypertension: Secondary | ICD-10-CM | POA: Diagnosis not present

## 2020-08-23 DIAGNOSIS — E1165 Type 2 diabetes mellitus with hyperglycemia: Secondary | ICD-10-CM | POA: Diagnosis not present

## 2020-08-23 DIAGNOSIS — R5383 Other fatigue: Secondary | ICD-10-CM | POA: Diagnosis not present

## 2020-08-23 DIAGNOSIS — E7800 Pure hypercholesterolemia, unspecified: Secondary | ICD-10-CM | POA: Diagnosis not present

## 2020-08-23 DIAGNOSIS — Z7185 Encounter for immunization safety counseling: Secondary | ICD-10-CM | POA: Diagnosis not present

## 2020-08-25 DIAGNOSIS — M47814 Spondylosis without myelopathy or radiculopathy, thoracic region: Secondary | ICD-10-CM | POA: Diagnosis not present

## 2020-08-25 DIAGNOSIS — M9903 Segmental and somatic dysfunction of lumbar region: Secondary | ICD-10-CM | POA: Diagnosis not present

## 2020-08-25 DIAGNOSIS — M9901 Segmental and somatic dysfunction of cervical region: Secondary | ICD-10-CM | POA: Diagnosis not present

## 2020-08-25 DIAGNOSIS — M542 Cervicalgia: Secondary | ICD-10-CM | POA: Diagnosis not present

## 2020-09-03 ENCOUNTER — Other Ambulatory Visit: Payer: Self-pay | Admitting: Internal Medicine

## 2020-09-13 ENCOUNTER — Other Ambulatory Visit: Payer: Self-pay | Admitting: Internal Medicine

## 2020-09-15 DIAGNOSIS — M47814 Spondylosis without myelopathy or radiculopathy, thoracic region: Secondary | ICD-10-CM | POA: Diagnosis not present

## 2020-09-15 DIAGNOSIS — M9903 Segmental and somatic dysfunction of lumbar region: Secondary | ICD-10-CM | POA: Diagnosis not present

## 2020-09-15 DIAGNOSIS — M542 Cervicalgia: Secondary | ICD-10-CM | POA: Diagnosis not present

## 2020-09-15 DIAGNOSIS — M9901 Segmental and somatic dysfunction of cervical region: Secondary | ICD-10-CM | POA: Diagnosis not present

## 2020-10-21 IMAGING — CT CT ABDOMEN AND PELVIS WITH CONTRAST
2 of 5 series · 16 of 46 positions shown, 18 images · IV contrast (ISOVUE)
Comparison: None.

CLINICAL DATA: Abdominal pain

EXAM:
CT ABDOMEN AND PELVIS WITH CONTRAST
TECHNIQUE: Multidetector CT imaging of the abdomen and pelvis was performed
using the standard protocol following bolus administration of
intravenous contrast.
CONTRAST:  100mL OMNIPAQUE IOHEXOL 300 MG/ML  SOLN

[Series 2: axial st · axial · 0.76mm/px · z∈[-539,-164]mm · 13 of 87 slices shown, 15 images]
[im 6/87  soft-tissue]
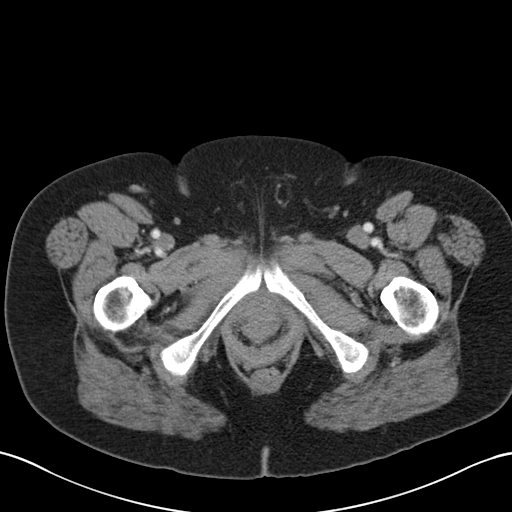
[im 6/87  bone]
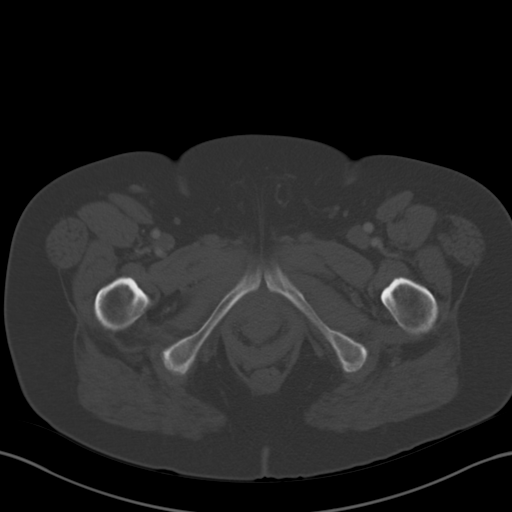
[im 12/87  soft-tissue]
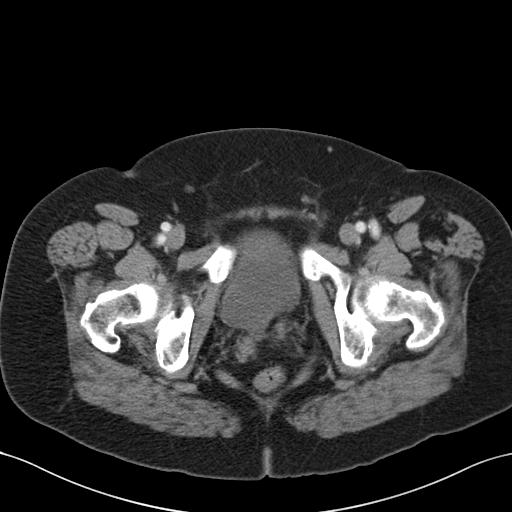
[im 18/87  soft-tissue]
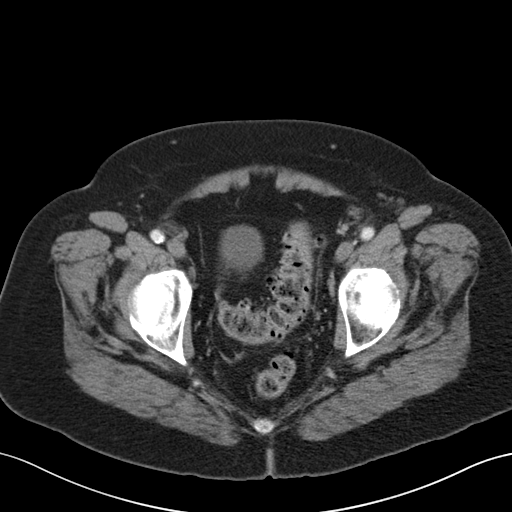
[im 23/87  soft-tissue]
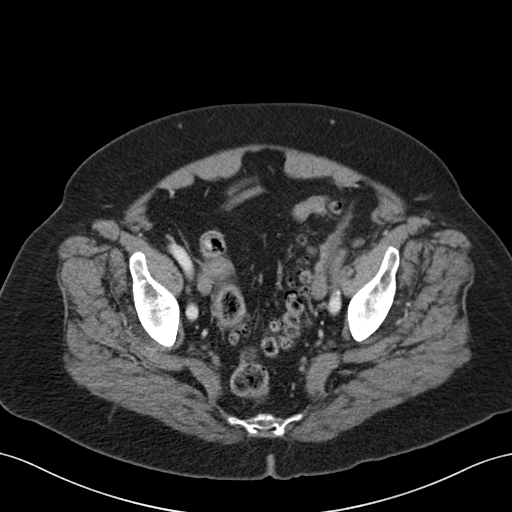
[im 29/87  soft-tissue]
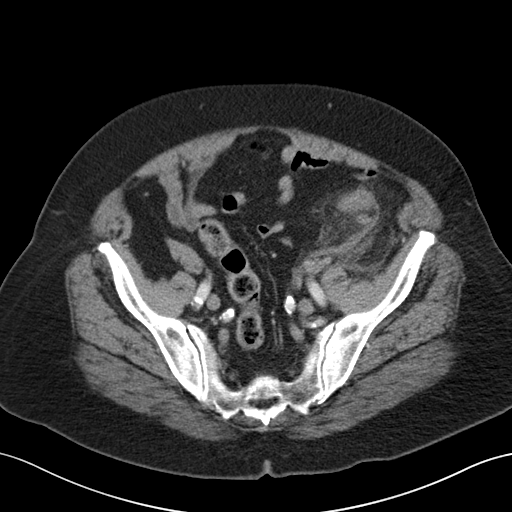
[im 35/87  soft-tissue]
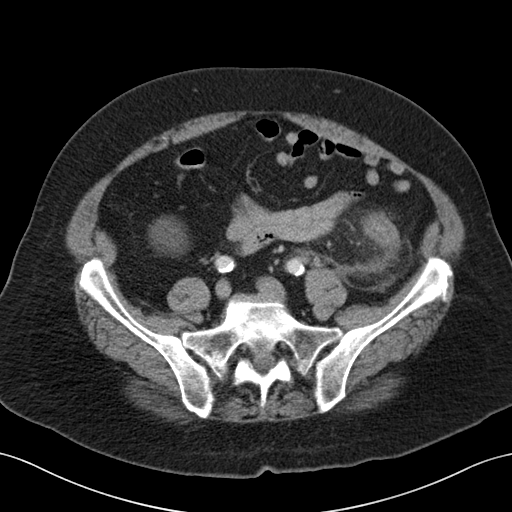
[im 46/87  soft-tissue]
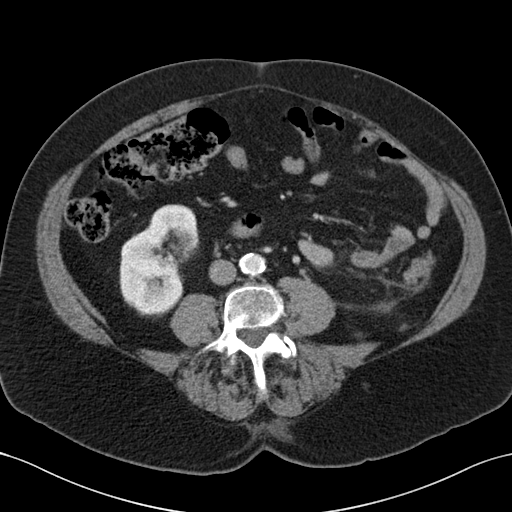
[im 52/87  soft-tissue]
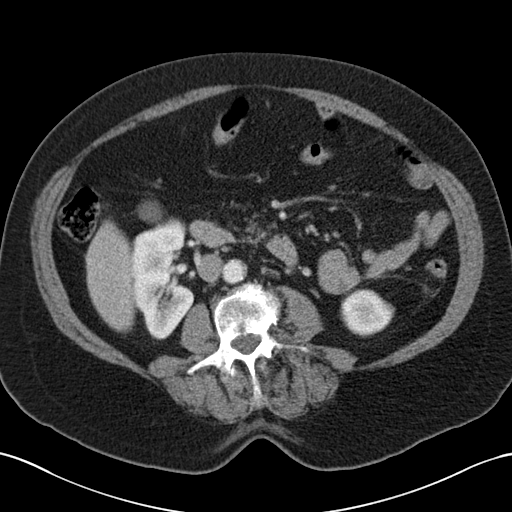
[im 58/87  soft-tissue]
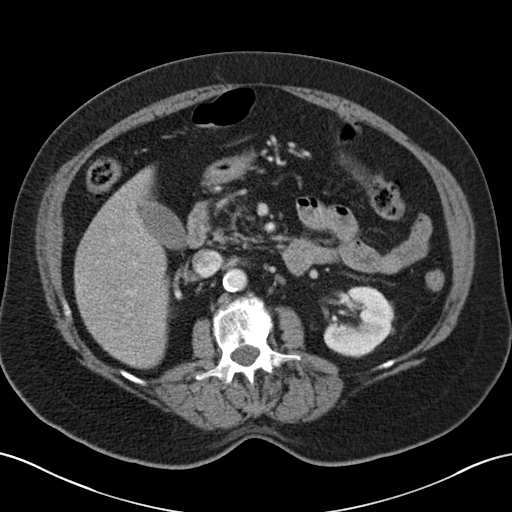
[im 58/87  bone]
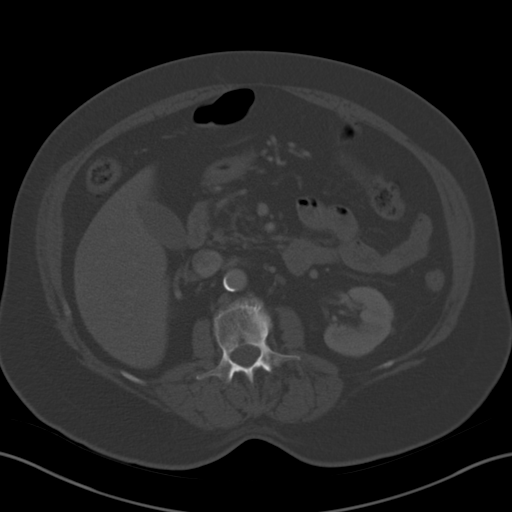
[im 64/87  soft-tissue]
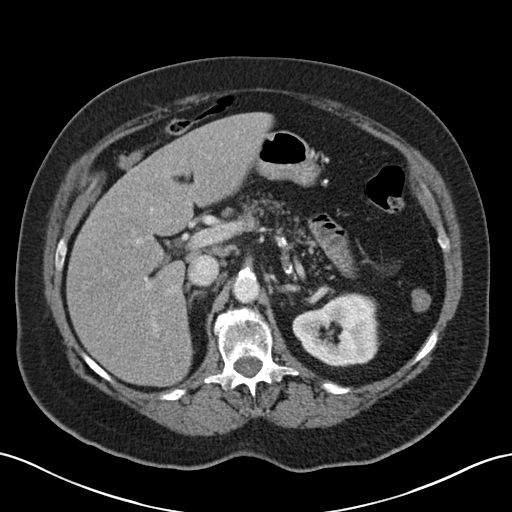
[im 69/87  soft-tissue]
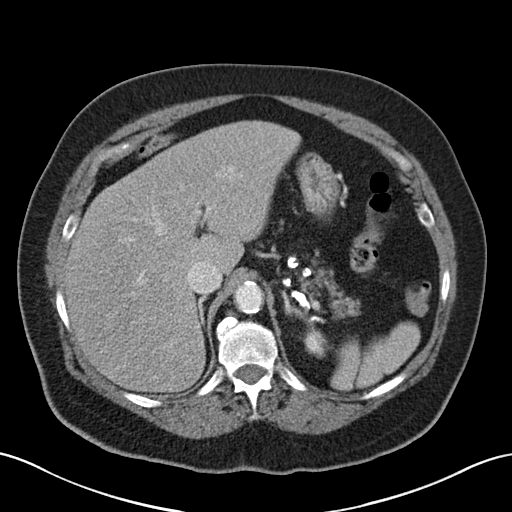
[im 75/87  soft-tissue]
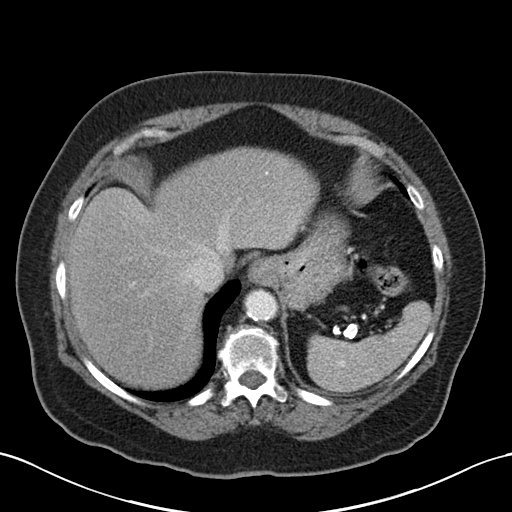
[im 81/87  soft-tissue]
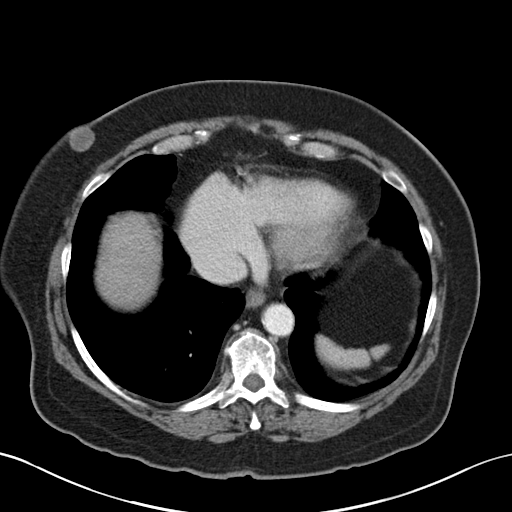

[Series 5: coronal st · coronal · 0.84mm/px · 3 of 157 slices shown]
[im 53/157  soft-tissue]
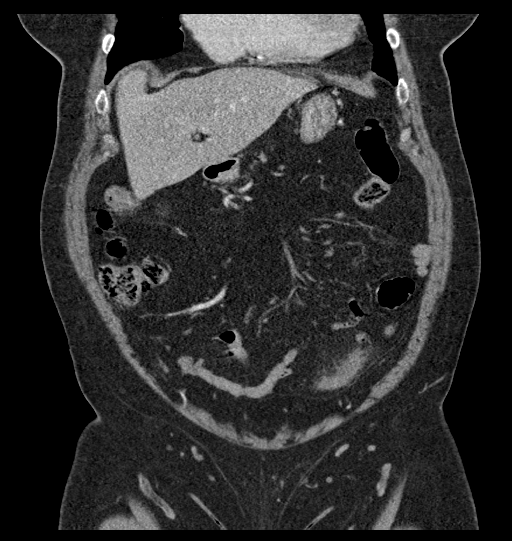
[im 70/157  soft-tissue]
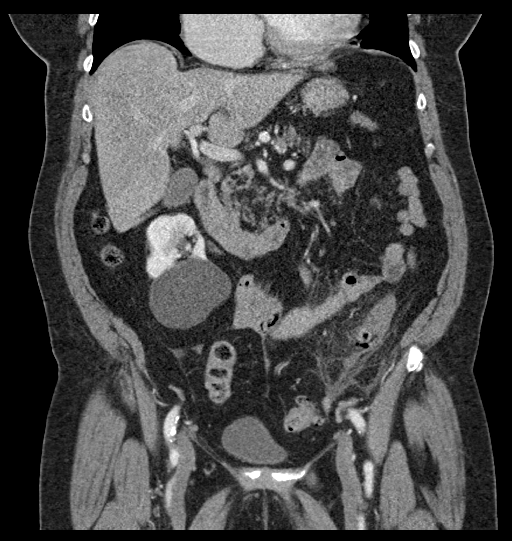
[im 87/157  soft-tissue]
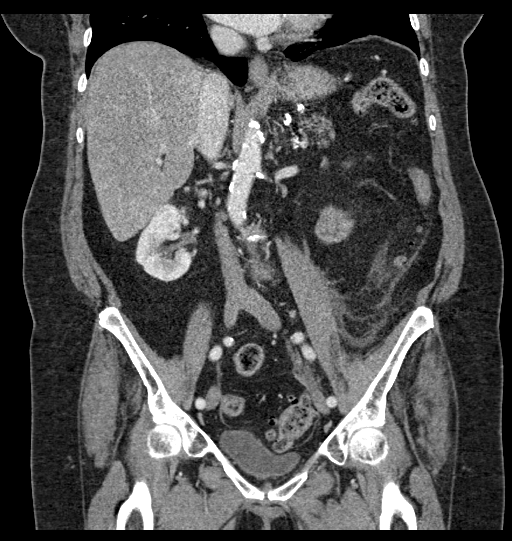

[16 of 46 positions shown; findings below may reference images not displayed]

FINDINGS: Lower chest: No acute abnormality.  Coronary artery calcifications.

Hepatobiliary: No solid liver abnormality is seen. No gallstones,
gallbladder wall thickening, or biliary dilatation.

Pancreas: Unremarkable. No pancreatic ductal dilatation or
surrounding inflammatory changes.

Spleen: Normal in size without significant abnormality.

Adrenals/Urinary Tract: Adrenal glands are unremarkable. Kidneys are
normal, without renal calculi, solid lesion, or hydronephrosis.
Bladder is unremarkable.

Stomach/Bowel: Stomach is within normal limits. Appendix appears
normal. There is severe descending and sigmoid diverticulosis with
extensive wall thickening, fat stranding, and inflammatory fluid
about the proximal sigmoid. There is a small contained perforation
and phlegmon about the posterior proximal sigmoid (series 2, image
56, series 6, image 143).

Vascular/Lymphatic: Aortic atherosclerosis. No enlarged abdominal or
pelvic lymph nodes.

Reproductive: Status post hysterectomy.

Other: No abdominal wall hernia. There is a fluid attenuation
superficial subcutaneous cyst or nodule of the inframammary right
chest (series 2, image 7). No abdominopelvic ascites.

Musculoskeletal: No acute or significant osseous findings.
IMPRESSION: 1. There is severe descending and sigmoid diverticulosis with
extensive wall thickening, fat stranding, and inflammatory fluid
about the proximal sigmoid. There is a small contained perforation
and phlegmon about the posterior proximal sigmoid (series 2, image
56, series 6, image 143). Findings are consistent with acute
diverticulitis complicated by contained perforation. No evidence of
overt perforation or overt abscess formation.

2. There is a fluid attenuation superficial subcutaneous cyst or
nodule of the inframammary right chest (series 2, image 7).
Correlate with physical examination for boil, inclusion cyst, or
other cutaneous finding. Targeted ultrasound may be used to further
characterize if desired.

3. Other chronic, incidental, and postoperative findings as detailed
above.

## 2020-10-27 ENCOUNTER — Telehealth: Payer: Self-pay | Admitting: Internal Medicine

## 2020-10-27 NOTE — Telephone Encounter (Signed)
Left message for patient to call back and schedule Medicare Annual Wellness Visit (AWV) .   Last AWV no information  please schedule at anytime with [facility]    This should be a 45 minute visit.

## 2020-12-12 ENCOUNTER — Telehealth: Payer: Self-pay | Admitting: Internal Medicine

## 2020-12-12 NOTE — Telephone Encounter (Signed)
Left message for patient to call back and schedule Medicare Annual Wellness Visit (AWV) either virtually or in office. No detailed message left   AWVI  please schedule at anytime with [facility]    This should be a 45 minute visit.

## 2021-03-20 ENCOUNTER — Encounter: Payer: Self-pay | Admitting: Internal Medicine

## 2021-03-20 ENCOUNTER — Telehealth: Payer: Self-pay | Admitting: Internal Medicine

## 2021-03-20 NOTE — Telephone Encounter (Signed)
Left message for patient to call back and schedule Medicare Annual Wellness Visit (AWV) either virtually or in office.   AWV-I PER [place] 07/08/10  please schedule at anytime with [facility]    This should be a 45 minute visit.

## 2021-03-23 ENCOUNTER — Emergency Department (HOSPITAL_COMMUNITY): Payer: Medicare Other

## 2021-03-23 ENCOUNTER — Inpatient Hospital Stay (HOSPITAL_COMMUNITY)
Admission: EM | Admit: 2021-03-23 | Discharge: 2021-04-01 | DRG: 061 | Disposition: A | Discharge status: Home Health | Payer: Medicare Other | Attending: Neurology | Admitting: Neurology

## 2021-03-23 DIAGNOSIS — D72829 Elevated white blood cell count, unspecified: Secondary | ICD-10-CM | POA: Diagnosis present

## 2021-03-23 DIAGNOSIS — I6300 Cerebral infarction due to thrombosis of unspecified precerebral artery: Secondary | ICD-10-CM | POA: Diagnosis not present

## 2021-03-23 DIAGNOSIS — Z951 Presence of aortocoronary bypass graft: Secondary | ICD-10-CM

## 2021-03-23 DIAGNOSIS — I4891 Unspecified atrial fibrillation: Secondary | ICD-10-CM | POA: Diagnosis present

## 2021-03-23 DIAGNOSIS — I4819 Other persistent atrial fibrillation: Secondary | ICD-10-CM | POA: Diagnosis present

## 2021-03-23 DIAGNOSIS — G9341 Metabolic encephalopathy: Secondary | ICD-10-CM | POA: Diagnosis not present

## 2021-03-23 DIAGNOSIS — I25119 Atherosclerotic heart disease of native coronary artery with unspecified angina pectoris: Secondary | ICD-10-CM | POA: Diagnosis not present

## 2021-03-23 DIAGNOSIS — T380X5A Adverse effect of glucocorticoids and synthetic analogues, initial encounter: Secondary | ICD-10-CM | POA: Diagnosis present

## 2021-03-23 DIAGNOSIS — I2510 Atherosclerotic heart disease of native coronary artery without angina pectoris: Secondary | ICD-10-CM | POA: Diagnosis present

## 2021-03-23 DIAGNOSIS — E7800 Pure hypercholesterolemia, unspecified: Secondary | ICD-10-CM | POA: Diagnosis present

## 2021-03-23 DIAGNOSIS — E870 Hyperosmolality and hypernatremia: Secondary | ICD-10-CM | POA: Diagnosis present

## 2021-03-23 DIAGNOSIS — Z9119 Patient's noncompliance with other medical treatment and regimen: Secondary | ICD-10-CM

## 2021-03-23 DIAGNOSIS — Z66 Do not resuscitate: Secondary | ICD-10-CM | POA: Diagnosis not present

## 2021-03-23 DIAGNOSIS — J441 Chronic obstructive pulmonary disease with (acute) exacerbation: Secondary | ICD-10-CM | POA: Diagnosis present

## 2021-03-23 DIAGNOSIS — R739 Hyperglycemia, unspecified: Secondary | ICD-10-CM | POA: Diagnosis not present

## 2021-03-23 DIAGNOSIS — I130 Hypertensive heart and chronic kidney disease with heart failure and stage 1 through stage 4 chronic kidney disease, or unspecified chronic kidney disease: Secondary | ICD-10-CM | POA: Diagnosis present

## 2021-03-23 DIAGNOSIS — I6389 Other cerebral infarction: Secondary | ICD-10-CM | POA: Diagnosis not present

## 2021-03-23 DIAGNOSIS — R0902 Hypoxemia: Secondary | ICD-10-CM

## 2021-03-23 DIAGNOSIS — J4551 Severe persistent asthma with (acute) exacerbation: Secondary | ICD-10-CM | POA: Diagnosis not present

## 2021-03-23 DIAGNOSIS — N189 Chronic kidney disease, unspecified: Secondary | ICD-10-CM | POA: Diagnosis present

## 2021-03-23 DIAGNOSIS — I161 Hypertensive emergency: Secondary | ICD-10-CM | POA: Diagnosis not present

## 2021-03-23 DIAGNOSIS — R4701 Aphasia: Secondary | ICD-10-CM | POA: Diagnosis present

## 2021-03-23 DIAGNOSIS — E876 Hypokalemia: Secondary | ICD-10-CM | POA: Diagnosis present

## 2021-03-23 DIAGNOSIS — Z79899 Other long term (current) drug therapy: Secondary | ICD-10-CM

## 2021-03-23 DIAGNOSIS — I63412 Cerebral infarction due to embolism of left middle cerebral artery: Secondary | ICD-10-CM | POA: Diagnosis present

## 2021-03-23 DIAGNOSIS — G8191 Hemiplegia, unspecified affecting right dominant side: Secondary | ICD-10-CM | POA: Diagnosis present

## 2021-03-23 DIAGNOSIS — J45901 Unspecified asthma with (acute) exacerbation: Secondary | ICD-10-CM | POA: Diagnosis present

## 2021-03-23 DIAGNOSIS — R410 Disorientation, unspecified: Secondary | ICD-10-CM | POA: Diagnosis not present

## 2021-03-23 DIAGNOSIS — I4821 Permanent atrial fibrillation: Secondary | ICD-10-CM | POA: Diagnosis not present

## 2021-03-23 DIAGNOSIS — Z7901 Long term (current) use of anticoagulants: Secondary | ICD-10-CM

## 2021-03-23 DIAGNOSIS — R4702 Dysphasia: Secondary | ICD-10-CM | POA: Diagnosis present

## 2021-03-23 DIAGNOSIS — K117 Disturbances of salivary secretion: Secondary | ICD-10-CM | POA: Diagnosis present

## 2021-03-23 DIAGNOSIS — E1122 Type 2 diabetes mellitus with diabetic chronic kidney disease: Secondary | ICD-10-CM | POA: Diagnosis present

## 2021-03-23 DIAGNOSIS — E860 Dehydration: Secondary | ICD-10-CM | POA: Diagnosis present

## 2021-03-23 DIAGNOSIS — E785 Hyperlipidemia, unspecified: Secondary | ICD-10-CM | POA: Diagnosis present

## 2021-03-23 DIAGNOSIS — Z9114 Patient's other noncompliance with medication regimen: Secondary | ICD-10-CM

## 2021-03-23 DIAGNOSIS — Z8249 Family history of ischemic heart disease and other diseases of the circulatory system: Secondary | ICD-10-CM

## 2021-03-23 DIAGNOSIS — E1165 Type 2 diabetes mellitus with hyperglycemia: Secondary | ICD-10-CM | POA: Diagnosis present

## 2021-03-23 DIAGNOSIS — Z823 Family history of stroke: Secondary | ICD-10-CM

## 2021-03-23 DIAGNOSIS — J9601 Acute respiratory failure with hypoxia: Secondary | ICD-10-CM | POA: Diagnosis present

## 2021-03-23 DIAGNOSIS — Z20822 Contact with and (suspected) exposure to covid-19: Secondary | ICD-10-CM | POA: Diagnosis present

## 2021-03-23 DIAGNOSIS — G9349 Other encephalopathy: Secondary | ICD-10-CM | POA: Diagnosis present

## 2021-03-23 DIAGNOSIS — E1169 Type 2 diabetes mellitus with other specified complication: Secondary | ICD-10-CM | POA: Diagnosis not present

## 2021-03-23 DIAGNOSIS — N179 Acute kidney failure, unspecified: Secondary | ICD-10-CM | POA: Diagnosis not present

## 2021-03-23 DIAGNOSIS — D631 Anemia in chronic kidney disease: Secondary | ICD-10-CM | POA: Diagnosis present

## 2021-03-23 DIAGNOSIS — Z90710 Acquired absence of both cervix and uterus: Secondary | ICD-10-CM

## 2021-03-23 DIAGNOSIS — I5042 Chronic combined systolic (congestive) and diastolic (congestive) heart failure: Secondary | ICD-10-CM | POA: Diagnosis present

## 2021-03-23 DIAGNOSIS — R29810 Facial weakness: Secondary | ICD-10-CM | POA: Diagnosis present

## 2021-03-23 DIAGNOSIS — G936 Cerebral edema: Secondary | ICD-10-CM | POA: Diagnosis not present

## 2021-03-23 DIAGNOSIS — I639 Cerebral infarction, unspecified: Secondary | ICD-10-CM | POA: Diagnosis not present

## 2021-03-23 DIAGNOSIS — G9340 Encephalopathy, unspecified: Secondary | ICD-10-CM | POA: Diagnosis not present

## 2021-03-23 DIAGNOSIS — Z4659 Encounter for fitting and adjustment of other gastrointestinal appliance and device: Secondary | ICD-10-CM

## 2021-03-23 DIAGNOSIS — I10 Essential (primary) hypertension: Secondary | ICD-10-CM | POA: Diagnosis not present

## 2021-03-23 DIAGNOSIS — R4182 Altered mental status, unspecified: Secondary | ICD-10-CM | POA: Diagnosis not present

## 2021-03-23 DIAGNOSIS — R0603 Acute respiratory distress: Secondary | ICD-10-CM

## 2021-03-23 DIAGNOSIS — Z8673 Personal history of transient ischemic attack (TIA), and cerebral infarction without residual deficits: Secondary | ICD-10-CM

## 2021-03-23 DIAGNOSIS — R509 Fever, unspecified: Secondary | ICD-10-CM

## 2021-03-23 DIAGNOSIS — R1310 Dysphagia, unspecified: Secondary | ICD-10-CM | POA: Diagnosis present

## 2021-03-23 DIAGNOSIS — Z955 Presence of coronary angioplasty implant and graft: Secondary | ICD-10-CM

## 2021-03-23 DIAGNOSIS — Z8042 Family history of malignant neoplasm of prostate: Secondary | ICD-10-CM

## 2021-03-23 DIAGNOSIS — Z833 Family history of diabetes mellitus: Secondary | ICD-10-CM

## 2021-03-23 DIAGNOSIS — K219 Gastro-esophageal reflux disease without esophagitis: Secondary | ICD-10-CM | POA: Diagnosis present

## 2021-03-23 LAB — COMPREHENSIVE METABOLIC PANEL
ALT: 23 U/L (ref 0–44)
AST: 24 U/L (ref 15–41)
Albumin: 3.8 g/dL (ref 3.5–5.0)
Alkaline Phosphatase: 88 U/L (ref 38–126)
Anion gap: 11 (ref 5–15)
BUN: 16 mg/dL (ref 8–23)
CO2: 23 mmol/L (ref 22–32)
Calcium: 9.2 mg/dL (ref 8.9–10.3)
Chloride: 101 mmol/L (ref 98–111)
Creatinine, Ser: 0.9 mg/dL (ref 0.44–1.00)
GFR, Estimated: >60 mL/min (ref >60)
Glucose, Bld: 217 mg/dL — ABNORMAL HIGH (ref 70–99)
Potassium: 3.7 mmol/L (ref 3.5–5.1)
Sodium: 135 mmol/L (ref 135–145)
Total Bilirubin: 0.6 mg/dL (ref 0.3–1.2)
Total Protein: 7.4 g/dL (ref 6.5–8.1)

## 2021-03-23 LAB — RESP PANEL BY RT-PCR (FLU A&B, COVID) ARPGX2
Influenza A by PCR: NEGATIVE
Influenza B by PCR: NEGATIVE
SARS Coronavirus 2 by RT PCR: NEGATIVE

## 2021-03-23 LAB — CBC
HCT: 46 % (ref 36.0–46.0)
Hemoglobin: 15.1 g/dL — ABNORMAL HIGH (ref 12.0–15.0)
MCH: 30.2 pg (ref 26.0–34.0)
MCHC: 32.8 g/dL (ref 30.0–36.0)
MCV: 92 fL (ref 80.0–100.0)
Platelets: 233 10*3/uL (ref 150–400)
RBC: 5 MIL/uL (ref 3.87–5.11)
RDW: 12.5 % (ref 11.5–15.5)
WBC: 8.8 10*3/uL (ref 4.0–10.5)
nRBC: 0 % (ref 0.0–0.2)

## 2021-03-23 LAB — DIFFERENTIAL
Abs Immature Granulocytes: 0.03 10*3/uL (ref 0.00–0.07)
Basophils Absolute: 0.1 10*3/uL (ref 0.0–0.1)
Basophils Relative: 1 %
Eosinophils Absolute: 0.2 10*3/uL (ref 0.0–0.5)
Eosinophils Relative: 2 %
Immature Granulocytes: 0 %
Lymphocytes Relative: 33 %
Lymphs Abs: 2.9 10*3/uL (ref 0.7–4.0)
Monocytes Absolute: 0.8 10*3/uL (ref 0.1–1.0)
Monocytes Relative: 9 %
Neutro Abs: 4.8 10*3/uL (ref 1.7–7.7)
Neutrophils Relative %: 55 %

## 2021-03-23 LAB — I-STAT CHEM 8, ED
BUN: 21 mg/dL (ref 8–23)
Calcium, Ion: 1.13 mmol/L — ABNORMAL LOW (ref 1.15–1.40)
Chloride: 103 mmol/L (ref 98–111)
Creatinine, Ser: 0.8 mg/dL (ref 0.44–1.00)
Glucose, Bld: 214 mg/dL — ABNORMAL HIGH (ref 70–99)
HCT: 46 % (ref 36.0–46.0)
Hemoglobin: 15.6 g/dL — ABNORMAL HIGH (ref 12.0–15.0)
Potassium: 3.8 mmol/L (ref 3.5–5.1)
Sodium: 137 mmol/L (ref 135–145)
TCO2: 27 mmol/L (ref 22–32)

## 2021-03-23 LAB — APTT: aPTT: 27 seconds (ref 24–36)

## 2021-03-23 LAB — CBG MONITORING, ED: Glucose-Capillary: 217 mg/dL — ABNORMAL HIGH (ref 70–99)

## 2021-03-23 LAB — ETHANOL: Alcohol, Ethyl (B): <10 mg/dL (ref <10)

## 2021-03-23 LAB — PROTIME-INR
INR: 1.1 (ref 0.8–1.2)
Prothrombin Time: 14.4 seconds (ref 11.4–15.2)

## 2021-03-23 MED ORDER — CLEVIDIPINE BUTYRATE 0.5 MG/ML IV EMUL
0.0000 mg/h | INTRAVENOUS | Status: DC
  Administered 2021-03-23: 9 mg/h via INTRAVENOUS
  Administered 2021-03-23: 5 mg/h via INTRAVENOUS
  Administered 2021-03-24: 21 mg/h via INTRAVENOUS
  Administered 2021-03-24: 9 mg/h via INTRAVENOUS
  Administered 2021-03-24: 20 mg/h via INTRAVENOUS
  Administered 2021-03-24: 16 mg/h via INTRAVENOUS
  Administered 2021-03-24: 20 mg/h via INTRAVENOUS
  Administered 2021-03-24: 21 mg/h via INTRAVENOUS
  Administered 2021-03-24: 8 mg/h via INTRAVENOUS
  Administered 2021-03-25: 14 mg/h via INTRAVENOUS
  Administered 2021-03-25: 15 mg/h via INTRAVENOUS
  Filled 2021-03-23: qty 50
  Filled 2021-03-23 (×3): qty 100
  Filled 2021-03-23: qty 200
  Filled 2021-03-23: qty 50
  Filled 2021-03-23 (×2): qty 100
  Filled 2021-03-23: qty 50
  Filled 2021-03-23: qty 100

## 2021-03-23 MED ORDER — ACETAMINOPHEN 160 MG/5ML PO SOLN
650.0000 mg | ORAL | Status: DC | PRN
  Administered 2021-03-24 – 2021-03-29 (×5): 650 mg
  Filled 2021-03-23 (×5): qty 20.3

## 2021-03-23 MED ORDER — ALBUTEROL SULFATE HFA 108 (90 BASE) MCG/ACT IN AERS
1.0000 | INHALATION_SPRAY | Freq: Four times a day (QID) | RESPIRATORY_TRACT | Status: DC | PRN
  Filled 2021-03-23: qty 6.7

## 2021-03-23 MED ORDER — LABETALOL HCL 5 MG/ML IV SOLN
INTRAVENOUS | Status: AC
  Administered 2021-03-23: 10 mg
  Filled 2021-03-23: qty 4

## 2021-03-23 MED ORDER — CARVEDILOL 12.5 MG PO TABS: 25.0000 mg | ORAL_TABLET | Freq: Two times a day (BID) | ORAL | Status: DC

## 2021-03-23 MED ORDER — PANTOPRAZOLE SODIUM 40 MG IV SOLR
40.0000 mg | Freq: Every day | INTRAVENOUS | Status: DC
  Administered 2021-03-23: 40 mg via INTRAVENOUS
  Filled 2021-03-23: qty 40

## 2021-03-23 MED ORDER — SODIUM CHLORIDE 0.9 % IV SOLN: INTRAVENOUS | Status: DC

## 2021-03-23 MED ORDER — DILTIAZEM HCL ER COATED BEADS 180 MG PO CP24
180.0000 mg | ORAL_CAPSULE | Freq: Every day | ORAL | Status: DC
  Filled 2021-03-23 (×2): qty 1

## 2021-03-23 MED ORDER — IOHEXOL 350 MG/ML SOLN
75.0000 mL | Freq: Once | INTRAVENOUS | Status: AC | PRN
  Administered 2021-03-23: 75 mL via INTRAVENOUS

## 2021-03-23 MED ORDER — STROKE: EARLY STAGES OF RECOVERY BOOK
Freq: Once | Status: AC
  Filled 2021-03-23: qty 1

## 2021-03-23 MED ORDER — MELATONIN 5 MG PO TABS: 5.0000 mg | ORAL_TABLET | Freq: Every day | ORAL | Status: DC

## 2021-03-23 MED ORDER — SENNOSIDES-DOCUSATE SODIUM 8.6-50 MG PO TABS: 1.0000 | ORAL_TABLET | Freq: Two times a day (BID) | ORAL | Status: DC

## 2021-03-23 MED ORDER — FUROSEMIDE 20 MG PO TABS: 40.0000 mg | ORAL_TABLET | Freq: Every day | ORAL | Status: DC | PRN

## 2021-03-23 MED ORDER — ACETAMINOPHEN 325 MG PO TABS: 650.0000 mg | ORAL_TABLET | ORAL | Status: DC | PRN

## 2021-03-23 MED ORDER — CLEVIDIPINE BUTYRATE 0.5 MG/ML IV EMUL
INTRAVENOUS | Status: AC
  Administered 2021-03-23: 1 mg/h via INTRAVENOUS
  Filled 2021-03-23: qty 50

## 2021-03-23 MED ORDER — ALTEPLASE (STROKE) FULL DOSE INFUSION
0.9000 mg/kg | Freq: Once | INTRAVENOUS | Status: AC
  Administered 2021-03-23: 83.2 mg via INTRAVENOUS
  Filled 2021-03-23: qty 100

## 2021-03-23 MED ORDER — SODIUM CHLORIDE 0.9 % IV SOLN
50.0000 mL | Freq: Once | INTRAVENOUS | Status: AC
  Administered 2021-03-23: 50 mL via INTRAVENOUS

## 2021-03-23 MED ORDER — ACETAMINOPHEN 650 MG RE SUPP: 650.0000 mg | RECTAL | Status: DC | PRN

## 2021-03-23 MED ORDER — CHLORHEXIDINE GLUCONATE CLOTH 2 % EX PADS
6.0000 | MEDICATED_PAD | Freq: Every day | CUTANEOUS | Status: DC
  Administered 2021-03-24 – 2021-04-01 (×7): 6 via TOPICAL

## 2021-03-23 MED ORDER — LABETALOL HCL 5 MG/ML IV SOLN
5.0000 mg | Freq: Once | INTRAVENOUS | Status: AC
  Administered 2021-03-23: 5 mg via INTRAVENOUS

## 2021-03-23 MED ORDER — ORAL CARE MOUTH RINSE
15.0000 mL | Freq: Two times a day (BID) | OROMUCOSAL | Status: DC
  Administered 2021-03-23 – 2021-04-01 (×18): 15 mL via OROMUCOSAL

## 2021-03-23 NOTE — ED Notes (Signed)
This RN Verified with MD via phone consent for TPA and IR clot retrieval.

## 2021-03-23 NOTE — ED Triage Notes (Signed)
Pt BIB [facility] as CODE Stroke. Coming from home, husband called in & reported she was no speaking. She was last seen by her husband at 58 this morning, was last text messaging him about the weather at 1630, & was then found by her husband at home at 1800 nonverbal & "acting unusual." EMS reports she is A-fib on their monitor with a rate of 80, was prescribed coumadin in the past but never took it. She is reported to be a "borderline hypochondriac & always worried about constant health complaints & takes all herbal supplements (per EMS). BP 180/109, 80 bpm, CBG 116.

## 2021-03-23 NOTE — Progress Notes (Signed)
PHARMACIST CODE STROKE RESPONSE  Notified to mix tPA at 1903 by Dr. Cheral Marker Delivered tPA to RN at 1906  tPA dose = 8.3mg  bolus over 1 minute followed by 74.9 mg for a total dose of 83.2 mg over 1 hour  Issues/delays encountered (if applicable): initial bolus started at 1916 however at [place], bolus paused due to BP. Delayed infusion start once BP under control with multiple labetalol pushes and Cleviprex infusion.  Joetta Manners, PharmD, [facility] Clinical Pharmacist  Please check AMION for all [place] phone numbers After 10:00 PM, call [place] [phone]

## 2021-03-23 NOTE — ED Provider Notes (Signed)
Patient was cleared to the bridge by another provider.  At this time patient is still in CT.  I have not yet had the opportunity to evaluate the patient.   Charlesetta Shanks, MD 03/23/21 [phone]

## 2021-03-23 NOTE — Code Documentation (Signed)
Stroke Response Nurse Documentation Code Stroke Documentation Danielle Payne  is a 75 y.o.  y.o. female  arriving to [place] ED via [facility] on 03/23/2021 with PMH of CAD, HTN, Asthma, A-fib, HF. Code stroke was activated by EMS. Patient from home where she was LKW at 35, per husband they were talking on phone at this time and her communication was normal. Husband later arrived home around [facility] and found patient non-verbal. Patient prescribed warfarin daily PTA, per husband she is non-compliant with medications and instead prefers to take supplements in place of these. Stroke team at the bedside on patient arrival, CBG 217, labs drawn and patient cleared for CT by Dr. Johnney Killian. Patient taken to CT with team. NIHSS 11, see documentation for details and code stroke times. Patient with disoriented, not following commands, left gaze preference , right hemianopia, right decreased sensation, Global aphasia , and right neglect on exam. The following imaging was completed:  CT, CTA head and neck. Patient is a candidate for tPA, pt hypertensive in CT requiring Cleviprex to lower BP. tPA started at 1916 once in range but paused at 1917 due to htn, re-started at 1923. Per protocol pt is q54m VS/mNIH for 2h, then q40m VS/mNIH for 6h, and q1h VS/mNIH for 16h. Bedside handoff with ED RN Jazmine.    Lenore Manner  Stroke Response RN [phone]

## 2021-03-23 NOTE — Consult Note (Addendum)
Neurology H and P  Referring Physician: Dr. Johnney Killian    Chief Complaint: Acute onset of dense receptive and expressive aphasia  HPI: Danielle Payne is an 75 y.o. female with a history of atral fibrillation (not compliant with anticoagulation), asthma, chronic combined systolic and diastolic HF, CAD, HTN, hypercholesterolemia and CAD s/p angioplasty and CABG who presents via EMS from home with acute onset of dense receptive and expressive aphasia. LKN was at 4:35 PM when she was speaking on the telephone with her sister. When husband got home at about 6 PM, he noted her to be altered, not responding to questions and not speaking. EMS was called and on arrival they noted same. BP per EMS was 183/109, CBG was 216. Although she could not speak or follow commands, when EMS raised her arms and pantomimed keeping them raised, she was able to keep hers raised without any drift.   On arrival to the ED, she continued to be densely aphasic. No facial droop or motor weakness was noted.   Has atrial fibrillation. Was prescribed Coumadin back in 2020 but she never used the medication; EMS found expired Coumadin at her home which apparently had never been used.   LSN: 4:30 PM tPA Given: Yes  Past Medical History:  Diagnosis Date   Allergy    Asthma    Atrial fibrillation (HCC)    Chronic combined systolic and diastolic heart failure ([place]) 05/14/2018   Coronary artery disease    Diverticulitis    Diverticulitis    Hypertension    Pure hypercholesterolemia 05/14/2018   Stented coronary artery     Past Surgical History:  Procedure Laterality Date   ABDOMINAL HYSTERECTOMY     CARDIAC CATHETERIZATION     CARDIOVERSION N/A 12/21/2014   Procedure: CARDIOVERSION;  Surgeon: Adrian Prows, MD;  Location: [facility] ENDOSCOPY;  Service: Cardiovascular;  Laterality: N/A;   CORONARY ANGIOPLASTY     CORONARY ARTERY BYPASS GRAFT      Family History  Problem Relation Age of Onset   CAD Mother    Stroke Mother     Hypertension Mother    Prostate cancer Father    Diabetes Mellitus II Sister    Diabetes Mellitus II Brother    CAD Maternal Aunt    Social History:  reports that she has never smoked. She has never used smokeless tobacco. She reports that she does not drink alcohol and does not use drugs.  Allergies: No Known Allergies  Home Medications:   No current facility-administered medications on file prior to encounter.   Current Outpatient Medications on File Prior to Encounter  Medication Sig Dispense Refill   albuterol (VENTOLIN HFA) 108 (90 Base) MCG/ACT inhaler INHALE TWO PUFFS INTO THE LUNGS EVERY 4 HOURS AS NEEDED FOR WHEEZING OR SHORTNESS OF BREATH 18 g 1   carvedilol (COREG) 25 MG tablet Take 1 tablet (25 mg total) by mouth 2 (two) times daily with a meal. 180 tablet 1   diltiazem (CARDIZEM CD) 180 MG 24 hr capsule Take 1 capsule (180 mg total) by mouth daily. 90 capsule 3   furosemide (LASIX) 40 MG tablet Take 40 mg by mouth daily as needed for fluid or edema.      loratadine (CLARITIN) 10 MG tablet Take 10 mg by mouth daily.     Melatonin 5 MG TBDP Take 5 mg by mouth at bedtime.      NON FORMULARY Natural nasal spray nedi pot     NON FORMULARY nattokinasase blood thinner  Probiotic Product (PROBIOTIC PO) Take by mouth.     Protease POWD by Does not apply route.     UBIQUINOL PO Take 100 mg by mouth. (Patient not taking: Reported on 05/09/2020)    She is also on several herbal medications.    ROS: Unable to obtain due to aphasia. Does not seem to be in any pain or discomfort.   Physical Examination: There were no vitals taken for this visit.  HEENT: [place]/AT Lungs: Respirations unlabored Ext: No edema  Neurologic Examination: Mental Status: Awake and alert. Dense receptive and expressive aphasia with no verbal output and not following any verbal commands. Non-agitated.  Cranial Nerves: II:  Blinks to threat in left hemifield. No blink to threat in right hemifield.  PERRL III,IV, VI: No ptosis. EOMI.  V,VII: Face is symmetric at rest. Does not smile to command. Blinks to eyelid stimulation on the left, but not consistently on the right.  VIII: Unable to assess.  IX,X: Does not open mouth to command XI: Head is midline. XII: Does not protrude. idline tongue extension without atrophy or fasciculations Motor: BUE remain elevated without drift > 10 seconds when passively raised and released.  BLE: withdraw equally briskly to noxious plantar stimulation Sensory: Did not react briskly to needle stick RUE, but did react briskly on the left.  Deep Tendon Reflexes:  Normoactive x 4 without asymmetry Plantars: Right: downgoing   Left: equivocal Cerebellar: Unable to assess as patient not following commands.  Gait: Unable to assess   Results for orders placed or performed during the hospital encounter of 03/23/21 (from the past 48 hour(s))  CBG monitoring, ED     Status: Abnormal   Collection Time: 03/23/21  6:47 PM  Result Value Ref Range   Glucose-Capillary 217 (H) 70 - 99 mg/dL    Comment: Glucose reference range applies only to samples taken after fasting for at least 8 hours.   No results found.  Assessment: 75 y.o. female presenting with acute onset of dense receptive and expressive aphasia 1. Exam reveals findings referable to the left perisylvian cortices + optic radiations or occipital lobe on the left.  2. CT head shows no acute abnormality.  3. CTA of head and neck shows no LVO 4. Stroke Risk Factors - atral fibrillation, non-compliance with anticoagulation, CHF, CAD, HTN and hypercholesterolemia  5. After comprehensive review of possible contraindications, she has no absolute contraindications to tPA administration. 6. Patient is a tPA candidate. Discussed extensively the risks/benefits of tPA treatment vs. no treatment with her husband over the telephone, including risks of hemorrhage and death with tPA administration versus worse overall  outcomes on average in patients within tPA time window who are not administered tPA. The patient's aphasia precludes meaningful medical decision making on her part at this time. Overall benefits of tPA regarding long-term prognosis are felt to outweigh risks. The patient's husband expressed understanding and wish to proceed with tPA. Consent witnessed by Stroke RN.  Plan: 1. Admitting to Neuro ICU.  2. Post-tPA order set to include frequent neuro checks and BP management.  3. No antiplatelet medications or anticoagulants for at least 24 hours following tPA.  4. DVT prophylaxis with SCDs.  5. Will need to be started on a statin.  6. Will need to start anticoagulation if follow up CT at 24 hours is negative for hemorrhagic conversion. 7. TTE.  9. MRI brain.  10. PT/OT/Speech.  11. NPO until passes swallow evaluation.  12. Cardiac telemetry.  13. Fasting lipid  panel, HgbA1c  60 minutes spent in the emergent neurological evaluation and management of this critically ill patient.   @Electronically  signed: Dr. Kerney Elbe 03/23/2021, 6:51 PM

## 2021-03-23 NOTE — ED Provider Notes (Signed)
[facility] EMERGENCY DEPARTMENT Provider Note   CSN: [id number] Arrival date & time: 03/23/21  1844     History Chief Complaint  Patient presents with   Code Stroke    Danielle Payne is a 75 y.o. female.  HPI Last known well with 4:35 PM.  Patient been talking on the phone with her sister.  Husband arrived home 6 PM and patient was not interacting normally or answering questions.  EMS identified hypertension at 183/109 and both expressive and receptive aphasia with patient not able to speak or follow commands.  Reportedly patient has history of atrial fibrillation but noncompliant with any anticoagulants.    Past Medical History:  Diagnosis Date   Allergy    Asthma    Atrial fibrillation (HCC)    Chronic combined systolic and diastolic heart failure ([place]) 05/14/2018   Coronary artery disease    Diverticulitis    Diverticulitis    Hypertension    Pure hypercholesterolemia 05/14/2018   Stented coronary artery     Patient Active Problem List   Diagnosis Date Noted   Class 2 severe obesity due to excess calories with serious comorbidity and body mass index (BMI) of 35.0 to 35.9 in adult [facility]) 12/05/2019   Diabetes mellitus with stage 2 chronic kidney disease ([place]) 12/05/2019   Type 2 diabetes mellitus without complications ([place]) 40/05/6760   Diverticulitis of colon with perforation 04/02/2019   Abdominal pain 03/10/2019   Cellulitis 03/10/2019   Dysphonia 03/10/2019   Chronic combined systolic and diastolic heart failure ([place]) 05/14/2018   Pure hypercholesterolemia 05/14/2018   Atrial fibrillation ([place]) 11/23/2014   Palpitations 11/23/2014   Essential hypertension 11/23/2014   Asthma, chronic 11/23/2014   CAD (coronary artery disease) 11/23/2014   H/o Lyme disease 03/22/2013    Past Surgical History:  Procedure Laterality Date   ABDOMINAL HYSTERECTOMY     CARDIAC CATHETERIZATION     CARDIOVERSION N/A 12/21/2014   Procedure: CARDIOVERSION;  Surgeon:  Adrian Prows, MD;  Location: [facility] ENDOSCOPY;  Service: Cardiovascular;  Laterality: N/A;   CORONARY ANGIOPLASTY     CORONARY ARTERY BYPASS GRAFT       OB History   No obstetric history on file.     Family History  Problem Relation Age of Onset   CAD Mother    Stroke Mother    Hypertension Mother    Prostate cancer Father    Diabetes Mellitus II Sister    Diabetes Mellitus II Brother    CAD Maternal Aunt     Social History   Tobacco Use   Smoking status: Never   Smokeless tobacco: Never  Vaping Use   Vaping Use: Never used  Substance Use Topics   Alcohol use: No   Drug use: No    Home Medications Prior to Admission medications   Medication Sig Start Date End Date Taking? Authorizing Provider  albuterol (VENTOLIN HFA) 108 (90 Base) MCG/ACT inhaler INHALE TWO PUFFS INTO THE LUNGS EVERY 4 HOURS AS NEEDED FOR WHEEZING OR SHORTNESS OF BREATH 02/02/20   Glendale Chard, MD  carvedilol (COREG) 25 MG tablet Take 1 tablet (25 mg total) by mouth 2 (two) times daily with a meal. 03/09/20   Skeet Latch, MD  diltiazem (CARDIZEM CD) 180 MG 24 hr capsule Take 1 capsule (180 mg total) by mouth daily. 03/31/20   Skeet Latch, MD  furosemide (LASIX) 40 MG tablet Take 40 mg by mouth daily as needed for fluid or edema.     [provider]  loratadine (CLARITIN) 10 MG tablet Take 10 mg by mouth daily.    [provider]  Melatonin 5 MG TBDP Take 5 mg by mouth at bedtime.     [provider]  NON FORMULARY Natural nasal spray nedi pot    [provider]  NON FORMULARY nattokinasase blood thinner    [provider]  Probiotic Product (PROBIOTIC PO) Take by mouth.    [provider]  Protease POWD by Does not apply route.    [provider]  UBIQUINOL PO Take 100 mg by mouth. Patient not taking: Reported on 05/09/2020    [provider]    Allergies    Patient has no known allergies.  Review of Systems   Review of  Systems Level 5 caveat cannot obtain review of systems due to patient condition. Physical Exam Updated Vital Signs BP (!) 159/90   Pulse 61   Resp 18   Wt 92.4 kg   SpO2 98%   BMI 36.08 kg/m   Physical Exam Constitutional:      Comments: No respiratory distress.  Airway protected.  Confused.  HENT:     Mouth/Throat:     Pharynx: Oropharynx is clear.  Eyes:     Extraocular Movements: Extraocular movements intact.     Comments: Not following commands for extraocular motions  Cardiovascular:     Comments: Irregularly irregular Pulmonary:     Effort: Pulmonary effort is normal.     Breath sounds: Normal breath sounds.  Abdominal:     General: There is no distension.     Palpations: Abdomen is soft.     Tenderness: There is no abdominal tenderness.  Skin:    General: Skin is warm and dry.  Neurological:     Comments: Cannot follow commands.  Alert appearance    ED Results / Procedures / Treatments   Labs (all labs ordered are listed, but only abnormal results are displayed) Labs Reviewed  CBC - Abnormal; Notable for the following components:      Result Value   Hemoglobin 15.1 (*)    All other components within normal limits  COMPREHENSIVE METABOLIC PANEL - Abnormal; Notable for the following components:   Glucose, Bld 217 (*)    All other components within normal limits  I-STAT CHEM 8, ED - Abnormal; Notable for the following components:   Glucose, Bld 214 (*)    Calcium, Ion 1.13 (*)    Hemoglobin 15.6 (*)    All other components within normal limits  CBG MONITORING, ED - Abnormal; Notable for the following components:   Glucose-Capillary 217 (*)    All other components within normal limits  RESP PANEL BY RT-PCR (FLU A&B, COVID) ARPGX2  ETHANOL  PROTIME-INR  APTT  DIFFERENTIAL  RAPID URINE DRUG SCREEN, HOSP PERFORMED  URINALYSIS, ROUTINE W REFLEX MICROSCOPIC    EKG EKG Interpretation  Date/Time:  Thursday March 23 2021 20:51:53 EDT Ventricular Rate:   89 PR Interval:    QRS Duration: 97 QT Interval:  377 QTC Calculation: 459 R Axis:   15 Text Interpretation: Atrial fibrillation Repol abnrm suggests ischemia, lateral leads Confirmed by Merrily Pew [phone]) on 03/28/2021 6:21:57 AM  Radiology CT HEAD CODE STROKE WO CONTRAST  Result Date: 03/23/2021 CLINICAL DATA:  Code stroke.  Acute neuro deficit.  Aphasia EXAM: CT HEAD WITHOUT CONTRAST TECHNIQUE: Contiguous axial images were obtained from the base of the skull through the vertex without intravenous contrast. COMPARISON:  None. FINDINGS: Brain: Mild  atrophy and mild white matter hypodensity bilaterally. Negative for acute infarct, hemorrhage, mass. Vascular: Negative for hyperdense vessel Skull: Negative Sinuses/Orbits: Mild mucosal edema paranasal sinuses. Negative orbit Other: None ASPECTS ([place] Stroke Program Early CT Score) - Ganglionic level infarction (caudate, lentiform nuclei, internal capsule, insula, M1-M3 cortex): 7 - Supraganglionic infarction (M4-M6 cortex): 3 Total score (0-10 with 10 being normal): 10 IMPRESSION: 1. No acute abnormality. 2. ASPECTS is 10 3. Code stroke imaging results were communicated on 03/23/2021 at 6:59 pm to provider [place] via text page Electronically Signed   By: Franchot Gallo M.D.   On: 03/23/2021 19:00   CT ANGIO HEAD CODE STROKE  Result Date: 03/23/2021 CLINICAL DATA:  Expressive aphasia EXAM: CT ANGIOGRAPHY HEAD AND NECK TECHNIQUE: Multidetector CT imaging of the head and neck was performed using the standard protocol during bolus administration of intravenous contrast. Multiplanar CT image reconstructions and MIPs were obtained to evaluate the vascular anatomy. Carotid stenosis measurements (when applicable) are obtained utilizing NASCET criteria, using the distal internal carotid diameter as the denominator. CONTRAST:  35mL OMNIPAQUE IOHEXOL 350 MG/ML SOLN COMPARISON:  None. FINDINGS: CTA NECK FINDINGS SKELETON: There is no bony spinal canal  stenosis. No lytic or blastic lesion. OTHER NECK: Normal pharynx, larynx and major salivary glands. No cervical lymphadenopathy. Unremarkable thyroid gland. UPPER CHEST: No pneumothorax or pleural effusion. No nodules or masses. AORTIC ARCH: There is calcific atherosclerosis of the aortic arch. There is no aneurysm, dissection or hemodynamically significant stenosis of the visualized portion of the aorta. Conventional 3 vessel aortic branching pattern. The visualized proximal subclavian arteries are widely patent. RIGHT CAROTID SYSTEM: No dissection, occlusion or aneurysm. Mild atherosclerotic calcification at the carotid bifurcation without hemodynamically significant stenosis. LEFT CAROTID SYSTEM: No dissection, occlusion or aneurysm. Mild atherosclerotic calcification at the carotid bifurcation without hemodynamically significant stenosis. VERTEBRAL ARTERIES: Left dominant configuration. Both origins are clearly patent. There is no dissection, occlusion or flow-limiting stenosis to the skull base (V1-V3 segments). CTA HEAD FINDINGS POSTERIOR CIRCULATION: --Vertebral arteries: Normal V4 segments. --Inferior cerebellar arteries: Normal. --Basilar artery: Normal. --Superior cerebellar arteries: Normal. --Posterior cerebral arteries (PCA): Normal. ANTERIOR CIRCULATION: --Intracranial internal carotid arteries: Normal. --Anterior cerebral arteries (ACA): Normal. Both A1 segments are present. Patent anterior communicating artery (a-comm). --Middle cerebral arteries (MCA): Normal. VENOUS SINUSES: As permitted by contrast timing, patent. ANATOMIC VARIANTS: None Review of the MIP images confirms the above findings. IMPRESSION: 1. No emergent large vessel occlusion or high-grade stenosis of the intracranial arteries. 2. Mild bilateral carotid bifurcation atherosclerosis without hemodynamically significant stenosis by NASCET criteria. Aortic Atherosclerosis (ICD10-I70.0). Electronically Signed   By: Ulyses Jarred M.D.   On:  03/23/2021 19:38   CT ANGIO NECK CODE STROKE  Result Date: 03/23/2021 : CLINICAL DATA:   Expressive aphasia EXAM: CT ANGIOGRAPHY HEAD AND NECK TECHNIQUE: Multidetector CT imaging of the head and neck was performed using the standard protocol during bolus administration of intravenous contrast. Multiplanar CT image reconstructions and MIPs were obtained to evaluate the vascular anatomy. Carotid stenosis measurements (when applicable) are obtained utilizing NASCET criteria, using the distal internal carotid diameter as the denominator. CONTRAST:   24mL OMNIPAQUE IOHEXOL 350 MG/ML SOLN COMPARISON:   None. FINDINGS: CTA NECK FINDINGS SKELETON: There is no bony spinal canal stenosis. No lytic or blastic lesion. OTHER NECK: Normal pharynx, larynx and major salivary glands. No cervical lymphadenopathy. Unremarkable thyroid gland. UPPER CHEST: No pneumothorax or pleural effusion. No nodules or masses. AORTIC ARCH: There is calcific atherosclerosis of the aortic arch. There is  no aneurysm, dissection or hemodynamically significant stenosis of the visualized portion of the aorta. Conventional 3 vessel aortic branching pattern. The visualized proximal subclavian arteries are widely patent. RIGHT CAROTID SYSTEM: No dissection, occlusion or aneurysm. Mild atherosclerotic calcification at the carotid bifurcation without hemodynamically significant stenosis. LEFT CAROTID SYSTEM: No dissection, occlusion or aneurysm. Mild atherosclerotic calcification at the carotid bifurcation without hemodynamically significant stenosis. VERTEBRAL ARTERIES: Left dominant configuration. Both origins are clearly patent. There is no dissection, occlusion or flow-limiting stenosis to the skull base (V1-V3 segments). CTA HEAD FINDINGS POSTERIOR CIRCULATION: --Vertebral arteries: Normal V4 segments. --Inferior cerebellar arteries: Normal. --Basilar artery: Normal. --Superior cerebellar arteries: Normal. --Posterior cerebral arteries (PCA): Normal.  ANTERIOR CIRCULATION: --Intracranial internal carotid arteries: Normal. --Anterior cerebral arteries (ACA): Normal. Both A1 segments are present. Patent anterior communicating artery (a-comm). --Middle cerebral arteries (MCA): Normal. VENOUS SINUSES: As permitted by contrast timing, patent. ANATOMIC VARIANTS: None Review of the MIP images confirms the above findings. IMPRESSION: 1. No emergent large vessel occlusion or high-grade stenosis of the intracranial arteries. 2. Mild bilateral carotid bifurcation atherosclerosis without hemodynamically significant stenosis by NASCET criteria. Aortic Atherosclerosis (ICD10-I70.0). Electronically Signed   By: Ulyses Jarred M.D.   On: 03/23/2021 19:45    Procedures Procedures   Medications Ordered in ED Medications  alteplase (ACTIVASE) 1 mg/mL infusion 83.2 mg ( Intravenous Restarted 03/23/21 1923)    Followed by  0.9 %  sodium chloride infusion (has no administration in time range)  clevidipine (CLEVIPREX) infusion 0.5 mg/mL (1 mg/hr Intravenous New Bag/Given 03/23/21 1912)  labetalol (NORMODYNE) 5 MG/ML injection (10 mg  Given 03/23/21 1904)  iohexol (OMNIPAQUE) 350 MG/ML injection 75 mL (75 mLs Intravenous Contrast Given 03/23/21 1926)  labetalol (NORMODYNE) injection 5 mg (5 mg Intravenous Given 03/23/21 1911)    ED Course  I have reviewed the triage vital signs and the nursing notes.  Pertinent labs & imaging results that were available during my care of the patient were reviewed by me and considered in my medical decision making (see chart for details).    MDM Rules/Calculators/A&P                          Patient presents with code stroke.  Neurology has determined to initiate tPA.  Patient will be admitted to neurology service. Final Clinical Impression(s) / ED Diagnoses Final diagnoses:  Cerebrovascular accident (CVA), unspecified mechanism ([place])    Rx / [place] Orders ED Discharge Orders     None        Charlesetta Shanks, MD 04/02/21  1207

## 2021-03-24 ENCOUNTER — Inpatient Hospital Stay (HOSPITAL_COMMUNITY): Payer: Medicare Other

## 2021-03-24 ENCOUNTER — Encounter (HOSPITAL_COMMUNITY): Payer: Self-pay | Admitting: Neurology

## 2021-03-24 DIAGNOSIS — I6389 Other cerebral infarction: Secondary | ICD-10-CM

## 2021-03-24 DIAGNOSIS — I63412 Cerebral infarction due to embolism of left middle cerebral artery: Secondary | ICD-10-CM | POA: Diagnosis not present

## 2021-03-24 DIAGNOSIS — I25119 Atherosclerotic heart disease of native coronary artery with unspecified angina pectoris: Secondary | ICD-10-CM | POA: Diagnosis not present

## 2021-03-24 DIAGNOSIS — I4821 Permanent atrial fibrillation: Secondary | ICD-10-CM | POA: Diagnosis not present

## 2021-03-24 LAB — URINALYSIS, ROUTINE W REFLEX MICROSCOPIC
Bacteria, UA: NONE SEEN
Bilirubin Urine: NEGATIVE
Glucose, UA: 150 mg/dL — AB
Ketones, ur: 5 mg/dL — AB
Leukocytes,Ua: NEGATIVE
Nitrite: NEGATIVE
Protein, ur: 30 mg/dL — AB
Specific Gravity, Urine: 1.02 (ref 1.005–1.030)
pH: 7 (ref 5.0–8.0)

## 2021-03-24 LAB — RAPID URINE DRUG SCREEN, HOSP PERFORMED
Amphetamines: NOT DETECTED
Barbiturates: NOT DETECTED
Benzodiazepines: NOT DETECTED
Cocaine: NOT DETECTED
Opiates: NOT DETECTED
Tetrahydrocannabinol: NOT DETECTED

## 2021-03-24 LAB — ECHOCARDIOGRAM COMPLETE
S' Lateral: 3.4 cm
Weight: 3259.28 oz

## 2021-03-24 LAB — LIPID PANEL
Cholesterol: 126 mg/dL (ref 0–200)
HDL: 27 mg/dL — ABNORMAL LOW (ref >40)
LDL Cholesterol: UNDETERMINED mg/dL (ref 0–99)
Triglycerides: 1219 mg/dL — ABNORMAL HIGH (ref <150)
VLDL: UNDETERMINED mg/dL (ref 0–40)

## 2021-03-24 LAB — LDL CHOLESTEROL, DIRECT: Direct LDL: 80.9 mg/dL (ref 0–99)

## 2021-03-24 LAB — MAGNESIUM: Magnesium: 1.8 mg/dL (ref 1.7–2.4)

## 2021-03-24 LAB — GLUCOSE, CAPILLARY
Glucose-Capillary: 376 mg/dL — ABNORMAL HIGH (ref 70–99)
Glucose-Capillary: 308 mg/dL — ABNORMAL HIGH (ref 70–99)
Glucose-Capillary: 309 mg/dL — ABNORMAL HIGH (ref 70–99)

## 2021-03-24 LAB — PHOSPHORUS: Phosphorus: 2.5 mg/dL (ref 2.5–4.6)

## 2021-03-24 LAB — MRSA PCR SCREENING: MRSA by PCR: NEGATIVE

## 2021-03-24 MED ORDER — JEVITY 1.2 CAL PO LIQD
1000.0000 mL | ORAL | Status: DC
  Administered 2021-03-24 – 2021-03-29 (×4): 1000 mL
  Filled 2021-03-24 (×9): qty 1000

## 2021-03-24 MED ORDER — LISINOPRIL 20 MG PO TABS
20.0000 mg | ORAL_TABLET | Freq: Every day | ORAL | Status: DC
  Administered 2021-03-24 – 2021-03-28 (×5): 20 mg
  Filled 2021-03-24 (×6): qty 1

## 2021-03-24 MED ORDER — DILTIAZEM 12 MG/ML ORAL SUSPENSION
60.0000 mg | Freq: Three times a day (TID) | ORAL | Status: DC
  Administered 2021-03-24 – 2021-03-26 (×6): 60 mg
  Filled 2021-03-24 (×7): qty 6

## 2021-03-24 MED ORDER — PANTOPRAZOLE SODIUM 40 MG PO PACK
40.0000 mg | PACK | Freq: Every day | ORAL | Status: DC
  Administered 2021-03-24 – 2021-03-29 (×6): 40 mg
  Filled 2021-03-24 (×6): qty 20

## 2021-03-24 MED ORDER — INSULIN ASPART 100 UNIT/ML IJ SOLN: 0.0000 [IU] | INTRAMUSCULAR | Status: DC

## 2021-03-24 MED ORDER — CARVEDILOL 12.5 MG PO TABS
25.0000 mg | ORAL_TABLET | Freq: Two times a day (BID) | ORAL | Status: DC
  Administered 2021-03-24 – 2021-03-31 (×14): 25 mg
  Filled 2021-03-24 (×15): qty 2

## 2021-03-24 MED ORDER — LISINOPRIL 20 MG PO TABS: 20.0000 mg | ORAL_TABLET | Freq: Every day | ORAL | Status: DC

## 2021-03-24 MED ORDER — DILTIAZEM HCL 60 MG PO TABS: 60.0000 mg | ORAL_TABLET | Freq: Three times a day (TID) | ORAL | Status: DC

## 2021-03-24 MED ORDER — INSULIN ASPART 100 UNIT/ML IJ SOLN: 0.0000 [IU] | Freq: Three times a day (TID) | INTRAMUSCULAR | Status: DC

## 2021-03-24 MED ORDER — SENNOSIDES-DOCUSATE SODIUM 8.6-50 MG PO TABS
1.0000 | ORAL_TABLET | Freq: Two times a day (BID) | ORAL | Status: DC
  Administered 2021-03-24 – 2021-03-30 (×10): 1
  Filled 2021-03-24 (×12): qty 1

## 2021-03-24 MED ORDER — INSULIN ASPART 100 UNIT/ML IJ SOLN
0.0000 [IU] | INTRAMUSCULAR | Status: DC
Start: 2021-03-24 — End: 2021-03-27
  Administered 2021-03-24 (×2): 7 [IU] via SUBCUTANEOUS
  Administered 2021-03-24: 9 [IU] via SUBCUTANEOUS
  Administered 2021-03-25: 5 [IU] via SUBCUTANEOUS
  Administered 2021-03-25: 3 [IU] via SUBCUTANEOUS
  Administered 2021-03-25: 5 [IU] via SUBCUTANEOUS
  Administered 2021-03-25: 7 [IU] via SUBCUTANEOUS
  Administered 2021-03-25 – 2021-03-26 (×3): 3 [IU] via SUBCUTANEOUS
  Administered 2021-03-26: 1 [IU] via SUBCUTANEOUS
  Administered 2021-03-26: 7 [IU] via SUBCUTANEOUS
  Administered 2021-03-26: 3 [IU] via SUBCUTANEOUS
  Administered 2021-03-26 (×2): 2 [IU] via SUBCUTANEOUS
  Administered 2021-03-27: 7 [IU] via SUBCUTANEOUS
  Administered 2021-03-27: 5 [IU] via SUBCUTANEOUS

## 2021-03-24 MED ORDER — MELATONIN 5 MG PO TABS
5.0000 mg | ORAL_TABLET | Freq: Every day | ORAL | Status: DC
  Administered 2021-03-24 – 2021-03-31 (×7): 5 mg
  Filled 2021-03-24 (×7): qty 1

## 2021-03-24 MED ORDER — PROSOURCE TF PO LIQD
45.0000 mL | Freq: Every day | ORAL | Status: DC
  Administered 2021-03-24 – 2021-03-31 (×8): 45 mL
  Filled 2021-03-24 (×8): qty 45

## 2021-03-24 MED ORDER — METOPROLOL TARTRATE 5 MG/5ML IV SOLN
5.0000 mg | Freq: Four times a day (QID) | INTRAVENOUS | Status: DC | PRN
  Administered 2021-03-24: 5 mg via INTRAVENOUS
  Administered 2021-03-24 (×2): 2.5 mg via INTRAVENOUS
  Filled 2021-03-24 (×3): qty 5

## 2021-03-24 MED ORDER — INSULIN ASPART 100 UNIT/ML IJ SOLN: 0.0000 [IU] | Freq: Every day | INTRAMUSCULAR | Status: DC

## 2021-03-24 MED ORDER — FUROSEMIDE 20 MG PO TABS: 40.0000 mg | ORAL_TABLET | Freq: Every day | ORAL | Status: DC | PRN

## 2021-03-24 MED ORDER — AMPICILLIN-SULBACTAM SODIUM 3 (2-1) G IJ SOLR
3.0000 g | Freq: Four times a day (QID) | INTRAMUSCULAR | Status: DC
Start: 2021-03-24 — End: 2021-03-27
  Administered 2021-03-24 – 2021-03-27 (×11): 3 g via INTRAVENOUS
  Filled 2021-03-24 (×2): qty 3
  Filled 2021-03-24: qty 8
  Filled 2021-03-24 (×4): qty 3
  Filled 2021-03-24 (×2): qty 8
  Filled 2021-03-24 (×4): qty 3

## 2021-03-24 NOTE — Progress Notes (Signed)
Pharmacy Antibiotic Note  Danielle Payne is a 75 y.o. female admitted on 03/23/2021 presenting as code stroke, now with episodes of choking and SOB with concern for aspiration.  Pharmacy has been consulted for Unasyn dosing.  Plan: Unasyn 3g IV every 6 hours Monitor renal function, clinical progression and LOT  Weight: 92.4 kg (203 lb 11.3 oz)  Temp (24hrs), Avg:98.5 F (36.9 C), Min:97.3 F (36.3 C), Max:99.4 F (37.4 C)  Recent Labs  Lab 03/23/21 1847 03/23/21 1858  WBC 8.8  --   CREATININE 0.90 0.80    CrCl cannot be calculated (Unknown ideal weight.).    No Known Allergies  Bertis Ruddy, PharmD Clinical Pharmacist ED Pharmacist Phone # [phone] 03/24/2021 4:11 PM

## 2021-03-24 NOTE — Evaluation (Signed)
Clinical/Bedside Swallow Evaluation Patient Details  Name: Danielle Payne MRN: [id number] Date of Birth: [birth year]  Today's Date: 03/24/2021 Time: SLP Start Time (ACUTE ONLY): 0848 SLP Stop Time (ACUTE ONLY): 0903 SLP Time Calculation (min) (ACUTE ONLY): 15 min  Past Medical History:  Past Medical History:  Diagnosis Date   Allergy    Asthma    Atrial fibrillation ([place])    Chronic combined systolic and diastolic heart failure ([place]) 05/14/2018   Coronary artery disease    Diverticulitis    Diverticulitis    Hypertension    Pure hypercholesterolemia 05/14/2018   Stented coronary artery    Past Surgical History:  Past Surgical History:  Procedure Laterality Date   ABDOMINAL HYSTERECTOMY     CARDIAC CATHETERIZATION     CARDIOVERSION N/A 12/21/2014   Procedure: CARDIOVERSION;  Surgeon: Adrian Prows, MD;  Location: [facility];  Service: Cardiovascular;  Laterality: N/A;   CORONARY ANGIOPLASTY     CORONARY ARTERY BYPASS GRAFT     HPI:  Danielle Payne is an 75 y.o. female with a history of atral fibrillation, asthma, chronic combined systolic and diastolic HF, CAD, HTN, hypercholesterolemia and CAD s/p angioplasty and CABG who presents with acute onset of dense receptive and expressive aphasia. CT negative. MRI pending   Assessment / Plan / Recommendation Clinical Impression  [name] assessed for swallowing in presence of global aphasia and motor apraxia. Command following for oral-motor was difficult in addition to poor ability to sustain attention and visually focus on objects even on unaffected side. Hand over right hand dominant support given for cup sips water include labial leakage and delayed throat clear after 2 oz consumed over trials. An MBS is needed to objectify and ensure sensory and motor components are efficient. MBS scheduled for 11:00 this morning. SLP Visit Diagnosis: Dysphagia, unspecified (R13.10)    Aspiration Risk  Moderate aspiration risk    Diet  Recommendation NPO        Other  Recommendations     Follow up Recommendations        Frequency and Duration            Prognosis Prognosis for Safe Diet Advancement: Good Barriers to Reach Goals: Language deficits;Cognitive deficits      Swallow Study   General HPI: Danielle Payne is an 75 y.o. female with a history of atral fibrillation, asthma, chronic combined systolic and diastolic HF, CAD, HTN, hypercholesterolemia and CAD s/p angioplasty and CABG who presents with acute onset of dense receptive and expressive aphasia. CT negative. MRI pending Type of Study: Bedside Swallow Evaluation Previous Swallow Assessment:  (none) Diet Prior to this Study: NPO Temperature Spikes Noted: No Respiratory Status: Room air History of Recent Intubation: No Behavior/Cognition: Alert;Cooperative;Pleasant mood;Confused;Distractible;Requires cueing Oral Cavity Assessment:  (odorous) Oral Care Completed by SLP: Yes Oral Cavity - Dentition: Adequate natural dentition Vision: Impaired for self-feeding Self-Feeding Abilities: Needs assist Patient Positioning: Upright in bed Baseline Vocal Quality: Low vocal intensity Volitional Cough:  (unable due to apraxia) Volitional Swallow: Unable to elicit    Oral/Motor/Sensory Function     Ice Chips Ice chips: Not tested   Thin Liquid Thin Liquid: Impaired Presentation: Cup;Spoon Oral Phase Impairments: Reduced labial seal Oral Phase Functional Implications: Right anterior spillage Pharyngeal  Phase Impairments: Throat Clearing - Delayed    Nectar Thick Nectar Thick Liquid: Not tested   Honey Thick Honey Thick Liquid: Not tested   Puree Puree: Within functional limits   Solid     Solid: Not tested  [name] 03/24/2021,9:43 AM  [name] Caroli.Ed Risk analyst [phone] Office [phone]

## 2021-03-24 NOTE — Progress Notes (Signed)
STROKE TEAM PROGRESS NOTE   SUBJECTIVE (INTERVAL HISTORY) Her daughter and son are at the bedside.  Overall her condition is unchanged. Pt still has global aphasia, mild right facial droop and right arm weakness. Still has afib RVR with HR 140s. Did not pass swallow initially planning for MBS, however, pt developed chocking episode with her own saliva, difficulty breathing with HR down to 70s from 140s, lasting 2 min and gradually resolved. O2 sat stable, now HR back up to 130-140s. Will proceed with cortrak and start po meds once cortrak placed.   Talked with husband over the phone and reviewed cardiology chart, pt off coumadin, and unwilling to take DOACs in the past. On home coreg and cardizem. Now on metoplolol 5mg  IV Q6 PRN, will resume home meds once cortrak placed.    OBJECTIVE Temp:  [97.3 F (36.3 C)-99.4 F (37.4 C)] 99.4 F (37.4 C) (06/17 0800) Pulse Rate:  [47-167] 131 (06/17 0930) Cardiac Rhythm: Atrial fibrillation (06/17 0800) Resp:  [8-28] 19 (06/17 0930) BP: (125-206)/(52-187) 158/126 (06/17 0930) SpO2:  [82 %-99 %] 93 % (06/17 0930) Weight:  [92.4 kg] 92.4 kg (06/16 1800)  Recent Labs  Lab 03/23/21 1847  GLUCAP 217*   Recent Labs  Lab 03/23/21 1847 03/23/21 1858  NA 135 137  K 3.7 3.8  CL 101 103  CO2 23  --   GLUCOSE 217* 214*  BUN 16 21  CREATININE 0.90 0.80  CALCIUM 9.2  --    Recent Labs  Lab 03/23/21 1847  AST 24  ALT 23  ALKPHOS 88  BILITOT 0.6  PROT 7.4  ALBUMIN 3.8   Recent Labs  Lab 03/23/21 1847 03/23/21 1858  WBC 8.8  --   NEUTROABS 4.8  --   HGB 15.1* 15.6*  HCT 46.0 46.0  MCV 92.0  --   PLT 233  --    No results for input(s): CKTOTAL, CKMB, CKMBINDEX, TROPONINI in the last 168 hours. Recent Labs    03/23/21 1847  LABPROT 14.4  INR 1.1   Recent Labs    03/24/21 0126  COLORURINE YELLOW  LABSPEC 1.020  PHURINE 7.0  GLUCOSEU 150*  HGBUR SMALL*  BILIRUBINUR NEGATIVE  KETONESUR 5*  PROTEINUR 30*  NITRITE NEGATIVE   LEUKOCYTESUR NEGATIVE       Component Value Date/Time   CHOL 126 03/24/2021 0510   CHOL 191 11/05/2019 0902   TRIG 1,219 (H) 03/24/2021 0510   HDL 27 (L) 03/24/2021 0510   HDL 39 (L) 11/05/2019 0902   CHOLHDL NOT REPORTED DUE TO HIGH TRIGLYCERIDES 03/24/2021 0510   VLDL UNABLE TO CALCULATE IF TRIGLYCERIDE OVER 400 mg/dL 03/24/2021 0510   LDLCALC UNABLE TO CALCULATE IF TRIGLYCERIDE OVER 400 mg/dL 03/24/2021 0510   LDLCALC 119 (H) 11/05/2019 0902   Lab Results  Component Value Date   HGBA1C 9.1 03/04/2020      Component Value Date/Time   LABOPIA NONE DETECTED 03/24/2021 0126   COCAINSCRNUR NONE DETECTED 03/24/2021 0126   LABBENZ NONE DETECTED 03/24/2021 0126   AMPHETMU NONE DETECTED 03/24/2021 0126   THCU NONE DETECTED 03/24/2021 0126   LABBARB NONE DETECTED 03/24/2021 0126    Recent Labs  Lab 03/23/21 1847  ETH <10    I have personally reviewed the radiological images below and agree with the radiology interpretations.  CT HEAD CODE STROKE WO CONTRAST  Result Date: 03/23/2021 CLINICAL DATA:  Code stroke.  Acute neuro deficit.  Aphasia EXAM: CT HEAD WITHOUT CONTRAST TECHNIQUE: Contiguous axial images were obtained  from the base of the skull through the vertex without intravenous contrast. COMPARISON:  None. FINDINGS: Brain: Mild atrophy and mild white matter hypodensity bilaterally. Negative for acute infarct, hemorrhage, mass. Vascular: Negative for hyperdense vessel Skull: Negative Sinuses/Orbits: Mild mucosal edema paranasal sinuses. Negative orbit Other: None ASPECTS ([place] Stroke Program Early CT Score) - Ganglionic level infarction (caudate, lentiform nuclei, internal capsule, insula, M1-M3 cortex): 7 - Supraganglionic infarction (M4-M6 cortex): 3 Total score (0-10 with 10 being normal): 10 IMPRESSION: 1. No acute abnormality. 2. ASPECTS is 10 3. Code stroke imaging results were communicated on 03/23/2021 at 6:59 pm to provider [place] via text page Electronically Signed    By: Franchot Gallo M.D.   On: 03/23/2021 19:00   CT ANGIO HEAD CODE STROKE  Result Date: 03/23/2021 CLINICAL DATA:  Expressive aphasia EXAM: CT ANGIOGRAPHY HEAD AND NECK TECHNIQUE: Multidetector CT imaging of the head and neck was performed using the standard protocol during bolus administration of intravenous contrast. Multiplanar CT image reconstructions and MIPs were obtained to evaluate the vascular anatomy. Carotid stenosis measurements (when applicable) are obtained utilizing NASCET criteria, using the distal internal carotid diameter as the denominator. CONTRAST:  57mL OMNIPAQUE IOHEXOL 350 MG/ML SOLN COMPARISON:  None. FINDINGS: CTA NECK FINDINGS SKELETON: There is no bony spinal canal stenosis. No lytic or blastic lesion. OTHER NECK: Normal pharynx, larynx and major salivary glands. No cervical lymphadenopathy. Unremarkable thyroid gland. UPPER CHEST: No pneumothorax or pleural effusion. No nodules or masses. AORTIC ARCH: There is calcific atherosclerosis of the aortic arch. There is no aneurysm, dissection or hemodynamically significant stenosis of the visualized portion of the aorta. Conventional 3 vessel aortic branching pattern. The visualized proximal subclavian arteries are widely patent. RIGHT CAROTID SYSTEM: No dissection, occlusion or aneurysm. Mild atherosclerotic calcification at the carotid bifurcation without hemodynamically significant stenosis. LEFT CAROTID SYSTEM: No dissection, occlusion or aneurysm. Mild atherosclerotic calcification at the carotid bifurcation without hemodynamically significant stenosis. VERTEBRAL ARTERIES: Left dominant configuration. Both origins are clearly patent. There is no dissection, occlusion or flow-limiting stenosis to the skull base (V1-V3 segments). CTA HEAD FINDINGS POSTERIOR CIRCULATION: --Vertebral arteries: Normal V4 segments. --Inferior cerebellar arteries: Normal. --Basilar artery: Normal. --Superior cerebellar arteries: Normal. --Posterior cerebral  arteries (PCA): Normal. ANTERIOR CIRCULATION: --Intracranial internal carotid arteries: Normal. --Anterior cerebral arteries (ACA): Normal. Both A1 segments are present. Patent anterior communicating artery (a-comm). --Middle cerebral arteries (MCA): Normal. VENOUS SINUSES: As permitted by contrast timing, patent. ANATOMIC VARIANTS: None Review of the MIP images confirms the above findings. IMPRESSION: 1. No emergent large vessel occlusion or high-grade stenosis of the intracranial arteries. 2. Mild bilateral carotid bifurcation atherosclerosis without hemodynamically significant stenosis by NASCET criteria. Aortic Atherosclerosis (ICD10-I70.0). Electronically Signed   By: Ulyses Jarred M.D.   On: 03/23/2021 19:38   CT ANGIO NECK CODE STROKE  Result Date: 03/23/2021 : CLINICAL DATA:   Expressive aphasia EXAM: CT ANGIOGRAPHY HEAD AND NECK TECHNIQUE: Multidetector CT imaging of the head and neck was performed using the standard protocol during bolus administration of intravenous contrast. Multiplanar CT image reconstructions and MIPs were obtained to evaluate the vascular anatomy. Carotid stenosis measurements (when applicable) are obtained utilizing NASCET criteria, using the distal internal carotid diameter as the denominator. CONTRAST:   60mL OMNIPAQUE IOHEXOL 350 MG/ML SOLN COMPARISON:   None. FINDINGS: CTA NECK FINDINGS SKELETON: There is no bony spinal canal stenosis. No lytic or blastic lesion. OTHER NECK: Normal pharynx, larynx and major salivary glands. No cervical lymphadenopathy. Unremarkable thyroid gland. UPPER CHEST: No pneumothorax or  pleural effusion. No nodules or masses. AORTIC ARCH: There is calcific atherosclerosis of the aortic arch. There is no aneurysm, dissection or hemodynamically significant stenosis of the visualized portion of the aorta. Conventional 3 vessel aortic branching pattern. The visualized proximal subclavian arteries are widely patent. RIGHT CAROTID SYSTEM: No dissection,  occlusion or aneurysm. Mild atherosclerotic calcification at the carotid bifurcation without hemodynamically significant stenosis. LEFT CAROTID SYSTEM: No dissection, occlusion or aneurysm. Mild atherosclerotic calcification at the carotid bifurcation without hemodynamically significant stenosis. VERTEBRAL ARTERIES: Left dominant configuration. Both origins are clearly patent. There is no dissection, occlusion or flow-limiting stenosis to the skull base (V1-V3 segments). CTA HEAD FINDINGS POSTERIOR CIRCULATION: --Vertebral arteries: Normal V4 segments. --Inferior cerebellar arteries: Normal. --Basilar artery: Normal. --Superior cerebellar arteries: Normal. --Posterior cerebral arteries (PCA): Normal. ANTERIOR CIRCULATION: --Intracranial internal carotid arteries: Normal. --Anterior cerebral arteries (ACA): Normal. Both A1 segments are present. Patent anterior communicating artery (a-comm). --Middle cerebral arteries (MCA): Normal. VENOUS SINUSES: As permitted by contrast timing, patent. ANATOMIC VARIANTS: None Review of the MIP images confirms the above findings. IMPRESSION: 1. No emergent large vessel occlusion or high-grade stenosis of the intracranial arteries. 2. Mild bilateral carotid bifurcation atherosclerosis without hemodynamically significant stenosis by NASCET criteria. Aortic Atherosclerosis (ICD10-I70.0). Electronically Signed   By: Ulyses Jarred M.D.   On: 03/23/2021 19:45      PHYSICAL EXAM  Temp:  [97.3 F (36.3 C)-99.4 F (37.4 C)] 99.4 F (37.4 C) (06/17 0800) Pulse Rate:  [47-167] 131 (06/17 0930) Resp:  [8-28] 19 (06/17 0930) BP: (125-206)/(52-187) 158/126 (06/17 0930) SpO2:  [82 %-99 %] 93 % (06/17 0930) Weight:  [92.4 kg] 92.4 kg (06/16 1800)  General - Well nourished, well developed, in no apparent distress after a 2-mim chocking episode.  Ophthalmologic - fundi not visualized due to noncooperation.  Cardiovascular - irregularly irregular heart rate and rhythm with afib RPR  on tele  Neuro - awake, alert, eyes open, global aphasia but able to pantomime. Able to say "OK" and "yes" only to every questions. Not follow simple commands except mouth open, but able to pantomime. Left gaze preference, barely cross midline, tracking on the left, inconsistently blinking to visual threat on the left but not blinking to the right. Right mild facial droop. Tongue protrusion not cooperative. RUE mild drift and LUE no drift, no drift. Bilaterally LEs withdraw to pain equally symmetrically. Sensation and coordination not cooperative, gait not tested.     ASSESSMENT/PLAN Ms. Danielle Payne is a 75 y.o. female with history of A. fib off AC, CHF, CAD, hypertension, hyperlipidemia, CAD status post CABG admitted for global aphasia.  tPA given.  Stroke:  left MCA infarct status post tPA, embolic, secondary to A. fib not on AC Resultant global aphasia with right hemiparesis CT no acute abnormality CTA head and neck no LVO MRI pending 2D Echo EF 60 to 65% LDL 80.9 HgbA1c pending SCDs for VTE prophylaxis  No antithrombotic prior to admission, now on No antithrombotic within 24 hours of tPA Ongoing aggressive stroke risk factor management Therapy recommendations: Pending Disposition: Pending  Chronic A. fib with RVR Follows with Dr. Oval Linsey on 01/28/20 Home meds including Coreg and Cardizem Metoprolol IV as needed Will restart home meds once po access Cardiology consulted for assistance Was on Coumadin before but taken off by patient self "concern that it was stiffening her arteries" "unwilling to take Eliquis or Xarelto because she does not know "what is in them."   taking a natural blood thinner sold over-the-counter upon the  recommendation of her herbalist. Discussed with daughter at bedside, she agreed with DOAC once appropriate  Hypertension Unstable, high On Cleviprex Resume Coreg and Cardizem once p.o. access Long term BP goal normotensive  Hyperlipidemia Home  meds: None  LDL 80.9, goal < 70 Consider statin once p.o. access Continue statin at discharge  Dysphagia Did not pass swallow Pending MBS Speech following 1 choking event this morning Will put cortrak and start tube feeding  Other Stroke Risk Factors Advanced age Obesity, Body mass index is 36.08 kg/m.  Coronary artery disease status post CABG CHF  Other Active Problems TG 1219, likely contamination from [facility] day # 1  This patient is critically ill due to stroke status post tPA, chronic A. fib with RVR, dysphagia, hypertensive emergency, CHF and at significant risk of neurological worsening, death form recurrent stroke, hemorrhagic conversion, bleeding from tPA, heart failure, aspiration pneumonia, sepsis, seizure. This patient's care requires constant monitoring of vital signs, hemodynamics, respiratory and cardiac monitoring, review of multiple databases, neurological assessment, discussion with family, other specialists and medical decision making of high complexity. I spent 50 minutes of neurocritical care time in the care of this patient. I had long discussion with daughter and son at bedside, updated pt current condition, treatment plan and potential prognosis, and answered all the questions.  They expressed understanding and appreciation.    Rosalin Hawking, MD PhD Stroke Neurology 03/24/2021 11:14 AM    To contact Stroke Continuity provider, please refer to [email or web address] After hours, contact General Neurology

## 2021-03-24 NOTE — Progress Notes (Signed)
  Echocardiogram 2D Echocardiogram has been performed.  Danielle Payne 03/24/2021, 3:54 PM

## 2021-03-24 NOTE — Evaluation (Signed)
Speech Language Pathology Evaluation Patient Details Name: Danielle Payne MRN: [id number] DOB: 1945-11-10 Today's Date: 03/24/2021 Time: 2993-7169 SLP Time Calculation (min) (ACUTE ONLY): 15 min  Problem List:  Patient Active Problem List   Diagnosis Date Noted   Stroke (cerebrum) ([place]) 03/23/2021   Class 2 severe obesity due to excess calories with serious comorbidity and body mass index (BMI) of 35.0 to 35.9 in adult [facility]) 12/05/2019   Diabetes mellitus with stage 2 chronic kidney disease ([place]) 12/05/2019   Type 2 diabetes mellitus without complications ([place]) 67/89/3810   Diverticulitis of colon with perforation 04/02/2019   Abdominal pain 03/10/2019   Cellulitis 03/10/2019   Dysphonia 03/10/2019   Chronic combined systolic and diastolic heart failure ([place]) 05/14/2018   Pure hypercholesterolemia 05/14/2018   Atrial fibrillation ([place]) 11/23/2014   Palpitations 11/23/2014   Essential hypertension 11/23/2014   Asthma, chronic 11/23/2014   CAD (coronary artery disease) 11/23/2014   H/o Lyme disease 03/22/2013   Past Medical History:  Past Medical History:  Diagnosis Date   Allergy    Asthma    Atrial fibrillation ([place])    Chronic combined systolic and diastolic heart failure ([place]) 05/14/2018   Coronary artery disease    Diverticulitis    Diverticulitis    Hypertension    Pure hypercholesterolemia 05/14/2018   Stented coronary artery    Past Surgical History:  Past Surgical History:  Procedure Laterality Date   ABDOMINAL HYSTERECTOMY     CARDIAC CATHETERIZATION     CARDIOVERSION N/A 12/21/2014   Procedure: CARDIOVERSION;  Surgeon: Adrian Prows, MD;  Location: [facility] ENDOSCOPY;  Service: Cardiovascular;  Laterality: N/A;   CORONARY ANGIOPLASTY     CORONARY ARTERY BYPASS GRAFT     HPI:  Danielle Payne is an 75 y.o. female with a history of atral fibrillation, asthma, chronic combined systolic and diastolic HF, CAD, HTN, hypercholesterolemia and CAD s/p angioplasty and CABG who  presents with acute onset of dense receptive and expressive aphasia. CT negative. MRI pending   Assessment / Plan / Recommendation Clinical Impression  Global aphasia present and indications of cognitive impairments with husband at bedside. Right handed pt could bring her head across midline to right on request and attended to left side primarily. Eyes moving in all quadrants and difficulty focusing on objects. Expression consisted of perseveration on "okay" and did attempt a undecipherable phrase on one occasion. She followed one command ("look at me") and with visual and tactile assist was difficult to implement motor commands (vision impacted likely). All yes/no responses to biographical information were inaccurate. Repetative counting with visual cues resulted in vocalizations versus approximations of numbers. Initiated education re: aphasia, what pt appears to be capable of at present and strategies to use with spouse and education will continue. Will also provide handout.    SLP Assessment  SLP Recommendation/Assessment: Patient needs continued Speech Lanaguage Pathology Services SLP Visit Diagnosis: Aphasia (R47.01);Cognitive communication deficit (R41.841)    Follow Up Recommendations  Inpatient Rehab    Frequency and Duration min 2x/week  2 weeks      SLP Evaluation Cognition  Overall Cognitive Status: Impaired/Different from baseline Arousal/Alertness:  (awake) Orientation Level:  (inaccurate with y/n re: orientation) Attention: Sustained Sustained Attention: Impaired Sustained Attention Impairment: Functional basic;Verbal basic Memory:  (diagnostic tx) Awareness: Impaired Awareness Impairment: Emergent impairment Problem Solving: Impaired Problem Solving Impairment: Functional complex Behaviors: Restless;Perseveration Safety/Judgment: Impaired       Comprehension  Auditory Comprehension Overall Auditory Comprehension: Impaired Yes/No Questions: Impaired Basic  Biographical Questions: 0-25%  accurate (0%) Commands: Impaired One Step Basic Commands: 0-24% accurate (10% "look at me") Interfering Components: Attention;Processing speed Visual Recognition/Discrimination Discrimination: Not tested Reading Comprehension Reading Status: Not tested    Expression Expression Primary Mode of Expression: Verbal Verbal Expression Overall Verbal Expression: Impaired Initiation: Impaired Automatic Speech:  (vocalization for counting) Level of Generative/Spontaneous Verbalization: Word Repetition: Impaired Level of Impairment: Word level Naming: Impairment Confrontation: Impaired (0%) Verbal Errors: Perseveration Pragmatics: Impairment Impairments: Abnormal affect;Eye contact Interfering Components: Attention Written Expression Dominant Hand: Right Written Expression:  (TBA)   Oral / Motor  Motor Speech Overall Motor Speech: Impaired Respiration: Within functional limits Phonation: Low vocal intensity Intelligibility: Intelligible Motor Planning: Impaired Level of Impairment: Word Motor Speech Errors: Unaware   GO                    [name] 03/24/2021, 10:10 AM [name] Caroli.Ed Risk analyst [phone] Office [phone]

## 2021-03-24 NOTE — Plan of Care (Addendum)
Pt was mildly restless throughout the whole day, no significant neuro change. Deemed not able to tolerate MRI therefore CT head around 24h post tPA was done. However, CT showed left MCA infarcts but also there is hemorrhagic transformation with petechial hemorrhage as well as small hematoma. Given already 24h post tPA, no reversal needed. No antithrombotic will be initiated at this time. BP goal < 140 with cleviprex PRN and po BP meds. Will repeat CT or MRI in am to document stability. Close neuro check, remain ICU care.   Rosalin Hawking, MD PhD Stroke Neurology 03/24/2021 7:01 PM

## 2021-03-24 NOTE — Progress Notes (Signed)
OT Cancellation Note  Patient Details Name: Danielle Payne MRN: [id number] DOB: 1946-09-06   Cancelled Treatment:    Reason Eval/Treat Not Completed: Active bedrest order  [place], OT/L   Acute OT Clinical Specialist Acute Rehabilitation Services Pager [phone] Office [phone]  03/24/2021, 8:15 AM

## 2021-03-24 NOTE — Consult Note (Signed)
Cardiology Consultation:   Patient ID: Danielle Payne MRN: [id number]; DOB: 02-09-1946  Admit date: 03/23/2021 Date of Consult: 03/24/2021  PCP:  Glendale Chard, MD   [facility] Providers Cardiologist:  Skeet Latch, MD 01/28/2020  Patient Profile:   Danielle Payne is a 75 y.o. female with a hx of CAD s/p CABG 2017(LIMA-->LAD, SVG-->OM, SVG-->PDA), chronic systolic and diastolic heart failure (systolic dysfunction resolved), hypertension, persistent atrial fibrillation, diabetes, and asthma, who is being seen 03/24/2021 for the evaluation of atrial fib at the request of Dr Theda Sers.  She was initially seen 05/2018 to establish care.  She was first diagnosed with atrial fibrillation in 2010 when she presented with afib with RVR.  She had an NSTEMI and had a Promus DES to the LCx.  She had a recurrent episode of symptomatic atrial fibrillation 03/2011.  She also required DCCV with Dr. Nadyne Coombes 12/2014.  She presented with recurrent NSTEMI  09/2016 and underwent 3 vessel CABG 09/25/16.  That surgery was complicated by recurrent pleural effusions that required thoracentesis.  Echo at that time revealed LVEF 35-40%.  Since that time she has felt well.  She has occasional heart fluttering that is not sustained.  When she moved back from [place] she decided to re-establish with [facility].  Danielle Payne had an echo 05/2018 that revealed an improvement in her LVEF 65 to 70%.    Danielle Payne is not willing to take statins because she is afraid of potential harm.  She was admitted 11/2018 with influenza.  This was complicated by atrial fibrillation with rapid ventricular response.  Her heart rate improved with control of her respiratory symptoms.  She spoke with our pharmacist and it was recommended that she be seen by [facility] but has not pursued this recommendation.    She is using a natural supplement for cholesterol (dandilion and milk thistle).   She was seen Eldridge Abrahams but when she  decided not to take Metformin because other family members had not tolerated that she decided not to follow back up with Ms. Ronnald Ramp.  At her last appointment she requested a prescription for Jardiance, which was supplied.  However she was unable to afford it and is only taking a natural supplement for her diabetes.  She is scheduled to see Dr. Baird Cancer next month.  She has not had any episodes of palpitations and wonders if she is taking too much medication for her atrial fibrillation.    She struggles with seasonal allergies but has no exertional chest pain.  She exercises on a rebounder machine for 10 minutes daily.  She feels better after exercise.  She denies any lower extremity edema, orthopnea, or PND.  At home her blood pressure is sometimes elevated to the 130s or 140s.  She eats mostly at home and does try to limit her sodium intake.    She sees a Theatre stage manager doctor who recommended that Coumadin be discontinued because it "stiffens her arteries."  She is taking a natural blood thinner instead.    Per Dr. Blenda Mounts last note: Ms Payne is unwilling to take Eliquis or Xarelto because she does not know "what is in them."  She is taking a natural blood thinner sold over-the-counter upon the recommendation of her herbalist.  I am unable to recommend that she continue with this strategy.  I strongly urged her to consider prescribed anticoagulant.   History of Present Illness:   Danielle Payne was admitted 06/16 as a Code Stroke. Sx include aphasia, R  facial droop and R arm weakness. Episode of choking associated w/ hypoxia and bradycardia seen (speech recommends NPO). MRI pending, no sig blockage by CTA brain/neck.   Pt in Afib RVR and cards asked to see.   Danielle Payne has been in atrial fibrillation for several years.  The plan has been for rate control and anticoagulation, not rhythm control.  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  She is currently sedated.  She arouses to touch, but is not able  to respond to questions.  No family is present.    Information was obtained from chart notes and from staff.   Past Medical History:  Diagnosis Date   Allergy    Asthma    Atrial fibrillation (HCC)    Chronic combined systolic and diastolic heart failure ([place]) 05/14/2018   Coronary artery disease    Diverticulitis    Diverticulitis    Hypertension    Pure hypercholesterolemia 05/14/2018   Stented coronary artery     Past Surgical History:  Procedure Laterality Date   ABDOMINAL HYSTERECTOMY     CARDIAC CATHETERIZATION     CARDIOVERSION N/A 12/21/2014   Procedure: CARDIOVERSION;  Surgeon: Adrian Prows, MD;  Location: [place];  Service: Cardiovascular;  Laterality: N/A;   CORONARY ANGIOPLASTY     CORONARY ARTERY BYPASS GRAFT       Home Medications:  Prior to Admission medications   Medication Sig Start Date End Date Taking? Authorizing Provider  albuterol (VENTOLIN HFA) 108 (90 Base) MCG/ACT inhaler INHALE TWO PUFFS INTO THE LUNGS EVERY 4 HOURS AS NEEDED FOR WHEEZING OR SHORTNESS OF BREATH 02/02/20   Glendale Chard, MD  carvedilol (COREG) 25 MG tablet Take 1 tablet (25 mg total) by mouth 2 (two) times daily with a meal. 03/09/20   Skeet Latch, MD  diltiazem (CARDIZEM CD) 180 MG 24 hr capsule Take 1 capsule (180 mg total) by mouth daily. 03/31/20   Skeet Latch, MD  furosemide (LASIX) 40 MG tablet Take 40 mg by mouth daily as needed for fluid or edema.     [provider]  loratadine (CLARITIN) 10 MG tablet Take 10 mg by mouth daily.    [provider]  Melatonin 5 MG TBDP Take 5 mg by mouth at bedtime.     [provider]  NON FORMULARY Natural nasal spray nedi pot    [provider]  NON FORMULARY nattokinasase blood thinner    [provider]  Probiotic Product (PROBIOTIC PO) Take by mouth.    [provider]  Protease POWD by Does not apply route.    [provider]    Inpatient Medications: Scheduled  Meds:  carvedilol  25 mg Per Tube BID WC   Chlorhexidine Gluconate Cloth  6 each Topical Daily   diltiazem  60 mg Per Tube Q8H   feeding supplement (PROSource TF)  45 mL Per Tube Daily   insulin aspart  0-9 Units Subcutaneous Q4H   mouth rinse  15 mL Mouth Rinse BID   melatonin  5 mg Per Tube QHS   pantoprazole sodium  40 mg Per Tube Daily   senna-docusate  1 tablet Per Tube BID   Continuous Infusions:  ampicillin-sulbactam (UNASYN) IV     clevidipine 8 mg/hr (03/24/21 1500)   feeding supplement (JEVITY 1.2 CAL) 1,000 mL (03/24/21 1424)   PRN Meds: acetaminophen **OR** acetaminophen (TYLENOL) oral liquid 160 mg/5 mL **OR** acetaminophen, albuterol, furosemide, metoprolol tartrate  Allergies:   No Known Allergies  Social  History:   Social History   Socioeconomic History   Marital status: Married    Spouse name: Not on file   Number of children: Not on file   Years of education: Not on file   Highest education level: Not on file  Occupational History   Not on file  Tobacco Use   Smoking status: Never   Smokeless tobacco: Never  Vaping Use   Vaping Use: Never used  Substance and Sexual Activity   Alcohol use: No   Drug use: No   Sexual activity: Yes    Birth control/protection: None  Other Topics Concern   Not on file  Social History Narrative   Not on file   Social Determinants of Health   Financial Resource Strain: Not on file  Food Insecurity: Not on file  Transportation Needs: Not on file  Physical Activity: Not on file  Stress: Not on file  Social Connections: Not on file  Intimate Partner Violence: Not on file    Family History:   Family History  Problem Relation Age of Onset   CAD Mother    Stroke Mother    Hypertension Mother    Prostate cancer Father    Diabetes Mellitus II Sister    Diabetes Mellitus II Brother    CAD Maternal Aunt     Family Status  Relation Name Status   Mother  Deceased   Father  Deceased   Sister  Alive   Brother   Alive   MGM  Deceased   MGF  Deceased   PGM  Deceased   PGF  Deceased   Mat Aunt  Deceased     ROS:  Please see the history of present illness.  No other information obtainable due to patient condition  Physical Exam/Data:   Vitals:   03/24/21 1445 03/24/21 1500 03/24/21 1515 03/24/21 1530  BP: (!) 172/80 (!) 170/83 (!) 158/86 (!) 169/75  Pulse: (!) 104 (!) 102 (!) 106 (!) 110  Resp: (!) 23 20 (!) 23 (!) 21  Temp:      TempSrc:      SpO2: 93% 95% 93% 93%  Weight:        Intake/Output Summary (Last 24 hours) at 03/24/2021 1615 Last data filed at 03/24/2021 1500 Gross per 24 hour  Intake 1609.99 ml  Output 350 ml  Net 1259.99 ml   Last 3 Weights 03/23/2021 03/16/2020 03/01/2020  Weight (lbs) 203 lb 11.3 oz 197 lb 6.4 oz (No Data)  Weight (kg) 92.4 kg 89.54 kg (No Data)     Body mass index is 36.08 kg/m.  General:  Well nourished, well developed, restrained and sedated HEENT: normal for age Lymph: no adenopathy Neck: no JVD seen, she is lying fairly flat Endocrine:  No thryomegaly Vascular: No carotid bruits; 4/4 extremity pulses 2+ bilaterally  Cardiac:  normal S1, S2; irregular rate and rhythm; no murmur  Lungs:  clear to auscultation anteriorly, no wheezing, rhonchi or rales  Abd: soft, nontender, no hepatomegaly  Ext: no edema Musculoskeletal:  No deformities, BUE and BLE strength not tested Skin: warm and dry  Neuro: Not able to assess due to sedation  EKG:  The EKG was personally reviewed and demonstrates:  03/23/2021: Atrial fibrillation, heart rate 89 diffuse ST depression had some differences from January 2021 ECG, unclear significance Telemetry:  Telemetry was personally reviewed and demonstrates: Atrial fibrillation, RVR at times  Relevant CV Studies:  ECHO: 03/24/2021 PENDING  ECHO: 05/26/2018 - Left ventricle: The cavity  size was normal. Wall thickness was    increased in a pattern of mild LVH. Systolic function was    vigorous. The estimated  ejection fraction was in the range of 65%    to 70%. Wall motion was normal; there were no regional wall    motion abnormalities.  - Mitral valve: Calcified annulus. There was trivial regurgitation.  - Right ventricle: The cavity size was mildly dilated. Wall    thickness was normal.  - Right atrium: The atrium was mildly dilated.   Laboratory Data:  High Sensitivity Troponin:  No results for input(s): TROPONINIHS in the last 720 hours.   Chemistry Recent Labs  Lab 03/23/21 1847 03/23/21 1858  NA 135 137  K 3.7 3.8  CL 101 103  CO2 23  --   GLUCOSE 217* 214*  BUN 16 21  CREATININE 0.90 0.80  CALCIUM 9.2  --   GFRNONAA >60  --   ANIONGAP 11  --     Recent Labs  Lab 03/23/21 1847  PROT 7.4  ALBUMIN 3.8  AST 24  ALT 23  ALKPHOS 88  BILITOT 0.6   Hematology Recent Labs  Lab 03/23/21 1847 03/23/21 1858  WBC 8.8  --   RBC 5.00  --   HGB 15.1* 15.6*  HCT 46.0 46.0  MCV 92.0  --   MCH 30.2  --   MCHC 32.8  --   RDW 12.5  --   PLT 233  --    BNPNo results for input(s): BNP, PROBNP in the last 168 hours.  DDimer No results for input(s): DDIMER in the last 168 hours.   Radiology/Studies:  DG Abd Portable 1V  Result Date: 03/24/2021 CLINICAL DATA:  Feeding tube placement EXAM: PORTABLE ABDOMEN - 1 VIEW COMPARISON:  Portable exam 1314 hours without priors for comparison FINDINGS: Tip of feeding tube projects over distal gastric antrum. Visualized bowel gas pattern normal. Atherosclerotic calcifications aorta and splenic artery. Prior median sternotomy and LEFT atrial appendage clipping. Subsegmental atelectasis at LEFT lung base. IMPRESSION: Tip of feeding tube projects over distal gastric antrum. Electronically Signed   By: Lavonia Dana M.D.   On: 03/24/2021 13:40   CT HEAD CODE STROKE WO CONTRAST  Result Date: 03/23/2021 CLINICAL DATA:  Code stroke.  Acute neuro deficit.  Aphasia EXAM: CT HEAD WITHOUT CONTRAST TECHNIQUE: Contiguous axial images were obtained from  the base of the skull through the vertex without intravenous contrast. COMPARISON:  None. FINDINGS: Brain: Mild atrophy and mild white matter hypodensity bilaterally. Negative for acute infarct, hemorrhage, mass. Vascular: Negative for hyperdense vessel Skull: Negative Sinuses/Orbits: Mild mucosal edema paranasal sinuses. Negative orbit Other: None ASPECTS ([place] Stroke Program Early CT Score) - Ganglionic level infarction (caudate, lentiform nuclei, internal capsule, insula, M1-M3 cortex): 7 - Supraganglionic infarction (M4-M6 cortex): 3 Total score (0-10 with 10 being normal): 10 IMPRESSION: 1. No acute abnormality. 2. ASPECTS is 10 3. Code stroke imaging results were communicated on 03/23/2021 at 6:59 pm to provider [place] via text page Electronically Signed   By: Franchot Gallo M.D.   On: 03/23/2021 19:00   CT ANGIO HEAD CODE STROKE  Result Date: 03/23/2021 CLINICAL DATA:  Expressive aphasia EXAM: CT ANGIOGRAPHY HEAD AND NECK TECHNIQUE: Multidetector CT imaging of the head and neck was performed using the standard protocol during bolus administration of intravenous contrast. Multiplanar CT image reconstructions and MIPs were obtained to evaluate the vascular anatomy. Carotid stenosis measurements (when applicable) are obtained utilizing NASCET criteria, using the distal  internal carotid diameter as the denominator. CONTRAST:  40mL OMNIPAQUE IOHEXOL 350 MG/ML SOLN COMPARISON:  None. FINDINGS: CTA NECK FINDINGS SKELETON: There is no bony spinal canal stenosis. No lytic or blastic lesion. OTHER NECK: Normal pharynx, larynx and major salivary glands. No cervical lymphadenopathy. Unremarkable thyroid gland. UPPER CHEST: No pneumothorax or pleural effusion. No nodules or masses. AORTIC ARCH: There is calcific atherosclerosis of the aortic arch. There is no aneurysm, dissection or hemodynamically significant stenosis of the visualized portion of the aorta. Conventional 3 vessel aortic branching pattern. The  visualized proximal subclavian arteries are widely patent. RIGHT CAROTID SYSTEM: No dissection, occlusion or aneurysm. Mild atherosclerotic calcification at the carotid bifurcation without hemodynamically significant stenosis. LEFT CAROTID SYSTEM: No dissection, occlusion or aneurysm. Mild atherosclerotic calcification at the carotid bifurcation without hemodynamically significant stenosis. VERTEBRAL ARTERIES: Left dominant configuration. Both origins are clearly patent. There is no dissection, occlusion or flow-limiting stenosis to the skull base (V1-V3 segments). CTA HEAD FINDINGS POSTERIOR CIRCULATION: --Vertebral arteries: Normal V4 segments. --Inferior cerebellar arteries: Normal. --Basilar artery: Normal. --Superior cerebellar arteries: Normal. --Posterior cerebral arteries (PCA): Normal. ANTERIOR CIRCULATION: --Intracranial internal carotid arteries: Normal. --Anterior cerebral arteries (ACA): Normal. Both A1 segments are present. Patent anterior communicating artery (a-comm). --Middle cerebral arteries (MCA): Normal. VENOUS SINUSES: As permitted by contrast timing, patent. ANATOMIC VARIANTS: None Review of the MIP images confirms the above findings. IMPRESSION: 1. No emergent large vessel occlusion or high-grade stenosis of the intracranial arteries. 2. Mild bilateral carotid bifurcation atherosclerosis without hemodynamically significant stenosis by NASCET criteria. Aortic Atherosclerosis (ICD10-I70.0). Electronically Signed   By: Ulyses Jarred M.D.   On: 03/23/2021 19:38   CT ANGIO NECK CODE STROKE  Result Date: 03/23/2021 : CLINICAL DATA:   Expressive aphasia EXAM: CT ANGIOGRAPHY HEAD AND NECK TECHNIQUE: Multidetector CT imaging of the head and neck was performed using the standard protocol during bolus administration of intravenous contrast. Multiplanar CT image reconstructions and MIPs were obtained to evaluate the vascular anatomy. Carotid stenosis measurements (when applicable) are obtained  utilizing NASCET criteria, using the distal internal carotid diameter as the denominator. CONTRAST:   30mL OMNIPAQUE IOHEXOL 350 MG/ML SOLN COMPARISON:   None. FINDINGS: CTA NECK FINDINGS SKELETON: There is no bony spinal canal stenosis. No lytic or blastic lesion. OTHER NECK: Normal pharynx, larynx and major salivary glands. No cervical lymphadenopathy. Unremarkable thyroid gland. UPPER CHEST: No pneumothorax or pleural effusion. No nodules or masses. AORTIC ARCH: There is calcific atherosclerosis of the aortic arch. There is no aneurysm, dissection or hemodynamically significant stenosis of the visualized portion of the aorta. Conventional 3 vessel aortic branching pattern. The visualized proximal subclavian arteries are widely patent. RIGHT CAROTID SYSTEM: No dissection, occlusion or aneurysm. Mild atherosclerotic calcification at the carotid bifurcation without hemodynamically significant stenosis. LEFT CAROTID SYSTEM: No dissection, occlusion or aneurysm. Mild atherosclerotic calcification at the carotid bifurcation without hemodynamically significant stenosis. VERTEBRAL ARTERIES: Left dominant configuration. Both origins are clearly patent. There is no dissection, occlusion or flow-limiting stenosis to the skull base (V1-V3 segments). CTA HEAD FINDINGS POSTERIOR CIRCULATION: --Vertebral arteries: Normal V4 segments. --Inferior cerebellar arteries: Normal. --Basilar artery: Normal. --Superior cerebellar arteries: Normal. --Posterior cerebral arteries (PCA): Normal. ANTERIOR CIRCULATION: --Intracranial internal carotid arteries: Normal. --Anterior cerebral arteries (ACA): Normal. Both A1 segments are present. Patent anterior communicating artery (a-comm). --Middle cerebral arteries (MCA): Normal. VENOUS SINUSES: As permitted by contrast timing, patent. ANATOMIC VARIANTS: None Review of the MIP images confirms the above findings. IMPRESSION: 1. No emergent large vessel occlusion or high-grade stenosis of  the  intracranial arteries. 2. Mild bilateral carotid bifurcation atherosclerosis without hemodynamically significant stenosis by NASCET criteria. Aortic Atherosclerosis (ICD10-I70.0). Electronically Signed   By: Ulyses Jarred M.D.   On: 03/23/2021 19:45     Assessment and Plan:   Chronic atrial fibrillation: -She has had some RVR in the setting of acute illness. -Prior to admission she was on Cardizem CD 190 mg daily and carvedilol 25 mg twice daily. - Both were discontinued secondary to n.p.o. status -Currently she is on Cardizem 60 mg oral suspension every 8 hours, equivalent to her prior to admission dose. - She is also on Lopressor 5 mg IV every 6 hours as needed for heart rate sustained greater than 120 - Agree with this plan as she is getting some medication to help control her heart rate chronically but also has as needed meds ordered for elevated heart rates - Prior to admission, she was on some herbal medication for anticoagulation as she refused warfarin, Xarelto, or Eliquis. -Her CHA2DS2-VASc is elevated, but will leave anticoagulation to Neurology.  2.  CVA: -Significant deficits requiring feeding tube, management per Neurology  Active Problems:   Stroke (cerebrum) (HCC)    Risk Assessment/Risk Scores:       :[id number]}   CHA2DS2-VASc Score = 8  This indicates a 10.8% annual risk of stroke. The patient's score is based upon: CHF History: No HTN History: Yes Diabetes History: Yes Stroke History: Yes Vascular Disease History: Yes Age Score: 2 Gender Score: 1     For questions or updates, please contact [place] Please consult [email or web address] for contact info under    Signed, Rosaria Ferries, PA-C  03/24/2021 4:15 PM

## 2021-03-24 NOTE — Progress Notes (Signed)
  Echocardiogram Heart rate is too high for accurate echo at this time.  Johny Chess 03/24/2021, 9:18 AM

## 2021-03-24 NOTE — Progress Notes (Signed)
Initial Nutrition Assessment  DOCUMENTATION CODES:   Obesity unspecified  INTERVENTION:   Once Cortrak placed and placement confirmed, initiate tube feeds: - Jevity 1.2 @ 65 ml/hr (1560 ml/day) - ProSource TF 45 ml daily  Tube feeding regimen provides 1912 kcal, 98 grams of protein, and 1259 ml of H2O.   NUTRITION DIAGNOSIS:   Inadequate oral intake related to inability to eat as evidenced by NPO status.  GOAL:   Patient will meet greater than or equal to 90% of their needs  MONITOR:   Diet advancement, Labs, Weight trends, TF tolerance, I & O's  REASON FOR ASSESSMENT:   Consult Enteral/tube feeding initiation and management  ASSESSMENT:   75 year old female who presented to the ED on 6/16 with acute onset of dense receptive and expressive aphasia. PMH of atrial fibrillation, asthma, CHF, CAD s/p CABG, HTN.  Pt admitted with stroke. MRI pending. Discussed pt with RN. MBS cancelled today due to issues with heart rate per notes. Consult received for tube feeding initiation and management. Plan is for Cortrak placement today.  Met with pt at bedside. Pt not responding to RD touch or voice. Pt's pastor in room at time of RD visit.  Reviewed weight history in chart. Pt's weight has been stable with slight fluctuations between 88-92 kg over the last 2 years.  Medications reviewed and include: melatonin, IV protonix, senna, cleviprex drip @ 42 ml/hr IVF: NS @ 25 ml/hr  Labs reviewed: ionized calcium 1.13, TG 1219 CBG's: 217  I/O's: +1.0 L since admit  NUTRITION - FOCUSED PHYSICAL EXAM:  Flowsheet Row Most Recent Value  Orbital Region No depletion  Upper Arm Region No depletion  Thoracic and Lumbar Region No depletion  Buccal Region No depletion  Temple Region No depletion  Clavicle Bone Region No depletion  Clavicle and Acromion Bone Region No depletion  Scapular Bone Region No depletion  Dorsal Hand No depletion  Patellar Region No depletion  Anterior Thigh  Region No depletion  Posterior Calf Region No depletion  Edema (RD Assessment) None  Hair Reviewed  Eyes Unable to assess  Mouth Reviewed  Skin Reviewed  Nails Reviewed       Diet Order:   Diet Order             Diet NPO time specified  Diet effective now                   EDUCATION NEEDS:   Not appropriate for education at this time  Skin:  Skin Assessment: Reviewed RN Assessment  Last BM:  no documented BM  Height:   Ht Readings from Last 1 Encounters:  03/16/20 $RemoveB'5\' 3"'[id number]$  (1.6 m)    Weight:   Wt Readings from Last 1 Encounters:  03/23/21 92.4 kg    BMI:  Body mass index is 36.08 kg/m.  Estimated Nutritional Needs:   Kcal:  5038-8828  Protein:  85-105 grams  Fluid:  1.7-1.9 L    Gustavus Bryant, MS, RD, LDN Inpatient Clinical Dietitian Please see AMiON for contact information.

## 2021-03-24 NOTE — Progress Notes (Signed)
PT Cancellation Note  Patient Details Name: Danielle Payne MRN: [id number] DOB: 01/06/1946   Cancelled Treatment:    Reason Eval/Treat Not Completed: Active bedrest order. Will follow-up as time permits and as medically appropriate.   Moishe Spice, PT, DPT Acute Rehabilitation Services  Pager: [phone] Office: [place] 03/24/2021, 7:26 AM

## 2021-03-24 NOTE — Progress Notes (Addendum)
SLP Cancellation Note  Patient Details Name: Danielle Payne MRN: [id number] DOB: 08/11/1946   Cancelled treatment:        MBS cancelled for now. Pt have HR issues. Dr Erlinda Hong advises to cancel MBS for today.   [name] 03/24/2021, 10:50 AM [name] Caroli.Ed Risk analyst [phone] Office [phone]

## 2021-03-24 NOTE — Procedures (Signed)
Cortrak  Person Inserting Tube:  Esaw Dace, RD Tube Type:  Cortrak - 43 inches Tube Size:  10 Tube Location:  Right nare Initial Placement:  Stomach Secured by: Bridle Technique Used to Measure Tube Placement:  Marking at nare/corner of mouth Cortrak Secured At:  67 cm  Cortrak Tube Team Note:  Consult received to place a Cortrak feeding tube.   X-ray is required, abdominal x-ray has been ordered by the Cortrak team. Please confirm tube placement before using the Cortrak tube.   If the tube becomes dislodged please keep the tube and contact the Cortrak team at [email or web address] ([email or web address]) for replacement.  If after hours and replacement cannot be delayed, place a NG tube and confirm placement with an abdominal x-ray.   Kerman Passey MS, RDN, LDN, CNSC Registered Dietitian III Clinical Nutrition RD Pager and On-Call Pager Number Located in [place]

## 2021-03-25 ENCOUNTER — Inpatient Hospital Stay (HOSPITAL_COMMUNITY): Payer: Medicare Other

## 2021-03-25 DIAGNOSIS — I639 Cerebral infarction, unspecified: Secondary | ICD-10-CM

## 2021-03-25 DIAGNOSIS — G9341 Metabolic encephalopathy: Secondary | ICD-10-CM

## 2021-03-25 DIAGNOSIS — I4819 Other persistent atrial fibrillation: Secondary | ICD-10-CM

## 2021-03-25 LAB — BASIC METABOLIC PANEL
Anion gap: 11 (ref 5–15)
BUN: 34 mg/dL — ABNORMAL HIGH (ref 8–23)
CO2: 23 mmol/L (ref 22–32)
Calcium: 9.1 mg/dL (ref 8.9–10.3)
Chloride: 101 mmol/L (ref 98–111)
Creatinine, Ser: 1.28 mg/dL — ABNORMAL HIGH (ref 0.44–1.00)
GFR, Estimated: 43 mL/min — ABNORMAL LOW (ref >60)
Glucose, Bld: 256 mg/dL — ABNORMAL HIGH (ref 70–99)
Potassium: 3.4 mmol/L — ABNORMAL LOW (ref 3.5–5.1)
Sodium: 135 mmol/L (ref 135–145)

## 2021-03-25 LAB — GLUCOSE, CAPILLARY
Glucose-Capillary: 219 mg/dL — ABNORMAL HIGH (ref 70–99)
Glucose-Capillary: 223 mg/dL — ABNORMAL HIGH (ref 70–99)
Glucose-Capillary: 239 mg/dL — ABNORMAL HIGH (ref 70–99)
Glucose-Capillary: 266 mg/dL — ABNORMAL HIGH (ref 70–99)
Glucose-Capillary: 300 mg/dL — ABNORMAL HIGH (ref 70–99)
Glucose-Capillary: 323 mg/dL — ABNORMAL HIGH (ref 70–99)

## 2021-03-25 LAB — PHOSPHORUS
Phosphorus: 3 mg/dL (ref 2.5–4.6)
Phosphorus: 3.5 mg/dL (ref 2.5–4.6)

## 2021-03-25 LAB — CBC
HCT: 41 % (ref 36.0–46.0)
Hemoglobin: 13.8 g/dL (ref 12.0–15.0)
MCH: 29.6 pg (ref 26.0–34.0)
MCHC: 33.7 g/dL (ref 30.0–36.0)
MCV: 88 fL (ref 80.0–100.0)
Platelets: 238 10*3/uL (ref 150–400)
RBC: 4.66 MIL/uL (ref 3.87–5.11)
RDW: 13.2 % (ref 11.5–15.5)
WBC: 17 10*3/uL — ABNORMAL HIGH (ref 4.0–10.5)
nRBC: 0 % (ref 0.0–0.2)

## 2021-03-25 LAB — HEMOGLOBIN A1C
Hgb A1c MFr Bld: 8.8 % — ABNORMAL HIGH (ref 4.8–5.6)
Mean Plasma Glucose: 206 mg/dL

## 2021-03-25 LAB — MAGNESIUM
Magnesium: 2.1 mg/dL (ref 1.7–2.4)
Magnesium: 2.1 mg/dL (ref 1.7–2.4)

## 2021-03-25 LAB — SODIUM
Sodium: 132 mmol/L — ABNORMAL LOW (ref 135–145)
Sodium: 137 mmol/L (ref 135–145)
Sodium: 142 mmol/L (ref 135–145)

## 2021-03-25 MED ORDER — SODIUM CHLORIDE 3 % IV SOLN
INTRAVENOUS | Status: DC
  Filled 2021-03-25 (×6): qty 500

## 2021-03-25 MED ORDER — INSULIN GLARGINE 100 UNIT/ML ~~LOC~~ SOLN
10.0000 [IU] | Freq: Two times a day (BID) | SUBCUTANEOUS | Status: DC
  Administered 2021-03-25 – 2021-03-26 (×4): 10 [IU] via SUBCUTANEOUS
  Filled 2021-03-25 (×6): qty 0.1

## 2021-03-25 MED ORDER — NICARDIPINE HCL IN NACL 20-0.86 MG/200ML-% IV SOLN
3.0000 mg/h | INTRAVENOUS | Status: DC
  Administered 2021-03-25 (×2): 5 mg/h via INTRAVENOUS
  Administered 2021-03-25: 7 mg/h via INTRAVENOUS
  Administered 2021-03-26: 1 mg/h via INTRAVENOUS
  Administered 2021-03-26: 10 mg/h via INTRAVENOUS
  Administered 2021-03-26: 4 mg/h via INTRAVENOUS
  Administered 2021-03-26: 12 mg/h via INTRAVENOUS
  Administered 2021-03-26: 10 mg/h via INTRAVENOUS
  Administered 2021-03-27 (×2): 8 mg/h via INTRAVENOUS
  Administered 2021-03-27: 6 mg/h via INTRAVENOUS
  Administered 2021-03-28 (×2): 5 mg/h via INTRAVENOUS
  Administered 2021-03-28: 7.5 mg/h via INTRAVENOUS
  Administered 2021-03-29: 5 mg/h via INTRAVENOUS
  Filled 2021-03-25 (×9): qty 200
  Filled 2021-03-25: qty 400
  Filled 2021-03-25 (×6): qty 200

## 2021-03-25 MED ORDER — POTASSIUM CHLORIDE 10 MEQ/100ML IV SOLN
10.0000 meq | INTRAVENOUS | Status: AC
  Administered 2021-03-25 (×3): 10 meq via INTRAVENOUS
  Filled 2021-03-25 (×3): qty 100

## 2021-03-25 NOTE — Consult Note (Signed)
NAME:  Danielle Payne, MRN:  [id number], DOB:  01-02-1946, LOS: 2 ADMISSION DATE:  03/23/2021, CONSULTATION DATE: 03/26/2019 REFERRING MD:  Dr. Leonie Man, CHIEF COMPLAINT:  Acute onset of aphasia and altered mental status   History of Present Illness:  75 year old female with history of chronic atrial fibrillation, chronic combined systolic and diastolic congestive heart failure, hypertension and coronary artery disease who initially presented with sudden onset of nausea aphasia and altered mental status.  She was diagnosed with acute left MCA stroke, received tPA and was admitted to neurosurgical ICU for close monitoring. Over next 24 hours patient started getting lethargic, she had hemorrhagic changes and a left ischemic stroke with hematoma. She is being started on 3 Saline for cerebral edema with midline shift and, PCCM was consulted for help with management  Pertinent  Medical History   Past Medical History:  Diagnosis Date   Allergy    Asthma    Atrial fibrillation ([place])    Chronic combined systolic and diastolic heart failure ([place]) 05/14/2018   Coronary artery disease    Diverticulitis    Diverticulitis    Hypertension    Pure hypercholesterolemia 05/14/2018   Stented coronary artery      Significant Hospital Events: Including procedures, antibiotic start and stop dates in addition to other pertinent events   6/16 admitted s/p tPA 6/18, CT head showed increasing cerebral edema with hemorrhagic conversion and midline shift, PCCM was consulted  Interim History / Subjective:    Objective   Blood pressure 129/70, pulse 96, temperature 99 F (37.2 C), temperature source Axillary, resp. rate (!) 23, height 5\' 3"  (1.6 m), weight 90 kg, SpO2 95 %.        Intake/Output Summary (Last 24 hours) at 03/25/2021 1248 Last data filed at 03/25/2021 1100 Gross per 24 hour  Intake 1896.51 ml  Output 975 ml  Net 921.51 ml   Filed Weights   03/23/21 1800 03/25/21 0400  Weight: 92.4 kg 90  kg    Examination:   Physical exam: General: Crtitically ill-appearing female, lying on the bed HEENT: [place]/AT, eyes anicteric.  Dry mucous membranes Neuro: Lethargic, opens eyes with vocal stimuli, not following commands, withdrawing in bilateral lower extremities minimal movement in right upper extremity, antigravity in left upper extremity Chest: Coarse breath sounds, no wheezes or rhonchi Heart: Irregularly irregular, no murmurs or gallops Abdomen: Soft, nontender, nondistended, bowel sounds present Skin: No rash  Labs/imaging that I havepersonally reviewed  (right click and "Reselect all SmartList Selections" daily)  1. Stable Left MCA infarct with hemorrhagic transformation since yesterday. Extension to the Left MCA/PCA watershed territory as before. Partially effaced left lateral ventricle and trace rightward midline shift are stable. No extra-axial extension of blood.  Resolved Hospital Problem list     Assessment & Plan:  Acute left MCA stroke s/p tPA, complicated with hemorrhagic transformation Cytotoxic cerebral edema with midline shift Acute encephalopathy due to acute stroke Chronic atrial fibrillation not on anticoagulation Hypertension Hyperlipidemia Dysphagia due to acute stroke Hypokalemia Poorly controlled GERD diabetes type 2 Acute kidney injury due to dehydration  Stroke team is following Continue neuro watch every hour Hold antiplatelet transformation Continue atorvastatin Avoid sedation Started on 3% saline Closely monitor serum sodium with a goal 150-155 Continue to hold anticoagulation Continue with Coreg and diltiazem for heart rate control Keep SBP less than 140 Continue tube feeds Electrolytes are being supplemented Started on Lantus and continue sliding scale Her hemoglobin A1c is > 8 Female who is 140-180 Monitor serum  creatinine and electrolytes  Best Practice (right click and "Reselect all SmartList Selections" daily)   Diet/type:  tubefeeds Pain/Anxiety/Delirium protocol Not indicated VAP protocol (if indicated): Not indicated DVT prophylaxis: SCD GI prophylaxis: N/A Glucose control:  SSI and Basal coverage Central venous access:  N/A Arterial line:  N/A Foley:  N/A Mobility:  bed rest  PT consulted: Yes Code Status:  full code Last date of multidisciplinary goals of care discussion [Per primary team] Disposition: remains critically ill, will stay in intensive care        Labs   CBC: Recent Labs  Lab 03/23/21 1847 03/23/21 1858 03/25/21 0610  WBC 8.8  --  17.0*  NEUTROABS 4.8  --   --   HGB 15.1* 15.6* 13.8  HCT 46.0 46.0 41.0  MCV 92.0  --  88.0  PLT 233  --  213    Basic Metabolic Panel: Recent Labs  Lab 03/23/21 1847 03/23/21 1858 03/24/21 1650 03/25/21 0610 03/25/21 0921  NA 135 137  --  135 132*  K 3.7 3.8  --  3.4*  --   CL 101 103  --  101  --   CO2 23  --   --  23  --   GLUCOSE 217* 214*  --  256*  --   BUN 16 21  --  34*  --   CREATININE 0.90 0.80  --  1.28*  --   CALCIUM 9.2  --   --  9.1  --   MG  --   --  1.8 2.1  --   PHOS  --   --  2.5 3.0  --    GFR: Estimated Creatinine Clearance: 39.8 mL/min (A) (by C-G formula based on SCr of 1.28 mg/dL (H)). Recent Labs  Lab 03/23/21 1847 03/25/21 0610  WBC 8.8 17.0*    Liver Function Tests: Recent Labs  Lab 03/23/21 1847  AST 24  ALT 23  ALKPHOS 88  BILITOT 0.6  PROT 7.4  ALBUMIN 3.8   No results for input(s): LIPASE, AMYLASE in the last 168 hours. No results for input(s): AMMONIA in the last 168 hours.  ABG    Component Value Date/Time   TCO2 27 03/23/2021 1858     Coagulation Profile: Recent Labs  Lab 03/23/21 1847  INR 1.1    Cardiac Enzymes: No results for input(s): CKTOTAL, CKMB, CKMBINDEX, TROPONINI in the last 168 hours.  HbA1C: Hemoglobin A1C  Date/Time Value Ref Range Status  03/04/2020 12:00 AM 9.1  Final   Hgb A1c MFr Bld  Date/Time Value Ref Range Status  03/24/2021 05:10 AM 8.8  (H) 4.8 - 5.6 % Final    Comment:    (NOTE)         Prediabetes: 5.7 - 6.4         Diabetes: >6.4         Glycemic control for adults with diabetes: <7.0   11/18/2019 12:46 PM 7.9 (H) 4.8 - 5.6 % Final    Comment:             Prediabetes: 5.7 - 6.4          Diabetes: >6.4          Glycemic control for adults with diabetes: <7.0     CBG: Recent Labs  Lab 03/24/21 1922 03/24/21 2314 03/25/21 0315 03/25/21 0739 03/25/21 1143  GLUCAP 308* 309* 219* 323* 300*    Review of Systems:   unable to obtain due to encephalopathy  Past Medical History:  She,  has a past medical history of Allergy, Asthma, Atrial fibrillation ([place]), Chronic combined systolic and diastolic heart failure ([place]) (05/14/2018), Coronary artery disease, Diverticulitis, Diverticulitis, Hypertension, Pure hypercholesterolemia (05/14/2018), and Stented coronary artery.   Surgical History:   Past Surgical History:  Procedure Laterality Date   ABDOMINAL HYSTERECTOMY     CARDIAC CATHETERIZATION     CARDIOVERSION N/A 12/21/2014   Procedure: CARDIOVERSION;  Surgeon: Adrian Prows, MD;  Location: [facility];  Service: Cardiovascular;  Laterality: N/A;   CORONARY ANGIOPLASTY     CORONARY ARTERY BYPASS GRAFT       Social History:   reports that she has never smoked. She has never used smokeless tobacco. She reports that she does not drink alcohol and does not use drugs.   Family History:  Her family history includes CAD in her maternal aunt and mother; Diabetes Mellitus II in her brother and sister; Hypertension in her mother; Prostate cancer in her father; Stroke in her mother.   Allergies No Known Allergies   Home Medications  Prior to Admission medications   Medication Sig Start Date End Date Taking? Authorizing Provider  albuterol (VENTOLIN HFA) 108 (90 Base) MCG/ACT inhaler INHALE TWO PUFFS INTO THE LUNGS EVERY 4 HOURS AS NEEDED FOR WHEEZING OR SHORTNESS OF BREATH 02/02/20   Glendale Chard, MD  carvedilol  (COREG) 25 MG tablet Take 1 tablet (25 mg total) by mouth 2 (two) times daily with a meal. 03/09/20   Skeet Latch, MD  diltiazem (CARDIZEM CD) 180 MG 24 hr capsule Take 1 capsule (180 mg total) by mouth daily. 03/31/20   Skeet Latch, MD  furosemide (LASIX) 40 MG tablet Take 40 mg by mouth daily as needed for fluid or edema.     [provider]  loratadine (CLARITIN) 10 MG tablet Take 10 mg by mouth daily.    [provider]  Melatonin 5 MG TBDP Take 5 mg by mouth at bedtime.     [provider]  NON FORMULARY Natural nasal spray nedi pot    [provider]  NON FORMULARY nattokinasase blood thinner    [provider]  Probiotic Product (PROBIOTIC PO) Take by mouth.    [provider]  Protease POWD by Does not apply route.    [provider]     Total critical care time: 42 minutes  Performed by: [place] care time was exclusive of separately billable procedures and treating other patients.   Critical care was necessary to treat or prevent imminent or life-threatening deterioration.   Critical care was time spent personally by me on the following activities: development of treatment plan with patient and/or surrogate as well as nursing, discussions with consultants, evaluation of patient's response to treatment, examination of patient, obtaining history from patient or surrogate, ordering and performing treatments and interventions, ordering and review of laboratory studies, ordering and review of radiographic studies, pulse oximetry and re-evaluation of patient's condition.   Jacky Kindle MD [facility] See Amion for pager If no response to pager, please call [phone] until 7pm After 7pm, Please call E-link [phone]

## 2021-03-25 NOTE — Progress Notes (Signed)
SLP Cancellation Note  Patient Details Name: [name] MRN: [id number] DOB: 07/30/1946   Cancelled treatment:       Reason Eval/Treat Not Completed: Medical issues which prohibited therapy  Per RN patient lethargic and will likely be unable to participate in MBS this date.  ST will follow back up to assess for readiness for MBS.    Shelly Flatten, MA, [facility] SLP [phone]  Lamar Sprinkles 03/25/2021, 8:58 AM

## 2021-03-25 NOTE — Progress Notes (Signed)
STROKE TEAM PROGRESS NOTE   SUBJECTIVE (INTERVAL HISTORY) Her husband is  at the bedside.   .  She is still sleepy and has been having some increased work of breathing this morning.  Pt still has global aphasia, mild right facial droop and right arm weakness. Still has afib RVR with HR 120s. Did not pass swallow and now has a core track tube.  Chest x-ray shows left lower lobe infiltrate.  She has been started on antibiotics.  Critical care team has been consulted. CT head this morning shows stable left MCA infarct with hemorrhagic transformation with rightward midline shift and slight effacement of left lateral ventricle. Talked with husband at the bedside and with her daughter-in-law who is a Designer, jewellery over the phone.  Patient chose to take herself off warfarin and try natural treatment for A. fib   Have resumed home meds after cortrak placed.    OBJECTIVE Temp:  [98.3 F (36.8 C)-103 F (39.4 C)] 99 F (37.2 C) (06/18 1200) Pulse Rate:  [76-131] 96 (06/18 1200) Cardiac Rhythm: Atrial fibrillation (06/18 0800) Resp:  [15-27] 23 (06/18 1200) BP: (94-188)/(52-154) 129/70 (06/18 1200) SpO2:  [90 %-97 %] 95 % (06/18 1200) Weight:  [90 kg] 90 kg (06/18 0400)  Recent Labs  Lab 03/24/21 1922 03/24/21 2314 03/25/21 0315 03/25/21 0739 03/25/21 1143  GLUCAP 308* 309* 219* 323* 300*   Recent Labs  Lab 03/23/21 1847 03/23/21 1858 03/24/21 1650 03/25/21 0610 03/25/21 0921  NA 135 137  --  135 132*  K 3.7 3.8  --  3.4*  --   CL 101 103  --  101  --   CO2 23  --   --  23  --   GLUCOSE 217* 214*  --  256*  --   BUN 16 21  --  34*  --   CREATININE 0.90 0.80  --  1.28*  --   CALCIUM 9.2  --   --  9.1  --   MG  --   --  1.8 2.1  --   PHOS  --   --  2.5 3.0  --    Recent Labs  Lab 03/23/21 1847  AST 24  ALT 23  ALKPHOS 88  BILITOT 0.6  PROT 7.4  ALBUMIN 3.8   Recent Labs  Lab 03/23/21 1847 03/23/21 1858 03/25/21 0610  WBC 8.8  --  17.0*  NEUTROABS 4.8  --   --    HGB 15.1* 15.6* 13.8  HCT 46.0 46.0 41.0  MCV 92.0  --  88.0  PLT 233  --  238   No results for input(s): CKTOTAL, CKMB, CKMBINDEX, TROPONINI in the last 168 hours. Recent Labs    03/23/21 1847  LABPROT 14.4  INR 1.1   Recent Labs    03/24/21 0126  COLORURINE YELLOW  LABSPEC 1.020  PHURINE 7.0  GLUCOSEU 150*  HGBUR SMALL*  BILIRUBINUR NEGATIVE  KETONESUR 5*  PROTEINUR 30*  NITRITE NEGATIVE  LEUKOCYTESUR NEGATIVE       Component Value Date/Time   CHOL 126 03/24/2021 0510   CHOL 191 11/05/2019 0902   TRIG 1,219 (H) 03/24/2021 0510   HDL 27 (L) 03/24/2021 0510   HDL 39 (L) 11/05/2019 0902   CHOLHDL NOT REPORTED DUE TO HIGH TRIGLYCERIDES 03/24/2021 0510   VLDL UNABLE TO CALCULATE IF TRIGLYCERIDE OVER 400 mg/dL 03/24/2021 0510   LDLCALC UNABLE TO CALCULATE IF TRIGLYCERIDE OVER 400 mg/dL 03/24/2021 0510   LDLCALC 119 (H) 11/05/2019 0902  Lab Results  Component Value Date   HGBA1C 8.8 (H) 03/24/2021      Component Value Date/Time   LABOPIA NONE DETECTED 03/24/2021 0126   COCAINSCRNUR NONE DETECTED 03/24/2021 0126   LABBENZ NONE DETECTED 03/24/2021 0126   AMPHETMU NONE DETECTED 03/24/2021 0126   THCU NONE DETECTED 03/24/2021 0126   LABBARB NONE DETECTED 03/24/2021 0126    Recent Labs  Lab 03/23/21 1847  ETH <10    I have personally reviewed the radiological images below and agree with the radiology interpretations.  CT HEAD WO CONTRAST  Result Date: 03/25/2021 CLINICAL DATA:  75 year old female code stroke presentation status post IV tPA. Left MCA territory infarct with mild hemorrhagic changes. EXAM: CT HEAD WITHOUT CONTRAST TECHNIQUE: Contiguous axial images were obtained from the base of the skull through the vertex without intravenous contrast. COMPARISON:  Head CT 03/24/2021 and earlier. FINDINGS: Brain: Mixed cytotoxic edema and rounded, nodular areas of hemorrhage redemonstrated in the left MCA territory. The several dominant foci of blood in the  posterior left frontal and left parietal lobes continuing toward the left occipital pole are stable and size and configuration since yesterday (e.g. Series 4, image 22). Partial effacement of the left lateral ventricle and 2-3 mm of rightward midline shift are stable. No intraventricular hemorrhage. Probable gyral petechial hemorrhage at the left temporal operculum is stable on series 4, image 18. No definite extra-axial hemorrhage. Right hemisphere and posterior fossa gray-white matter differentiation is stable and within normal limits. No ventriculomegaly. Basilar cisterns remain patent. Vascular: Calcified atherosclerosis at the skull base. Skull: No acute osseous abnormality identified. Sinuses/Orbits: Visualized paranasal sinuses and mastoids are stable and well aerated. Other: Stable right nasoenteric tube. Mild leftward gaze is stable. Visualized scalp soft tissues are within normal limits. IMPRESSION: 1. Stable Left MCA infarct with hemorrhagic transformation since yesterday. Extension to the Left MCA/PCA watershed territory as before. Partially effaced left lateral ventricle and trace rightward midline shift are stable. No extra-axial extension of blood. 2. No new areas of involvement or new intracranial abnormality. Electronically Signed   By: Genevie Ann M.D.   On: 03/25/2021 05:19   CT HEAD WO CONTRAST  Result Date: 03/24/2021 CLINICAL DATA:  Stroke.  24 hours post tPA EXAM: CT HEAD WITHOUT CONTRAST TECHNIQUE: Contiguous axial images were obtained from the base of the skull through the vertex without intravenous contrast. COMPARISON:  CT head 03/23/2021 FINDINGS: Brain: Interval development of left MCA infarct with hypodensity in the left frontal and parietal lobe. There is also involvement of the left occipital lobe. There is hemorrhagic transformation of the infarct involving the left temporal and parietal lobe with a moderate amount of blood present. Ventricle size normal.  No midline shift. Vascular:  Negative for hyperdense vessel Skull: Negative Sinuses/Orbits: Mucosal edema paranasal sinuses. Feeding tube in place. Air-fluid level sphenoid sinus. Negative orbit. Other: None IMPRESSION: Interval development of acute infarct in the left MCA territory with hypodensity in the left temporal and parietal lobe. This also extends into the left occipital lobe. There has been interval hemorrhagic transformation of the infarct with moderate amount of blood present within the infarction. Dr. Cheral Marker  has been paged to discuss the results. Electronically Signed   By: Franchot Gallo M.D.   On: 03/24/2021 18:30   DG CHEST PORT 1 VIEW  Result Date: 03/24/2021 CLINICAL DATA:  Fevers EXAM: PORTABLE CHEST 1 VIEW COMPARISON:  11/20/2018 FINDINGS: Cardiac shadow is enlarged. Postsurgical changes are again seen. Feeding catheter is noted extending into the  stomach. Lungs are hypoinflated. Mild vascular congestion is noted without parenchymal edema. Left basilar opacity is noted which may represent early infiltrate. IMPRESSION: Mild CHF without pulmonary edema. Left basilar opacity which may represent an early infiltrate. Electronically Signed   By: Inez Catalina M.D.   On: 03/24/2021 17:54   DG Abd Portable 1V  Result Date: 03/24/2021 CLINICAL DATA:  Feeding tube placement EXAM: PORTABLE ABDOMEN - 1 VIEW COMPARISON:  Portable exam 1314 hours without priors for comparison FINDINGS: Tip of feeding tube projects over distal gastric antrum. Visualized bowel gas pattern normal. Atherosclerotic calcifications aorta and splenic artery. Prior median sternotomy and LEFT atrial appendage clipping. Subsegmental atelectasis at LEFT lung base. IMPRESSION: Tip of feeding tube projects over distal gastric antrum. Electronically Signed   By: Lavonia Dana M.D.   On: 03/24/2021 13:40   ECHOCARDIOGRAM COMPLETE  Result Date: 03/24/2021    ECHOCARDIOGRAM REPORT   Patient Name:   Danielle Payne Date of Exam: 03/24/2021 Medical Rec #:   [id number]       Height:       63.0 in Accession #:    [id number]      Weight:       203.7 lb Date of Birth:  02/15/1946      BSA:          1.949 m Patient Age:    1 years        BP:           169/83 mmHg Patient Gender: F               HR:           109 bpm. Exam Location:  Inpatient Procedure: 2D Echo Indications:    stroke  History:        Patient has prior history of Echocardiogram examinations, most                 recent 05/26/2018. CAD, Arrythmias:Atrial Fibrillation; Risk                 Factors:Diabetes and Hypertension.  Sonographer:    Johny Chess Referring Phys: [id number] HUNTER J COLLINS  Sonographer Comments: Suboptimal apical window. Image acquisition challenging due to uncooperative patient, Image acquisition challenging due to patient body habitus and restraints. kept moving. IMPRESSIONS  1. Left ventricular ejection fraction, by estimation, is 60 to 65%. The left ventricle has normal function. The left ventricle has no regional wall motion abnormalities. Left ventricular diastolic function could not be evaluated.  2. Right ventricular systolic function is normal. The right ventricular size is normal.  3. The mitral valve is grossly normal. No evidence of mitral valve regurgitation. No evidence of mitral stenosis.  4. The aortic valve is grossly normal. Aortic valve regurgitation is mild. No aortic stenosis is present. FINDINGS  Left Ventricle: Left ventricular ejection fraction, by estimation, is 60 to 65%. The left ventricle has normal function. The left ventricle has no regional wall motion abnormalities. The left ventricular internal cavity size was normal in size. There is  borderline left ventricular hypertrophy. Left ventricular diastolic function could not be evaluated due to atrial fibrillation. Left ventricular diastolic function could not be evaluated. Right Ventricle: The right ventricular size is normal. Right vetricular wall thickness was not well visualized. Right ventricular  systolic function is normal. Left Atrium: Left atrial size was normal in size. Right Atrium: Right atrial size was not well visualized. Pericardium: There is no evidence of pericardial effusion. Mitral Valve: The  mitral valve is grossly normal. No evidence of mitral valve regurgitation. No evidence of mitral valve stenosis. Tricuspid Valve: The tricuspid valve is grossly normal. Tricuspid valve regurgitation is not demonstrated. Aortic Valve: The aortic valve is grossly normal. Aortic valve regurgitation is mild. No aortic stenosis is present. Pulmonic Valve: The pulmonic valve was grossly normal. Pulmonic valve regurgitation is not visualized. Aorta: The aortic root and ascending aorta are structurally normal, with no evidence of dilitation. IAS/Shunts: The atrial septum is grossly normal.  LEFT VENTRICLE PLAX 2D LVIDd:         4.60 cm LVIDs:         3.40 cm LV PW:         1.10 cm LV IVS:        1.30 cm LVOT diam:     1.90 cm LVOT Area:     2.84 cm  IVC IVC diam: 2.20 cm LEFT ATRIUM           Index LA diam:      4.40 cm 2.26 cm/m LA Vol (A4C): 60.2 ml 30.89 ml/m   AORTA Ao Root diam: 2.80 cm Ao Asc diam:  3.20 cm  SHUNTS Systemic Diam: 1.90 cm Mertie Moores MD Electronically signed by Mertie Moores MD Signature Date/Time: 03/24/2021/5:19:01 PM    Final    CT HEAD CODE STROKE WO CONTRAST  Result Date: 03/23/2021 CLINICAL DATA:  Code stroke.  Acute neuro deficit.  Aphasia EXAM: CT HEAD WITHOUT CONTRAST TECHNIQUE: Contiguous axial images were obtained from the base of the skull through the vertex without intravenous contrast. COMPARISON:  None. FINDINGS: Brain: Mild atrophy and mild white matter hypodensity bilaterally. Negative for acute infarct, hemorrhage, mass. Vascular: Negative for hyperdense vessel Skull: Negative Sinuses/Orbits: Mild mucosal edema paranasal sinuses. Negative orbit Other: None ASPECTS ([place] Stroke Program Early CT Score) - Ganglionic level infarction (caudate, lentiform nuclei,  internal capsule, insula, M1-M3 cortex): 7 - Supraganglionic infarction (M4-M6 cortex): 3 Total score (0-10 with 10 being normal): 10 IMPRESSION: 1. No acute abnormality. 2. ASPECTS is 10 3. Code stroke imaging results were communicated on 03/23/2021 at 6:59 pm to provider [place] via text page Electronically Signed   By: Franchot Gallo M.D.   On: 03/23/2021 19:00   CT ANGIO HEAD CODE STROKE  Result Date: 03/23/2021 CLINICAL DATA:  Expressive aphasia EXAM: CT ANGIOGRAPHY HEAD AND NECK TECHNIQUE: Multidetector CT imaging of the head and neck was performed using the standard protocol during bolus administration of intravenous contrast. Multiplanar CT image reconstructions and MIPs were obtained to evaluate the vascular anatomy. Carotid stenosis measurements (when applicable) are obtained utilizing NASCET criteria, using the distal internal carotid diameter as the denominator. CONTRAST:  31mL OMNIPAQUE IOHEXOL 350 MG/ML SOLN COMPARISON:  None. FINDINGS: CTA NECK FINDINGS SKELETON: There is no bony spinal canal stenosis. No lytic or blastic lesion. OTHER NECK: Normal pharynx, larynx and major salivary glands. No cervical lymphadenopathy. Unremarkable thyroid gland. UPPER CHEST: No pneumothorax or pleural effusion. No nodules or masses. AORTIC ARCH: There is calcific atherosclerosis of the aortic arch. There is no aneurysm, dissection or hemodynamically significant stenosis of the visualized portion of the aorta. Conventional 3 vessel aortic branching pattern. The visualized proximal subclavian arteries are widely patent. RIGHT CAROTID SYSTEM: No dissection, occlusion or aneurysm. Mild atherosclerotic calcification at the carotid bifurcation without hemodynamically significant stenosis. LEFT CAROTID SYSTEM: No dissection, occlusion or aneurysm. Mild atherosclerotic calcification at the carotid bifurcation without hemodynamically significant stenosis. VERTEBRAL ARTERIES: Left dominant configuration. Both origins are  clearly patent. There is no dissection, occlusion or flow-limiting stenosis to the skull base (V1-V3 segments). CTA HEAD FINDINGS POSTERIOR CIRCULATION: --Vertebral arteries: Normal V4 segments. --Inferior cerebellar arteries: Normal. --Basilar artery: Normal. --Superior cerebellar arteries: Normal. --Posterior cerebral arteries (PCA): Normal. ANTERIOR CIRCULATION: --Intracranial internal carotid arteries: Normal. --Anterior cerebral arteries (ACA): Normal. Both A1 segments are present. Patent anterior communicating artery (a-comm). --Middle cerebral arteries (MCA): Normal. VENOUS SINUSES: As permitted by contrast timing, patent. ANATOMIC VARIANTS: None Review of the MIP images confirms the above findings. IMPRESSION: 1. No emergent large vessel occlusion or high-grade stenosis of the intracranial arteries. 2. Mild bilateral carotid bifurcation atherosclerosis without hemodynamically significant stenosis by NASCET criteria. Aortic Atherosclerosis (ICD10-I70.0). Electronically Signed   By: Ulyses Jarred M.D.   On: 03/23/2021 19:38   CT ANGIO NECK CODE STROKE  Result Date: 03/23/2021 : CLINICAL DATA:   Expressive aphasia EXAM: CT ANGIOGRAPHY HEAD AND NECK TECHNIQUE: Multidetector CT imaging of the head and neck was performed using the standard protocol during bolus administration of intravenous contrast. Multiplanar CT image reconstructions and MIPs were obtained to evaluate the vascular anatomy. Carotid stenosis measurements (when applicable) are obtained utilizing NASCET criteria, using the distal internal carotid diameter as the denominator. CONTRAST:   3mL OMNIPAQUE IOHEXOL 350 MG/ML SOLN COMPARISON:   None. FINDINGS: CTA NECK FINDINGS SKELETON: There is no bony spinal canal stenosis. No lytic or blastic lesion. OTHER NECK: Normal pharynx, larynx and major salivary glands. No cervical lymphadenopathy. Unremarkable thyroid gland. UPPER CHEST: No pneumothorax or pleural effusion. No nodules or masses. AORTIC  ARCH: There is calcific atherosclerosis of the aortic arch. There is no aneurysm, dissection or hemodynamically significant stenosis of the visualized portion of the aorta. Conventional 3 vessel aortic branching pattern. The visualized proximal subclavian arteries are widely patent. RIGHT CAROTID SYSTEM: No dissection, occlusion or aneurysm. Mild atherosclerotic calcification at the carotid bifurcation without hemodynamically significant stenosis. LEFT CAROTID SYSTEM: No dissection, occlusion or aneurysm. Mild atherosclerotic calcification at the carotid bifurcation without hemodynamically significant stenosis. VERTEBRAL ARTERIES: Left dominant configuration. Both origins are clearly patent. There is no dissection, occlusion or flow-limiting stenosis to the skull base (V1-V3 segments). CTA HEAD FINDINGS POSTERIOR CIRCULATION: --Vertebral arteries: Normal V4 segments. --Inferior cerebellar arteries: Normal. --Basilar artery: Normal. --Superior cerebellar arteries: Normal. --Posterior cerebral arteries (PCA): Normal. ANTERIOR CIRCULATION: --Intracranial internal carotid arteries: Normal. --Anterior cerebral arteries (ACA): Normal. Both A1 segments are present. Patent anterior communicating artery (a-comm). --Middle cerebral arteries (MCA): Normal. VENOUS SINUSES: As permitted by contrast timing, patent. ANATOMIC VARIANTS: None Review of the MIP images confirms the above findings. IMPRESSION: 1. No emergent large vessel occlusion or high-grade stenosis of the intracranial arteries. 2. Mild bilateral carotid bifurcation atherosclerosis without hemodynamically significant stenosis by NASCET criteria. Aortic Atherosclerosis (ICD10-I70.0). Electronically Signed   By: Ulyses Jarred M.D.   On: 03/23/2021 19:45      PHYSICAL EXAM  Temp:  [98.3 F (36.8 C)-103 F (39.4 C)] 99 F (37.2 C) (06/18 1200) Pulse Rate:  [76-131] 96 (06/18 1200) Resp:  [15-27] 23 (06/18 1200) BP: (94-188)/(52-154) 129/70 (06/18  1200) SpO2:  [90 %-97 %] 95 % (06/18 1200) Weight:  [90 kg] 90 kg (06/18 0400)  General -obese elderly Caucasian lady.  Not in distress.  Ophthalmologic - fundi not visualized due to noncooperation.  Cardiovascular - irregularly irregular heart rate and rhythm with afib on tele  Neuro -drowsy but awakens with verbal stimulation , global aphasia but able to pantomime. Able to say "OK" and "yes" only to  every questions.  Speaks occasional words and short sentences are difficult to understand.  Not follow simple commands except mouth open, but able to pantomime. Left gaze preference, barely cross midline, tracking on the left, inconsistently blinking to visual threat on the left but not blinking to the right. Right mild facial droop. Tongue protrusion not cooperative. RUE flaccid with no withdrawal to pain and LUE no drift, no drift.  And antigravity movements spontaneously.  Bilaterally LEs withdraw to pain but left more than right.. Sensation and coordination not cooperative,   Gait not tested   ASSESSMENT/PLAN Ms. Danielle Payne is a 75 y.o. female with history of A. fib off AC, CHF, CAD, hypertension, hyperlipidemia, CAD status post CABG admitted for global aphasia.  tPA given.  Stroke:  left MCA infarct status post tPA, embolic, secondary to A. fib not on AC Resultant global aphasia with right hemiparesis CT no acute abnormality CTA head and neck no LVO MRI pending 2D Echo EF 60 to 65% LDL 80.9 HgbA1c pending SCDs for VTE prophylaxis  No antithrombotic prior to admission, now on No antithrombotic within 24 hours of tPA due to hemorrhagic transformation Ongoing aggressive stroke risk factor management Therapy recommendations: Pending Disposition: Pending  Chronic A. fib with RVR Follows with Dr. Oval Linsey on 01/28/20 Home meds including Coreg and Cardizem Metoprolol IV as needed Will restart home meds once po access Cardiology consulted for assistance Was on Coumadin before but  taken off by patient self "concern that it was stiffening her arteries" "unwilling to take Eliquis or Xarelto because she does not know "what is in them."   taking a natural blood thinner sold over-the-counter upon the recommendation of her herbalist. Discussed with daughter at bedside, she agreed with DOAC once appropriate  Hypertension Unstable, high On Cleviprex Resume Coreg and Cardizem once p.o. access Long term BP goal normotensive  Hyperlipidemia Home meds: None  LDL 80.9, goal < 70 Consider statin once p.o. access Continue statin at discharge  Dysphagia Did not pass swallow Pending MBS Speech following 1 choking event this morning Will put cortrak and start tube feeding  Other Stroke Risk Factors Advanced age Obesity, Body mass index is 35.15 kg/m.  Coronary artery disease status post CABG CHF  Other Active Problems TG 1219, likely contamination from [facility] day # 2 Plan start hypertonic saline drip through peripheral IV at 75 cc an hour with serum sodium goal between 150-155.  Resume home medications through core track tube.  Keep systolic blood pressure goal 120-140.  Continue antibiotics for presumed aspiration pneumonia.  Critical care consult to help with medical management.  Long discussion with patient and husband at the bedside and with her daughter-in-law whom I spoke to over the phone about plan of care and answered questions. This patient is critically ill due to stroke status post tPA, chronic A. fib with RVR, dysphagia, hypertensive emergency, CHF and at significant risk of neurological worsening, death form recurrent stroke, hemorrhagic conversion, bleeding from tPA, heart failure, aspiration pneumonia, sepsis, seizure. This patient's care requires constant monitoring of vital signs, hemodynamics, respiratory and cardiac monitoring, review of multiple databases, neurological assessment, discussion with family, other specialists and medical  decision making of high complexity. I spent 50 minutes of neurocritical care time in the care of this patient. I had long discussion with daughter and son at bedside, updated pt current condition, treatment plan and potential prognosis, and answered all the questions.  They expressed understanding and appreciation.   Antony Contras, MD Stroke  Neurology 03/25/2021 1:35 PM    To contact Stroke Continuity provider, please refer to [email or web address] After hours, contact General Neurology

## 2021-03-25 NOTE — Progress Notes (Signed)
PT Cancellation Note  Patient Details Name: Danielle Payne MRN: [id number] DOB: January 14, 1946   Cancelled Treatment:    Reason Eval/Treat Not Completed: Active bedrest order remains this morning. PT will continue to follow and evaluate as appropriate.   [name], DPT   Acute Rehabilitation Department Pager #: [phone]   [name] 03/25/2021, 7:39 AM

## 2021-03-25 NOTE — Progress Notes (Signed)
DAILY PROGRESS NOTE   Patient Name: Danielle Payne Date of Encounter: 03/25/2021 Cardiologist: Skeet Latch, MD  Chief Complaint   Severe expressive aphasia  Patient Profile   Danielle Payne is a 75 y.o. female with a hx of CAD s/p CABG 2017(LIMA-->LAD, SVG-->OM, SVG-->PDA), chronic systolic and diastolic heart failure (systolic dysfunction resolved), hypertension, persistent atrial fibrillation, diabetes, and asthma, who is being seen 03/24/2021 for the evaluation of atrial fib at the request of Dr Theda Sers.   Subjective   No issues noted overnight - HR control is reasonable on current regimen.  Objective   Vitals:   03/25/21 0800 03/25/21 0900 03/25/21 1000 03/25/21 1100  BP:   (!) 121/55 122/60  Pulse:   81 91  Resp:   19 (!) 21  Temp: 98.3 F (36.8 C)     TempSrc: Axillary     SpO2:   92% 97%  Weight:      Height:  5\' 3"  (1.6 m)      Intake/Output Summary (Last 24 hours) at 03/25/2021 1224 Last data filed at 03/25/2021 1100 Gross per 24 hour  Intake 1896.51 ml  Output 975 ml  Net 921.51 ml   Filed Weights   03/23/21 1800 03/25/21 0400  Weight: 92.4 kg 90 kg    Physical Exam   General appearance: alert, no distress, and restless, aphasic Neck: no carotid bruit, no JVD, and thyroid not enlarged, symmetric, no tenderness/mass/nodules Lungs: clear to auscultation bilaterally Heart: irregularly irregular rhythm Abdomen: soft, non-tender; bowel sounds normal; no masses,  no organomegaly Extremities: extremities normal, atraumatic, no cyanosis or edema Pulses: 2+ and symmetric Skin: diffuse erythema Neurologic: Mental status: awake, significant expressive aphasia, seems confused, agitated Psych: Cannot assess  Inpatient Medications    Scheduled Meds:  carvedilol  25 mg Per Tube BID WC   Chlorhexidine Gluconate Cloth  6 each Topical Daily   diltiazem  60 mg Per Tube Q8H   feeding supplement (PROSource TF)  45 mL Per Tube Daily   insulin aspart  0-9  Units Subcutaneous Q4H   insulin glargine  10 Units Subcutaneous BID   lisinopril  20 mg Per Tube Daily   mouth rinse  15 mL Mouth Rinse BID   melatonin  5 mg Per Tube QHS   pantoprazole sodium  40 mg Per Tube Daily   senna-docusate  1 tablet Per Tube BID    Continuous Infusions:  ampicillin-sulbactam (UNASYN) IV 3 g (03/25/21 1056)   feeding supplement (JEVITY 1.2 CAL) 1,000 mL (03/24/21 1424)   niCARDipine 5 mg/hr (03/25/21 1135)   potassium chloride 10 mEq (03/25/21 1130)   sodium chloride (hypertonic) 75 mL/hr at 03/25/21 1027    PRN Meds: acetaminophen **OR** acetaminophen (TYLENOL) oral liquid 160 mg/5 mL **OR** acetaminophen, albuterol, metoprolol tartrate   Labs   Results for orders placed or performed during the hospital encounter of 03/23/21 (from the past 48 hour(s))  Ethanol     Status: None   Collection Time: 03/23/21  6:47 PM  Result Value Ref Range   Alcohol, Ethyl (B) <10 <10 mg/dL    Comment: (NOTE) Lowest detectable limit for serum alcohol is 10 mg/dL.  For medical purposes only. Performed at [facility], [place] [address] [place], [place] [address]   Protime-INR     Status: None   Collection Time: 03/23/21  6:47 PM  Result Value Ref Range   Prothrombin Time 14.4 11.4 - 15.2 seconds   INR 1.1 0.8 - 1.2  Comment: (NOTE) INR goal varies based on device and disease states. Performed at [facility], [place] [address], [place], [place] [address]   APTT     Status: None   Collection Time: 03/23/21  6:47 PM  Result Value Ref Range   aPTT 27 24 - 36 seconds    Comment: Performed at [facility] [address] [place], [place] [address]  CBC     Status: Abnormal   Collection Time: 03/23/21  6:47 PM  Result Value Ref Range   WBC 8.8 4.0 - 10.5 K/uL   RBC 5.00 3.87 - 5.11 MIL/uL   Hemoglobin 15.1 (H) 12.0 - 15.0 g/dL   HCT 46.0 36.0 - 46.0 %   MCV 92.0 80.0 - 100.0 fL   MCH 30.2 26.0 - 34.0 pg   MCHC 32.8 30.0 - 36.0 g/dL   RDW 12.5  11.5 - 15.5 %   Platelets 233 150 - 400 K/uL   nRBC 0.0 0.0 - 0.2 %    Comment: Performed at [facility], [place] [address] [place], [place] [address]  Differential     Status: None   Collection Time: 03/23/21  6:47 PM  Result Value Ref Range   Neutrophils Relative % 55 %   Neutro Abs 4.8 1.7 - 7.7 K/uL   Lymphocytes Relative 33 %   Lymphs Abs 2.9 0.7 - 4.0 K/uL   Monocytes Relative 9 %   Monocytes Absolute 0.8 0.1 - 1.0 K/uL   Eosinophils Relative 2 %   Eosinophils Absolute 0.2 0.0 - 0.5 K/uL   Basophils Relative 1 %   Basophils Absolute 0.1 0.0 - 0.1 K/uL   Immature Granulocytes 0 %   Abs Immature Granulocytes 0.03 0.00 - 0.07 K/uL    Comment: Performed at [facility] [address] [place], [place] [address]  Comprehensive metabolic panel     Status: Abnormal   Collection Time: 03/23/21  6:47 PM  Result Value Ref Range   Sodium 135 135 - 145 mmol/L   Potassium 3.7 3.5 - 5.1 mmol/L   Chloride 101 98 - 111 mmol/L   CO2 23 22 - 32 mmol/L   Glucose, Bld 217 (H) 70 - 99 mg/dL    Comment: Glucose reference range applies only to samples taken after fasting for at least 8 hours.   BUN 16 8 - 23 mg/dL   Creatinine, Ser 0.90 0.44 - 1.00 mg/dL   Calcium 9.2 8.9 - 10.3 mg/dL   Total Protein 7.4 6.5 - 8.1 g/dL   Albumin 3.8 3.5 - 5.0 g/dL   AST 24 15 - 41 U/L   ALT 23 0 - 44 U/L   Alkaline Phosphatase 88 38 - 126 U/L   Total Bilirubin 0.6 0.3 - 1.2 mg/dL   GFR, Estimated >60 >60 mL/min    Comment: (NOTE) Calculated using the CKD-EPI Creatinine Equation (2021)    Anion gap 11 5 - 15    Comment: Performed at [facility] [address], [place], [place] [address]  CBG monitoring, ED     Status: Abnormal   Collection Time: 03/23/21  6:47 PM  Result Value Ref Range   Glucose-Capillary 217 (H) 70 - 99 mg/dL    Comment: Glucose reference range applies only to samples taken after fasting for at least 8 hours.  I-stat chem 8, ED     Status: Abnormal   Collection  Time: 03/23/21  6:58 PM  Result Value  Ref Range   Sodium 137 135 - 145 mmol/L   Potassium 3.8 3.5 - 5.1 mmol/L   Chloride 103 98 - 111 mmol/L   BUN 21 8 - 23 mg/dL   Creatinine, Ser 0.80 0.44 - 1.00 mg/dL   Glucose, Bld 214 (H) 70 - 99 mg/dL    Comment: Glucose reference range applies only to samples taken after fasting for at least 8 hours.   Calcium, Ion 1.13 (L) 1.15 - 1.40 mmol/L   TCO2 27 22 - 32 mmol/L   Hemoglobin 15.6 (H) 12.0 - 15.0 g/dL   HCT 46.0 36.0 - 46.0 %  Resp Panel by RT-PCR (Flu A&B, Covid) Nasopharyngeal Swab     Status: None   Collection Time: 03/23/21  8:20 PM   Specimen: Nasopharyngeal Swab; Nasopharyngeal(NP) swabs in vial transport medium  Result Value Ref Range   SARS Coronavirus 2 by RT PCR NEGATIVE NEGATIVE    Comment: (NOTE) SARS-CoV-2 target nucleic acids are NOT DETECTED.  The SARS-CoV-2 RNA is generally detectable in upper respiratory specimens during the acute phase of infection. The lowest concentration of SARS-CoV-2 viral copies this assay can detect is 138 copies/mL. A negative result does not preclude SARS-Cov-2 infection and should not be used as the sole basis for treatment or other patient management decisions. A negative result may occur with  improper specimen collection/handling, submission of specimen other than nasopharyngeal swab, presence of viral mutation(s) within the areas targeted by this assay, and inadequate number of viral copies(<138 copies/mL). A negative result must be combined with clinical observations, patient history, and epidemiological information. The expected result is Negative.  Fact Sheet for Patients:  EntrepreneurPulse.com.au  Fact Sheet for Healthcare Providers:  IncredibleEmployment.be  This test is no t yet approved or cleared by the [place] FDA and  has been authorized for detection and/or diagnosis of SARS-CoV-2 by FDA under an Emergency Use Authorization (EUA).  This EUA will remain  in effect (meaning this test can be used) for the duration of the COVID-19 declaration under Section 564(b)(1) of the Act, 21 U.S.C.section 360bbb-3(b)(1), unless the authorization is terminated  or revoked sooner.       Influenza A by PCR NEGATIVE NEGATIVE   Influenza B by PCR NEGATIVE NEGATIVE    Comment: (NOTE) The Xpert Xpress SARS-CoV-2/FLU/RSV plus assay is intended as an aid in the diagnosis of influenza from Nasopharyngeal swab specimens and should not be used as a sole basis for treatment. Nasal washings and aspirates are unacceptable for Xpert Xpress SARS-CoV-2/FLU/RSV testing.  Fact Sheet for Patients: EntrepreneurPulse.com.au  Fact Sheet for Healthcare Providers: IncredibleEmployment.be  This test is not yet approved or cleared by the [place] FDA and has been authorized for detection and/or diagnosis of SARS-CoV-2 by FDA under an Emergency Use Authorization (EUA). This EUA will remain in effect (meaning this test can be used) for the duration of the COVID-19 declaration under Section 564(b)(1) of the Act, 21 U.S.C. section 360bbb-3(b)(1), unless the authorization is terminated or revoked.  Performed at [facility], [place] [address] [place], [place] [address]   MRSA PCR Screening     Status: None   Collection Time: 03/23/21  9:33 PM  Result Value Ref Range   MRSA by PCR NEGATIVE NEGATIVE    Comment:        The GeneXpert MRSA Assay (FDA approved for NASAL specimens only), is one component of a comprehensive MRSA colonization surveillance program. It is not intended to diagnose MRSA infection nor to  guide or monitor treatment for MRSA infections. Performed at [facility], [place] [address], [place], [place] [address]   Urine rapid drug screen (hosp performed)     Status: None   Collection Time: 03/24/21  1:26 AM  Result Value Ref Range   Opiates NONE DETECTED NONE DETECTED   Cocaine  NONE DETECTED NONE DETECTED   Benzodiazepines NONE DETECTED NONE DETECTED   Amphetamines NONE DETECTED NONE DETECTED   Tetrahydrocannabinol NONE DETECTED NONE DETECTED   Barbiturates NONE DETECTED NONE DETECTED    Comment: (NOTE) DRUG SCREEN FOR MEDICAL PURPOSES ONLY.  IF CONFIRMATION IS NEEDED FOR ANY PURPOSE, NOTIFY LAB WITHIN 5 DAYS.  LOWEST DETECTABLE LIMITS FOR URINE DRUG SCREEN Drug Class                     Cutoff (ng/mL) Amphetamine and metabolites    1000 Barbiturate and metabolites    200 Benzodiazepine                 956 Tricyclics and metabolites     300 Opiates and metabolites        300 Cocaine and metabolites        300 THC                            50 Performed at [facility], [place] [address] [place], [place] [address]   Urinalysis, Routine w reflex microscopic Urine, Clean Catch     Status: Abnormal   Collection Time: 03/24/21  1:26 AM  Result Value Ref Range   Color, Urine YELLOW YELLOW   APPearance CLOUDY (A) CLEAR   Specific Gravity, Urine 1.020 1.005 - 1.030   pH 7.0 5.0 - 8.0   Glucose, UA 150 (A) NEGATIVE mg/dL   Hgb urine dipstick SMALL (A) NEGATIVE   Bilirubin Urine NEGATIVE NEGATIVE   Ketones, ur 5 (A) NEGATIVE mg/dL   Protein, ur 30 (A) NEGATIVE mg/dL   Nitrite NEGATIVE NEGATIVE   Leukocytes,Ua NEGATIVE NEGATIVE   RBC / HPF 0-5 0 - 5 RBC/hpf   WBC, UA 0-5 0 - 5 WBC/hpf   Bacteria, UA NONE SEEN NONE SEEN   Squamous Epithelial / LPF 0-5 0 - 5   Mucus PRESENT     Comment: Performed at [facility], [place] [address] [place], [place] [address]  Hemoglobin A1c     Status: Abnormal   Collection Time: 03/24/21  5:10 AM  Result Value Ref Range   Hgb A1c MFr Bld 8.8 (H) 4.8 - 5.6 %    Comment: (NOTE)         Prediabetes: 5.7 - 6.4         Diabetes: >6.4         Glycemic control for adults with diabetes: <7.0    Mean Plasma Glucose 206 mg/dL    Comment: (NOTE) Performed At: [facility] [place], [place]  [id number] Rush Farmer MD [id number]   Lipid panel     Status: Abnormal   Collection Time: 03/24/21  5:10 AM  Result Value Ref Range   Cholesterol 126 0 - 200 mg/dL   Triglycerides 1,219 (H) <150 mg/dL    Comment: RESULTS CONFIRMED BY MANUAL DILUTION   HDL 27 (L) >40 mg/dL   Total CHOL/HDL Ratio NOT REPORTED DUE TO HIGH TRIGLYCERIDES RATIO   VLDL UNABLE TO CALCULATE IF TRIGLYCERIDE OVER 400 mg/dL 0 - 40 mg/dL  LDL Cholesterol UNABLE TO CALCULATE IF TRIGLYCERIDE OVER 400 mg/dL 0 - 99 mg/dL    Comment:        Total Cholesterol/HDL:CHD Risk Coronary Heart Disease Risk Table                     Men   Women  1/2 Average Risk   3.4   3.3  Average Risk       5.0   4.4  2 X Average Risk   9.6   7.1  3 X Average Risk  23.4   11.0        Use the calculated Patient Ratio above and the CHD Risk Table to determine the patient's CHD Risk.        ATP III CLASSIFICATION (LDL):  <100     mg/dL   Optimal  100-129  mg/dL   Near or Above                    Optimal  130-159  mg/dL   Borderline  160-189  mg/dL   High  >190     mg/dL   Very High Performed at [facility] [address] [place], [place] [address]   LDL cholesterol, direct     Status: None   Collection Time: 03/24/21  5:10 AM  Result Value Ref Range   Direct LDL 80.9 0 - 99 mg/dL    Comment: Performed at [facility] [address], [place], [place] [address]  Glucose, capillary     Status: Abnormal   Collection Time: 03/24/21  3:29 PM  Result Value Ref Range   Glucose-Capillary 376 (H) 70 - 99 mg/dL    Comment: Glucose reference range applies only to samples taken after fasting for at least 8 hours.  Magnesium     Status: None   Collection Time: 03/24/21  4:50 PM  Result Value Ref Range   Magnesium 1.8 1.7 - 2.4 mg/dL    Comment: Performed at [facility] [address], [place], [place] [address]  Phosphorus     Status: None   Collection Time: 03/24/21  4:50 PM  Result Value Ref Range    Phosphorus 2.5 2.5 - 4.6 mg/dL    Comment: Performed at [facility] [address], [place], [place] [address]  Glucose, capillary     Status: Abnormal   Collection Time: 03/24/21  7:22 PM  Result Value Ref Range   Glucose-Capillary 308 (H) 70 - 99 mg/dL    Comment: Glucose reference range applies only to samples taken after fasting for at least 8 hours.  Glucose, capillary     Status: Abnormal   Collection Time: 03/24/21 11:14 PM  Result Value Ref Range   Glucose-Capillary 309 (H) 70 - 99 mg/dL    Comment: Glucose reference range applies only to samples taken after fasting for at least 8 hours.  Glucose, capillary     Status: Abnormal   Collection Time: 03/25/21  3:15 AM  Result Value Ref Range   Glucose-Capillary 219 (H) 70 - 99 mg/dL    Comment: Glucose reference range applies only to samples taken after fasting for at least 8 hours.  Magnesium     Status: None   Collection Time: 03/25/21  6:10 AM  Result Value Ref Range   Magnesium 2.1 1.7 - 2.4 mg/dL    Comment: Performed at [facility] [address] [place], [place]  [address]  Phosphorus     Status: None   Collection Time: 03/25/21  6:10 AM  Result Value Ref Range   Phosphorus 3.0 2.5 - 4.6 mg/dL    Comment: Performed at [facility] [address], [place], [place] [address]  CBC     Status: Abnormal   Collection Time: 03/25/21  6:10 AM  Result Value Ref Range   WBC 17.0 (H) 4.0 - 10.5 K/uL   RBC 4.66 3.87 - 5.11 MIL/uL   Hemoglobin 13.8 12.0 - 15.0 g/dL   HCT 41.0 36.0 - 46.0 %   MCV 88.0 80.0 - 100.0 fL   MCH 29.6 26.0 - 34.0 pg   MCHC 33.7 30.0 - 36.0 g/dL   RDW 13.2 11.5 - 15.5 %   Platelets 238 150 - 400 K/uL   nRBC 0.0 0.0 - 0.2 %    Comment: Performed at [facility], [place] [address] [place], [place] [address]  Basic metabolic panel     Status: Abnormal   Collection Time: 03/25/21  6:10 AM  Result Value Ref Range   Sodium 135 135 - 145 mmol/L   Potassium 3.4 (L) 3.5 - 5.1  mmol/L   Chloride 101 98 - 111 mmol/L   CO2 23 22 - 32 mmol/L   Glucose, Bld 256 (H) 70 - 99 mg/dL    Comment: Glucose reference range applies only to samples taken after fasting for at least 8 hours.   BUN 34 (H) 8 - 23 mg/dL   Creatinine, Ser 1.28 (H) 0.44 - 1.00 mg/dL   Calcium 9.1 8.9 - 10.3 mg/dL   GFR, Estimated 43 (L) >60 mL/min    Comment: (NOTE) Calculated using the CKD-EPI Creatinine Equation (2021)    Anion gap 11 5 - 15    Comment: Performed at [facility] [address] [place], [place] [address]  Glucose, capillary     Status: Abnormal   Collection Time: 03/25/21  7:39 AM  Result Value Ref Range   Glucose-Capillary 323 (H) 70 - 99 mg/dL    Comment: Glucose reference range applies only to samples taken after fasting for at least 8 hours.  Sodium     Status: Abnormal   Collection Time: 03/25/21  9:21 AM  Result Value Ref Range   Sodium 132 (L) 135 - 145 mmol/L    Comment: Performed at [facility] [address] [place], [place] [address]  Glucose, capillary     Status: Abnormal   Collection Time: 03/25/21 11:43 AM  Result Value Ref Range   Glucose-Capillary 300 (H) 70 - 99 mg/dL    Comment: Glucose reference range applies only to samples taken after fasting for at least 8 hours.    ECG   N/A  Telemetry   AFib with CVR - Personally Reviewed  Radiology    CT HEAD WO CONTRAST  Result Date: 03/25/2021 CLINICAL DATA:  75 year old female code stroke presentation status post IV tPA. Left MCA territory infarct with mild hemorrhagic changes. EXAM: CT HEAD WITHOUT CONTRAST TECHNIQUE: Contiguous axial images were obtained from the base of the skull through the vertex without intravenous contrast. COMPARISON:  Head CT 03/24/2021 and earlier. FINDINGS: Brain: Mixed cytotoxic edema and rounded, nodular areas of hemorrhage redemonstrated in the left MCA territory. The several dominant foci of blood in the posterior left frontal and left parietal lobes  continuing toward the left occipital pole are stable and size and configuration since yesterday (e.g. Series  4, image 22). Partial effacement of the left lateral ventricle and 2-3 mm of rightward midline shift are stable. No intraventricular hemorrhage. Probable gyral petechial hemorrhage at the left temporal operculum is stable on series 4, image 18. No definite extra-axial hemorrhage. Right hemisphere and posterior fossa gray-white matter differentiation is stable and within normal limits. No ventriculomegaly. Basilar cisterns remain patent. Vascular: Calcified atherosclerosis at the skull base. Skull: No acute osseous abnormality identified. Sinuses/Orbits: Visualized paranasal sinuses and mastoids are stable and well aerated. Other: Stable right nasoenteric tube. Mild leftward gaze is stable. Visualized scalp soft tissues are within normal limits. IMPRESSION: 1. Stable Left MCA infarct with hemorrhagic transformation since yesterday. Extension to the Left MCA/PCA watershed territory as before. Partially effaced left lateral ventricle and trace rightward midline shift are stable. No extra-axial extension of blood. 2. No new areas of involvement or new intracranial abnormality. Electronically Signed   By: Genevie Ann M.D.   On: 03/25/2021 05:19   CT HEAD WO CONTRAST  Result Date: 03/24/2021 CLINICAL DATA:  Stroke.  24 hours post tPA EXAM: CT HEAD WITHOUT CONTRAST TECHNIQUE: Contiguous axial images were obtained from the base of the skull through the vertex without intravenous contrast. COMPARISON:  CT head 03/23/2021 FINDINGS: Brain: Interval development of left MCA infarct with hypodensity in the left frontal and parietal lobe. There is also involvement of the left occipital lobe. There is hemorrhagic transformation of the infarct involving the left temporal and parietal lobe with a moderate amount of blood present. Ventricle size normal.  No midline shift. Vascular: Negative for hyperdense vessel Skull: Negative  Sinuses/Orbits: Mucosal edema paranasal sinuses. Feeding tube in place. Air-fluid level sphenoid sinus. Negative orbit. Other: None IMPRESSION: Interval development of acute infarct in the left MCA territory with hypodensity in the left temporal and parietal lobe. This also extends into the left occipital lobe. There has been interval hemorrhagic transformation of the infarct with moderate amount of blood present within the infarction. Dr. Cheral Marker  has been paged to discuss the results. Electronically Signed   By: Franchot Gallo M.D.   On: 03/24/2021 18:30   DG CHEST PORT 1 VIEW  Result Date: 03/24/2021 CLINICAL DATA:  Fevers EXAM: PORTABLE CHEST 1 VIEW COMPARISON:  11/20/2018 FINDINGS: Cardiac shadow is enlarged. Postsurgical changes are again seen. Feeding catheter is noted extending into the stomach. Lungs are hypoinflated. Mild vascular congestion is noted without parenchymal edema. Left basilar opacity is noted which may represent early infiltrate. IMPRESSION: Mild CHF without pulmonary edema. Left basilar opacity which may represent an early infiltrate. Electronically Signed   By: Inez Catalina M.D.   On: 03/24/2021 17:54   DG Abd Portable 1V  Result Date: 03/24/2021 CLINICAL DATA:  Feeding tube placement EXAM: PORTABLE ABDOMEN - 1 VIEW COMPARISON:  Portable exam 1314 hours without priors for comparison FINDINGS: Tip of feeding tube projects over distal gastric antrum. Visualized bowel gas pattern normal. Atherosclerotic calcifications aorta and splenic artery. Prior median sternotomy and LEFT atrial appendage clipping. Subsegmental atelectasis at LEFT lung base. IMPRESSION: Tip of feeding tube projects over distal gastric antrum. Electronically Signed   By: Lavonia Dana M.D.   On: 03/24/2021 13:40   ECHOCARDIOGRAM COMPLETE  Result Date: 03/24/2021    ECHOCARDIOGRAM REPORT   Patient Name:   Danielle Payne Date of Exam: 03/24/2021 Medical Rec #:  [id number]       Height:       63.0 in Accession #:     [id number]  Weight:       203.7 lb Date of Birth:  11/22/1945      BSA:          1.949 m Patient Age:    50 years        BP:           169/83 mmHg Patient Gender: F               HR:           109 bpm. Exam Location:  Inpatient Procedure: 2D Echo Indications:    stroke  History:        Patient has prior history of Echocardiogram examinations, most                 recent 05/26/2018. CAD, Arrythmias:Atrial Fibrillation; Risk                 Factors:Diabetes and Hypertension.  Sonographer:    Johny Chess Referring Phys: [id number] HUNTER J COLLINS  Sonographer Comments: Suboptimal apical window. Image acquisition challenging due to uncooperative patient, Image acquisition challenging due to patient body habitus and restraints. kept moving. IMPRESSIONS  1. Left ventricular ejection fraction, by estimation, is 60 to 65%. The left ventricle has normal function. The left ventricle has no regional wall motion abnormalities. Left ventricular diastolic function could not be evaluated.  2. Right ventricular systolic function is normal. The right ventricular size is normal.  3. The mitral valve is grossly normal. No evidence of mitral valve regurgitation. No evidence of mitral stenosis.  4. The aortic valve is grossly normal. Aortic valve regurgitation is mild. No aortic stenosis is present. FINDINGS  Left Ventricle: Left ventricular ejection fraction, by estimation, is 60 to 65%. The left ventricle has normal function. The left ventricle has no regional wall motion abnormalities. The left ventricular internal cavity size was normal in size. There is  borderline left ventricular hypertrophy. Left ventricular diastolic function could not be evaluated due to atrial fibrillation. Left ventricular diastolic function could not be evaluated. Right Ventricle: The right ventricular size is normal. Right vetricular wall thickness was not well visualized. Right ventricular systolic function is normal. Left Atrium: Left atrial size  was normal in size. Right Atrium: Right atrial size was not well visualized. Pericardium: There is no evidence of pericardial effusion. Mitral Valve: The mitral valve is grossly normal. No evidence of mitral valve regurgitation. No evidence of mitral valve stenosis. Tricuspid Valve: The tricuspid valve is grossly normal. Tricuspid valve regurgitation is not demonstrated. Aortic Valve: The aortic valve is grossly normal. Aortic valve regurgitation is mild. No aortic stenosis is present. Pulmonic Valve: The pulmonic valve was grossly normal. Pulmonic valve regurgitation is not visualized. Aorta: The aortic root and ascending aorta are structurally normal, with no evidence of dilitation. IAS/Shunts: The atrial septum is grossly normal.  LEFT VENTRICLE PLAX 2D LVIDd:         4.60 cm LVIDs:         3.40 cm LV PW:         1.10 cm LV IVS:        1.30 cm LVOT diam:     1.90 cm LVOT Area:     2.84 cm  IVC IVC diam: 2.20 cm LEFT ATRIUM           Index LA diam:      4.40 cm 2.26 cm/m LA Vol (A4C): 60.2 ml 30.89 ml/m   AORTA Ao Root diam: 2.80 cm Ao Asc diam:  3.20 cm  SHUNTS Systemic Diam: 1.90 cm Mertie Moores MD Electronically signed by Mertie Moores MD Signature Date/Time: 03/24/2021/5:19:01 PM    Final    CT HEAD CODE STROKE WO CONTRAST  Result Date: 03/23/2021 CLINICAL DATA:  Code stroke.  Acute neuro deficit.  Aphasia EXAM: CT HEAD WITHOUT CONTRAST TECHNIQUE: Contiguous axial images were obtained from the base of the skull through the vertex without intravenous contrast. COMPARISON:  None. FINDINGS: Brain: Mild atrophy and mild white matter hypodensity bilaterally. Negative for acute infarct, hemorrhage, mass. Vascular: Negative for hyperdense vessel Skull: Negative Sinuses/Orbits: Mild mucosal edema paranasal sinuses. Negative orbit Other: None ASPECTS (Whitney Stroke Program Early CT Score) - Ganglionic level infarction (caudate, lentiform nuclei, internal capsule, insula, M1-M3 cortex): 7 - Supraganglionic  infarction (M4-M6 cortex): 3 Total score (0-10 with 10 being normal): 10 IMPRESSION: 1. No acute abnormality. 2. ASPECTS is 10 3. Code stroke imaging results were communicated on 03/23/2021 at 6:59 pm to provider [place] via text page Electronically Signed   By: Franchot Gallo M.D.   On: 03/23/2021 19:00   CT ANGIO HEAD CODE STROKE  Result Date: 03/23/2021 CLINICAL DATA:  Expressive aphasia EXAM: CT ANGIOGRAPHY HEAD AND NECK TECHNIQUE: Multidetector CT imaging of the head and neck was performed using the standard protocol during bolus administration of intravenous contrast. Multiplanar CT image reconstructions and MIPs were obtained to evaluate the vascular anatomy. Carotid stenosis measurements (when applicable) are obtained utilizing NASCET criteria, using the distal internal carotid diameter as the denominator. CONTRAST:  82mL OMNIPAQUE IOHEXOL 350 MG/ML SOLN COMPARISON:  None. FINDINGS: CTA NECK FINDINGS SKELETON: There is no bony spinal canal stenosis. No lytic or blastic lesion. OTHER NECK: Normal pharynx, larynx and major salivary glands. No cervical lymphadenopathy. Unremarkable thyroid gland. UPPER CHEST: No pneumothorax or pleural effusion. No nodules or masses. AORTIC ARCH: There is calcific atherosclerosis of the aortic arch. There is no aneurysm, dissection or hemodynamically significant stenosis of the visualized portion of the aorta. Conventional 3 vessel aortic branching pattern. The visualized proximal subclavian arteries are widely patent. RIGHT CAROTID SYSTEM: No dissection, occlusion or aneurysm. Mild atherosclerotic calcification at the carotid bifurcation without hemodynamically significant stenosis. LEFT CAROTID SYSTEM: No dissection, occlusion or aneurysm. Mild atherosclerotic calcification at the carotid bifurcation without hemodynamically significant stenosis. VERTEBRAL ARTERIES: Left dominant configuration. Both origins are clearly patent. There is no dissection, occlusion or  flow-limiting stenosis to the skull base (V1-V3 segments). CTA HEAD FINDINGS POSTERIOR CIRCULATION: --Vertebral arteries: Normal V4 segments. --Inferior cerebellar arteries: Normal. --Basilar artery: Normal. --Superior cerebellar arteries: Normal. --Posterior cerebral arteries (PCA): Normal. ANTERIOR CIRCULATION: --Intracranial internal carotid arteries: Normal. --Anterior cerebral arteries (ACA): Normal. Both A1 segments are present. Patent anterior communicating artery (a-comm). --Middle cerebral arteries (MCA): Normal. VENOUS SINUSES: As permitted by contrast timing, patent. ANATOMIC VARIANTS: None Review of the MIP images confirms the above findings. IMPRESSION: 1. No emergent large vessel occlusion or high-grade stenosis of the intracranial arteries. 2. Mild bilateral carotid bifurcation atherosclerosis without hemodynamically significant stenosis by NASCET criteria. Aortic Atherosclerosis (ICD10-I70.0). Electronically Signed   By: Ulyses Jarred M.D.   On: 03/23/2021 19:38   CT ANGIO NECK CODE STROKE  Result Date: 03/23/2021 : CLINICAL DATA:   Expressive aphasia EXAM: CT ANGIOGRAPHY HEAD AND NECK TECHNIQUE: Multidetector CT imaging of the head and neck was performed using the standard protocol during bolus administration of intravenous contrast. Multiplanar CT image reconstructions and MIPs were obtained to evaluate the vascular anatomy. Carotid stenosis measurements (when applicable) are obtained utilizing NASCET  criteria, using the distal internal carotid diameter as the denominator. CONTRAST:   32mL OMNIPAQUE IOHEXOL 350 MG/ML SOLN COMPARISON:   None. FINDINGS: CTA NECK FINDINGS SKELETON: There is no bony spinal canal stenosis. No lytic or blastic lesion. OTHER NECK: Normal pharynx, larynx and major salivary glands. No cervical lymphadenopathy. Unremarkable thyroid gland. UPPER CHEST: No pneumothorax or pleural effusion. No nodules or masses. AORTIC ARCH: There is calcific atherosclerosis of the aortic  arch. There is no aneurysm, dissection or hemodynamically significant stenosis of the visualized portion of the aorta. Conventional 3 vessel aortic branching pattern. The visualized proximal subclavian arteries are widely patent. RIGHT CAROTID SYSTEM: No dissection, occlusion or aneurysm. Mild atherosclerotic calcification at the carotid bifurcation without hemodynamically significant stenosis. LEFT CAROTID SYSTEM: No dissection, occlusion or aneurysm. Mild atherosclerotic calcification at the carotid bifurcation without hemodynamically significant stenosis. VERTEBRAL ARTERIES: Left dominant configuration. Both origins are clearly patent. There is no dissection, occlusion or flow-limiting stenosis to the skull base (V1-V3 segments). CTA HEAD FINDINGS POSTERIOR CIRCULATION: --Vertebral arteries: Normal V4 segments. --Inferior cerebellar arteries: Normal. --Basilar artery: Normal. --Superior cerebellar arteries: Normal. --Posterior cerebral arteries (PCA): Normal. ANTERIOR CIRCULATION: --Intracranial internal carotid arteries: Normal. --Anterior cerebral arteries (ACA): Normal. Both A1 segments are present. Patent anterior communicating artery (a-comm). --Middle cerebral arteries (MCA): Normal. VENOUS SINUSES: As permitted by contrast timing, patent. ANATOMIC VARIANTS: None Review of the MIP images confirms the above findings. IMPRESSION: 1. No emergent large vessel occlusion or high-grade stenosis of the intracranial arteries. 2. Mild bilateral carotid bifurcation atherosclerosis without hemodynamically significant stenosis by NASCET criteria. Aortic Atherosclerosis (ICD10-I70.0). Electronically Signed   By: Ulyses Jarred M.D.   On: 03/23/2021 19:45    Cardiac Studies   See   Assessment   Active Problems:   Atrial fibrillation (HCC)   Stroke (cerebrum) (HCC)   Plan   Continue current rate control strategy on coreg 25 mg BID per tube - ultimately, need to consider if she would be willing to take [facility]  long-term as she has previously refused this. Currently at risk for hemorrhagic conversion after stroke - so no OAC. Nicardipine gtts and hypertonic saline also noted today.  Time Spent Directly with Patient:  I have spent a total of 25 minutes with the patient reviewing hospital notes, telemetry, EKGs, labs and examining the patient as well as establishing an assessment and plan that was discussed personally with the patient.  > 50% of time was spent in direct patient care.  Length of Stay:  LOS: 2 days   Pixie Casino, MD, [facility] [place], [place] Director of the [facility] Diplomate of the American Board of Clinical Lipidology Attending Cardiologist  Direct Dial: [phone]  Fax: [phone]  Website:  [email or web address] 03/25/2021, 12:24 PM

## 2021-03-26 ENCOUNTER — Inpatient Hospital Stay (HOSPITAL_COMMUNITY): Payer: Medicare Other

## 2021-03-26 DIAGNOSIS — R4182 Altered mental status, unspecified: Secondary | ICD-10-CM

## 2021-03-26 LAB — BASIC METABOLIC PANEL
Anion gap: 6 (ref 5–15)
BUN: 38 mg/dL — ABNORMAL HIGH (ref 8–23)
CO2: 22 mmol/L (ref 22–32)
Calcium: 8.4 mg/dL — ABNORMAL LOW (ref 8.9–10.3)
Chloride: 120 mmol/L — ABNORMAL HIGH (ref 98–111)
Creatinine, Ser: 1.02 mg/dL — ABNORMAL HIGH (ref 0.44–1.00)
GFR, Estimated: 57 mL/min — ABNORMAL LOW (ref >60)
Glucose, Bld: 204 mg/dL — ABNORMAL HIGH (ref 70–99)
Potassium: 3.7 mmol/L (ref 3.5–5.1)
Sodium: 148 mmol/L — ABNORMAL HIGH (ref 135–145)

## 2021-03-26 LAB — POCT I-STAT 7, (LYTES, BLD GAS, ICA,H+H)
Acid-base deficit: 4 mmol/L — ABNORMAL HIGH (ref 0.0–2.0)
Bicarbonate: 19 mmol/L — ABNORMAL LOW (ref 20.0–28.0)
Calcium, Ion: 1.19 mmol/L (ref 1.15–1.40)
HCT: 34 % — ABNORMAL LOW (ref 36.0–46.0)
Hemoglobin: 11.6 g/dL — ABNORMAL LOW (ref 12.0–15.0)
O2 Saturation: 95 %
Patient temperature: 99.7
Potassium: 3.9 mmol/L (ref 3.5–5.1)
Sodium: 158 mmol/L — ABNORMAL HIGH (ref 135–145)
TCO2: 20 mmol/L — ABNORMAL LOW (ref 22–32)
pCO2 arterial: 28.8 mmHg — ABNORMAL LOW (ref 32.0–48.0)
pH, Arterial: 7.43 (ref 7.350–7.450)
pO2, Arterial: 74 mmHg — ABNORMAL LOW (ref 83.0–108.0)

## 2021-03-26 LAB — SODIUM
Sodium: 148 mmol/L — ABNORMAL HIGH (ref 135–145)
Sodium: 150 mmol/L — ABNORMAL HIGH (ref 135–145)
Sodium: 151 mmol/L — ABNORMAL HIGH (ref 135–145)
Sodium: 157 mmol/L — ABNORMAL HIGH (ref 135–145)

## 2021-03-26 LAB — GLUCOSE, CAPILLARY
Glucose-Capillary: 144 mg/dL — ABNORMAL HIGH (ref 70–99)
Glucose-Capillary: 161 mg/dL — ABNORMAL HIGH (ref 70–99)
Glucose-Capillary: 173 mg/dL — ABNORMAL HIGH (ref 70–99)
Glucose-Capillary: 233 mg/dL — ABNORMAL HIGH (ref 70–99)
Glucose-Capillary: 237 mg/dL — ABNORMAL HIGH (ref 70–99)
Glucose-Capillary: 306 mg/dL — ABNORMAL HIGH (ref 70–99)

## 2021-03-26 LAB — CBC
HCT: 37.9 % (ref 36.0–46.0)
Hemoglobin: 12.6 g/dL (ref 12.0–15.0)
MCH: 30.1 pg (ref 26.0–34.0)
MCHC: 33.2 g/dL (ref 30.0–36.0)
MCV: 90.7 fL (ref 80.0–100.0)
Platelets: 181 10*3/uL (ref 150–400)
RBC: 4.18 MIL/uL (ref 3.87–5.11)
RDW: 13.4 % (ref 11.5–15.5)
WBC: 13.1 10*3/uL — ABNORMAL HIGH (ref 4.0–10.5)
nRBC: 0 % (ref 0.0–0.2)

## 2021-03-26 LAB — PHOSPHORUS: Phosphorus: 3.2 mg/dL (ref 2.5–4.6)

## 2021-03-26 LAB — MAGNESIUM: Magnesium: 2.2 mg/dL (ref 1.7–2.4)

## 2021-03-26 MED ORDER — DILTIAZEM 12 MG/ML ORAL SUSPENSION
60.0000 mg | Freq: Four times a day (QID) | ORAL | Status: DC
  Administered 2021-03-26 – 2021-04-01 (×24): 60 mg
  Filled 2021-03-26 (×27): qty 6

## 2021-03-26 MED ORDER — METHYLPREDNISOLONE SODIUM SUCC 40 MG IJ SOLR
40.0000 mg | INTRAMUSCULAR | Status: AC
  Administered 2021-03-26: 40 mg via INTRAVENOUS
  Filled 2021-03-26: qty 1

## 2021-03-26 MED ORDER — ALBUTEROL SULFATE (2.5 MG/3ML) 0.083% IN NEBU
2.5000 mg | INHALATION_SOLUTION | RESPIRATORY_TRACT | Status: DC | PRN
  Administered 2021-03-26 – 2021-03-28 (×4): 2.5 mg via RESPIRATORY_TRACT
  Filled 2021-03-26 (×4): qty 3

## 2021-03-26 MED ORDER — HALOPERIDOL LACTATE 5 MG/ML IJ SOLN
1.0000 mg | Freq: Four times a day (QID) | INTRAMUSCULAR | Status: DC | PRN
  Administered 2021-03-26 – 2021-03-30 (×3): 1 mg via INTRAVENOUS
  Filled 2021-03-26 (×4): qty 1

## 2021-03-26 MED ORDER — MAGNESIUM SULFATE 2 GM/50ML IV SOLN
2.0000 g | Freq: Once | INTRAVENOUS | Status: AC
  Administered 2021-03-26: 2 g via INTRAVENOUS
  Filled 2021-03-26: qty 50

## 2021-03-26 MED ORDER — FUROSEMIDE 10 MG/ML IJ SOLN
20.0000 mg | Freq: Once | INTRAMUSCULAR | Status: AC
  Administered 2021-03-26: 20 mg via INTRAVENOUS
  Filled 2021-03-26: qty 2

## 2021-03-26 NOTE — Progress Notes (Signed)
eLink Physician-Brief Progress Note Patient Name: Danielle Payne DOB: [birth year] MRN: [id number]   Date of Service  03/26/2021  HPI/Events of Note  Notified of oliguria Receiving 3% saline for cerebral edema Fluid positive 4 liters since presentation  eICU Interventions  Consider starting feeds in am if fails swallow eval     Intervention Category Intermediate Interventions: Oliguria - evaluation and management  Judd Lien 03/26/2021, 2:26 AM

## 2021-03-26 NOTE — Progress Notes (Addendum)
STROKE TEAM PROGRESS NOTE   SUBJECTIVE (INTERVAL HISTORY) Patient is neurologically improved today and more alert resting in bed with no family at bedside.   Continues to have global aphasia, mild right facial droop and right arm weakness.   She has a core track tube in place for enteral nutrition, CCM is assisting with management given she was started on HTS for cerebral edema on 03/25/21  Are continuing HTS at this time.  Serum sodium is 148 this morning.  If MRI can not be obtained today will repeat [facility] tomorrow AM to evaluate cerebral edema   OBJECTIVE Temp:  [97.9 F (36.6 C)-100.2 F (37.9 C)] 98.5 F (36.9 C) (06/19 0800) Pulse Rate:  [60-213] 67 (06/19 0900) Cardiac Rhythm: Atrial fibrillation (06/19 0800) Resp:  [16-27] 20 (06/19 0900) BP: (110-165)/(46-146) 127/62 (06/19 0900) SpO2:  [83 %-100 %] 95 % (06/19 0900)  Recent Labs  Lab 03/25/21 1509 03/25/21 1948 03/25/21 2333 03/26/21 0428 03/26/21 0818  GLUCAP 266* 239* 223* 173* 144*   Recent Labs  Lab 03/23/21 1847 03/23/21 1858 03/24/21 1650 03/25/21 0610 03/25/21 0921 03/25/21 1627 03/25/21 2022 03/26/21 0300 03/26/21 0907  NA 135 137  --  135 132* 137 142 148*  148* 150*  K 3.7 3.8  --  3.4*  --   --   --  3.7  --   CL 101 103  --  101  --   --   --  120*  --   CO2 23  --   --  23  --   --   --  22  --   GLUCOSE 217* 214*  --  256*  --   --   --  204*  --   BUN 16 21  --  34*  --   --   --  38*  --   CREATININE 0.90 0.80  --  1.28*  --   --   --  1.02*  --   CALCIUM 9.2  --   --  9.1  --   --   --  8.4*  --   MG  --   --  1.8 2.1  --  2.1  --  2.2  --   PHOS  --   --  2.5 3.0  --  3.5  --  3.2  --    Recent Labs  Lab 03/23/21 1847  AST 24  ALT 23  ALKPHOS 88  BILITOT 0.6  PROT 7.4  ALBUMIN 3.8   Recent Labs  Lab 03/23/21 1847 03/23/21 1858 03/25/21 0610 03/26/21 0300  WBC 8.8  --  17.0* 13.1*  NEUTROABS 4.8  --   --   --   HGB 15.1* 15.6* 13.8 12.6  HCT 46.0 46.0 41.0 37.9  MCV  92.0  --  88.0 90.7  PLT 233  --  238 181   No results for input(s): CKTOTAL, CKMB, CKMBINDEX, TROPONINI in the last 168 hours. Recent Labs    03/23/21 1847  LABPROT 14.4  INR 1.1   Recent Labs    03/24/21 0126  COLORURINE YELLOW  LABSPEC 1.020  PHURINE 7.0  GLUCOSEU 150*  HGBUR SMALL*  BILIRUBINUR NEGATIVE  KETONESUR 5*  PROTEINUR 30*  NITRITE NEGATIVE  LEUKOCYTESUR NEGATIVE       Component Value Date/Time   CHOL 126 03/24/2021 0510   CHOL 191 11/05/2019 0902   TRIG 1,219 (H) 03/24/2021 0510   HDL 27 (L) 03/24/2021 0510   HDL 39 (  L) 11/05/2019 0902   CHOLHDL NOT REPORTED DUE TO HIGH TRIGLYCERIDES 03/24/2021 0510   VLDL UNABLE TO CALCULATE IF TRIGLYCERIDE OVER 400 mg/dL 03/24/2021 0510   LDLCALC UNABLE TO CALCULATE IF TRIGLYCERIDE OVER 400 mg/dL 03/24/2021 0510   LDLCALC 119 (H) 11/05/2019 0902   Lab Results  Component Value Date   HGBA1C 8.8 (H) 03/24/2021      Component Value Date/Time   LABOPIA NONE DETECTED 03/24/2021 0126   COCAINSCRNUR NONE DETECTED 03/24/2021 0126   LABBENZ NONE DETECTED 03/24/2021 0126   AMPHETMU NONE DETECTED 03/24/2021 0126   THCU NONE DETECTED 03/24/2021 0126   LABBARB NONE DETECTED 03/24/2021 0126    Recent Labs  Lab 03/23/21 1847  ETH <10    I have personally reviewed the radiological images below and agree with the radiology interpretations.  CT HEAD WO CONTRAST  Result Date: 03/25/2021 CLINICAL DATA:  75 year old female code stroke presentation status post IV tPA. Left MCA territory infarct with mild hemorrhagic changes. EXAM: CT HEAD WITHOUT CONTRAST TECHNIQUE: Contiguous axial images were obtained from the base of the skull through the vertex without intravenous contrast. COMPARISON:  Head CT 03/24/2021 and earlier. FINDINGS: Brain: Mixed cytotoxic edema and rounded, nodular areas of hemorrhage redemonstrated in the left MCA territory. The several dominant foci of blood in the posterior left frontal and left parietal lobes  continuing toward the left occipital pole are stable and size and configuration since yesterday (e.g. Series 4, image 22). Partial effacement of the left lateral ventricle and 2-3 mm of rightward midline shift are stable. No intraventricular hemorrhage. Probable gyral petechial hemorrhage at the left temporal operculum is stable on series 4, image 18. No definite extra-axial hemorrhage. Right hemisphere and posterior fossa gray-white matter differentiation is stable and within normal limits. No ventriculomegaly. Basilar cisterns remain patent. Vascular: Calcified atherosclerosis at the skull base. Skull: No acute osseous abnormality identified. Sinuses/Orbits: Visualized paranasal sinuses and mastoids are stable and well aerated. Other: Stable right nasoenteric tube. Mild leftward gaze is stable. Visualized scalp soft tissues are within normal limits. IMPRESSION: 1. Stable Left MCA infarct with hemorrhagic transformation since yesterday. Extension to the Left MCA/PCA watershed territory as before. Partially effaced left lateral ventricle and trace rightward midline shift are stable. No extra-axial extension of blood. 2. No new areas of involvement or new intracranial abnormality. Electronically Signed   By: Genevie Ann M.D.   On: 03/25/2021 05:19   CT HEAD WO CONTRAST  Result Date: 03/24/2021 CLINICAL DATA:  Stroke.  24 hours post tPA EXAM: CT HEAD WITHOUT CONTRAST TECHNIQUE: Contiguous axial images were obtained from the base of the skull through the vertex without intravenous contrast. COMPARISON:  CT head 03/23/2021 FINDINGS: Brain: Interval development of left MCA infarct with hypodensity in the left frontal and parietal lobe. There is also involvement of the left occipital lobe. There is hemorrhagic transformation of the infarct involving the left temporal and parietal lobe with a moderate amount of blood present. Ventricle size normal.  No midline shift. Vascular: Negative for hyperdense vessel Skull: Negative  Sinuses/Orbits: Mucosal edema paranasal sinuses. Feeding tube in place. Air-fluid level sphenoid sinus. Negative orbit. Other: None IMPRESSION: Interval development of acute infarct in the left MCA territory with hypodensity in the left temporal and parietal lobe. This also extends into the left occipital lobe. There has been interval hemorrhagic transformation of the infarct with moderate amount of blood present within the infarction. Dr. Cheral Marker  has been paged to discuss the results. Electronically Signed   By:  Franchot Gallo M.D.   On: 03/24/2021 18:30   DG CHEST PORT 1 VIEW  Result Date: 03/24/2021 CLINICAL DATA:  Fevers EXAM: PORTABLE CHEST 1 VIEW COMPARISON:  11/20/2018 FINDINGS: Cardiac shadow is enlarged. Postsurgical changes are again seen. Feeding catheter is noted extending into the stomach. Lungs are hypoinflated. Mild vascular congestion is noted without parenchymal edema. Left basilar opacity is noted which may represent early infiltrate. IMPRESSION: Mild CHF without pulmonary edema. Left basilar opacity which may represent an early infiltrate. Electronically Signed   By: Inez Catalina M.D.   On: 03/24/2021 17:54   DG Abd Portable 1V  Result Date: 03/24/2021 CLINICAL DATA:  Feeding tube placement EXAM: PORTABLE ABDOMEN - 1 VIEW COMPARISON:  Portable exam 1314 hours without priors for comparison FINDINGS: Tip of feeding tube projects over distal gastric antrum. Visualized bowel gas pattern normal. Atherosclerotic calcifications aorta and splenic artery. Prior median sternotomy and LEFT atrial appendage clipping. Subsegmental atelectasis at LEFT lung base. IMPRESSION: Tip of feeding tube projects over distal gastric antrum. Electronically Signed   By: Lavonia Dana M.D.   On: 03/24/2021 13:40   ECHOCARDIOGRAM COMPLETE  Result Date: 03/24/2021    ECHOCARDIOGRAM REPORT   Patient Name:   Danielle Payne Date of Exam: 03/24/2021 Medical Rec #:  [id number]       Height:       63.0 in Accession #:     [id number]      Weight:       203.7 lb Date of Birth:  January 22, 1946      BSA:          1.949 m Patient Age:    29 years        BP:           169/83 mmHg Patient Gender: F               HR:           109 bpm. Exam Location:  Inpatient Procedure: 2D Echo Indications:    stroke  History:        Patient has prior history of Echocardiogram examinations, most                 recent 05/26/2018. CAD, Arrythmias:Atrial Fibrillation; Risk                 Factors:Diabetes and Hypertension.  Sonographer:    Johny Chess Referring Phys: [id number] HUNTER J COLLINS  Sonographer Comments: Suboptimal apical window. Image acquisition challenging due to uncooperative patient, Image acquisition challenging due to patient body habitus and restraints. kept moving. IMPRESSIONS  1. Left ventricular ejection fraction, by estimation, is 60 to 65%. The left ventricle has normal function. The left ventricle has no regional wall motion abnormalities. Left ventricular diastolic function could not be evaluated.  2. Right ventricular systolic function is normal. The right ventricular size is normal.  3. The mitral valve is grossly normal. No evidence of mitral valve regurgitation. No evidence of mitral stenosis.  4. The aortic valve is grossly normal. Aortic valve regurgitation is mild. No aortic stenosis is present. FINDINGS  Left Ventricle: Left ventricular ejection fraction, by estimation, is 60 to 65%. The left ventricle has normal function. The left ventricle has no regional wall motion abnormalities. The left ventricular internal cavity size was normal in size. There is  borderline left ventricular hypertrophy. Left ventricular diastolic function could not be evaluated due to atrial fibrillation. Left ventricular diastolic function could not be evaluated. Right Ventricle: The  right ventricular size is normal. Right vetricular wall thickness was not well visualized. Right ventricular systolic function is normal. Left Atrium: Left atrial size  was normal in size. Right Atrium: Right atrial size was not well visualized. Pericardium: There is no evidence of pericardial effusion. Mitral Valve: The mitral valve is grossly normal. No evidence of mitral valve regurgitation. No evidence of mitral valve stenosis. Tricuspid Valve: The tricuspid valve is grossly normal. Tricuspid valve regurgitation is not demonstrated. Aortic Valve: The aortic valve is grossly normal. Aortic valve regurgitation is mild. No aortic stenosis is present. Pulmonic Valve: The pulmonic valve was grossly normal. Pulmonic valve regurgitation is not visualized. Aorta: The aortic root and ascending aorta are structurally normal, with no evidence of dilitation. IAS/Shunts: The atrial septum is grossly normal.  LEFT VENTRICLE PLAX 2D LVIDd:         4.60 cm LVIDs:         3.40 cm LV PW:         1.10 cm LV IVS:        1.30 cm LVOT diam:     1.90 cm LVOT Area:     2.84 cm  IVC IVC diam: 2.20 cm LEFT ATRIUM           Index LA diam:      4.40 cm 2.26 cm/m LA Vol (A4C): 60.2 ml 30.89 ml/m   AORTA Ao Root diam: 2.80 cm Ao Asc diam:  3.20 cm  SHUNTS Systemic Diam: 1.90 cm Mertie Moores MD Electronically signed by Mertie Moores MD Signature Date/Time: 03/24/2021/5:19:01 PM    Final    CT HEAD CODE STROKE WO CONTRAST  Result Date: 03/23/2021 CLINICAL DATA:  Code stroke.  Acute neuro deficit.  Aphasia EXAM: CT HEAD WITHOUT CONTRAST TECHNIQUE: Contiguous axial images were obtained from the base of the skull through the vertex without intravenous contrast. COMPARISON:  None. FINDINGS: Brain: Mild atrophy and mild white matter hypodensity bilaterally. Negative for acute infarct, hemorrhage, mass. Vascular: Negative for hyperdense vessel Skull: Negative Sinuses/Orbits: Mild mucosal edema paranasal sinuses. Negative orbit Other: None ASPECTS ([facility] Stroke Program Early CT Score) - Ganglionic level infarction (caudate, lentiform nuclei, internal capsule, insula, M1-M3 cortex): 7 - Supraganglionic  infarction (M4-M6 cortex): 3 Total score (0-10 with 10 being normal): 10 IMPRESSION: 1. No acute abnormality. 2. ASPECTS is 10 3. Code stroke imaging results were communicated on 03/23/2021 at 6:59 pm to provider [place] via text page Electronically Signed   By: Franchot Gallo M.D.   On: 03/23/2021 19:00   CT ANGIO HEAD CODE STROKE  Result Date: 03/23/2021 CLINICAL DATA:  Expressive aphasia EXAM: CT ANGIOGRAPHY HEAD AND NECK TECHNIQUE: Multidetector CT imaging of the head and neck was performed using the standard protocol during bolus administration of intravenous contrast. Multiplanar CT image reconstructions and MIPs were obtained to evaluate the vascular anatomy. Carotid stenosis measurements (when applicable) are obtained utilizing NASCET criteria, using the distal internal carotid diameter as the denominator. CONTRAST:  16mL OMNIPAQUE IOHEXOL 350 MG/ML SOLN COMPARISON:  None. FINDINGS: CTA NECK FINDINGS SKELETON: There is no bony spinal canal stenosis. No lytic or blastic lesion. OTHER NECK: Normal pharynx, larynx and major salivary glands. No cervical lymphadenopathy. Unremarkable thyroid gland. UPPER CHEST: No pneumothorax or pleural effusion. No nodules or masses. AORTIC ARCH: There is calcific atherosclerosis of the aortic arch. There is no aneurysm, dissection or hemodynamically significant stenosis of the visualized portion of the aorta. Conventional 3 vessel aortic branching pattern. The visualized proximal subclavian arteries are  widely patent. RIGHT CAROTID SYSTEM: No dissection, occlusion or aneurysm. Mild atherosclerotic calcification at the carotid bifurcation without hemodynamically significant stenosis. LEFT CAROTID SYSTEM: No dissection, occlusion or aneurysm. Mild atherosclerotic calcification at the carotid bifurcation without hemodynamically significant stenosis. VERTEBRAL ARTERIES: Left dominant configuration. Both origins are clearly patent. There is no dissection, occlusion or  flow-limiting stenosis to the skull base (V1-V3 segments). CTA HEAD FINDINGS POSTERIOR CIRCULATION: --Vertebral arteries: Normal V4 segments. --Inferior cerebellar arteries: Normal. --Basilar artery: Normal. --Superior cerebellar arteries: Normal. --Posterior cerebral arteries (PCA): Normal. ANTERIOR CIRCULATION: --Intracranial internal carotid arteries: Normal. --Anterior cerebral arteries (ACA): Normal. Both A1 segments are present. Patent anterior communicating artery (a-comm). --Middle cerebral arteries (MCA): Normal. VENOUS SINUSES: As permitted by contrast timing, patent. ANATOMIC VARIANTS: None Review of the MIP images confirms the above findings. IMPRESSION: 1. No emergent large vessel occlusion or high-grade stenosis of the intracranial arteries. 2. Mild bilateral carotid bifurcation atherosclerosis without hemodynamically significant stenosis by NASCET criteria. Aortic Atherosclerosis (ICD10-I70.0). Electronically Signed   By: Ulyses Jarred M.D.   On: 03/23/2021 19:38   CT ANGIO NECK CODE STROKE  Result Date: 03/23/2021 : CLINICAL DATA:   Expressive aphasia EXAM: CT ANGIOGRAPHY HEAD AND NECK TECHNIQUE: Multidetector CT imaging of the head and neck was performed using the standard protocol during bolus administration of intravenous contrast. Multiplanar CT image reconstructions and MIPs were obtained to evaluate the vascular anatomy. Carotid stenosis measurements (when applicable) are obtained utilizing NASCET criteria, using the distal internal carotid diameter as the denominator. CONTRAST:   31mL OMNIPAQUE IOHEXOL 350 MG/ML SOLN COMPARISON:   None. FINDINGS: CTA NECK FINDINGS SKELETON: There is no bony spinal canal stenosis. No lytic or blastic lesion. OTHER NECK: Normal pharynx, larynx and major salivary glands. No cervical lymphadenopathy. Unremarkable thyroid gland. UPPER CHEST: No pneumothorax or pleural effusion. No nodules or masses. AORTIC ARCH: There is calcific atherosclerosis of the aortic  arch. There is no aneurysm, dissection or hemodynamically significant stenosis of the visualized portion of the aorta. Conventional 3 vessel aortic branching pattern. The visualized proximal subclavian arteries are widely patent. RIGHT CAROTID SYSTEM: No dissection, occlusion or aneurysm. Mild atherosclerotic calcification at the carotid bifurcation without hemodynamically significant stenosis. LEFT CAROTID SYSTEM: No dissection, occlusion or aneurysm. Mild atherosclerotic calcification at the carotid bifurcation without hemodynamically significant stenosis. VERTEBRAL ARTERIES: Left dominant configuration. Both origins are clearly patent. There is no dissection, occlusion or flow-limiting stenosis to the skull base (V1-V3 segments). CTA HEAD FINDINGS POSTERIOR CIRCULATION: --Vertebral arteries: Normal V4 segments. --Inferior cerebellar arteries: Normal. --Basilar artery: Normal. --Superior cerebellar arteries: Normal. --Posterior cerebral arteries (PCA): Normal. ANTERIOR CIRCULATION: --Intracranial internal carotid arteries: Normal. --Anterior cerebral arteries (ACA): Normal. Both A1 segments are present. Patent anterior communicating artery (a-comm). --Middle cerebral arteries (MCA): Normal. VENOUS SINUSES: As permitted by contrast timing, patent. ANATOMIC VARIANTS: None Review of the MIP images confirms the above findings. IMPRESSION: 1. No emergent large vessel occlusion or high-grade stenosis of the intracranial arteries. 2. Mild bilateral carotid bifurcation atherosclerosis without hemodynamically significant stenosis by NASCET criteria. Aortic Atherosclerosis (ICD10-I70.0). Electronically Signed   By: Ulyses Jarred M.D.   On: 03/23/2021 19:45      PHYSICAL EXAM  Temp:  [97.9 F (36.6 C)-100.2 F (37.9 C)] 98.5 F (36.9 C) (06/19 0800) Pulse Rate:  [60-213] 67 (06/19 0900) Resp:  [16-27] 20 (06/19 0900) BP: (110-165)/(46-146) 127/62 (06/19 0900) SpO2:  [83 %-100 %] 95 % (06/19 0900)  General  -obese elderly Caucasian lady.  Not in distress.  Ophthalmologic - fundi  not visualized due to noncooperation.  Cardiovascular - irregularly irregular heart rate and rhythm with afib on tele  Neuro - Alert. Sitting up in bed with her eyes open and is attentive to examiner. Global aphasia only able to say ok and yes to every question. Speaks occasional words and short sentences are difficult to understand.  Not follow simple commands except mouth open, but able to pantomime. Left gaze preference, barely cross midline, tracking on the left, inconsistently blinking to visual threat on the left but not blinking to the right. Right mild facial droop. Tongue protrusion not cooperative. RUE plegic. LUE antigravity. BLE withdraws to noxious with left more than right. UTA sensation and coordination given aphasia. Gait deferred due to right sided weakness.   ASSESSMENT/PLAN Ms. Danielle Payne is a 75 y.o. female with history of A. fib off AC, CHF, CAD, hypertension, hyperlipidemia, CAD status post CABG admitted for global aphasia.  tPA given.  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 fib not on AC Resultant global aphasia with right hemiparesis CT no acute abnormality CTA head and neck no LVO MRI pending 2D Echo EF 60 to 65% LDL 80.9 HgbA1c 8.8  HTS for cerebral edema, NA goal 150-155  SCDs for VTE prophylaxis  No antithrombotic prior to admission, now on No antithrombotic within 24 hours of tPA due to hemorrhagic transformation Ongoing aggressive stroke risk factor management Therapy recommendations: Pending Disposition: Pending  Chronic A. fib with RVR Follows with Dr. Oval Linsey on 01/28/20 Home meds including Coreg and Cardizem Metoprolol IV as needed Will restart home meds once po access Cardiology consulted for assistance; continuing Coreg + Dilt for rate control  Was on Coumadin before but taken off by patient self "concern that it was stiffening her  arteries" "unwilling to take Eliquis or Xarelto because she does not know "what is in them."   taking a natural blood thinner sold over-the-counter upon the recommendation of her herbalist. Have discussed initiating a DOAC for atrial fibrillation with daughter- she is in agreement   Hypertension Unstable, high On Cleviprex Resume Coreg and Cardizem once p.o. access Long term BP goal normotensive  Hyperlipidemia Home meds: None  LDL 80.9, goal < 70 Consider statin once p.o. access Continue statin at discharge  Dysphagia Did not pass swallow Pending MBS Speech following 1 choking event this morning Cortrak for enteral nutrition   Other Stroke Risk Factors Advanced age Obesity, Body mass index is 35.15 kg/m.  Coronary artery disease status post CABG CHF  Other Active Problems TG 1219, likely contamination from [facility] day # 3 Plan continue hypertonic saline drip through peripheral IV at 75 cc an hour with serum sodium goal between 150-155.  Resume home medications through core track tube.  Keep systolic blood pressure goal 120-160.Continue antibiotics for presumed aspiration pneumonia.  Appreciate critical care team's and cardiology's help   with medical management.  .  Discussed with Dr. Silas Flood critical care medicine. This patient is critically ill due to stroke status post tPA, chronic A. fib with RVR, dysphagia, hypertensive emergency, CHF and at significant risk of neurological worsening, death form recurrent stroke, hemorrhagic conversion, bleeding from tPA, heart failure, aspiration pneumonia, sepsis, seizure. This patient's care requires constant monitoring of vital signs, hemodynamics, respiratory and cardiac monitoring, review of multiple databases, neurological assessment, discussion with family, other specialists and medical decision making of high complexity. I spent 30 minutes of neurocritical care time in the care of this patient. I had  long discussion  with daughter and son at bedside, updated pt current condition, treatment plan and potential prognosis, and answered all the questions.  They expressed understanding and appreciation.   Antony Contras, MD Stroke Neurology 03/26/2021 9:55 AM    To contact Stroke Continuity provider, please refer to [email or web address] After hours, contact General Neurology

## 2021-03-26 NOTE — Progress Notes (Signed)
1206:  MRI call this RN to schedule MRI.  RN stated anytime MRI is free to give RN call back.    1515:  RN has not receive call from MRI.  Stroke team notified as discuss earlier during rounds.

## 2021-03-26 NOTE — Progress Notes (Signed)
On-call cross coverage progress note The daughter indicated that the patient never wanted to be a DNR-there was some miscommunication with communication with PCCM. She does not want long-term intubation but in the short-term if she has a cardiac arrest or requires intubation, that should be fine. Her CODE STATUS has been changed to full code. -- Amie Portland, MD Neurologist [facility] Pager: [phone]

## 2021-03-26 NOTE — Progress Notes (Signed)
Pt transported to CT and back to 4N 21 on BIPAP. No complications noted.

## 2021-03-26 NOTE — Progress Notes (Signed)
1630:  Patient's respiratory effort increase and audible wheezing.  CCM notified and assess pt at bedside.  New orders received.  1730:  CCM updated on patient's status. Will come assess patient again.  1745:  RT updated on patient's respiratory status.

## 2021-03-26 NOTE — Progress Notes (Signed)
ON CALL CROSS COVER PROGRESS NOTE  Called by PCCM for patient being obtunded. No clear respiratory or metabolic cause identified to explain current clinical picture. Currently on bipap, eyes closed, grimaces to nox stim, does not move any ext spontaneously. PERRL. Gaze midline. No movement to nox stim on right. Some withdrawal on left. VSS.  CT head reviewed - unchanged from yesterday. 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 No new infarction or hemorrhage noted.  Recs: Stat EEG - rapid [facility] EEG leads applied. Dr. Hortense Ramal notified and prelim read negative for seizures. Medical management per PCCM.  Family at bedside, multiple members. Earlier discussion with PCCM and family - family made her DNR. Decision was to r/o status and treat if present. 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 They remain hopeful for recovery - I showed them CT scans with the extent of stroke and HT and explained that she remains critically sick.  For now maintain current scope of treatment and no intubation.  Discussed with on call PCCM MD.  Stroke team will round in the AM again.  I am on call at night should re-evaluation be needed   -- Amie Portland, MD Neurologist [facility] Pager: [phone]  CC time attestation Additional 50 min of CC time spent in the care of this patient  This patient's care requires constant monitoring of vital signs,  hemodynamics, respiratory and cardiac monitoring, review of multiple databases, neurological assessment, discussion with family, other specialists and medical decision making of high complexity including performing a rapid EEG, and discussion with family, RN and PCCM.

## 2021-03-26 NOTE — Progress Notes (Signed)
   Afib is rate controlled- main issues with cerebral edema per [facility] - cardiology will continue to follow.  Pixie Casino, MD, [facility], [place] Director of the [facility] Lipid Disorders &  Cardiovascular Risk Reduction Clinic Diplomate of the American Board of Clinical Lipidology Attending Cardiologist  Direct Dial: [phone]  Fax: [phone]  Website:  [email or web address]

## 2021-03-26 NOTE — Progress Notes (Signed)
Notified CCM regarding low urine output, patient was straight catheterized twice during day shift. @ 0200 bladder scan reported 133, no urine output in the external catheter obtained. No orders at this time.

## 2021-03-26 NOTE — Progress Notes (Signed)
PT Cancellation Note  Patient Details Name: Danielle Payne MRN: [id number] DOB: 04/13/1946   Cancelled Treatment:    Reason Eval/Treat Not Completed: Active bedrest order  Wyona Almas, PT, DPT Acute Rehabilitation Services Pager [phone] Office [phone]    Deno Etienne 03/26/2021, 12:15 PM

## 2021-03-26 NOTE — Progress Notes (Signed)
RT called to assess pts breathing. Pts breathing is very labored with belly breathing. RN gave PRN neb treatment and solumedrol as ordered by MD. This RT placed pt on BIPAP 14/7/50%. MD paged and is on the way to assess the pt.

## 2021-03-26 NOTE — Progress Notes (Signed)
  Speech Language Pathology Treatment: Dysphagia  Patient Details Name: Danielle Payne MRN: [id number] DOB: June 06, 1946 Today's Date: 03/26/2021 Time: 5003-7048 SLP Time Calculation (min) (ACUTE ONLY): 33 min  Assessment / Plan / Recommendation Clinical Impression  Pt today is fully alert, accepting of oral care - readily opening her mouth.   Dried secretions noted on hard palate as well as anterior tongue - able to clear partially with toothete with water and oral moisture.  Delayed cough *occured for overt 1 minute* after oral care with moisture- concerning for laryngeal infilatration of water and/or secretions.   Provided single ice chip boluses- which pt readily masticated and swallowed without indication of dysphagia or aspiration.    She is demonstrating deep abdominal breathing - concerning for potential aspiration with dyspnea- although rate of breathing is not rapid.  Pt is not consistently following directions although she tries.    Her utterances include "I don't know" and "okay" appropriately to basic need questions x2.  She was able to point to her written last name with choice of 2.   Did not point to correct item for verbal and written choice of two basic items.  Attempts at phonemic approximation for "watch" and "pen" observed with total verba/visual cues.    Pt continues with clinical indications of oropharyngeal dysphagia as well as severe aphasia..  Automatic phrases including "bye" attempted x2 without pt approximation attempts. She smiles during session x3.     She will benefit from continued SlP to maximize rehab potential and decrease caregiver burden.  Posted sign in room indicating recommendation for single small ice chips provided by RN/SLP after oral care.    HPI HPI: Danielle Payne is an 75 y.o. female adm with aphasia - receptive and expressive - Found to have Left MCA CVA and is s/p tPa.   PMH + for atral fibrillation, asthma, chronic combined systolic and diastolic  HF, CAD, HTN, hypercholesterolemia and CAD s/p angioplasty and CABG who presents with acute onset of dense receptive and expressive aphasia.  CT showed  Interval development of acute infarct in the left MCA territory with hypodensity in the left temporal and parietal lobe. This also extends into the left occipital lobe.  CXR 6/17 concerning for edema/Mild CHF, Left basilar opacity which may represent an early infiltrate. Pt has not passed  [facility] swallow screen and has not been ready for po.  Has Cortrak in place at this time.  Has been lethargic with some increased Respiratory demands and per RN, CCM has been consulted.      SLP Plan  Continue with current plan of care       Recommendations  Medication Administration: Via alternative means                Follow up Recommendations: Inpatient Rehab SLP Visit Diagnosis: Aphasia (R47.01);Cognitive communication deficit (R41.841);Dysphagia, unspecified (R13.10);Dysphagia, oropharyngeal phase (R13.12) Plan: Continue with current plan of care       GO              Kathleen Lime, MS [place] Office [phone] Pager [phone]   Macario Golds 03/26/2021, 9:59 AM

## 2021-03-26 NOTE — Significant Event (Signed)
PCCM progress note  Progress.  Primarily respiratory.  Escalated respiratory bleeding.  Chest x-ray wheezing on exam.  Bronchodilator Solu-Medrol Lasix ordered.  Was called to bedside later as patient was placed on BiPAP.  On evaluation, patient somnolent.  Barely arousable sternal rub.  Stat CT ordered.  This on my interpretation revealed stable to improved improved midline shift.  Subsequently obtained with PCO2 of 28.  No evidence of CO2 narcosis.   Met with husband at bedside.  Other family members present.  Family members including Vaughan Basta present via phone.  Asked and broached goals of care.  Discussed neck step which should be ventilator given inability protect airway.  The indication melena, hospital.  She wanted at home.  Indicate several months to year plus of poor quality of life.  Do not think she would want life-sustaining measures such as ventilator.  Confirmed after several minutes on several occasions DNR status.  If she were to worsen to let her pass naturally.  They agreed.  DNR placed in chart.  For further work-up of encephalopathy, stat EEG ordered.  If patient were to be in status epilepticus would consider approaching family and considering intubation and burst suppression.  However at this time there is no reversible cause of encephalopathy.  Will escalate antibiotics in case component of sepsis.

## 2021-03-26 NOTE — Progress Notes (Signed)
CCM Brief Progress Note  75 yo F admitted to stroke service with L MCA CVA 6/16, started on hypertonic saline 6/18 prompting CCM consult.  Arrived to bedside to evaluate respiratory pattern Ordered CXR which looks like possible pulm edema  She has some intermittent abdominal muscle use. No nasal flaring, no trap or scalene use. She also has what sounds like an expiratory squeaking wheeze.  SpO2 >90 on 5LNC. Supine position.  Aphasia limiting  Started on unasyn previously for possible aspiration    Hypoxic respiratory failure  P -albuterol neb now -IV mag -IV solumedrol now -20mg  lasix  -O2 as needed for SpO2 > 90      Danielle Gum MSN, AGACNP-BC [place] for pager  03/26/2021, 5:06 PM

## 2021-03-26 NOTE — Procedures (Signed)
Patient Name: Danielle Payne  MRN: [id number]  Epilepsy Attending: Lora Havens  Referring Physician/Provider: Dr Amie Portland Date: 03/26/2021 Duration: 24.33 mins  Patient history: 75yo F with L MCA stroke, continues to be altered. EEG to evaluate for status epilepticus.   Level of alertness: lethargic   AEDs during EEG study: None  Technical aspects: This EEG was obtained using a 10 lead EEG system positioned circumferentially without any parasagittal coverage (rapid EEG). Computer selected EEG is reviewed as well as background features and all clinically significant events.   Description: EEG showed continuous generalized and lateralized left hemisphere polymorphic 3 to 6 Hz theta-delta slowing.  Hyperventilation and photic stimulation were not performed.     ABNORMALITY - Continuous slow, generalized and lateralized left hemisphere  IMPRESSION: This study is  suggestive of cortical dysfunction arising from left hemisphere, likely secondary to underlying stroke as well as severe diffuse encephalopathy, nonspecific etiology. No seizures or epileptiform discharges were seen throughout the recording.  Dr Rory Percy was notified.   Izzy Doubek Barbra Sarks

## 2021-03-26 NOTE — Progress Notes (Signed)
NAME:  Danielle Payne, MRN:  [id number], DOB:  22-Feb-1946, LOS: 3 ADMISSION DATE:  03/23/2021, CONSULTATION DATE: 03/26/2019 REFERRING MD:  Dr. Leonie Man, CHIEF COMPLAINT:  Acute onset of aphasia and altered mental status   History of Present Illness:  75 year old female with history of chronic atrial fibrillation, chronic combined systolic and diastolic congestive heart failure, hypertension and coronary artery disease who initially presented with sudden onset of nausea aphasia and altered mental status.  She was diagnosed with acute left MCA stroke, received tPA and was admitted to neurosurgical ICU for close monitoring. Over next 24 hours patient started getting lethargic, she had hemorrhagic changes and a left ischemic stroke with hematoma. She is being started on 3 Saline for cerebral edema with midline shift and, PCCM was consulted for help with management  Pertinent  Medical History   Past Medical History:  Diagnosis Date   Allergy    Asthma    Atrial fibrillation ([place])    Chronic combined systolic and diastolic heart failure ([place]) 05/14/2018   Coronary artery disease    Diverticulitis    Diverticulitis    Hypertension    Pure hypercholesterolemia 05/14/2018   Stented coronary artery      Significant Hospital Events: Including procedures, antibiotic start and stop dates in addition to other pertinent events   6/16 admitted s/p tPA 6/18, CT head showed increasing cerebral edema with hemorrhagic conversion and midline shift, PCCM was consulted 6/19 more alert while on HTS  Interim History / Subjective:  NAEON. More alert per discussion with Dr. Leonie Man.   Objective   Blood pressure (!) 125/58, pulse 77, temperature 98.5 F (36.9 C), temperature source Oral, resp. rate (!) 21, height 5\' 3"  (1.6 m), weight 90 kg, SpO2 96 %.        Intake/Output Summary (Last 24 hours) at 03/26/2021 1049 Last data filed at 03/26/2021 0800 Gross per 24 hour  Intake 4401.62 ml  Output 1250 ml   Net 3151.62 ml    Filed Weights   03/23/21 1800 03/25/21 0400  Weight: 92.4 kg 90 kg    Examination:   Physical exam: General: Crtitically ill-appearing female, lying on the bed HEENT: [place]/AT, eyes anicteric.   Neuro: Alert, opens eyes with vocal stimuli, follows commands, withdrawing in bilateral lower extremities minimal movement in right upper extremity, antigravity in left upper extremity Chest: Coarse breath sounds, no wheezes or rhonchi Heart: Irregularly irregular, no murmurs or gallops Abdomen: Soft, nontender, nondistended, bowel sounds present Skin: No rash  Labs/imaging that I havepersonally reviewed  (right click and "Reselect all SmartList Selections" daily)  1. Stable Left MCA infarct with hemorrhagic transformation since yesterday. Extension to the Left MCA/PCA watershed territory as before. Partially effaced left lateral ventricle and trace rightward midline shift are stable. No extra-axial extension of blood.  Resolved Hospital Problem list     Assessment & Plan:  Acute left MCA stroke s/p tPA, complicated with hemorrhagic transformation Cytotoxic cerebral edema with midline shift Acute encephalopathy due to acute stroke Chronic atrial fibrillation not on anticoagulation Hypertension Hyperlipidemia Dysphagia due to acute stroke Hypokalemia Poorly controlled GERD diabetes type 2 Acute kidney injury due to dehydration Aspiration  Stroke team is following Continue neuro watch every hour Hold antiplatelet transformation Continue atorvastatin Avoid sedation Continue 3% saline with sodium with a goal 150-155 Repeat CT tomorrow, if improved tentative plan to wean HTS Continue to hold anticoagulation Continue with Coreg and diltiazem for heart rate control Keep SBP less than 140, increase diltiazem today, wean nicardipine  gtt Continue tube feeds Electrolytes are being supplemented Lantus and continue sliding scale Complete course of unasyn  Best  Practice (right click and "Reselect all SmartList Selections" daily)   Diet/type: tubefeeds Pain/Anxiety/Delirium protocol Not indicated VAP protocol (if indicated): Not indicated DVT prophylaxis: SCD GI prophylaxis: N/A Glucose control:  SSI and Basal coverage Central venous access:  N/A Arterial line:  N/A Foley:  N/A Mobility:  bed rest  PT consulted: Yes Code Status:  full code Last date of multidisciplinary goals of care discussion [Per primary team] Disposition: remains critically ill, will stay in intensive care        Labs   CBC: Recent Labs  Lab 03/23/21 1847 03/23/21 1858 03/25/21 0610 03/26/21 0300  WBC 8.8  --  17.0* 13.1*  NEUTROABS 4.8  --   --   --   HGB 15.1* 15.6* 13.8 12.6  HCT 46.0 46.0 41.0 37.9  MCV 92.0  --  88.0 90.7  PLT 233  --  238 181     Basic Metabolic Panel: Recent Labs  Lab 03/23/21 1847 03/23/21 1858 03/24/21 1650 03/25/21 0610 03/25/21 0921 03/25/21 1627 03/25/21 2022 03/26/21 0300 03/26/21 0907  NA 135 137  --  135 132* 137 142 148*  148* 150*  K 3.7 3.8  --  3.4*  --   --   --  3.7  --   CL 101 103  --  101  --   --   --  120*  --   CO2 23  --   --  23  --   --   --  22  --   GLUCOSE 217* 214*  --  256*  --   --   --  204*  --   BUN 16 21  --  34*  --   --   --  38*  --   CREATININE 0.90 0.80  --  1.28*  --   --   --  1.02*  --   CALCIUM 9.2  --   --  9.1  --   --   --  8.4*  --   MG  --   --  1.8 2.1  --  2.1  --  2.2  --   PHOS  --   --  2.5 3.0  --  3.5  --  3.2  --     GFR: Estimated Creatinine Clearance: 49.9 mL/min (A) (by C-G formula based on SCr of 1.02 mg/dL (H)). Recent Labs  Lab 03/23/21 1847 03/25/21 0610 03/26/21 0300  WBC 8.8 17.0* 13.1*     Liver Function Tests: Recent Labs  Lab 03/23/21 1847  AST 24  ALT 23  ALKPHOS 88  BILITOT 0.6  PROT 7.4  ALBUMIN 3.8    No results for input(s): LIPASE, AMYLASE in the last 168 hours. No results for input(s): AMMONIA in the last 168  hours.  ABG    Component Value Date/Time   TCO2 27 03/23/2021 1858      Coagulation Profile: Recent Labs  Lab 03/23/21 1847  INR 1.1     Cardiac Enzymes: No results for input(s): CKTOTAL, CKMB, CKMBINDEX, TROPONINI in the last 168 hours.  HbA1C: Hemoglobin A1C  Date/Time Value Ref Range Status  03/04/2020 12:00 AM 9.1  Final   Hgb A1c MFr Bld  Date/Time Value Ref Range Status  03/24/2021 05:10 AM 8.8 (H) 4.8 - 5.6 % Final    Comment:    (NOTE)  Prediabetes: 5.7 - 6.4         Diabetes: >6.4         Glycemic control for adults with diabetes: <7.0   11/18/2019 12:46 PM 7.9 (H) 4.8 - 5.6 % Final    Comment:             Prediabetes: 5.7 - 6.4          Diabetes: >6.4          Glycemic control for adults with diabetes: <7.0     CBG: Recent Labs  Lab 03/25/21 1509 03/25/21 1948 03/25/21 2333 03/26/21 0428 03/26/21 0818  GLUCAP 266* 239* 223* 173* 144*     Review of Systems:   unable to obtain due to encephalopathy  Past Medical History:  She,  has a past medical history of Allergy, Asthma, Atrial fibrillation ([place]), Chronic combined systolic and diastolic heart failure ([place]) (05/14/2018), Coronary artery disease, Diverticulitis, Diverticulitis, Hypertension, Pure hypercholesterolemia (05/14/2018), and Stented coronary artery.   Surgical History:   Past Surgical History:  Procedure Laterality Date   ABDOMINAL HYSTERECTOMY     CARDIAC CATHETERIZATION     CARDIOVERSION N/A 12/21/2014   Procedure: CARDIOVERSION;  Surgeon: Adrian Prows, MD;  Location: [place];  Service: Cardiovascular;  Laterality: N/A;   CORONARY ANGIOPLASTY     CORONARY ARTERY BYPASS GRAFT       Social History:   reports that she has never smoked. She has never used smokeless tobacco. She reports that she does not drink alcohol and does not use drugs.   Family History:  Her family history includes CAD in her maternal aunt and mother; Diabetes Mellitus II in her brother and sister;  Hypertension in her mother; Prostate cancer in her father; Stroke in her mother.   Allergies No Known Allergies   Home Medications  Prior to Admission medications   Medication Sig Start Date End Date Taking? Authorizing Provider  albuterol (VENTOLIN HFA) 108 (90 Base) MCG/ACT inhaler INHALE TWO PUFFS INTO THE LUNGS EVERY 4 HOURS AS NEEDED FOR WHEEZING OR SHORTNESS OF BREATH 02/02/20   Glendale Chard, MD  carvedilol (COREG) 25 MG tablet Take 1 tablet (25 mg total) by mouth 2 (two) times daily with a meal. 03/09/20   Skeet Latch, MD  diltiazem (CARDIZEM CD) 180 MG 24 hr capsule Take 1 capsule (180 mg total) by mouth daily. 03/31/20   Skeet Latch, MD  furosemide (LASIX) 40 MG tablet Take 40 mg by mouth daily as needed for fluid or edema.     [provider]  loratadine (CLARITIN) 10 MG tablet Take 10 mg by mouth daily.    [provider]  Melatonin 5 MG TBDP Take 5 mg by mouth at bedtime.     [provider]  NON FORMULARY Natural nasal spray nedi pot    [provider]  NON FORMULARY nattokinasase blood thinner    [provider]  Probiotic Product (PROBIOTIC PO) Take by mouth.    [provider]  Protease POWD by Does not apply route.    [provider]     Total critical care time: 32 minutes  Performed by: Lanier Clam   Critical care time was exclusive of separately billable procedures and treating other patients.   Critical care was necessary to treat or prevent imminent or life-threatening deterioration.   Critical care was time spent personally by me on the following activities: development of treatment plan with patient and/or surrogate as well as nursing, discussions  with consultants, evaluation of patient's response to treatment, examination of patient, obtaining history from patient or surrogate, ordering and performing treatments and interventions, ordering and review of laboratory studies, ordering  and review of radiographic studies, pulse oximetry and re-evaluation of patient's condition.   Lanier Clam, MD [facility] See Amion for contact info

## 2021-03-27 ENCOUNTER — Inpatient Hospital Stay (HOSPITAL_COMMUNITY): Payer: Medicare Other

## 2021-03-27 DIAGNOSIS — I4821 Permanent atrial fibrillation: Secondary | ICD-10-CM

## 2021-03-27 DIAGNOSIS — G9340 Encephalopathy, unspecified: Secondary | ICD-10-CM | POA: Diagnosis not present

## 2021-03-27 DIAGNOSIS — J4551 Severe persistent asthma with (acute) exacerbation: Secondary | ICD-10-CM

## 2021-03-27 DIAGNOSIS — I6300 Cerebral infarction due to thrombosis of unspecified precerebral artery: Secondary | ICD-10-CM | POA: Diagnosis not present

## 2021-03-27 LAB — GLUCOSE, CAPILLARY
Glucose-Capillary: 316 mg/dL — ABNORMAL HIGH (ref 70–99)
Glucose-Capillary: 276 mg/dL — ABNORMAL HIGH (ref 70–99)
Glucose-Capillary: 275 mg/dL — ABNORMAL HIGH (ref 70–99)
Glucose-Capillary: 177 mg/dL — ABNORMAL HIGH (ref 70–99)
Glucose-Capillary: 168 mg/dL — ABNORMAL HIGH (ref 70–99)
Glucose-Capillary: 184 mg/dL — ABNORMAL HIGH (ref 70–99)
Glucose-Capillary: 139 mg/dL — ABNORMAL HIGH (ref 70–99)
Glucose-Capillary: 192 mg/dL — ABNORMAL HIGH (ref 70–99)
Glucose-Capillary: 158 mg/dL — ABNORMAL HIGH (ref 70–99)
Glucose-Capillary: 171 mg/dL — ABNORMAL HIGH (ref 70–99)
Glucose-Capillary: 187 mg/dL — ABNORMAL HIGH (ref 70–99)
Glucose-Capillary: 151 mg/dL — ABNORMAL HIGH (ref 70–99)

## 2021-03-27 LAB — BASIC METABOLIC PANEL
Anion gap: 11 (ref 5–15)
BUN: 50 mg/dL — ABNORMAL HIGH (ref 8–23)
CO2: 20 mmol/L — ABNORMAL LOW (ref 22–32)
Calcium: 8.9 mg/dL (ref 8.9–10.3)
Chloride: 129 mmol/L — ABNORMAL HIGH (ref 98–111)
Creatinine, Ser: 1.45 mg/dL — ABNORMAL HIGH (ref 0.44–1.00)
GFR, Estimated: 37 mL/min — ABNORMAL LOW (ref >60)
Glucose, Bld: 365 mg/dL — ABNORMAL HIGH (ref 70–99)
Potassium: 3.6 mmol/L (ref 3.5–5.1)
Sodium: 160 mmol/L — ABNORMAL HIGH (ref 135–145)

## 2021-03-27 LAB — CBC
HCT: 38 % (ref 36.0–46.0)
Hemoglobin: 12.2 g/dL (ref 12.0–15.0)
MCH: 29.9 pg (ref 26.0–34.0)
MCHC: 32.1 g/dL (ref 30.0–36.0)
MCV: 93.1 fL (ref 80.0–100.0)
Platelets: 218 10*3/uL (ref 150–400)
RBC: 4.08 MIL/uL (ref 3.87–5.11)
RDW: 14.1 % (ref 11.5–15.5)
WBC: 14.4 10*3/uL — ABNORMAL HIGH (ref 4.0–10.5)
nRBC: 0 % (ref 0.0–0.2)

## 2021-03-27 LAB — SODIUM
Sodium: 163 mmol/L (ref 135–145)
Sodium: 165 mmol/L (ref 135–145)
Sodium: 165 mmol/L (ref 135–145)

## 2021-03-27 MED ORDER — IPRATROPIUM-ALBUTEROL 0.5-2.5 (3) MG/3ML IN SOLN
3.0000 mL | RESPIRATORY_TRACT | Status: DC
  Administered 2021-03-27 – 2021-03-29 (×10): 3 mL via RESPIRATORY_TRACT
  Filled 2021-03-27 (×9): qty 3

## 2021-03-27 MED ORDER — SODIUM CHLORIDE 0.9 % IV SOLN
3.0000 g | Freq: Three times a day (TID) | INTRAVENOUS | Status: DC
  Administered 2021-03-27 – 2021-03-28 (×3): 3 g via INTRAVENOUS
  Filled 2021-03-27: qty 8
  Filled 2021-03-27 (×3): qty 3
  Filled 2021-03-27: qty 8

## 2021-03-27 MED ORDER — DEXTROSE 50 % IV SOLN: 0.0000 mL | INTRAVENOUS | Status: DC | PRN

## 2021-03-27 MED ORDER — HYDRALAZINE HCL 20 MG/ML IJ SOLN
20.0000 mg | Freq: Four times a day (QID) | INTRAMUSCULAR | Status: DC | PRN
  Administered 2021-03-27 – 2021-04-01 (×7): 20 mg via INTRAVENOUS
  Filled 2021-03-27 (×7): qty 1

## 2021-03-27 MED ORDER — INSULIN ASPART 100 UNIT/ML IJ SOLN: 5.0000 [IU] | Freq: Once | INTRAMUSCULAR | Status: DC

## 2021-03-27 MED ORDER — IPRATROPIUM-ALBUTEROL 0.5-2.5 (3) MG/3ML IN SOLN
3.0000 mL | Freq: Three times a day (TID) | RESPIRATORY_TRACT | Status: DC
  Administered 2021-03-27: 3 mL via RESPIRATORY_TRACT
  Filled 2021-03-27: qty 3

## 2021-03-27 MED ORDER — METHYLPREDNISOLONE SODIUM SUCC 125 MG IJ SOLR
60.0000 mg | Freq: Four times a day (QID) | INTRAMUSCULAR | Status: DC
  Administered 2021-03-27 – 2021-03-29 (×8): 60 mg via INTRAVENOUS
  Filled 2021-03-27 (×8): qty 2

## 2021-03-27 MED ORDER — INSULIN GLARGINE 100 UNIT/ML ~~LOC~~ SOLN
16.0000 [IU] | Freq: Two times a day (BID) | SUBCUTANEOUS | Status: DC
  Filled 2021-03-27 (×2): qty 0.16

## 2021-03-27 MED ORDER — INSULIN REGULAR(HUMAN) IN NACL 100-0.9 UT/100ML-% IV SOLN
INTRAVENOUS | Status: DC
  Administered 2021-03-27: 14 [IU]/h via INTRAVENOUS
  Administered 2021-03-28: 6.5 [IU]/h via INTRAVENOUS
  Administered 2021-03-28: 1.8 [IU]/h via INTRAVENOUS
  Filled 2021-03-27 (×3): qty 100

## 2021-03-27 NOTE — Progress Notes (Signed)
Assisted tele visit to patient with family member.  Zygmunt Mcglinn McEachran, RN  

## 2021-03-27 NOTE — Progress Notes (Signed)
Date and time results received: 03/27/21 1928   Test: Sodium  Critical Value: 165  Name of Provider Notified: CCM/E-link   Orders Received? Or Actions Taken?: patient previously on hypertonic fluids. Sodium goal is 150-155, and fluids were stopped earlier in day when Na level was above 160. No new orders at this time. Continue to monitor.    Wyn Quaker, RN

## 2021-03-27 NOTE — Progress Notes (Signed)
Discussed care with Dr. Carilyn Goodpasture team.  Patient remains in afib but appears with slow ventricular response. 6 beats of NSVT yesterday afternoon, no concerning findings on telemetry otherwise.   No change to plan from cardiovascular standpoint, we will follow along as needed.  Danielle Kaiser, MD, [place]

## 2021-03-27 NOTE — Progress Notes (Signed)
NAME:  Danielle Payne, MRN:  [id number], DOB:  [birth year], LOS: 4 ADMISSION DATE:  03/23/2021, CONSULTATION DATE: 03/26/2019 REFERRING MD:  Dr. Leonie Man, CHIEF COMPLAINT:  Acute onset of aphasia and altered mental status   History of Present Illness:  75 year old female with history of chronic atrial fibrillation, chronic combined systolic and diastolic congestive heart failure, hypertension and coronary artery disease who initially presented with sudden onset of nausea aphasia and altered mental status.  She was diagnosed with acute left MCA stroke, received tPA and was admitted to neurosurgical ICU for close monitoring. Over next 24 hours patient started getting lethargic, she had hemorrhagic changes and a left ischemic stroke with hematoma. She is being started on 3 Saline for cerebral edema with midline shift and, PCCM was consulted for help with management  Pertinent  Medical History   Past Medical History:  Diagnosis Date   Allergy    Asthma    Atrial fibrillation ([place])    Chronic combined systolic and diastolic heart failure ([place]) 05/14/2018   Coronary artery disease    Diverticulitis    Diverticulitis    Hypertension    Pure hypercholesterolemia 05/14/2018   Stented coronary artery      Significant Hospital Events: Including procedures, antibiotic start and stop dates in addition to other pertinent events   6/16 admitted s/p tPA 6/18, CT head showed increasing cerebral edema with hemorrhagic conversion and midline shift, PCCM was consulted 6/19 more alert while on HTS  Interim History / Subjective:  Not answering questions.  Objective   Blood pressure (!) 110/20, pulse (!) 58, temperature 98.1 F (36.7 C), temperature source Oral, resp. rate 20, height 5\' 3"  (1.6 m), weight 90 kg, SpO2 92 %.    FiO2 (%):  [40 %-50 %] 40 %   Intake/Output Summary (Last 24 hours) at 03/27/2021 1022 Last data filed at 03/27/2021 0800 Gross per 24 hour  Intake 4440.02 ml  Output 925 ml   Net 3515.02 ml    Filed Weights   03/23/21 1800 03/25/21 0400  Weight: 92.4 kg 90 kg    Examination:   Physical exam: General: ill appearing woman lying in bed sleeping, in NAD HEENT: [place]/AT, eyes anicteric. NGT in place. Neuro: sleeping, arouses to stimulation, then falls back asleep. Follows commands on the left with significant stimulation. Withdraws from pain BLE, moves left arm but not moving RUE. Chest: tachypneic, using expiratory accessory muscles, diffuse expiratory wheezing, clear during inhalation. Heart: reg rate, irreg rhythm. Abdomen: obese, soft, NT Skin: warm, dry, no rashes   Labs/imaging that I havepersonally reviewed  (right click and "Reselect all SmartList Selections" daily)   Na+ 160 BG 365 BUN 50 Cr 1.45 WBC 14.4  Resolved Hospital Problem list     Assessment & Plan:  Acute left MCA stroke s/p tPA, complicated with hemorrhagic transformation Cytotoxic cerebral edema with midline shift Acute encephalopathy due to acute stroke Chronic atrial fibrillation not on anticoagulation Hypertension Hyperlipidemia Dysphagia due to acute stroke Hypokalemia Poorly controlled GERD diabetes type 2 Acute kidney injury due to dehydration Aspiration   Acute L MCA stroke s/p TPA, hemorrhagic transformation Acute encephalopathy due to stroke -appreciate stroke team's management -hypertonic saline-- hold for now with rapid rise in Na+ and continue monitoring. -hourly neuro checks  -holding AP and DVT prophylaxis -holding atorvastatin - no sedating meds -SBP goal <160 -NGT for meds and nutrition  Afib, rate controlled -appreciate cardiology's assistance -con't coreg and dilt -unfortunately no AC at this juncture for afib prophylaxis -monitor  on tele  Asthma with acute exacerbation  -solumedrol 60mg  Q6h -duonebs Q4h -con't close monitoring; mental status not ideal for BiPAP  Uncontrolled hypergylcemia, likely due to steroids -insulin  gtt  AKI -monitor renal function closely -strict I/Os -renally dose meds, avoid nephrotoxic meds  Best Practice (right click and "Reselect all SmartList Selections" daily)   Diet/type: tubefeeds Pain/Anxiety/Delirium protocol Not indicated VAP protocol (if indicated): Not indicated DVT prophylaxis: SCD GI prophylaxis: PPI Glucose control:  insulin gtt.  Central venous access:  N/A Arterial line:  N/A Foley:  N/A Mobility:  bed rest  PT consulted: Yes Code Status:  full code Last date of multidisciplinary goals of care discussion [Per primary team]. No Family at bedside today. Disposition: remains critically ill, will stay in intensive care    Labs   CBC: Recent Labs  Lab 03/23/21 1847 03/23/21 1858 03/25/21 0610 03/26/21 0300 03/26/21 1849 03/27/21 0424  WBC 8.8  --  17.0* 13.1*  --  14.4*  NEUTROABS 4.8  --   --   --   --   --   HGB 15.1* 15.6* 13.8 12.6 11.6* 12.2  HCT 46.0 46.0 41.0 37.9 34.0* 38.0  MCV 92.0  --  88.0 90.7  --  93.1  PLT 233  --  238 181  --  218     Basic Metabolic Panel: Recent Labs  Lab 03/23/21 1847 03/23/21 1858 03/24/21 1650 03/25/21 0610 03/25/21 0921 03/25/21 1627 03/25/21 2022 03/26/21 0300 03/26/21 0907 03/26/21 1506 03/26/21 1849 03/26/21 2041 03/27/21 0424  NA 135 137  --  135   < > 137   < > 148*  148* 150* 151* 158* 157* 160*  K 3.7 3.8  --  3.4*  --   --   --  3.7  --   --  3.9  --  3.6  CL 101 103  --  101  --   --   --  120*  --   --   --   --  129*  CO2 23  --   --  23  --   --   --  22  --   --   --   --  20*  GLUCOSE 217* 214*  --  256*  --   --   --  204*  --   --   --   --  365*  BUN 16 21  --  34*  --   --   --  38*  --   --   --   --  50*  CREATININE 0.90 0.80  --  1.28*  --   --   --  1.02*  --   --   --   --  1.45*  CALCIUM 9.2  --   --  9.1  --   --   --  8.4*  --   --   --   --  8.9  MG  --   --  1.8 2.1  --  2.1  --  2.2  --   --   --   --   --   PHOS  --   --  2.5 3.0  --  3.5  --  3.2  --   --    --   --   --    < > = values in this interval not displayed.    GFR: Estimated Creatinine Clearance: 35.1 mL/min (A) (by C-G formula based on  SCr of 1.45 mg/dL (H)). Recent Labs  Lab 03/23/21 1847 03/25/21 0610 03/26/21 0300 03/27/21 0424  WBC 8.8 17.0* 13.1* 14.4*     This patient is critically ill with multiple organ system failure which requires frequent high complexity decision making, assessment, support, evaluation, and titration of therapies. This was completed through the application of advanced monitoring technologies and extensive interpretation of multiple databases. During this encounter critical care time was devoted to patient care services described in this note for 35 minutes.  Julian Hy, DO 03/27/21 11:42 AM [facility]

## 2021-03-27 NOTE — Progress Notes (Signed)
Inpatient Diabetes Program Recommendations  AACE/ADA: New Consensus Statement on Inpatient Glycemic Control (2015)  Target Ranges:  Prepandial:   less than 140 mg/dL      Peak postprandial:   less than 180 mg/dL (1-2 hours)      Critically ill patients:  140 - 180 mg/dL   Lab Results  Component Value Date   GLUCAP 276 (H) 03/27/2021   HGBA1C 8.8 (H) 03/24/2021    Review of Glycemic Control Results for Danielle Payne, Danielle Payne (MRN [id number]) as of 03/27/2021 09:13  Ref. Range 03/26/2021 23:32 03/27/2021 03:13 03/27/2021 07:51  Glucose-Capillary Latest Ref Range: 70 - 99 mg/dL 306 (H) 316 (H) 276 (H)   Diabetes history: Type 2 DM Outpatient Diabetes medications: Amaryl 4 mg QAM Current orders for Inpatient glycemic control: Lantus 10 units BID, Novolog 0-9 units Q4H Solumedrol 40 mg x 1 Jevity 65 ml/hr  Inpatient Diabetes Program Recommendations:    Consider adding Novolog 3 units q4H for tube feed coverage (to be stopped or held in the event tube feeds are stopped).   Thanks, Bronson Curb, MSN, RNC-OB Diabetes Coordinator [phone] (8a-5p)

## 2021-03-27 NOTE — Progress Notes (Signed)
SLP Cancellation Note  Patient Details Name: Emmanuela Ghazi MRN: [id number] DOB: Jun 04, 1946   Cancelled treatment:        MBS was scheduled for today at 1:00. Events noted of yesterday- this am. Was on Bipap this am, now nasal cannula. Spoke with RN and SLP observed pt from hallway. He states she is tacypneic, belly breathing and will not appropriate for MBS today. Therapist will check tomorrow morning re: pt status.     [name] 03/27/2021, 10:31 AM  [name] Caroli.Ed Risk analyst [phone] Office [phone]

## 2021-03-27 NOTE — Progress Notes (Signed)
STROKE TEAM PROGRESS NOTE   SUBJECTIVE (INTERVAL HISTORY) Patient became obtunded and required biPAP overnight due to asthma exacerbation.    Continues to have global aphasia, mild right facial droop and right arm weakness with improving alertness.   HTS discontinued with serum sodium 160 this am.   If MRI can not be obtained today will repeat [facility] tomorrow AM to evaluate cerebral edema  Blood pressure adequately controlled.  Neurological exam is unchanged OBJECTIVE Temp:  [98.1 F (36.7 C)-101.4 F (38.6 C)] 98.1 F (36.7 C) (06/20 1200) Pulse Rate:  [47-82] 65 (06/20 1400) Cardiac Rhythm: Atrial fibrillation (06/20 0800) Resp:  [15-29] 16 (06/20 1400) BP: (72-185)/(20-172) 125/66 (06/20 1400) SpO2:  [88 %-98 %] 97 % (06/20 1400) FiO2 (%):  [40 %-50 %] 40 % (06/20 0006)  Recent Labs  Lab 03/26/21 2332 03/27/21 0313 03/27/21 0751 03/27/21 1219 03/27/21 1337  GLUCAP 306* 316* 276* 275* 177*   Recent Labs  Lab 03/23/21 1847 03/23/21 1858 03/24/21 1650 03/25/21 0610 03/25/21 0921 03/25/21 1627 03/25/21 2022 03/26/21 0300 03/26/21 0907 03/26/21 1506 03/26/21 1849 03/26/21 2041 03/27/21 0424 03/27/21 1238  NA 135 137  --  135   < > 137   < > 148*  148*   < > 151* 158* 157* 160* 163*  K 3.7 3.8  --  3.4*  --   --   --  3.7  --   --  3.9  --  3.6  --   CL 101 103  --  101  --   --   --  120*  --   --   --   --  129*  --   CO2 23  --   --  23  --   --   --  22  --   --   --   --  20*  --   GLUCOSE 217* 214*  --  256*  --   --   --  204*  --   --   --   --  365*  --   BUN 16 21  --  34*  --   --   --  38*  --   --   --   --  50*  --   CREATININE 0.90 0.80  --  1.28*  --   --   --  1.02*  --   --   --   --  1.45*  --   CALCIUM 9.2  --   --  9.1  --   --   --  8.4*  --   --   --   --  8.9  --   MG  --   --  1.8 2.1  --  2.1  --  2.2  --   --   --   --   --   --   PHOS  --   --  2.5 3.0  --  3.5  --  3.2  --   --   --   --   --   --    < > = values in this interval not  displayed.   Recent Labs  Lab 03/23/21 1847  AST 24  ALT 23  ALKPHOS 88  BILITOT 0.6  PROT 7.4  ALBUMIN 3.8   Recent Labs  Lab 03/23/21 1847 03/23/21 1858 03/25/21 0610 03/26/21 0300 03/26/21 1849 03/27/21 0424  WBC 8.8  --  17.0* 13.1*  --  14.4*  NEUTROABS 4.8  --   --   --   --   --   HGB 15.1* 15.6* 13.8 12.6 11.6* 12.2  HCT 46.0 46.0 41.0 37.9 34.0* 38.0  MCV 92.0  --  88.0 90.7  --  93.1  PLT 233  --  238 181  --  218   No results for input(s): CKTOTAL, CKMB, CKMBINDEX, TROPONINI in the last 168 hours. No results for input(s): LABPROT, INR in the last 72 hours.  No results for input(s): COLORURINE, LABSPEC, [place], GLUCOSEU, HGBUR, BILIRUBINUR, KETONESUR, PROTEINUR, UROBILINOGEN, NITRITE, LEUKOCYTESUR in the last 72 hours.  Invalid input(s): APPERANCEUR      Component Value Date/Time   CHOL 126 03/24/2021 0510   CHOL 191 11/05/2019 0902   TRIG 1,219 (H) 03/24/2021 0510   HDL 27 (L) 03/24/2021 0510   HDL 39 (L) 11/05/2019 0902   CHOLHDL NOT REPORTED DUE TO HIGH TRIGLYCERIDES 03/24/2021 0510   VLDL UNABLE TO CALCULATE IF TRIGLYCERIDE OVER 400 mg/dL 03/24/2021 0510   LDLCALC UNABLE TO CALCULATE IF TRIGLYCERIDE OVER 400 mg/dL 03/24/2021 0510   LDLCALC 119 (H) 11/05/2019 0902   Lab Results  Component Value Date   HGBA1C 8.8 (H) 03/24/2021      Component Value Date/Time   LABOPIA NONE DETECTED 03/24/2021 0126   COCAINSCRNUR NONE DETECTED 03/24/2021 0126   LABBENZ NONE DETECTED 03/24/2021 0126   AMPHETMU NONE DETECTED 03/24/2021 0126   THCU NONE DETECTED 03/24/2021 0126   LABBARB NONE DETECTED 03/24/2021 0126    Recent Labs  Lab 03/23/21 1847  ETH <10    I have personally reviewed the radiological images below and agree with the radiology interpretations.  CT HEAD WO CONTRAST  Result Date: 03/26/2021 CLINICAL DATA:  Stroke follow-up EXAM: CT HEAD WITHOUT CONTRAST TECHNIQUE: Contiguous axial images were obtained from the base of the skull through  the vertex without intravenous contrast. COMPARISON:  CT head 03/25/2021 FINDINGS: Brain: Acute infarct in the left middle frontal lobe unchanged with minimal hemorrhage, unchanged. Acute infarct left parietal lobe with hemorrhagic transformation unchanged from yesterday. Infarct also extends into the left occipital lobe hemorrhagic transformation unchanged. Small amount of hemorrhage in the left temporal lobe likely subarachnoid hemorrhage is unchanged. Slight midline shift to the right unchanged. Negative for hydrocephalus. No new area of infarct. Vascular: Negative for hyperdense vessel Skull: Negative Sinuses/Orbits: Mucosal edema paranasal sinuses.  Negative orbit Other: None IMPRESSION: No change from yesterday. Acute infarct in the left MCA and left PCA territory with hemorrhagic transformation in the left occipital and parietal lobes. Acute infarct left middle frontal gyrus with minimal hemorrhage, unchanged. No new area of hemorrhage or infarct. Electronically Signed   By: Franchot Gallo M.D.   On: 03/26/2021 18:47   CT HEAD WO CONTRAST  Result Date: 03/25/2021 CLINICAL DATA:  75 year old female code stroke presentation status post IV tPA. Left MCA territory infarct with mild hemorrhagic changes. EXAM: CT HEAD WITHOUT CONTRAST TECHNIQUE: Contiguous axial images were obtained from the base of the skull through the vertex without intravenous contrast. COMPARISON:  Head CT 03/24/2021 and earlier. FINDINGS: Brain: Mixed cytotoxic edema and rounded, nodular areas of hemorrhage redemonstrated in the left MCA territory. The several dominant foci of blood in the posterior left frontal and left parietal lobes continuing toward the left occipital pole are stable and size and configuration since yesterday (e.g. Series 4, image 22). Partial effacement of the left lateral ventricle and 2-3 mm of rightward midline shift are stable. No intraventricular hemorrhage.  Probable gyral petechial hemorrhage at the left  temporal operculum is stable on series 4, image 18. No definite extra-axial hemorrhage. Right hemisphere and posterior fossa gray-white matter differentiation is stable and within normal limits. No ventriculomegaly. Basilar cisterns remain patent. Vascular: Calcified atherosclerosis at the skull base. Skull: No acute osseous abnormality identified. Sinuses/Orbits: Visualized paranasal sinuses and mastoids are stable and well aerated. Other: Stable right nasoenteric tube. Mild leftward gaze is stable. Visualized scalp soft tissues are within normal limits. IMPRESSION: 1. Stable Left MCA infarct with hemorrhagic transformation since yesterday. Extension to the Left MCA/PCA watershed territory as before. Partially effaced left lateral ventricle and trace rightward midline shift are stable. No extra-axial extension of blood. 2. No new areas of involvement or new intracranial abnormality. Electronically Signed   By: Genevie Ann M.D.   On: 03/25/2021 05:19   CT HEAD WO CONTRAST  Result Date: 03/24/2021 CLINICAL DATA:  Stroke.  24 hours post tPA EXAM: CT HEAD WITHOUT CONTRAST TECHNIQUE: Contiguous axial images were obtained from the base of the skull through the vertex without intravenous contrast. COMPARISON:  CT head 03/23/2021 FINDINGS: Brain: Interval development of left MCA infarct with hypodensity in the left frontal and parietal lobe. There is also involvement of the left occipital lobe. There is hemorrhagic transformation of the infarct involving the left temporal and parietal lobe with a moderate amount of blood present. Ventricle size normal.  No midline shift. Vascular: Negative for hyperdense vessel Skull: Negative Sinuses/Orbits: Mucosal edema paranasal sinuses. Feeding tube in place. Air-fluid level sphenoid sinus. Negative orbit. Other: None IMPRESSION: Interval development of acute infarct in the left MCA territory with hypodensity in the left temporal and parietal lobe. This also extends into the left  occipital lobe. There has been interval hemorrhagic transformation of the infarct with moderate amount of blood present within the infarction. Dr. Cheral Marker  has been paged to discuss the results. Electronically Signed   By: Franchot Gallo M.D.   On: 03/24/2021 18:30   DG CHEST PORT 1 VIEW  Result Date: 03/26/2021 CLINICAL DATA:  Respiratory distress. EXAM: PORTABLE CHEST 1 VIEW COMPARISON:  Chest radiograph 03/24/2021. FINDINGS: Enteric tube courses inferior to the diaphragm. Stable cardiomegaly status post median sternotomy. Bilateral interstitial pulmonary opacities. No pleural effusion or pneumothorax. Thoracic spine degenerative changes. IMPRESSION: Cardiomegaly with mild interstitial opacities suggestive of edema. Electronically Signed   By: Lovey Newcomer M.D.   On: 03/26/2021 16:56   DG CHEST PORT 1 VIEW  Result Date: 03/24/2021 CLINICAL DATA:  Fevers EXAM: PORTABLE CHEST 1 VIEW COMPARISON:  11/20/2018 FINDINGS: Cardiac shadow is enlarged. Postsurgical changes are again seen. Feeding catheter is noted extending into the stomach. Lungs are hypoinflated. Mild vascular congestion is noted without parenchymal edema. Left basilar opacity is noted which may represent early infiltrate. IMPRESSION: Mild CHF without pulmonary edema. Left basilar opacity which may represent an early infiltrate. Electronically Signed   By: Inez Catalina M.D.   On: 03/24/2021 17:54   DG Abd Portable 1V  Result Date: 03/24/2021 CLINICAL DATA:  Feeding tube placement EXAM: PORTABLE ABDOMEN - 1 VIEW COMPARISON:  Portable exam 1314 hours without priors for comparison FINDINGS: Tip of feeding tube projects over distal gastric antrum. Visualized bowel gas pattern normal. Atherosclerotic calcifications aorta and splenic artery. Prior median sternotomy and LEFT atrial appendage clipping. Subsegmental atelectasis at LEFT lung base. IMPRESSION: Tip of feeding tube projects over distal gastric antrum. Electronically Signed   By: Lavonia Dana  M.D.   On: 03/24/2021 13:40  EEG adult  Result Date: 03/26/2021 Lora Havens, MD     03/26/2021  9:04 PM Patient Name: Danielle Payne MRN: [id number] Epilepsy Attending: Lora Havens Referring Physician/Provider: Dr Amie [place] Date: 03/26/2021 Duration: 24.33 mins Patient history: 75yo F with L MCA stroke, continues to be altered. EEG to evaluate for status epilepticus. Level of alertness: lethargic AEDs during EEG study: None Technical aspects: This EEG was obtained using a 10 lead EEG system positioned circumferentially without any parasagittal coverage (rapid EEG). Computer selected EEG is reviewed as well as background features and all clinically significant events.  Description: EEG showed continuous generalized and lateralized left hemisphere polymorphic 3 to 6 Hz theta-delta slowing.  Hyperventilation and photic stimulation were not performed.   ABNORMALITY - Continuous slow, generalized and lateralized left hemisphere IMPRESSION: This study is  suggestive of cortical dysfunction arising from left hemisphere, likely secondary to underlying stroke as well as severe diffuse encephalopathy, nonspecific etiology. No seizures or epileptiform discharges were seen throughout the recording. Dr Rory Percy was notified. Lora Havens   ECHOCARDIOGRAM COMPLETE  Result Date: 03/24/2021    ECHOCARDIOGRAM REPORT   Patient Name:   Danielle Payne Date of Exam: 03/24/2021 Medical Rec #:  [id number]       Height:       63.0 in Accession #:    [id number]      Weight:       203.7 lb Date of Birth:  Jun 22, 1946      BSA:          1.949 m Patient Age:    74 years        BP:           169/83 mmHg Patient Gender: F               HR:           109 bpm. Exam Location:  Inpatient Procedure: 2D Echo Indications:    stroke  History:        Patient has prior history of Echocardiogram examinations, most                 recent 05/26/2018. CAD, Arrythmias:Atrial Fibrillation; Risk                 Factors:Diabetes and  Hypertension.  Sonographer:    Johny Chess Referring Phys: [id number] HUNTER J COLLINS  Sonographer Comments: Suboptimal apical window. Image acquisition challenging due to uncooperative patient, Image acquisition challenging due to patient body habitus and restraints. kept moving. IMPRESSIONS  1. Left ventricular ejection fraction, by estimation, is 60 to 65%. The left ventricle has normal function. The left ventricle has no regional wall motion abnormalities. Left ventricular diastolic function could not be evaluated.  2. Right ventricular systolic function is normal. The right ventricular size is normal.  3. The mitral valve is grossly normal. No evidence of mitral valve regurgitation. No evidence of mitral stenosis.  4. The aortic valve is grossly normal. Aortic valve regurgitation is mild. No aortic stenosis is present. FINDINGS  Left Ventricle: Left ventricular ejection fraction, by estimation, is 60 to 65%. The left ventricle has normal function. The left ventricle has no regional wall motion abnormalities. The left ventricular internal cavity size was normal in size. There is  borderline left ventricular hypertrophy. Left ventricular diastolic function could not be evaluated due to atrial fibrillation. Left ventricular diastolic function could not be evaluated. Right Ventricle: The right ventricular size is normal. Right vetricular wall thickness was not  well visualized. Right ventricular systolic function is normal. Left Atrium: Left atrial size was normal in size. Right Atrium: Right atrial size was not well visualized. Pericardium: There is no evidence of pericardial effusion. Mitral Valve: The mitral valve is grossly normal. No evidence of mitral valve regurgitation. No evidence of mitral valve stenosis. Tricuspid Valve: The tricuspid valve is grossly normal. Tricuspid valve regurgitation is not demonstrated. Aortic Valve: The aortic valve is grossly normal. Aortic valve regurgitation is mild. No  aortic stenosis is present. Pulmonic Valve: The pulmonic valve was grossly normal. Pulmonic valve regurgitation is not visualized. Aorta: The aortic root and ascending aorta are structurally normal, with no evidence of dilitation. IAS/Shunts: The atrial septum is grossly normal.  LEFT VENTRICLE PLAX 2D LVIDd:         4.60 cm LVIDs:         3.40 cm LV PW:         1.10 cm LV IVS:        1.30 cm LVOT diam:     1.90 cm LVOT Area:     2.84 cm  IVC IVC diam: 2.20 cm LEFT ATRIUM           Index LA diam:      4.40 cm 2.26 cm/m LA Vol (A4C): 60.2 ml 30.89 ml/m   AORTA Ao Root diam: 2.80 cm Ao Asc diam:  3.20 cm  SHUNTS Systemic Diam: 1.90 cm Mertie Moores MD Electronically signed by Mertie Moores MD Signature Date/Time: 03/24/2021/5:19:01 PM    Final    CT HEAD CODE STROKE WO CONTRAST  Result Date: 03/23/2021 CLINICAL DATA:  Code stroke.  Acute neuro deficit.  Aphasia EXAM: CT HEAD WITHOUT CONTRAST TECHNIQUE: Contiguous axial images were obtained from the base of the skull through the vertex without intravenous contrast. COMPARISON:  None. FINDINGS: Brain: Mild atrophy and mild white matter hypodensity bilaterally. Negative for acute infarct, hemorrhage, mass. Vascular: Negative for hyperdense vessel Skull: Negative Sinuses/Orbits: Mild mucosal edema paranasal sinuses. Negative orbit Other: None ASPECTS ([place] Stroke Program Early CT Score) - Ganglionic level infarction (caudate, lentiform nuclei, internal capsule, insula, M1-M3 cortex): 7 - Supraganglionic infarction (M4-M6 cortex): 3 Total score (0-10 with 10 being normal): 10 IMPRESSION: 1. No acute abnormality. 2. ASPECTS is 10 3. Code stroke imaging results were communicated on 03/23/2021 at 6:59 pm to provider [place] via text page Electronically Signed   By: Franchot Gallo M.D.   On: 03/23/2021 19:00   CT ANGIO HEAD CODE STROKE  Result Date: 03/23/2021 CLINICAL DATA:  Expressive aphasia EXAM: CT ANGIOGRAPHY HEAD AND NECK TECHNIQUE: Multidetector CT imaging  of the head and neck was performed using the standard protocol during bolus administration of intravenous contrast. Multiplanar CT image reconstructions and MIPs were obtained to evaluate the vascular anatomy. Carotid stenosis measurements (when applicable) are obtained utilizing NASCET criteria, using the distal internal carotid diameter as the denominator. CONTRAST:  27mL OMNIPAQUE IOHEXOL 350 MG/ML SOLN COMPARISON:  None. FINDINGS: CTA NECK FINDINGS SKELETON: There is no bony spinal canal stenosis. No lytic or blastic lesion. OTHER NECK: Normal pharynx, larynx and major salivary glands. No cervical lymphadenopathy. Unremarkable thyroid gland. UPPER CHEST: No pneumothorax or pleural effusion. No nodules or masses. AORTIC ARCH: There is calcific atherosclerosis of the aortic arch. There is no aneurysm, dissection or hemodynamically significant stenosis of the visualized portion of the aorta. Conventional 3 vessel aortic branching pattern. The visualized proximal subclavian arteries are widely patent. RIGHT CAROTID SYSTEM: No dissection, occlusion or aneurysm. Mild  atherosclerotic calcification at the carotid bifurcation without hemodynamically significant stenosis. LEFT CAROTID SYSTEM: No dissection, occlusion or aneurysm. Mild atherosclerotic calcification at the carotid bifurcation without hemodynamically significant stenosis. VERTEBRAL ARTERIES: Left dominant configuration. Both origins are clearly patent. There is no dissection, occlusion or flow-limiting stenosis to the skull base (V1-V3 segments). CTA HEAD FINDINGS POSTERIOR CIRCULATION: --Vertebral arteries: Normal V4 segments. --Inferior cerebellar arteries: Normal. --Basilar artery: Normal. --Superior cerebellar arteries: Normal. --Posterior cerebral arteries (PCA): Normal. ANTERIOR CIRCULATION: --Intracranial internal carotid arteries: Normal. --Anterior cerebral arteries (ACA): Normal. Both A1 segments are present. Patent anterior communicating artery  (a-comm). --Middle cerebral arteries (MCA): Normal. VENOUS SINUSES: As permitted by contrast timing, patent. ANATOMIC VARIANTS: None Review of the MIP images confirms the above findings. IMPRESSION: 1. No emergent large vessel occlusion or high-grade stenosis of the intracranial arteries. 2. Mild bilateral carotid bifurcation atherosclerosis without hemodynamically significant stenosis by NASCET criteria. Aortic Atherosclerosis (ICD10-I70.0). Electronically Signed   By: Ulyses Jarred M.D.   On: 03/23/2021 19:38   CT ANGIO NECK CODE STROKE  Result Date: 03/23/2021 : CLINICAL DATA:   Expressive aphasia EXAM: CT ANGIOGRAPHY HEAD AND NECK TECHNIQUE: Multidetector CT imaging of the head and neck was performed using the standard protocol during bolus administration of intravenous contrast. Multiplanar CT image reconstructions and MIPs were obtained to evaluate the vascular anatomy. Carotid stenosis measurements (when applicable) are obtained utilizing NASCET criteria, using the distal internal carotid diameter as the denominator. CONTRAST:   51mL OMNIPAQUE IOHEXOL 350 MG/ML SOLN COMPARISON:   None. FINDINGS: CTA NECK FINDINGS SKELETON: There is no bony spinal canal stenosis. No lytic or blastic lesion. OTHER NECK: Normal pharynx, larynx and major salivary glands. No cervical lymphadenopathy. Unremarkable thyroid gland. UPPER CHEST: No pneumothorax or pleural effusion. No nodules or masses. AORTIC ARCH: There is calcific atherosclerosis of the aortic arch. There is no aneurysm, dissection or hemodynamically significant stenosis of the visualized portion of the aorta. Conventional 3 vessel aortic branching pattern. The visualized proximal subclavian arteries are widely patent. RIGHT CAROTID SYSTEM: No dissection, occlusion or aneurysm. Mild atherosclerotic calcification at the carotid bifurcation without hemodynamically significant stenosis. LEFT CAROTID SYSTEM: No dissection, occlusion or aneurysm. Mild atherosclerotic  calcification at the carotid bifurcation without hemodynamically significant stenosis. VERTEBRAL ARTERIES: Left dominant configuration. Both origins are clearly patent. There is no dissection, occlusion or flow-limiting stenosis to the skull base (V1-V3 segments). CTA HEAD FINDINGS POSTERIOR CIRCULATION: --Vertebral arteries: Normal V4 segments. --Inferior cerebellar arteries: Normal. --Basilar artery: Normal. --Superior cerebellar arteries: Normal. --Posterior cerebral arteries (PCA): Normal. ANTERIOR CIRCULATION: --Intracranial internal carotid arteries: Normal. --Anterior cerebral arteries (ACA): Normal. Both A1 segments are present. Patent anterior communicating artery (a-comm). --Middle cerebral arteries (MCA): Normal. VENOUS SINUSES: As permitted by contrast timing, patent. ANATOMIC VARIANTS: None Review of the MIP images confirms the above findings. IMPRESSION: 1. No emergent large vessel occlusion or high-grade stenosis of the intracranial arteries. 2. Mild bilateral carotid bifurcation atherosclerosis without hemodynamically significant stenosis by NASCET criteria. Aortic Atherosclerosis (ICD10-I70.0). Electronically Signed   By: Ulyses Jarred M.D.   On: 03/23/2021 19:45      PHYSICAL EXAM  Temp:  [98.1 F (36.7 C)-101.4 F (38.6 C)] 98.1 F (36.7 C) (06/20 1200) Pulse Rate:  [47-82] 65 (06/20 1400) Resp:  [15-29] 16 (06/20 1400) BP: (72-185)/(20-172) 125/66 (06/20 1400) SpO2:  [88 %-98 %] 97 % (06/20 1400) FiO2 (%):  [40 %-50 %] 40 % (06/20 0006)  General -obese elderly Caucasian lady.  Not in distress.  Ophthalmologic - fundi not visualized  due to noncooperation.  Cardiovascular - irregularly irregular heart rate and rhythm with afib on tele  Neuro - Alert. Sitting up in bed with her eyes open and is attentive to examiner. Global aphasia only able to say ok and yes to every question. Speaks occasional words and short sentences are difficult to understand.  Not follow simple commands  except mouth open, but able to pantomime. Left gaze preference, barely cross midline, tracking on the left, inconsistently blinking to visual threat on the left but not blinking to the right. Right mild facial droop. Tongue protrusion not cooperative. RUE plegic. LUE antigravity. BLE withdraws to noxious with left more than right. UTA sensation and coordination given aphasia. Gait deferred due to right sided weakness.   ASSESSMENT/PLAN Danielle Payne is a 75 y.o. female with history of A. fib off AC, CHF, CAD, hypertension, hyperlipidemia, CAD status post CABG admitted for global aphasia.  tPA given.  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 fib not on AC Resultant global aphasia with right hemiparesis CT no acute abnormality CTA head and neck no LVO MRI pending 2D Echo EF 60 to 65% LDL 80.9 HgbA1c 8.8  HTS for cerebral edema, NA goal 150-155, now off due to serum Na 163 SCDs for VTE prophylaxis  No antithrombotic prior to admission, now on No antithrombotic within 24 hours of tPA due to hemorrhagic transformation Ongoing aggressive stroke risk factor management Therapy recommendations: Pending Disposition: Pending  Chronic A. fib with RVR Bradycardia to 36s Follows with Dr. Oval Linsey on 01/28/20 Home meds including Coreg and Cardizem on board Metoprolol IV as needed Will restart home meds once po access Cardiology consulted for assistance; continuing Coreg + Dilt for rate control  Was on Coumadin before but taken off by patient self "concern that it was stiffening her arteries" "unwilling to take Eliquis or Xarelto because she does not know "what is in them."   taking a natural blood thinner sold over-the-counter upon the recommendation of her herbalist. Have discussed initiating a DOAC after 5 to 7 days for atrial fibrillation with daughter- she is in agreement   Hypertension Unstable, high On Cardene drip, hydralazine prn added.  Cardiology following.  Resume Coreg and Cardizem once p.o. access Long term BP goal normotensive  DM2 with uncontrolled hyperglycemia On Insulin drip per CCM Appreciate CCM assistance A1C 8.8, uncontrolled with goal of less than 7.0 to control stroke risk factor        Asthma Exacerbation Required BiPAP overnight  Management per CCM, appreciated  Hyperlipidemia Home meds: None  LDL 80.9, goal < 70 Consider statin once p.o. access Continue statin at discharge  Dysphagia Did not pass swallow Pending MBS Speech following 1 choking event this morning Cortrak for enteral nutrition         AKI CKD Monitor renal function  Renally does medications  Other Stroke Risk Factors Advanced age Obesity, Body mass index is 35.15 kg/m.  Coronary artery disease status post CABG CHF  Other Active Problems TG 1219, likely contamination from Cleviprex  Patient continues to show significant aphasia and hemiparesis and having mild respiratory difficulties.  Appreciate critical care team help.  Agree with discontinue hypertonic saline.  Management of heart failure and aspiration pneumonia and respiratory status as per critical care team.  No family at the bedside available for discussion.  Continue mobilize out of bed and therapies.This patient is critically ill and at significant risk of neurological worsening, death and care requires constant  monitoring of vital signs, hemodynamics,respiratory and cardiac monitoring, extensive review of multiple databases, frequent neurological assessment, discussion with family, other specialists and medical decision making of high complexity.I have made any additions or clarifications directly to the above note.This critical care time does not reflect procedure time, or teaching time or supervisory time of PA/NP/Med Resident etc but could involve care discussion time.  I spent 30 minutes of neurocritical care time  in the care of  this patient.    Antony Contras, MD  To contact Stroke Continuity provider, please refer to [email or web address] After hours, contact General Neurology

## 2021-03-27 NOTE — Progress Notes (Addendum)
Na 160 on 3% saline. Hold per Dr. Carlis Abbott. Add stop date of 7d to Unasyn and change dose to 3g IV q8.   Onnie Boer, PharmD, BCIDP, AAHIVP, CPP Infectious Disease Pharmacist 03/27/2021 9:28 AM

## 2021-03-27 NOTE — Evaluation (Signed)
Physical Therapy Evaluation Patient Details Name: Danielle Payne MRN: [id number] DOB: 1946/06/24 Today's Date: 03/27/2021   History of Present Illness  The pt is a 75 yo female presenting 6/16 with acute onset dense receptive and expressive aphasia. tPA given on arrival (6/16) Imaging revealed L MCA infarcts with hypodensity in L occipital, frontal, and parietal lobe. Repeat CT on 6/17 with hemorrhagic conversion in L temporal and parietal lobes. PMH includes: Asthma, Afib, CHF, CAD, HTN, and diverticulitis.  Clinical Impression  Pt admitted with above. Presents with decreased functional mobility secondary to decreased arousal/lethargy, R hemiplegia (RUE weaker than RLE), poor sitting balance, decreased cognition, communication impairments. Pt requiring two person max assist for bed mobility and is unable to stand. SpO2 > 90% on 4L O2, however, pt seems to be using accessory inspiratory muscles. Will continue to progress mobility as tolerated.     Follow Up Recommendations SNF    Equipment Recommendations  Wheelchair (measurements PT);Wheelchair cushion (measurements PT);Hospital bed (hoyer lift)    Recommendations for Other Services       Precautions / Restrictions Precautions Precautions: Fall Precaution Comments: O2 requirement Restrictions Weight Bearing Restrictions: No      Mobility  Bed Mobility Overal bed mobility: Needs Assistance Bed Mobility: Rolling;Sidelying to Sit;Sit to Supine Rolling: Max assist Sidelying to sit: Max assist;+2 for physical assistance   Sit to supine: Max assist   General bed mobility comments: Decreased initiation, maxA + 2 for supine > sit, pt elevating legs back into bed on return. MaxA and multimodal cues for rolling to R/L to change bed pad    Transfers Overall transfer level: Needs assistance Equipment used: 2 person hand held assist Transfers: Sit to/from Stand Sit to Stand: Total assist;+2 physical assistance         Lateral/Scoot Transfers: Total assist;+2 physical assistance;+2 safety/equipment General transfer comment: Initiating minimally. Pt unable to clear bottom from bed with bilat knees blocked and having to scoot closer to [facility].  Ambulation/Gait                Stairs            Wheelchair Mobility    Modified Rankin (Stroke Patients Only) Modified Rankin (Stroke Patients Only) Pre-Morbid Rankin Score: No symptoms Modified Rankin: Severe disability     Balance Overall balance assessment: Needs assistance Sitting-balance support: Bilateral upper extremity supported;Feet supported Sitting balance-Leahy Scale: Poor Sitting balance - Comments: pt with minguardA to minA for truncal support edge of bed. Tendency for anterior/right lateral lean but does display balance reactions Postural control: Right lateral lean     Standing balance comment: unable to stand                             Pertinent Vitals/Pain Pain Assessment: Faces Faces Pain Scale: Hurts a little bit Pain Location: reaction to painful stimuli Pain Descriptors / Indicators: Discomfort;Guarding Pain Intervention(s): Monitored during session    Home Living Family/patient expects to be discharged to:: Private residence Living Arrangements: Spouse/significant other               Additional Comments: Pt unable to verblize anything.    Prior Function           Comments: Unsure.     Hand Dominance   Dominant Hand: Right    Extremity/Trunk Assessment   Upper Extremity Assessment Upper Extremity Assessment: RUE deficits/detail;LUE deficits/detail RUE Deficits / Details: 0/5 strength, no response to noxious on RUE  RUE Sensation: decreased light touch RUE Coordination: decreased fine motor;decreased gross motor LUE Deficits / Details: 1/5 strength, poor grip strength LUE Coordination: decreased fine motor;decreased gross motor    Lower Extremity Assessment Lower Extremity  Assessment: Difficult to assess due to impaired cognition;RLE deficits/detail;LLE deficits/detail RLE Deficits / Details: Response to painful stimuli, able to elevate to place back onto bed with return to supine LLE Deficits / Details: WFL    Cervical / Trunk Assessment Cervical / Trunk Assessment: Other exceptions (poor truncal sitting posture)  Communication   Communication: Receptive difficulties;Expressive difficulties  Cognition Arousal/Alertness: Lethargic Behavior During Therapy: Flat affect Overall Cognitive Status: Impaired/Different from baseline Area of Impairment: Following commands;Safety/judgement;Awareness;Problem solving                               General Comments: Pt unable to verbalize anything today with occasional nods, but mostly very lethargic and unsure if pt comprehended. Pt with possible receptive and expressive deficits noted. Pt unable to identify a comb and a wash cloth nor be able to use it for intended purpose.      General Comments General comments (skin integrity, edema, etc.): VSS on 4L O2 >90%.    Exercises Other Exercises Other Exercises: PROM to BUEs Other Exercises: Sitting: lateral leans onto RUE x 3   Assessment/Plan    PT Assessment Patient needs continued PT services  PT Problem List Decreased strength;Decreased activity tolerance;Decreased balance;Decreased mobility;Decreased cognition;Decreased safety awareness;Impaired sensation       PT Treatment Interventions DME instruction;Gait training;Functional mobility training;Therapeutic activities;Therapeutic exercise;Balance training;Neuromuscular re-education;Cognitive remediation;Patient/family education;Wheelchair mobility training    PT Goals (Current goals can be found in the Care Plan section)  Acute Rehab PT Goals Patient Stated Goal: none stated PT Goal Formulation: All assessment and education complete, DC therapy Time For Goal Achievement: 04/10/21 Potential to  Achieve Goals: Fair    Frequency Min 3X/week   Barriers to discharge        Co-evaluation PT/OT/SLP Co-Evaluation/Treatment: Yes Reason for Co-Treatment: Complexity of the patient's impairments (multi-system involvement);Necessary to address cognition/behavior during functional activity;For patient/therapist safety;To address functional/ADL transfers PT goals addressed during session: Mobility/safety with mobility         AM-PAC PT "6 Clicks" Mobility  Outcome Measure Help needed turning from your back to your side while in a flat bed without using bedrails?: Total Help needed moving from lying on your back to sitting on the side of a flat bed without using bedrails?: Total Help needed moving to and from a bed to a chair (including a wheelchair)?: Total Help needed standing up from a chair using your arms (e.g., wheelchair or bedside chair)?: Total Help needed to walk in hospital room?: Total Help needed climbing 3-5 steps with a railing? : Total 6 Click Score: 6    End of Session Equipment Utilized During Treatment: Oxygen Activity Tolerance: Patient limited by lethargy Patient left: in bed;with call bell/phone within reach;with bed alarm set Nurse Communication: Mobility status PT Visit Diagnosis: Other abnormalities of gait and mobility (R26.89);Hemiplegia and hemiparesis Hemiplegia - Right/Left: Right Hemiplegia - dominant/non-dominant: Dominant Hemiplegia - caused by: Cerebral infarction    Time: 9702-6378 PT Time Calculation (min) (ACUTE ONLY): 31 min   Charges:   PT Evaluation $PT Eval Moderate Complexity: 1 Mod          Wyona Almas, PT, DPT Acute Rehabilitation Services Pager [phone] Office [phone]   Deno Etienne 03/27/2021, 4:48 PM

## 2021-03-27 NOTE — Progress Notes (Signed)
Assisted tele visit to patient with daughter. ° °Danielle Payne P, RN  °

## 2021-03-27 NOTE — Evaluation (Signed)
Occupational Therapy Evaluation Patient Details Name: Danielle Payne MRN: [id number] DOB: 05-01-1946 Today's Date: 03/27/2021    History of Present Illness The pt is a 75 yo female presenting 6/16 with acute onset dense receptive and expressive aphasia. tPA given on arrival (6/16) Imaging revealed L MCA infarcts with hypodensity in L occipital, frontal, and parietal lobe. Repeat CT on 6/17 with hemorrhagic conversion in L temporal and parietal lobes. PMH includes: Asthma, Afib, CHF, CAD, HTN, and diverticulitis.   Clinical Impression   Pt PTA: Pt unable to provide PLOF. Spouse not in room. Per chart, pt lives with spouse. Pt currently, limited by decreased arousal with  x2 times 60 secs burst of alertness with positional change from supine to sitting EOB and for sitting to supine change. Pt limited by decreased strength/flaccidity on R UE and decreased cognition as pt appears to nod head to most questions. Pt unable to verbalize anything today. Pt with possible receptive and expressive deficits noted. TotalA for ADL and maxA to totalA +2 for bed mobility with inability to stand today. Vision to continue to be tested due to eyes closed for majority of session. VSS on 4L O2 >90%.  Pt would greatly benefit from continued OT skilled services. OT following acutely.    Follow Up Recommendations  SNF;Supervision/Assistance - 24 hour    Equipment Recommendations  3 in 1 bedside commode    Recommendations for Other Services       Precautions / Restrictions Precautions Precautions: Fall;Other (comment) Precaution Comments: O2 requirement Restrictions Weight Bearing Restrictions: No      Mobility Bed Mobility Overal bed mobility: Needs Assistance Bed Mobility: Rolling;Sidelying to Sit;Sit to Supine Rolling: Max assist Sidelying to sit: Max assist   Sit to supine: Max assist   General bed mobility comments: maxA for cues/ hand placement; pt totalA for scooting towards [facility]     Transfers Overall transfer level: Needs assistance Equipment used: 2 person hand held assist Transfers: Sit to/from Stand;Lateral/Scoot Transfers Sit to Stand: Total assist;+2 physical assistance        Lateral/Scoot Transfers: Total assist;+2 physical assistance;+2 safety/equipment General transfer comment: Pt unable to clear bottom from bed with bilat knees blocked and having to scoot closer to [facility].    Balance Overall balance assessment: Needs assistance Sitting-balance support: Bilateral upper extremity supported;Feet supported Sitting balance-Leahy Scale: Poor Sitting balance - Comments: pt with minguardA to maxA for truncal support in unsupported sitting at EOB       Standing balance comment: unable to stand                           ADL either performed or assessed with clinical judgement   ADL Overall ADL's : Needs assistance/impaired Eating/Feeding: Total assistance   Grooming: Total assistance Grooming Details (indicate cue type and reason): attempting HOH with LUE, but pt unable to arouse enough to grip wash cloth Upper Body Bathing: Total assistance   Lower Body Bathing: Total assistance   Upper Body Dressing : Total assistance   Lower Body Dressing: Total assistance   Toilet Transfer: Total assistance   Toileting- Clothing Manipulation and Hygiene: Total assistance       Functional mobility during ADLs: Maximal assistance;+2 for physical assistance;+2 for safety/equipment;Cueing for safety;Cueing for sequencing General ADL Comments: Pt limited by decreased arousal with  x2 times 60 secs burst of alertness with positional change from supine to sitting EOB and for sitting to supine change. Pt limited by decreased strength/flaccidity  on R UE and decreased cognition as pt appears to nod head to most questions.     Vision Patient Visual Report: Other (comment) (continue to assess; eyes stayed closed for most of session) Vision Assessment?: Vision  impaired- to be further tested in functional context Additional Comments: attempting visual scanning tasks, but unable to arouse fully to achieve task     Perception     Praxis      Pertinent Vitals/Pain Pain Assessment: Faces Faces Pain Scale: Hurts a little bit Pain Location: reaction to painful stimuli Pain Descriptors / Indicators: Discomfort;Guarding Pain Intervention(s): Monitored during session;Repositioned     Hand Dominance Right   Extremity/Trunk Assessment Upper Extremity Assessment Upper Extremity Assessment: RUE deficits/detail;LUE deficits/detail RUE Deficits / Details: 0/5 strength, no response to noxious on RUE RUE Sensation: decreased light touch RUE Coordination: decreased fine motor;decreased gross motor LUE Deficits / Details: 1/5 strength, poor grip strength LUE Coordination: decreased fine motor;decreased gross motor   Lower Extremity Assessment Lower Extremity Assessment: Defer to PT evaluation;RLE deficits/detail;LLE deficits/detail RLE Deficits / Details: response to fainful stimuli LLE Deficits / Details: minimal ROM   Cervical / Trunk Assessment Cervical / Trunk Assessment: Other exceptions (poor truncal sitting posture)   Communication Communication Communication: Receptive difficulties;Expressive difficulties   Cognition Arousal/Alertness: Lethargic Behavior During Therapy: Flat affect Overall Cognitive Status: Impaired/Different from baseline Area of Impairment: Following commands;Safety/judgement;Awareness;Problem solving                               General Comments: Pt unable to verbalize anything today with occasional nods, but mostly very lethargic and unsure if pt comprehended. Pt with possible receptive and expressive deficits noted. Pt unable to identify a comb and a wash cloth nor be able to use it for intended purpose.   General Comments  VSS on 4L O2 >90%.    Exercises Exercises: Other exercises Other  Exercises Other Exercises: PROM to BUEs   Shoulder Instructions      Home Living Family/patient expects to be discharged to:: Private residence Living Arrangements: Spouse/significant other                               Additional Comments: Pt unable to verblize anything.  Lives With: Spouse    Prior Functioning/Environment          Comments: Unsure.        OT Problem List: Decreased strength;Decreased activity tolerance;Impaired balance (sitting and/or standing);Impaired vision/perception;Decreased coordination;Decreased cognition;Decreased safety awareness;Cardiopulmonary status limiting activity;Impaired UE functional use;Pain;Increased edema;Obesity;Impaired sensation;Impaired tone      OT Treatment/Interventions: Self-care/ADL training;Therapeutic exercise;Neuromuscular education;Energy conservation;DME and/or AE instruction;Therapeutic activities;Cognitive remediation/compensation;Visual/perceptual remediation/compensation;Patient/family education;Balance training    OT Goals(Current goals can be found in the care plan section) Acute Rehab OT Goals Patient Stated Goal: none stated OT Goal Formulation: Patient unable to participate in goal setting Time For Goal Achievement: 04/10/21 Potential to Achieve Goals: Good ADL Goals Pt Will Perform Grooming: with min assist;sitting Pt Will Transfer to Toilet: with max assist;stand pivot transfer;bedside commode Pt/caregiver will Perform Home Exercise Program: Increased strength;Both right and left upper extremity;With minimal assist;With written HEP provided Additional ADL Goal #1: Pt will identify 3 ADL items and use items for intreded purpose with 100% accuracy in 3/5 trials. Additional ADL Goal #2: Pt will increase to following 1 step commands 50% of the time in 3/5 trials.  OT Frequency: Min 2X/week  Barriers to D/C:            Co-evaluation              AM-PAC OT "6 Clicks" Daily Activity      Outcome Measure Help from another person eating meals?: Total Help from another person taking care of personal grooming?: Total Help from another person toileting, which includes using toliet, bedpan, or urinal?: Total Help from another person bathing (including washing, rinsing, drying)?: Total Help from another person to put on and taking off regular upper body clothing?: Total Help from another person to put on and taking off regular lower body clothing?: Total 6 Click Score: 6   End of Session Equipment Utilized During Treatment: Gait belt;Oxygen Nurse Communication: Mobility status;Other (comment) (posey belt)  Activity Tolerance: Treatment limited secondary to medical complications (Comment) Patient left: in bed;with call bell/phone within reach;with bed alarm set;Other (comment) (posey belt applied, but  per RN, did not need to be tied. bed alarm on)  OT Visit Diagnosis: Unsteadiness on feet (R26.81);Muscle weakness (generalized) (M62.81);Other symptoms and signs involving cognitive function;Hemiplegia and hemiparesis                Time: 2820-8138 OT Time Calculation (min): 31 min Charges:  OT General Charges $OT Visit: 1 Visit OT Evaluation $OT Eval Moderate Complexity: 1 Mod  Jefferey Pica, OTR/L Acute Rehabilitation Services Pager: [phone] Office: [phone]  Jefferey Pica 03/27/2021, 3:48 PM

## 2021-03-28 ENCOUNTER — Inpatient Hospital Stay (HOSPITAL_COMMUNITY): Payer: Medicare Other

## 2021-03-28 DIAGNOSIS — J9601 Acute respiratory failure with hypoxia: Secondary | ICD-10-CM

## 2021-03-28 DIAGNOSIS — I4821 Permanent atrial fibrillation: Secondary | ICD-10-CM | POA: Diagnosis not present

## 2021-03-28 DIAGNOSIS — R739 Hyperglycemia, unspecified: Secondary | ICD-10-CM

## 2021-03-28 DIAGNOSIS — J4551 Severe persistent asthma with (acute) exacerbation: Secondary | ICD-10-CM | POA: Diagnosis not present

## 2021-03-28 DIAGNOSIS — E870 Hyperosmolality and hypernatremia: Secondary | ICD-10-CM | POA: Diagnosis not present

## 2021-03-28 DIAGNOSIS — G9340 Encephalopathy, unspecified: Secondary | ICD-10-CM | POA: Diagnosis not present

## 2021-03-28 LAB — URINALYSIS, ROUTINE W REFLEX MICROSCOPIC
Bilirubin Urine: NEGATIVE
Glucose, UA: 50 mg/dL — AB
Hgb urine dipstick: NEGATIVE
Ketones, ur: 5 mg/dL — AB
Leukocytes,Ua: NEGATIVE
Nitrite: NEGATIVE
Protein, ur: NEGATIVE mg/dL
Specific Gravity, Urine: 1.024 (ref 1.005–1.030)
pH: 5 (ref 5.0–8.0)

## 2021-03-28 LAB — BASIC METABOLIC PANEL
BUN: 56 mg/dL — ABNORMAL HIGH (ref 8–23)
CO2: 21 mmol/L — ABNORMAL LOW (ref 22–32)
Calcium: 9.3 mg/dL (ref 8.9–10.3)
Chloride: >130 mmol/L (ref 98–111)
Creatinine, Ser: 1.22 mg/dL — ABNORMAL HIGH (ref 0.44–1.00)
GFR, Estimated: 46 mL/min — ABNORMAL LOW (ref >60)
Glucose, Bld: 188 mg/dL — ABNORMAL HIGH (ref 70–99)
Potassium: 3.4 mmol/L — ABNORMAL LOW (ref 3.5–5.1)
Sodium: 163 mmol/L (ref 135–145)

## 2021-03-28 LAB — GLUCOSE, CAPILLARY
Glucose-Capillary: 136 mg/dL — ABNORMAL HIGH (ref 70–99)
Glucose-Capillary: 138 mg/dL — ABNORMAL HIGH (ref 70–99)
Glucose-Capillary: 151 mg/dL — ABNORMAL HIGH (ref 70–99)
Glucose-Capillary: 156 mg/dL — ABNORMAL HIGH (ref 70–99)
Glucose-Capillary: 157 mg/dL — ABNORMAL HIGH (ref 70–99)
Glucose-Capillary: 159 mg/dL — ABNORMAL HIGH (ref 70–99)
Glucose-Capillary: 163 mg/dL — ABNORMAL HIGH (ref 70–99)
Glucose-Capillary: 164 mg/dL — ABNORMAL HIGH (ref 70–99)
Glucose-Capillary: 167 mg/dL — ABNORMAL HIGH (ref 70–99)
Glucose-Capillary: 169 mg/dL — ABNORMAL HIGH (ref 70–99)
Glucose-Capillary: 172 mg/dL — ABNORMAL HIGH (ref 70–99)
Glucose-Capillary: 173 mg/dL — ABNORMAL HIGH (ref 70–99)
Glucose-Capillary: 174 mg/dL — ABNORMAL HIGH (ref 70–99)
Glucose-Capillary: 178 mg/dL — ABNORMAL HIGH (ref 70–99)
Glucose-Capillary: 188 mg/dL — ABNORMAL HIGH (ref 70–99)
Glucose-Capillary: 194 mg/dL — ABNORMAL HIGH (ref 70–99)
Glucose-Capillary: 196 mg/dL — ABNORMAL HIGH (ref 70–99)

## 2021-03-28 LAB — CBC
HCT: 37.2 % (ref 36.0–46.0)
Hemoglobin: 12.1 g/dL (ref 12.0–15.0)
MCH: 30.3 pg (ref 26.0–34.0)
MCHC: 32.5 g/dL (ref 30.0–36.0)
MCV: 93 fL (ref 80.0–100.0)
Platelets: 245 10*3/uL (ref 150–400)
RBC: 4 MIL/uL (ref 3.87–5.11)
RDW: 14.3 % (ref 11.5–15.5)
WBC: 20.3 10*3/uL — ABNORMAL HIGH (ref 4.0–10.5)
nRBC: 0 % (ref 0.0–0.2)

## 2021-03-28 LAB — BRAIN NATRIURETIC PEPTIDE: B Natriuretic Peptide: 355.1 pg/mL — ABNORMAL HIGH (ref 0.0–100.0)

## 2021-03-28 LAB — SODIUM: Sodium: 165 mmol/L (ref 135–145)

## 2021-03-28 MED ORDER — LORAZEPAM 2 MG/ML IJ SOLN
2.0000 mg | Freq: Once | INTRAMUSCULAR | Status: AC
  Administered 2021-03-28: 2 mg via INTRAVENOUS
  Filled 2021-03-28: qty 1

## 2021-03-28 MED ORDER — POTASSIUM CHLORIDE 20 MEQ PO PACK
40.0000 meq | PACK | Freq: Once | ORAL | Status: AC
  Administered 2021-03-28: 40 meq
  Filled 2021-03-28: qty 2

## 2021-03-28 MED ORDER — FREE WATER
100.0000 mL | Status: DC
  Administered 2021-03-28 – 2021-03-29 (×9): 100 mL

## 2021-03-28 MED ORDER — POTASSIUM CHLORIDE 20 MEQ PO PACK
20.0000 meq | PACK | Freq: Once | ORAL | Status: AC
  Administered 2021-03-28: 20 meq
  Filled 2021-03-28: qty 1

## 2021-03-28 NOTE — Progress Notes (Signed)
Patient's two rings removed since her fingers have swelled. Rings placed in pink cup with patient label. They were given to daughter who laid them next to her glasses on the side table. Jiah Bari, Rande Brunt, RN

## 2021-03-28 NOTE — Progress Notes (Signed)
Initial Nutrition Assessment  DOCUMENTATION CODES:   Obesity unspecified  INTERVENTION:   Tube feeding via Cortrak tube:  - Jevity 1.2 @ 65 ml/hr (1560 ml/day) - ProSource TF 45 ml daily  Tube feeding regimen provides 1912 kcal, 98 grams of protein, and 1259 ml of H2O.   100 ml free water every 2 hours Total free water: 2459 ml   NUTRITION DIAGNOSIS:   Inadequate oral intake related to inability to eat as evidenced by NPO status. Ongoing.   GOAL:   Patient will meet greater than or equal to 90% of their needs Met with TF.   MONITOR:   Diet advancement, Labs, Weight trends, TF tolerance, I & O's  REASON FOR ASSESSMENT:   Consult Enteral/tube feeding initiation and management  ASSESSMENT:   75 year old female who presented to the ED on 6/16 with acute onset of dense receptive and expressive aphasia. PMH of atrial fibrillation, asthma, CHF, CAD s/p CABG, HTN.  Pt admitted with stroke. MRI overnight unsuccessful due to agitation but remains sleepy after meds given for MRI. Plan for MBS once ready.  Noted pt with uncontrolled DM PTA with A1C of 8.8.   6/17 cortrak placed; tip in gastric antrum  Medications reviewed and include: melatonin, solumedrol, IV protonix, senna, insulin drip  Labs reviewed: ionized calcium 1.13, TG 1219 CBG's: 157-194  I/O's: +11 L since admit UOP: 400 ml    Diet Order:   Diet Order             Diet NPO time specified  Diet effective now                   EDUCATION NEEDS:   Not appropriate for education at this time  Skin:  Skin Assessment: Reviewed RN Assessment  Last BM:  6/21  Height:   Ht Readings from Last 1 Encounters:  03/25/21 5' 3" (1.6 m)    Weight:   Wt Readings from Last 1 Encounters:  03/28/21 90 kg    BMI:  Body mass index is 35.15 kg/m.  Estimated Nutritional Needs:   Kcal:  1750-1950  Protein:  85-105 grams  Fluid:  1.7-1.9 L  Burel Kahre P., RD, LDN, CNSC See AMiON for contact  information

## 2021-03-28 NOTE — Progress Notes (Signed)
Brain MRI was attempted. Axial DWI successful but pt started kicking and flailing arms and we were unable to continue at that point. Scan not completed.

## 2021-03-28 NOTE — Progress Notes (Signed)
Daughter and husband updated at bedside. RN Judson Roch present.  Julian Hy, DO 03/28/21 1:40 PM [facility]

## 2021-03-28 NOTE — Progress Notes (Addendum)
STROKE TEAM PROGRESS NOTE   SUBJECTIVE (INTERVAL HISTORY)  No acute events overnight, she continues to have global aphasia, mild right facial droop and right arm weakness with improving alertness.  Neurological exam is unchanged.  Vital signs are stable. Repeat CTH today showed evolving left cerebral infarcts with hemorrhagic conversion.  No new hemorrhage or any worsening of mass-effect Hypertonic saline drip has been discontinued with serum sodium remains elevated Continuing to monitor sodium, elevated with Na in the 160 range. Started her on free water 100 ml Q2 to help with increased sodium. She remains in mild respiratory distress with bilateral wheezing she is on oxygen and bronchodilators. OBJECTIVE Temp:  [97.6 F (36.4 C)-98.6 F (37 C)] 97.6 F (36.4 C) (06/21 1200) Pulse Rate:  [66-139] 75 (06/21 1300) Cardiac Rhythm: Atrial fibrillation (06/21 0800) Resp:  [11-26] 19 (06/21 1300) BP: (106-174)/(53-120) 162/79 (06/21 1300) SpO2:  [89 %-97 %] 96 % (06/21 1300) Weight:  [90 kg] 90 kg (06/21 0500)  Recent Labs  Lab 03/28/21 0849 03/28/21 1021 03/28/21 1122 03/28/21 1232 03/28/21 1322  GLUCAP 188* 194* 173* 157* 169*   Recent Labs  Lab 03/23/21 1847 03/23/21 1858 03/24/21 1650 03/25/21 0610 03/25/21 0921 03/25/21 1627 03/25/21 2022 03/26/21 0300 03/26/21 0907 03/26/21 1849 03/26/21 2041 03/27/21 0424 03/27/21 1238 03/27/21 1808 03/27/21 2031 03/28/21 0240 03/28/21 0921  NA 135 137  --  135   < > 137   < > 148*  148*   < > 158*   < > 160* 163* 165* 165* 163* 165*  K 3.7 3.8  --  3.4*  --   --   --  3.7  --  3.9  --  3.6  --   --   --  3.4*  --   CL 101 103  --  101  --   --   --  120*  --   --   --  129*  --   --   --  >130*  --   CO2 23  --   --  23  --   --   --  22  --   --   --  20*  --   --   --  21*  --   GLUCOSE 217* 214*  --  256*  --   --   --  204*  --   --   --  365*  --   --   --  188*  --   BUN 16 21  --  34*  --   --   --  38*  --   --   --   50*  --   --   --  56*  --   CREATININE 0.90 0.80  --  1.28*  --   --   --  1.02*  --   --   --  1.45*  --   --   --  1.22*  --   CALCIUM 9.2  --   --  9.1  --   --   --  8.4*  --   --   --  8.9  --   --   --  9.3  --   MG  --   --  1.8 2.1  --  2.1  --  2.2  --   --   --   --   --   --   --   --   --  PHOS  --   --  2.5 3.0  --  3.5  --  3.2  --   --   --   --   --   --   --   --   --    < > = values in this interval not displayed.   Recent Labs  Lab 03/23/21 1847  AST 24  ALT 23  ALKPHOS 88  BILITOT 0.6  PROT 7.4  ALBUMIN 3.8   Recent Labs  Lab 03/23/21 1847 03/23/21 1858 03/25/21 0610 03/26/21 0300 03/26/21 1849 03/27/21 0424 03/28/21 0240  WBC 8.8  --  17.0* 13.1*  --  14.4* 20.3*  NEUTROABS 4.8  --   --   --   --   --   --   HGB 15.1*   < > 13.8 12.6 11.6* 12.2 12.1  HCT 46.0   < > 41.0 37.9 34.0* 38.0 37.2  MCV 92.0  --  88.0 90.7  --  93.1 93.0  PLT 233  --  238 181  --  218 245   < > = values in this interval not displayed.   No results for input(s): CKTOTAL, CKMB, CKMBINDEX, TROPONINI in the last 168 hours. No results for input(s): LABPROT, INR in the last 72 hours.  Recent Labs    03/28/21 0837  COLORURINE YELLOW  LABSPEC 1.024  PHURINE 5.0  GLUCOSEU 50*  HGBUR NEGATIVE  BILIRUBINUR NEGATIVE  KETONESUR 5*  PROTEINUR NEGATIVE  NITRITE NEGATIVE  LEUKOCYTESUR NEGATIVE        Component Value Date/Time   CHOL 126 03/24/2021 0510   CHOL 191 11/05/2019 0902   TRIG 1,219 (H) 03/24/2021 0510   HDL 27 (L) 03/24/2021 0510   HDL 39 (L) 11/05/2019 0902   CHOLHDL NOT REPORTED DUE TO HIGH TRIGLYCERIDES 03/24/2021 0510   VLDL UNABLE TO CALCULATE IF TRIGLYCERIDE OVER 400 mg/dL 03/24/2021 0510   LDLCALC UNABLE TO CALCULATE IF TRIGLYCERIDE OVER 400 mg/dL 03/24/2021 0510   LDLCALC 119 (H) 11/05/2019 0902   Lab Results  Component Value Date   HGBA1C 8.8 (H) 03/24/2021      Component Value Date/Time   LABOPIA NONE DETECTED 03/24/2021 0126   COCAINSCRNUR  NONE DETECTED 03/24/2021 0126   LABBENZ NONE DETECTED 03/24/2021 0126   AMPHETMU NONE DETECTED 03/24/2021 0126   THCU NONE DETECTED 03/24/2021 0126   LABBARB NONE DETECTED 03/24/2021 0126    Recent Labs  Lab 03/23/21 1847  ETH <10    I have personally reviewed the radiological images below and agree with the radiology interpretations.  CT Head Wo Contrast  Result Date: 03/28/2021 CLINICAL DATA:  Stroke, follow-up EXAM: CT HEAD WITHOUT CONTRAST TECHNIQUE: Contiguous axial images were obtained from the base of the skull through the vertex without intravenous contrast. COMPARISON:  03/26/2021 FINDINGS: Brain: Areas of evolving recent infarction with hemorrhagic conversion again identified in the left frontal, parietal, and lateral occipital lobes. Similar associated mass effect remains mild. Unchanged small left temporal, likely subarachnoid hemorrhage. No new hemorrhage or loss of gray-white differentiation. Ventricles are stable in size. Vascular: No new findings. Skull: Calvarium is unremarkable. Sinuses/Orbits: No acute finding. Other: None. 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 Unchanged small left temporal, likely subarachnoid hemorrhage. No new hemorrhage or worsening mass effect. Electronically Signed   By: Macy Mis M.D.   On: 03/28/2021 10:43   CT HEAD WO CONTRAST  Result Date: 03/26/2021 CLINICAL DATA:  Stroke follow-up EXAM: CT HEAD WITHOUT CONTRAST TECHNIQUE: Contiguous  axial images were obtained from the base of the skull through the vertex without intravenous contrast. COMPARISON:  CT head 03/25/2021 FINDINGS: Brain: Acute infarct in the left middle frontal lobe unchanged with minimal hemorrhage, unchanged. Acute infarct left parietal lobe with hemorrhagic transformation unchanged from yesterday. Infarct also extends into the left occipital lobe hemorrhagic transformation unchanged. Small amount of hemorrhage in the left temporal lobe likely  subarachnoid hemorrhage is unchanged. Slight midline shift to the right unchanged. Negative for hydrocephalus. No new area of infarct. Vascular: Negative for hyperdense vessel Skull: Negative Sinuses/Orbits: Mucosal edema paranasal sinuses.  Negative orbit Other: None IMPRESSION: No change from yesterday. Acute infarct in the left MCA and left PCA territory with hemorrhagic transformation in the left occipital and parietal lobes. Acute infarct left middle frontal gyrus with minimal hemorrhage, unchanged. No new area of hemorrhage or infarct. Electronically Signed   By: Franchot Gallo M.D.   On: 03/26/2021 18:47   CT HEAD WO CONTRAST  Result Date: 03/25/2021 CLINICAL DATA:  75 year old female code stroke presentation status post IV tPA. Left MCA territory infarct with mild hemorrhagic changes. EXAM: CT HEAD WITHOUT CONTRAST TECHNIQUE: Contiguous axial images were obtained from the base of the skull through the vertex without intravenous contrast. COMPARISON:  Head CT 03/24/2021 and earlier. FINDINGS: Brain: Mixed cytotoxic edema and rounded, nodular areas of hemorrhage redemonstrated in the left MCA territory. The several dominant foci of blood in the posterior left frontal and left parietal lobes continuing toward the left occipital pole are stable and size and configuration since yesterday (e.g. Series 4, image 22). Partial effacement of the left lateral ventricle and 2-3 mm of rightward midline shift are stable. No intraventricular hemorrhage. Probable gyral petechial hemorrhage at the left temporal operculum is stable on series 4, image 18. No definite extra-axial hemorrhage. Right hemisphere and posterior fossa gray-white matter differentiation is stable and within normal limits. No ventriculomegaly. Basilar cisterns remain patent. Vascular: Calcified atherosclerosis at the skull base. Skull: No acute osseous abnormality identified. Sinuses/Orbits: Visualized paranasal sinuses and mastoids are stable and  well aerated. Other: Stable right nasoenteric tube. Mild leftward gaze is stable. Visualized scalp soft tissues are within normal limits. IMPRESSION: 1. Stable Left MCA infarct with hemorrhagic transformation since yesterday. Extension to the Left MCA/PCA watershed territory as before. Partially effaced left lateral ventricle and trace rightward midline shift are stable. No extra-axial extension of blood. 2. No new areas of involvement or new intracranial abnormality. Electronically Signed   By: Genevie Ann M.D.   On: 03/25/2021 05:19   CT HEAD WO CONTRAST  Result Date: 03/24/2021 CLINICAL DATA:  Stroke.  24 hours post tPA EXAM: CT HEAD WITHOUT CONTRAST TECHNIQUE: Contiguous axial images were obtained from the base of the skull through the vertex without intravenous contrast. COMPARISON:  CT head 03/23/2021 FINDINGS: Brain: Interval development of left MCA infarct with hypodensity in the left frontal and parietal lobe. There is also involvement of the left occipital lobe. There is hemorrhagic transformation of the infarct involving the left temporal and parietal lobe with a moderate amount of blood present. Ventricle size normal.  No midline shift. Vascular: Negative for hyperdense vessel Skull: Negative Sinuses/Orbits: Mucosal edema paranasal sinuses. Feeding tube in place. Air-fluid level sphenoid sinus. Negative orbit. Other: None IMPRESSION: Interval development of acute infarct in the left MCA territory with hypodensity in the left temporal and parietal lobe. This also extends into the left occipital lobe. There has been interval hemorrhagic transformation of the infarct with moderate amount  of blood present within the infarction. Dr. Cheral Marker  has been paged to discuss the results. Electronically Signed   By: Franchot Gallo M.D.   On: 03/24/2021 18:30   DG CHEST PORT 1 VIEW  Result Date: 03/28/2021 CLINICAL DATA:  Hypoxia.  Stroke. EXAM: PORTABLE CHEST 1 VIEW COMPARISON:  03/26/2021. 11/20/2018. FINDINGS:  Feeding tube noted with tip below left hemidiaphragm. Cardiomegaly again noted. Left atrial appendage clip in stable position. Pulmonary venous congestion. Bilateral interstitial prominence again noted. Findings suggest CHF. Low lung volumes. Small left pleural effusion may be present. No pneumothorax. Surgical clips left upper quadrant. IMPRESSION: 1.  Feeding tube noted with tip below left hemidiaphragm. 2. Cardiomegaly. Left atrial appendage clip in stable position. Bilateral interstitial prominence. Findings suggest congestive heart failure with interstitial edema. Small left pleural effusion may be present. Electronically Signed   By: Marcello Moores  Register   On: 03/28/2021 11:13   DG CHEST PORT 1 VIEW  Result Date: 03/26/2021 CLINICAL DATA:  Respiratory distress. EXAM: PORTABLE CHEST 1 VIEW COMPARISON:  Chest radiograph 03/24/2021. FINDINGS: Enteric tube courses inferior to the diaphragm. Stable cardiomegaly status post median sternotomy. Bilateral interstitial pulmonary opacities. No pleural effusion or pneumothorax. Thoracic spine degenerative changes. IMPRESSION: Cardiomegaly with mild interstitial opacities suggestive of edema. Electronically Signed   By: Lovey Newcomer M.D.   On: 03/26/2021 16:56   DG CHEST PORT 1 VIEW  Result Date: 03/24/2021 CLINICAL DATA:  Fevers EXAM: PORTABLE CHEST 1 VIEW COMPARISON:  11/20/2018 FINDINGS: Cardiac shadow is enlarged. Postsurgical changes are again seen. Feeding catheter is noted extending into the stomach. Lungs are hypoinflated. Mild vascular congestion is noted without parenchymal edema. Left basilar opacity is noted which may represent early infiltrate. IMPRESSION: Mild CHF without pulmonary edema. Left basilar opacity which may represent an early infiltrate. Electronically Signed   By: Inez Catalina M.D.   On: 03/24/2021 17:54   DG Abd Portable 1V  Result Date: 03/24/2021 CLINICAL DATA:  Feeding tube placement EXAM: PORTABLE ABDOMEN - 1 VIEW COMPARISON:   Portable exam 1314 hours without priors for comparison FINDINGS: Tip of feeding tube projects over distal gastric antrum. Visualized bowel gas pattern normal. Atherosclerotic calcifications aorta and splenic artery. Prior median sternotomy and LEFT atrial appendage clipping. Subsegmental atelectasis at LEFT lung base. IMPRESSION: Tip of feeding tube projects over distal gastric antrum. Electronically Signed   By: Lavonia Dana M.D.   On: 03/24/2021 13:40   EEG adult  Result Date: 03/26/2021 Lora Havens, MD     03/26/2021  9:04 PM Patient Name: Danielle Payne MRN: [id number] Epilepsy Attending: Lora Havens Referring Physician/Provider: Dr Amie [place] Date: 03/26/2021 Duration: 24.33 mins Patient history: 75yo F with L MCA stroke, continues to be altered. EEG to evaluate for status epilepticus. Level of alertness: lethargic AEDs during EEG study: None Technical aspects: This EEG was obtained using a 10 lead EEG system positioned circumferentially without any parasagittal coverage (rapid EEG). Computer selected EEG is reviewed as well as background features and all clinically significant events.  Description: EEG showed continuous generalized and lateralized left hemisphere polymorphic 3 to 6 Hz theta-delta slowing.  Hyperventilation and photic stimulation were not performed.   ABNORMALITY - Continuous slow, generalized and lateralized left hemisphere IMPRESSION: This study is  suggestive of cortical dysfunction arising from left hemisphere, likely secondary to underlying stroke as well as severe diffuse encephalopathy, nonspecific etiology. No seizures or epileptiform discharges were seen throughout the recording. Dr Rory Percy was notified. Danielle Payne   ECHOCARDIOGRAM COMPLETE  Result Date: 03/24/2021    ECHOCARDIOGRAM REPORT   Patient Name:   Danielle Payne Date of Exam: 03/24/2021 Medical Rec #:  [id number]       Height:       63.0 in Accession #:    [id number]      Weight:       203.7 lb Date of  Birth:  02/21/1946      BSA:          1.949 m Patient Age:    75 years        BP:           169/83 mmHg Patient Gender: F               HR:           109 bpm. Exam Location:  Inpatient Procedure: 2D Echo Indications:    stroke  History:        Patient has prior history of Echocardiogram examinations, most                 recent 05/26/2018. CAD, Arrythmias:Atrial Fibrillation; Risk                 Factors:Diabetes and Hypertension.  Sonographer:    Johny Chess Referring Phys: [id number] HUNTER J COLLINS  Sonographer Comments: Suboptimal apical window. Image acquisition challenging due to uncooperative patient, Image acquisition challenging due to patient body habitus and restraints. kept moving. IMPRESSIONS  1. Left ventricular ejection fraction, by estimation, is 60 to 65%. The left ventricle has normal function. The left ventricle has no regional wall motion abnormalities. Left ventricular diastolic function could not be evaluated.  2. Right ventricular systolic function is normal. The right ventricular size is normal.  3. The mitral valve is grossly normal. No evidence of mitral valve regurgitation. No evidence of mitral stenosis.  4. The aortic valve is grossly normal. Aortic valve regurgitation is mild. No aortic stenosis is present. FINDINGS  Left Ventricle: Left ventricular ejection fraction, by estimation, is 60 to 65%. The left ventricle has normal function. The left ventricle has no regional wall motion abnormalities. The left ventricular internal cavity size was normal in size. There is  borderline left ventricular hypertrophy. Left ventricular diastolic function could not be evaluated due to atrial fibrillation. Left ventricular diastolic function could not be evaluated. Right Ventricle: The right ventricular size is normal. Right vetricular wall thickness was not well visualized. Right ventricular systolic function is normal. Left Atrium: Left atrial size was normal in size. Right Atrium: Right atrial  size was not well visualized. Pericardium: There is no evidence of pericardial effusion. Mitral Valve: The mitral valve is grossly normal. No evidence of mitral valve regurgitation. No evidence of mitral valve stenosis. Tricuspid Valve: The tricuspid valve is grossly normal. Tricuspid valve regurgitation is not demonstrated. Aortic Valve: The aortic valve is grossly normal. Aortic valve regurgitation is mild. No aortic stenosis is present. Pulmonic Valve: The pulmonic valve was grossly normal. Pulmonic valve regurgitation is not visualized. Aorta: The aortic root and ascending aorta are structurally normal, with no evidence of dilitation. IAS/Shunts: The atrial septum is grossly normal.  LEFT VENTRICLE PLAX 2D LVIDd:         4.60 cm LVIDs:         3.40 cm LV PW:         1.10 cm LV IVS:        1.30 cm LVOT diam:     1.90 cm LVOT Area:  2.84 cm  IVC IVC diam: 2.20 cm LEFT ATRIUM           Index LA diam:      4.40 cm 2.26 cm/m LA Vol (A4C): 60.2 ml 30.89 ml/m   AORTA Ao Root diam: 2.80 cm Ao Asc diam:  3.20 cm  SHUNTS Systemic Diam: 1.90 cm Mertie Moores MD Electronically signed by Mertie Moores MD Signature Date/Time: 03/24/2021/5:19:01 PM    Final    CT HEAD CODE STROKE WO CONTRAST  Result Date: 03/23/2021 CLINICAL DATA:  Code stroke.  Acute neuro deficit.  Aphasia EXAM: CT HEAD WITHOUT CONTRAST TECHNIQUE: Contiguous axial images were obtained from the base of the skull through the vertex without intravenous contrast. COMPARISON:  None. FINDINGS: Brain: Mild atrophy and mild white matter hypodensity bilaterally. Negative for acute infarct, hemorrhage, mass. Vascular: Negative for hyperdense vessel Skull: Negative Sinuses/Orbits: Mild mucosal edema paranasal sinuses. Negative orbit Other: None ASPECTS ([place] Stroke Program Early CT Score) - Ganglionic level infarction (caudate, lentiform nuclei, internal capsule, insula, M1-M3 cortex): 7 - Supraganglionic infarction (M4-M6 cortex): 3 Total score (0-10 with  10 being normal): 10 IMPRESSION: 1. No acute abnormality. 2. ASPECTS is 10 3. Code stroke imaging results were communicated on 03/23/2021 at 6:59 pm to provider [place] via text page Electronically Signed   By: Franchot Gallo M.D.   On: 03/23/2021 19:00   CT ANGIO HEAD CODE STROKE  Result Date: 03/23/2021 CLINICAL DATA:  Expressive aphasia EXAM: CT ANGIOGRAPHY HEAD AND NECK TECHNIQUE: Multidetector CT imaging of the head and neck was performed using the standard protocol during bolus administration of intravenous contrast. Multiplanar CT image reconstructions and MIPs were obtained to evaluate the vascular anatomy. Carotid stenosis measurements (when applicable) are obtained utilizing NASCET criteria, using the distal internal carotid diameter as the denominator. CONTRAST:  68mL OMNIPAQUE IOHEXOL 350 MG/ML SOLN COMPARISON:  None. FINDINGS: CTA NECK FINDINGS SKELETON: There is no bony spinal canal stenosis. No lytic or blastic lesion. OTHER NECK: Normal pharynx, larynx and major salivary glands. No cervical lymphadenopathy. Unremarkable thyroid gland. UPPER CHEST: No pneumothorax or pleural effusion. No nodules or masses. AORTIC ARCH: There is calcific atherosclerosis of the aortic arch. There is no aneurysm, dissection or hemodynamically significant stenosis of the visualized portion of the aorta. Conventional 3 vessel aortic branching pattern. The visualized proximal subclavian arteries are widely patent. RIGHT CAROTID SYSTEM: No dissection, occlusion or aneurysm. Mild atherosclerotic calcification at the carotid bifurcation without hemodynamically significant stenosis. LEFT CAROTID SYSTEM: No dissection, occlusion or aneurysm. Mild atherosclerotic calcification at the carotid bifurcation without hemodynamically significant stenosis. VERTEBRAL ARTERIES: Left dominant configuration. Both origins are clearly patent. There is no dissection, occlusion or flow-limiting stenosis to the skull base (V1-V3 segments). CTA  HEAD FINDINGS POSTERIOR CIRCULATION: --Vertebral arteries: Normal V4 segments. --Inferior cerebellar arteries: Normal. --Basilar artery: Normal. --Superior cerebellar arteries: Normal. --Posterior cerebral arteries (PCA): Normal. ANTERIOR CIRCULATION: --Intracranial internal carotid arteries: Normal. --Anterior cerebral arteries (ACA): Normal. Both A1 segments are present. Patent anterior communicating artery (a-comm). --Middle cerebral arteries (MCA): Normal. VENOUS SINUSES: As permitted by contrast timing, patent. ANATOMIC VARIANTS: None Review of the MIP images confirms the above findings. IMPRESSION: 1. No emergent large vessel occlusion or high-grade stenosis of the intracranial arteries. 2. Mild bilateral carotid bifurcation atherosclerosis without hemodynamically significant stenosis by NASCET criteria. Aortic Atherosclerosis (ICD10-I70.0). Electronically Signed   By: Ulyses Jarred M.D.   On: 03/23/2021 19:38   CT ANGIO NECK CODE STROKE  Result Date: 03/23/2021 : CLINICAL DATA:  Expressive aphasia EXAM: CT ANGIOGRAPHY HEAD AND NECK TECHNIQUE: Multidetector CT imaging of the head and neck was performed using the standard protocol during bolus administration of intravenous contrast. Multiplanar CT image reconstructions and MIPs were obtained to evaluate the vascular anatomy. Carotid stenosis measurements (when applicable) are obtained utilizing NASCET criteria, using the distal internal carotid diameter as the denominator. CONTRAST:   98mL OMNIPAQUE IOHEXOL 350 MG/ML SOLN COMPARISON:   None. FINDINGS: CTA NECK FINDINGS SKELETON: There is no bony spinal canal stenosis. No lytic or blastic lesion. OTHER NECK: Normal pharynx, larynx and major salivary glands. No cervical lymphadenopathy. Unremarkable thyroid gland. UPPER CHEST: No pneumothorax or pleural effusion. No nodules or masses. AORTIC ARCH: There is calcific atherosclerosis of the aortic arch. There is no aneurysm, dissection or hemodynamically  significant stenosis of the visualized portion of the aorta. Conventional 3 vessel aortic branching pattern. The visualized proximal subclavian arteries are widely patent. RIGHT CAROTID SYSTEM: No dissection, occlusion or aneurysm. Mild atherosclerotic calcification at the carotid bifurcation without hemodynamically significant stenosis. LEFT CAROTID SYSTEM: No dissection, occlusion or aneurysm. Mild atherosclerotic calcification at the carotid bifurcation without hemodynamically significant stenosis. VERTEBRAL ARTERIES: Left dominant configuration. Both origins are clearly patent. There is no dissection, occlusion or flow-limiting stenosis to the skull base (V1-V3 segments). CTA HEAD FINDINGS POSTERIOR CIRCULATION: --Vertebral arteries: Normal V4 segments. --Inferior cerebellar arteries: Normal. --Basilar artery: Normal. --Superior cerebellar arteries: Normal. --Posterior cerebral arteries (PCA): Normal. ANTERIOR CIRCULATION: --Intracranial internal carotid arteries: Normal. --Anterior cerebral arteries (ACA): Normal. Both A1 segments are present. Patent anterior communicating artery (a-comm). --Middle cerebral arteries (MCA): Normal. VENOUS SINUSES: As permitted by contrast timing, patent. ANATOMIC VARIANTS: None Review of the MIP images confirms the above findings. IMPRESSION: 1. No emergent large vessel occlusion or high-grade stenosis of the intracranial arteries. 2. Mild bilateral carotid bifurcation atherosclerosis without hemodynamically significant stenosis by NASCET criteria. Aortic Atherosclerosis (ICD10-I70.0). Electronically Signed   By: Ulyses Jarred M.D.   On: 03/23/2021 19:45      PHYSICAL EXAM  Temp:  [97.6 F (36.4 C)-98.6 F (37 C)] 97.6 F (36.4 C) (06/21 1200) Pulse Rate:  [66-139] 75 (06/21 1300) Resp:  [11-26] 19 (06/21 1300) BP: (106-174)/(53-120) 162/79 (06/21 1300) SpO2:  [89 %-97 %] 96 % (06/21 1300) Weight:  [90 kg] 90 kg (06/21 0500) Bilateral wheezing General -obese  elderly Caucasian lady.  Not in distress.  Ophthalmologic - fundi not visualized due to noncooperation.  Cardiovascular - irregularly irregular heart rate and rhythm with afib on tele  Neuro - Alert. Sitting up in bed with her eyes open and is attentive to examiner. Global aphasia only able to say ok and yes to every question. Speaks occasional words and short sentences are difficult to understand.  Not follow simple commands except mouth open, but able to pantomime. Left gaze preference, barely cross midline, tracking on the left, inconsistently blinking to visual threat on the left but not blinking to the right. Right mild facial droop. Tongue protrusion not cooperative. RUE plegic. LUE antigravity. BLE withdraws to noxious with left more than right. UTA sensation and coordination given aphasia. Gait deferred due to right sided weakness.   ASSESSMENT/PLAN Danielle Payne is a 75 y.o. female with history of A. fib off AC, CHF, CAD, hypertension, hyperlipidemia, CAD status post CABG admitted for global aphasia.  tPA given.  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 fib not on AC Resultant global aphasia with right hemiparesis CT no acute abnormality CTA  head and neck no LVO 2D Echo EF 60 to 65% LDL 80.9 HgbA1c 8.8  HTS for cerebral edema, NA goal 150-155, now off due to serum Na 163. Free water 100 ml Q2 initiated 6/21 to help with hypernatremia  SCDs for VTE prophylaxis  No antithrombotic prior to admission, now on No antithrombotic within 24 hours of tPA due to hemorrhagic transformation Ongoing aggressive stroke risk factor management Therapy recommendations: Pending Disposition: Pending  Chronic A. fib with RVR Bradycardia to 10s Follows with Dr. Oval Linsey on 01/28/20 Home meds including Coreg and Cardizem on board Metoprolol IV as needed Will restart home meds once po access Cardiology consulted for assistance; continuing Coreg + Dilt for  rate control  Was on Coumadin before but taken off by patient self "concern that it was stiffening her arteries" "unwilling to take Eliquis or Xarelto because she does not know "what is in them."   taking a natural blood thinner sold over-the-counter upon the recommendation of her herbalist. Have discussed initiating a DOAC after 5 to 7 days for atrial fibrillation with daughter- she is in agreement   Hypertension Unstable, high On Cardene drip, hydralazine prn added. Cardiology following.  Resume Coreg and Cardizem once p.o. access Long term BP goal normotensive  DM2 with uncontrolled hyperglycemia On Insulin drip per CCM Appreciate CCM assistance A1C 8.8, uncontrolled with goal of less than 7.0 to control stroke risk factor        Asthma Exacerbation Required BiPAP overnight  Management per CCM, appreciated  Hyperlipidemia Home meds: None  LDL 80.9, goal < 70 Consider statin once p.o. access Continue statin at discharge  Dysphagia Did not pass swallow Pending MBS Speech following Cortrak for enteral nutrition         AKI CKD Monitor renal function  Renally does medications  Other Stroke Risk Factors Advanced age Obesity, Body mass index is 35.15 kg/m.  Coronary artery disease status post CABG CHF  Other Active Problems TG 1219, likely contamination from [place], NP  Stroke Service Nurse Practitioner  Patient seen and discussed with attending physician  Patient continues to show significant aphasia and hemiparesis and having mild respiratory difficulties.  Appreciate critical care team help.  Recommend start free water for significantly elevated sodium.  Discussed with patient's daughter over the phone and answered questions about her care.  Management of heart failure and aspiration pneumonia and respiratory status as per critical care team.   Discussed with Dr. Ernest Mallick critical care medicine continue mobilize out of bed and therapies.This  patient is critically ill and at significant risk of neurological worsening, death and care requires constant monitoring of vital signs, hemodynamics,respiratory and cardiac monitoring, extensive review of multiple databases, frequent neurological assessment, discussion with family, other specialists and medical decision making of high complexity.I have made any additions or clarifications directly to the above note.This critical care time does not reflect procedure time, or teaching time or supervisory time of PA/NP/Med Resident etc but could involve care discussion time.  I spent 30 minutes of neurocritical care time  in the care of  this patient.    Antony Contras, MD  To contact Stroke Continuity provider, please refer to [email or web address] After hours, contact General Neurology

## 2021-03-28 NOTE — Progress Notes (Signed)
NAME:  Danielle Payne, MRN:  [id number], DOB:  22-Aug-1946, LOS: 5 ADMISSION DATE:  03/23/2021, CONSULTATION DATE: 03/26/2019 REFERRING MD:  Dr. Leonie Man, CHIEF COMPLAINT:  Acute onset of aphasia and altered mental status   History of Present Illness:  75 year old female with history of chronic atrial fibrillation, chronic combined systolic and diastolic congestive heart failure, hypertension and coronary artery disease who initially presented with sudden onset of nausea aphasia and altered mental status.  She was diagnosed with acute left MCA stroke, received tPA and was admitted to neurosurgical ICU for close monitoring. Over next 24 hours patient started getting lethargic, she had hemorrhagic changes and a left ischemic stroke with hematoma. She is being started on 3 Saline for cerebral edema with midline shift and, PCCM was consulted for help with management  Pertinent  Medical History   Past Medical History:  Diagnosis Date   Allergy    Asthma    Atrial fibrillation ([place])    Chronic combined systolic and diastolic heart failure ([place]) 05/14/2018   Coronary artery disease    Diverticulitis    Diverticulitis    Hypertension    Pure hypercholesterolemia 05/14/2018   Stented coronary artery      Significant Hospital Events: Including procedures, antibiotic start and stop dates in addition to other pertinent events   6/16 admitted s/p tPA 6/18, CT head showed increasing cerebral edema with hemorrhagic conversion and midline shift, PCCM was consulted 6/19 more alert while on HTS 6/20 still diffusely wheezing> added BDs and steroids. Stopped hypertonic saline for Na+>160   Interim History / Subjective:  Overnight MRI aborted due to agitation. Now very sleepy this morning.  Objective   Blood pressure (!) 155/76, pulse 66, temperature 98.4 F (36.9 C), temperature source Axillary, resp. rate 13, height 5\' 3"  (1.6 m), weight 90 kg, SpO2 95 %.        Intake/Output Summary (Last 24  hours) at 03/28/2021 0805 Last data filed at 03/28/2021 0600 Gross per 24 hour  Intake 1681.75 ml  Output 400 ml  Net 1281.75 ml    Filed Weights   03/23/21 1800 03/25/21 0400 03/28/21 0500  Weight: 92.4 kg 90 kg 90 kg    Examination:   Physical exam: General: ill appearing woman lying in bed in NAD HEENT: [place]/AT, eyes anicteric. Cortrak in place. Neuro: Sleeping, difficult to arouse, but resisting physical exam. PERRL. Withdraws both LE with painful stimulation. Not responding to verbal stimulation but groans with sternal rub. Chest: no wheezing today, less accessory muscle use, exaggerated respiratory excursion  Heart: reg rate, irreg rhythm, no murmur Abdomen: soft, obese, NT.  Skin: warm, dry, no rashes. Extremities: arm edema, no significant LE edema   Labs/imaging that I havepersonally reviewed  (right click and "Reselect all SmartList Selections" daily)   Na+ 163 K+ 3.4 BG 365 BUN 56 Cr 1.22 WBC 20.3 No pending cultures CXR personally reviewed> no opacities, possibly interstitial edema vs low lung volumes  Resolved Hospital Problem list     Assessment & Plan:  Acute left MCA stroke s/p tPA, complicated with hemorrhagic transformation Cytotoxic cerebral edema with midline shift Acute encephalopathy due to acute stroke Dysphagia -will have to try MRI again when able to better control agitation -holding statin, aspirin -not sure that MBSS will be able to be performed as planned; appreciate ongoing management per SLP -con't cortrak for supplemental nutrition -con't holding DVT prophylaxis  Hypernatremia due to hyperosmolar therapy, also likely due to Unasyn use -con't monitoring Na+; have been avoiding  free water to let her naturally trickle back to normal -d/c unasyn  Chronic atrial fibrillation not on anticoagulation -con't coreg & diltiazem for rate control -tele monitoring  Hypertension -con't lisinopril> consider increasing to 40 mg to wean off  Nicardipine -con't coreg  -goal SBP <160; Cardene PRN  Leukocytosis- reactive from steroids most likely -monitor -cultures, UA, CXR  Hyperlipidemia -will start statin before discharge; delaying due to brain bleed  Acute respiratory failure with hypoxia due to acute asthma exacerbation -con't high dose steroids -con't Q4h breathing treatments -hopefully can deescalate steroids tomorrow if she continues to remain stable from pulmonary standpoint -checking BNP, although low suspicion for cardiogenic pulm edema.  Hypokalemia Poorly controlled GERD diabetes type 2 Acute kidney injury due to dehydration -monitor renal function -renally dose meds, avoid nephrotoxic meds -additional 40 mEq K+  Afib, rate controlled -appreciate cardiology's assistance -con't coreg and dilt -unfortunately no AC at this juncture for afib prophylaxis -monitor on tele  Asthma with acute exacerbation  -solumedrol 60mg  Q6h -duonebs Q4h -con't close monitoring; mental status not ideal for BiPAP  Uncontrolled hypergylcemia, likely due to steroids. Uncontrolled DM PTA- A1c 8.8 -con't insulin gtt -will need insulin decreased as steroids are decreased   AKI -monitor renal function closely -strict I/Os -renally dose meds, avoid nephrotoxic meds -checking BNP to guide volume management  Best Practice (right click and "Reselect all SmartList Selections" daily)   Diet/type: tubefeeds Pain/Anxiety/Delirium protocol Not indicated VAP protocol (if indicated): Not indicated DVT prophylaxis: SCD GI prophylaxis: PPI -- high dose steroids, high ICP Glucose control:  insulin gtt.  Central venous access:  N/A Arterial line:  N/A Foley:  N/A Mobility:  bed rest  PT consulted: Yes Code Status:  full code Last date of multidisciplinary goals of care discussion [Per primary team]. No Family at bedside today. Disposition: remains critically ill, will stay in intensive care    Labs   CBC: Recent Labs  Lab  03/23/21 1847 03/23/21 1858 03/25/21 0610 03/26/21 0300 03/26/21 1849 03/27/21 0424 03/28/21 0240  WBC 8.8  --  17.0* 13.1*  --  14.4* 20.3*  NEUTROABS 4.8  --   --   --   --   --   --   HGB 15.1*   < > 13.8 12.6 11.6* 12.2 12.1  HCT 46.0   < > 41.0 37.9 34.0* 38.0 37.2  MCV 92.0  --  88.0 90.7  --  93.1 93.0  PLT 233  --  238 181  --  218 245   < > = values in this interval not displayed.     Basic Metabolic Panel: Recent Labs  Lab 03/23/21 1847 03/23/21 1858 03/24/21 1650 03/25/21 0610 03/25/21 0921 03/25/21 1627 03/25/21 2022 03/26/21 0300 03/26/21 0907 03/26/21 1849 03/26/21 2041 03/27/21 0424 03/27/21 1238 03/27/21 1808 03/27/21 2031 03/28/21 0240  NA 135 137  --  135   < > 137   < > 148*  148*   < > 158*   < > 160* 163* 165* 165* 163*  K 3.7 3.8  --  3.4*  --   --   --  3.7  --  3.9  --  3.6  --   --   --  3.4*  CL 101 103  --  101  --   --   --  120*  --   --   --  129*  --   --   --  >130*  CO2 23  --   --  23  --   --   --  22  --   --   --  20*  --   --   --  21*  GLUCOSE 217* 214*  --  256*  --   --   --  204*  --   --   --  365*  --   --   --  188*  BUN 16 21  --  34*  --   --   --  38*  --   --   --  50*  --   --   --  56*  CREATININE 0.90 0.80  --  1.28*  --   --   --  1.02*  --   --   --  1.45*  --   --   --  1.22*  CALCIUM 9.2  --   --  9.1  --   --   --  8.4*  --   --   --  8.9  --   --   --  9.3  MG  --   --  1.8 2.1  --  2.1  --  2.2  --   --   --   --   --   --   --   --   PHOS  --   --  2.5 3.0  --  3.5  --  3.2  --   --   --   --   --   --   --   --    < > = values in this interval not displayed.    GFR: Estimated Creatinine Clearance: 41.7 mL/min (A) (by C-G formula based on SCr of 1.22 mg/dL (H)). Recent Labs  Lab 03/25/21 0610 03/26/21 0300 03/27/21 0424 03/28/21 0240  WBC 17.0* 13.1* 14.4* 20.3*     This patient is critically ill with multiple organ system failure which requires frequent high complexity decision making,  assessment, support, evaluation, and titration of therapies. This was completed through the application of advanced monitoring technologies and extensive interpretation of multiple databases. During this encounter critical care time was devoted to patient care services described in this note for 40 minutes.  Julian Hy, DO 03/28/21 8:51 AM [facility]

## 2021-03-28 NOTE — Progress Notes (Signed)
[facility] Progress Note Patient Name: Danielle Payne DOB: Jun 18, 1946 MRN: [id number]   Date of Service  03/28/2021  HPI/Events of Note  Request for pre med for MRI Patient seen restless and confused  eICU Interventions  Ativan 2 mg IV on call to MRI     Intervention Category Minor Interventions: Agitation / anxiety - evaluation and management  Judd Lien 03/28/2021, 3:04 AM

## 2021-03-29 ENCOUNTER — Inpatient Hospital Stay (HOSPITAL_COMMUNITY): Payer: Medicare Other

## 2021-03-29 DIAGNOSIS — I4821 Permanent atrial fibrillation: Secondary | ICD-10-CM | POA: Diagnosis not present

## 2021-03-29 DIAGNOSIS — R739 Hyperglycemia, unspecified: Secondary | ICD-10-CM | POA: Diagnosis not present

## 2021-03-29 DIAGNOSIS — I161 Hypertensive emergency: Secondary | ICD-10-CM | POA: Diagnosis not present

## 2021-03-29 DIAGNOSIS — R410 Disorientation, unspecified: Secondary | ICD-10-CM

## 2021-03-29 DIAGNOSIS — J4551 Severe persistent asthma with (acute) exacerbation: Secondary | ICD-10-CM | POA: Diagnosis not present

## 2021-03-29 DIAGNOSIS — J9601 Acute respiratory failure with hypoxia: Secondary | ICD-10-CM | POA: Diagnosis not present

## 2021-03-29 LAB — CBC
HCT: 39.1 % (ref 36.0–46.0)
Hemoglobin: 12.5 g/dL (ref 12.0–15.0)
MCH: 29.9 pg (ref 26.0–34.0)
MCHC: 32 g/dL (ref 30.0–36.0)
MCV: 93.5 fL (ref 80.0–100.0)
Platelets: 238 10*3/uL (ref 150–400)
RBC: 4.18 MIL/uL (ref 3.87–5.11)
RDW: 14.6 % (ref 11.5–15.5)
WBC: 19 10*3/uL — ABNORMAL HIGH (ref 4.0–10.5)
nRBC: 0.1 % (ref 0.0–0.2)

## 2021-03-29 LAB — BASIC METABOLIC PANEL
BUN: 54 mg/dL — ABNORMAL HIGH (ref 8–23)
CO2: 22 mmol/L (ref 22–32)
Calcium: 9.2 mg/dL (ref 8.9–10.3)
Chloride: >130 mmol/L (ref 98–111)
Creatinine, Ser: 0.98 mg/dL (ref 0.44–1.00)
GFR, Estimated: 60 mL/min — ABNORMAL LOW (ref >60)
Glucose, Bld: 156 mg/dL — ABNORMAL HIGH (ref 70–99)
Potassium: 3.5 mmol/L (ref 3.5–5.1)
Sodium: 164 mmol/L (ref 135–145)

## 2021-03-29 LAB — URINE CULTURE: Culture: NO GROWTH

## 2021-03-29 LAB — GLUCOSE, CAPILLARY
Glucose-Capillary: 141 mg/dL — ABNORMAL HIGH (ref 70–99)
Glucose-Capillary: 144 mg/dL — ABNORMAL HIGH (ref 70–99)
Glucose-Capillary: 150 mg/dL — ABNORMAL HIGH (ref 70–99)
Glucose-Capillary: 154 mg/dL — ABNORMAL HIGH (ref 70–99)
Glucose-Capillary: 156 mg/dL — ABNORMAL HIGH (ref 70–99)
Glucose-Capillary: 156 mg/dL — ABNORMAL HIGH (ref 70–99)
Glucose-Capillary: 164 mg/dL — ABNORMAL HIGH (ref 70–99)
Glucose-Capillary: 166 mg/dL — ABNORMAL HIGH (ref 70–99)
Glucose-Capillary: 167 mg/dL — ABNORMAL HIGH (ref 70–99)
Glucose-Capillary: 176 mg/dL — ABNORMAL HIGH (ref 70–99)
Glucose-Capillary: 183 mg/dL — ABNORMAL HIGH (ref 70–99)
Glucose-Capillary: 211 mg/dL — ABNORMAL HIGH (ref 70–99)
Glucose-Capillary: 215 mg/dL — ABNORMAL HIGH (ref 70–99)

## 2021-03-29 MED ORDER — QUETIAPINE FUMARATE 25 MG PO TABS
25.0000 mg | ORAL_TABLET | Freq: Every day | ORAL | Status: DC
  Administered 2021-03-29 – 2021-03-31 (×3): 25 mg via ORAL
  Filled 2021-03-29 (×3): qty 1

## 2021-03-29 MED ORDER — FREE WATER
200.0000 mL | Status: DC
  Administered 2021-03-29 – 2021-03-31 (×23): 200 mL

## 2021-03-29 MED ORDER — POTASSIUM CHLORIDE 20 MEQ PO PACK
40.0000 meq | PACK | Freq: Once | ORAL | Status: AC
  Administered 2021-03-29: 40 meq
  Filled 2021-03-29: qty 2

## 2021-03-29 MED ORDER — METHYLPREDNISOLONE SODIUM SUCC 125 MG IJ SOLR
60.0000 mg | Freq: Two times a day (BID) | INTRAMUSCULAR | Status: DC
  Administered 2021-03-29 – 2021-03-31 (×4): 60 mg via INTRAVENOUS
  Filled 2021-03-29 (×4): qty 2

## 2021-03-29 MED ORDER — LISINOPRIL 20 MG PO TABS
40.0000 mg | ORAL_TABLET | Freq: Every day | ORAL | Status: DC
  Administered 2021-03-29 – 2021-03-31 (×3): 40 mg
  Filled 2021-03-29 (×3): qty 2

## 2021-03-29 MED ORDER — DEXMEDETOMIDINE HCL IN NACL 400 MCG/100ML IV SOLN
0.2000 ug/kg/h | INTRAVENOUS | Status: DC
  Administered 2021-03-29: 0.2 ug/kg/h via INTRAVENOUS
  Filled 2021-03-29: qty 100

## 2021-03-29 MED ORDER — IPRATROPIUM-ALBUTEROL 0.5-2.5 (3) MG/3ML IN SOLN
3.0000 mL | Freq: Two times a day (BID) | RESPIRATORY_TRACT | Status: DC
  Administered 2021-03-29 – 2021-03-31 (×5): 3 mL via RESPIRATORY_TRACT
  Filled 2021-03-29 (×5): qty 3

## 2021-03-29 MED ORDER — INSULIN ASPART 100 UNIT/ML IJ SOLN
0.0000 [IU] | INTRAMUSCULAR | Status: DC
  Administered 2021-03-29 – 2021-03-30 (×2): 4 [IU] via SUBCUTANEOUS
  Administered 2021-03-30: 15 [IU] via SUBCUTANEOUS
  Administered 2021-03-30: 20 [IU] via SUBCUTANEOUS
  Administered 2021-03-30: 11 [IU] via SUBCUTANEOUS
  Administered 2021-03-30 – 2021-03-31 (×3): 4 [IU] via SUBCUTANEOUS
  Administered 2021-03-31: 3 [IU] via SUBCUTANEOUS
  Administered 2021-03-31: 15 [IU] via SUBCUTANEOUS
  Administered 2021-03-31: 4 [IU] via SUBCUTANEOUS
  Administered 2021-03-31: 20 [IU] via SUBCUTANEOUS
  Administered 2021-03-31: 4 [IU] via SUBCUTANEOUS
  Administered 2021-04-01: 3 [IU] via SUBCUTANEOUS
  Administered 2021-04-01: 7 [IU] via SUBCUTANEOUS
  Administered 2021-04-01: 4 [IU] via SUBCUTANEOUS

## 2021-03-29 MED ORDER — PANTOPRAZOLE SODIUM 40 MG PO PACK
40.0000 mg | PACK | Freq: Two times a day (BID) | ORAL | Status: DC
  Administered 2021-03-29 – 2021-03-31 (×5): 40 mg
  Filled 2021-03-29 (×5): qty 20

## 2021-03-29 MED ORDER — INSULIN DETEMIR 100 UNIT/ML ~~LOC~~ SOLN
15.0000 [IU] | SUBCUTANEOUS | Status: DC
  Administered 2021-03-29 – 2021-03-31 (×3): 15 [IU] via SUBCUTANEOUS
  Filled 2021-03-29 (×4): qty 0.15

## 2021-03-29 NOTE — Progress Notes (Addendum)
STROKE TEAM PROGRESS NOTE   SUBJECTIVE (INTERVAL HISTORY) No acute events overnight, she continues to have global aphasia, mild right facial droop and right arm weakness with improving alertness.  She is more alert and interactive today and is able to speak short sentences in a few words but still not following commands consistently.  Vital signs are stable.  HTS gtt discontinued on 6/20 due to Na elevation. Na 164 today, free water was increased from 100 Q2 to 200 Q2 today to treat hypernatremia.   She remains in mild respiratory distress with bilateral wheezing and is on oxygen, steroids and bronchodilators.  She is on insulin drip for elevated sugars  OBJECTIVE Temp:  [97.5 F (36.4 C)-97.6 F (36.4 C)] 97.6 F (36.4 C) (06/22 1200) Pulse Rate:  [57-102] 60 (06/22 0815) Cardiac Rhythm: Atrial fibrillation (06/21 2000) Resp:  [12-23] 19 (06/22 0815) BP: (111-181)/(56-118) 119/74 (06/22 0800) SpO2:  [87 %-97 %] 95 % (06/22 0832) Weight:  [91 kg] 91 kg (06/22 0405)  Recent Labs  Lab 03/29/21 0513 03/29/21 0702 03/29/21 0930 03/29/21 1023 03/29/21 1142  GLUCAP 141* 156* 183* 215* 211*   Recent Labs  Lab 03/24/21 1650 03/25/21 0610 03/25/21 0921 03/25/21 1627 03/25/21 2022 03/26/21 0300 03/26/21 0907 03/26/21 1849 03/26/21 2041 03/27/21 0424 03/27/21 1238 03/27/21 1808 03/27/21 2031 03/28/21 0240 03/28/21 0921 03/29/21 0410  NA  --  135   < > 137   < > 148*  148*   < > 158*   < > 160*   < > 165* 165* 163* 165* 164*  K  --  3.4*  --   --   --  3.7  --  3.9  --  3.6  --   --   --  3.4*  --  3.5  CL  --  101  --   --   --  120*  --   --   --  129*  --   --   --  >130*  --  >130*  CO2  --  23  --   --   --  22  --   --   --  20*  --   --   --  21*  --  22  GLUCOSE  --  256*  --   --   --  204*  --   --   --  365*  --   --   --  188*  --  156*  BUN  --  34*  --   --   --  38*  --   --   --  50*  --   --   --  56*  --  54*  CREATININE  --  1.28*  --   --   --  1.02*   --   --   --  1.45*  --   --   --  1.22*  --  0.98  CALCIUM  --  9.1  --   --   --  8.4*  --   --   --  8.9  --   --   --  9.3  --  9.2  MG 1.8 2.1  --  2.1  --  2.2  --   --   --   --   --   --   --   --   --   --   PHOS 2.5 3.0  --  3.5  --  3.2  --   --   --   --   --   --   --   --   --   --    < > = values in this interval not displayed.   Recent Labs  Lab 03/23/21 1847  AST 24  ALT 23  ALKPHOS 88  BILITOT 0.6  PROT 7.4  ALBUMIN 3.8   Recent Labs  Lab 03/23/21 1847 03/23/21 1858 03/25/21 0610 03/26/21 0300 03/26/21 1849 03/27/21 0424 03/28/21 0240 03/29/21 0410  WBC 8.8  --  17.0* 13.1*  --  14.4* 20.3* 19.0*  NEUTROABS 4.8  --   --   --   --   --   --   --   HGB 15.1*   < > 13.8 12.6 11.6* 12.2 12.1 12.5  HCT 46.0   < > 41.0 37.9 34.0* 38.0 37.2 39.1  MCV 92.0  --  88.0 90.7  --  93.1 93.0 93.5  PLT 233  --  238 181  --  218 245 238   < > = values in this interval not displayed.   No results for input(s): CKTOTAL, CKMB, CKMBINDEX, TROPONINI in the last 168 hours. No results for input(s): LABPROT, INR in the last 72 hours.  Recent Labs    03/28/21 0837  COLORURINE YELLOW  LABSPEC 1.024  PHURINE 5.0  GLUCOSEU 50*  HGBUR NEGATIVE  BILIRUBINUR NEGATIVE  KETONESUR 5*  PROTEINUR NEGATIVE  NITRITE NEGATIVE  LEUKOCYTESUR NEGATIVE        Component Value Date/Time   CHOL 126 03/24/2021 0510   CHOL 191 11/05/2019 0902   TRIG 1,219 (H) 03/24/2021 0510   HDL 27 (L) 03/24/2021 0510   HDL 39 (L) 11/05/2019 0902   CHOLHDL NOT REPORTED DUE TO HIGH TRIGLYCERIDES 03/24/2021 0510   VLDL UNABLE TO CALCULATE IF TRIGLYCERIDE OVER 400 mg/dL 03/24/2021 0510   LDLCALC UNABLE TO CALCULATE IF TRIGLYCERIDE OVER 400 mg/dL 03/24/2021 0510   LDLCALC 119 (H) 11/05/2019 0902   Lab Results  Component Value Date   HGBA1C 8.8 (H) 03/24/2021      Component Value Date/Time   LABOPIA NONE DETECTED 03/24/2021 0126   COCAINSCRNUR NONE DETECTED 03/24/2021 0126   LABBENZ NONE  DETECTED 03/24/2021 0126   AMPHETMU NONE DETECTED 03/24/2021 0126   THCU NONE DETECTED 03/24/2021 0126   LABBARB NONE DETECTED 03/24/2021 0126    Recent Labs  Lab 03/23/21 1847  ETH <10    I have personally reviewed the radiological images below and agree with the radiology interpretations.  CT Head Wo Contrast  Result Date: 03/28/2021 CLINICAL DATA:  Stroke, follow-up EXAM: CT HEAD WITHOUT CONTRAST TECHNIQUE: Contiguous axial images were obtained from the base of the skull through the vertex without intravenous contrast. COMPARISON:  03/26/2021 FINDINGS: Brain: Areas of evolving recent infarction with hemorrhagic conversion again identified in the left frontal, parietal, and lateral occipital lobes. Similar associated mass effect remains mild. Unchanged small left temporal, likely subarachnoid hemorrhage. No new hemorrhage or loss of gray-white differentiation. Ventricles are stable in size. Vascular: No new findings. Skull: Calvarium is unremarkable. Sinuses/Orbits: No acute finding. Other: None. 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 Unchanged small left temporal, likely subarachnoid hemorrhage. No new hemorrhage or worsening mass effect. Electronically Signed   By: Macy Mis M.D.   On: 03/28/2021 10:43   CT HEAD WO CONTRAST  Result Date: 03/26/2021 CLINICAL DATA:  Stroke follow-up EXAM: CT HEAD WITHOUT CONTRAST TECHNIQUE: Contiguous axial images were  obtained from the base of the skull through the vertex without intravenous contrast. COMPARISON:  CT head 03/25/2021 FINDINGS: Brain: Acute infarct in the left middle frontal lobe unchanged with minimal hemorrhage, unchanged. Acute infarct left parietal lobe with hemorrhagic transformation unchanged from yesterday. Infarct also extends into the left occipital lobe hemorrhagic transformation unchanged. Small amount of hemorrhage in the left temporal lobe likely subarachnoid hemorrhage is unchanged. Slight  midline shift to the right unchanged. Negative for hydrocephalus. No new area of infarct. Vascular: Negative for hyperdense vessel Skull: Negative Sinuses/Orbits: Mucosal edema paranasal sinuses.  Negative orbit Other: None IMPRESSION: No change from yesterday. Acute infarct in the left MCA and left PCA territory with hemorrhagic transformation in the left occipital and parietal lobes. Acute infarct left middle frontal gyrus with minimal hemorrhage, unchanged. No new area of hemorrhage or infarct. Electronically Signed   By: Franchot Gallo M.D.   On: 03/26/2021 18:47   CT HEAD WO CONTRAST  Result Date: 03/25/2021 CLINICAL DATA:  75 year old female code stroke presentation status post IV tPA. Left MCA territory infarct with mild hemorrhagic changes. EXAM: CT HEAD WITHOUT CONTRAST TECHNIQUE: Contiguous axial images were obtained from the base of the skull through the vertex without intravenous contrast. COMPARISON:  Head CT 03/24/2021 and earlier. FINDINGS: Brain: Mixed cytotoxic edema and rounded, nodular areas of hemorrhage redemonstrated in the left MCA territory. The several dominant foci of blood in the posterior left frontal and left parietal lobes continuing toward the left occipital pole are stable and size and configuration since yesterday (e.g. Series 4, image 22). Partial effacement of the left lateral ventricle and 2-3 mm of rightward midline shift are stable. No intraventricular hemorrhage. Probable gyral petechial hemorrhage at the left temporal operculum is stable on series 4, image 18. No definite extra-axial hemorrhage. Right hemisphere and posterior fossa gray-white matter differentiation is stable and within normal limits. No ventriculomegaly. Basilar cisterns remain patent. Vascular: Calcified atherosclerosis at the skull base. Skull: No acute osseous abnormality identified. Sinuses/Orbits: Visualized paranasal sinuses and mastoids are stable and well aerated. Other: Stable right nasoenteric  tube. Mild leftward gaze is stable. Visualized scalp soft tissues are within normal limits. IMPRESSION: 1. Stable Left MCA infarct with hemorrhagic transformation since yesterday. Extension to the Left MCA/PCA watershed territory as before. Partially effaced left lateral ventricle and trace rightward midline shift are stable. No extra-axial extension of blood. 2. No new areas of involvement or new intracranial abnormality. Electronically Signed   By: Genevie Ann M.D.   On: 03/25/2021 05:19   CT HEAD WO CONTRAST  Result Date: 03/24/2021 CLINICAL DATA:  Stroke.  24 hours post tPA EXAM: CT HEAD WITHOUT CONTRAST TECHNIQUE: Contiguous axial images were obtained from the base of the skull through the vertex without intravenous contrast. COMPARISON:  CT head 03/23/2021 FINDINGS: Brain: Interval development of left MCA infarct with hypodensity in the left frontal and parietal lobe. There is also involvement of the left occipital lobe. There is hemorrhagic transformation of the infarct involving the left temporal and parietal lobe with a moderate amount of blood present. Ventricle size normal.  No midline shift. Vascular: Negative for hyperdense vessel Skull: Negative Sinuses/Orbits: Mucosal edema paranasal sinuses. Feeding tube in place. Air-fluid level sphenoid sinus. Negative orbit. Other: None IMPRESSION: Interval development of acute infarct in the left MCA territory with hypodensity in the left temporal and parietal lobe. This also extends into the left occipital lobe. There has been interval hemorrhagic transformation of the infarct with moderate amount of blood present  within the infarction. Dr. Cheral Marker  has been paged to discuss the results. Electronically Signed   By: Franchot Gallo M.D.   On: 03/24/2021 18:30   DG CHEST PORT 1 VIEW  Result Date: 03/28/2021 CLINICAL DATA:  Hypoxia.  Stroke. EXAM: PORTABLE CHEST 1 VIEW COMPARISON:  03/26/2021. 11/20/2018. FINDINGS: Feeding tube noted with tip below left  hemidiaphragm. Cardiomegaly again noted. Left atrial appendage clip in stable position. Pulmonary venous congestion. Bilateral interstitial prominence again noted. Findings suggest CHF. Low lung volumes. Small left pleural effusion may be present. No pneumothorax. Surgical clips left upper quadrant. IMPRESSION: 1.  Feeding tube noted with tip below left hemidiaphragm. 2. Cardiomegaly. Left atrial appendage clip in stable position. Bilateral interstitial prominence. Findings suggest congestive heart failure with interstitial edema. Small left pleural effusion may be present. Electronically Signed   By: Marcello Moores  Register   On: 03/28/2021 11:13   DG CHEST PORT 1 VIEW  Result Date: 03/26/2021 CLINICAL DATA:  Respiratory distress. EXAM: PORTABLE CHEST 1 VIEW COMPARISON:  Chest radiograph 03/24/2021. FINDINGS: Enteric tube courses inferior to the diaphragm. Stable cardiomegaly status post median sternotomy. Bilateral interstitial pulmonary opacities. No pleural effusion or pneumothorax. Thoracic spine degenerative changes. IMPRESSION: Cardiomegaly with mild interstitial opacities suggestive of edema. Electronically Signed   By: Lovey Newcomer M.D.   On: 03/26/2021 16:56   DG CHEST PORT 1 VIEW  Result Date: 03/24/2021 CLINICAL DATA:  Fevers EXAM: PORTABLE CHEST 1 VIEW COMPARISON:  11/20/2018 FINDINGS: Cardiac shadow is enlarged. Postsurgical changes are again seen. Feeding catheter is noted extending into the stomach. Lungs are hypoinflated. Mild vascular congestion is noted without parenchymal edema. Left basilar opacity is noted which may represent early infiltrate. IMPRESSION: Mild CHF without pulmonary edema. Left basilar opacity which may represent an early infiltrate. Electronically Signed   By: Inez Catalina M.D.   On: 03/24/2021 17:54   DG Abd Portable 1V  Result Date: 03/24/2021 CLINICAL DATA:  Feeding tube placement EXAM: PORTABLE ABDOMEN - 1 VIEW COMPARISON:  Portable exam 1314 hours without priors for  comparison FINDINGS: Tip of feeding tube projects over distal gastric antrum. Visualized bowel gas pattern normal. Atherosclerotic calcifications aorta and splenic artery. Prior median sternotomy and LEFT atrial appendage clipping. Subsegmental atelectasis at LEFT lung base. IMPRESSION: Tip of feeding tube projects over distal gastric antrum. Electronically Signed   By: Lavonia Dana M.D.   On: 03/24/2021 13:40   EEG adult  Result Date: 03/26/2021 Lora Havens, MD     03/26/2021  9:04 PM Patient Name: Danielle Payne MRN: [id number] Epilepsy Attending: Lora Havens Referring Physician/Provider: Dr Amie [place] Date: 03/26/2021 Duration: 24.33 mins Patient history: 75yo F with L MCA stroke, continues to be altered. EEG to evaluate for status epilepticus. Level of alertness: lethargic AEDs during EEG study: None Technical aspects: This EEG was obtained using a 10 lead EEG system positioned circumferentially without any parasagittal coverage (rapid EEG). Computer selected EEG is reviewed as well as background features and all clinically significant events.  Description: EEG showed continuous generalized and lateralized left hemisphere polymorphic 3 to 6 Hz theta-delta slowing.  Hyperventilation and photic stimulation were not performed.   ABNORMALITY - Continuous slow, generalized and lateralized left hemisphere IMPRESSION: This study is  suggestive of cortical dysfunction arising from left hemisphere, likely secondary to underlying stroke as well as severe diffuse encephalopathy, nonspecific etiology. No seizures or epileptiform discharges were seen throughout the recording. Dr Rory Percy was notified. Priyanka Barbra Sarks   ECHOCARDIOGRAM COMPLETE  Result Date:  03/24/2021    ECHOCARDIOGRAM REPORT   Patient Name:   NAHLIA HELLMANN Date of Exam: 03/24/2021 Medical Rec #:  [id number]       Height:       63.0 in Accession #:    [id number]      Weight:       203.7 lb Date of Birth:  1946-08-27      BSA:          1.949 m  Patient Age:    22 years        BP:           169/83 mmHg Patient Gender: F               HR:           109 bpm. Exam Location:  Inpatient Procedure: 2D Echo Indications:    stroke  History:        Patient has prior history of Echocardiogram examinations, most                 recent 05/26/2018. CAD, Arrythmias:Atrial Fibrillation; Risk                 Factors:Diabetes and Hypertension.  Sonographer:    Johny Chess Referring Phys: [id number] HUNTER J COLLINS  Sonographer Comments: Suboptimal apical window. Image acquisition challenging due to uncooperative patient, Image acquisition challenging due to patient body habitus and restraints. kept moving. IMPRESSIONS  1. Left ventricular ejection fraction, by estimation, is 60 to 65%. The left ventricle has normal function. The left ventricle has no regional wall motion abnormalities. Left ventricular diastolic function could not be evaluated.  2. Right ventricular systolic function is normal. The right ventricular size is normal.  3. The mitral valve is grossly normal. No evidence of mitral valve regurgitation. No evidence of mitral stenosis.  4. The aortic valve is grossly normal. Aortic valve regurgitation is mild. No aortic stenosis is present. FINDINGS  Left Ventricle: Left ventricular ejection fraction, by estimation, is 60 to 65%. The left ventricle has normal function. The left ventricle has no regional wall motion abnormalities. The left ventricular internal cavity size was normal in size. There is  borderline left ventricular hypertrophy. Left ventricular diastolic function could not be evaluated due to atrial fibrillation. Left ventricular diastolic function could not be evaluated. Right Ventricle: The right ventricular size is normal. Right vetricular wall thickness was not well visualized. Right ventricular systolic function is normal. Left Atrium: Left atrial size was normal in size. Right Atrium: Right atrial size was not well visualized. Pericardium:  There is no evidence of pericardial effusion. Mitral Valve: The mitral valve is grossly normal. No evidence of mitral valve regurgitation. No evidence of mitral valve stenosis. Tricuspid Valve: The tricuspid valve is grossly normal. Tricuspid valve regurgitation is not demonstrated. Aortic Valve: The aortic valve is grossly normal. Aortic valve regurgitation is mild. No aortic stenosis is present. Pulmonic Valve: The pulmonic valve was grossly normal. Pulmonic valve regurgitation is not visualized. Aorta: The aortic root and ascending aorta are structurally normal, with no evidence of dilitation. IAS/Shunts: The atrial septum is grossly normal.  LEFT VENTRICLE PLAX 2D LVIDd:         4.60 cm LVIDs:         3.40 cm LV PW:         1.10 cm LV IVS:        1.30 cm LVOT diam:     1.90 cm LVOT Area:  2.84 cm  IVC IVC diam: 2.20 cm LEFT ATRIUM           Index LA diam:      4.40 cm 2.26 cm/m LA Vol (A4C): 60.2 ml 30.89 ml/m   AORTA Ao Root diam: 2.80 cm Ao Asc diam:  3.20 cm  SHUNTS Systemic Diam: 1.90 cm Mertie Moores MD Electronically signed by Mertie Moores MD Signature Date/Time: 03/24/2021/5:19:01 PM    Final    CT HEAD CODE STROKE WO CONTRAST  Result Date: 03/23/2021 CLINICAL DATA:  Code stroke.  Acute neuro deficit.  Aphasia EXAM: CT HEAD WITHOUT CONTRAST TECHNIQUE: Contiguous axial images were obtained from the base of the skull through the vertex without intravenous contrast. COMPARISON:  None. FINDINGS: Brain: Mild atrophy and mild white matter hypodensity bilaterally. Negative for acute infarct, hemorrhage, mass. Vascular: Negative for hyperdense vessel Skull: Negative Sinuses/Orbits: Mild mucosal edema paranasal sinuses. Negative orbit Other: None ASPECTS ([place] Stroke Program Early CT Score) - Ganglionic level infarction (caudate, lentiform nuclei, internal capsule, insula, M1-M3 cortex): 7 - Supraganglionic infarction (M4-M6 cortex): 3 Total score (0-10 with 10 being normal): 10 IMPRESSION: 1. No  acute abnormality. 2. ASPECTS is 10 3. Code stroke imaging results were communicated on 03/23/2021 at 6:59 pm to provider [place] via text page Electronically Signed   By: Franchot Gallo M.D.   On: 03/23/2021 19:00   CT ANGIO HEAD CODE STROKE  Result Date: 03/23/2021 CLINICAL DATA:  Expressive aphasia EXAM: CT ANGIOGRAPHY HEAD AND NECK TECHNIQUE: Multidetector CT imaging of the head and neck was performed using the standard protocol during bolus administration of intravenous contrast. Multiplanar CT image reconstructions and MIPs were obtained to evaluate the vascular anatomy. Carotid stenosis measurements (when applicable) are obtained utilizing NASCET criteria, using the distal internal carotid diameter as the denominator. CONTRAST:  68mL OMNIPAQUE IOHEXOL 350 MG/ML SOLN COMPARISON:  None. FINDINGS: CTA NECK FINDINGS SKELETON: There is no bony spinal canal stenosis. No lytic or blastic lesion. OTHER NECK: Normal pharynx, larynx and major salivary glands. No cervical lymphadenopathy. Unremarkable thyroid gland. UPPER CHEST: No pneumothorax or pleural effusion. No nodules or masses. AORTIC ARCH: There is calcific atherosclerosis of the aortic arch. There is no aneurysm, dissection or hemodynamically significant stenosis of the visualized portion of the aorta. Conventional 3 vessel aortic branching pattern. The visualized proximal subclavian arteries are widely patent. RIGHT CAROTID SYSTEM: No dissection, occlusion or aneurysm. Mild atherosclerotic calcification at the carotid bifurcation without hemodynamically significant stenosis. LEFT CAROTID SYSTEM: No dissection, occlusion or aneurysm. Mild atherosclerotic calcification at the carotid bifurcation without hemodynamically significant stenosis. VERTEBRAL ARTERIES: Left dominant configuration. Both origins are clearly patent. There is no dissection, occlusion or flow-limiting stenosis to the skull base (V1-V3 segments). CTA HEAD FINDINGS POSTERIOR CIRCULATION:  --Vertebral arteries: Normal V4 segments. --Inferior cerebellar arteries: Normal. --Basilar artery: Normal. --Superior cerebellar arteries: Normal. --Posterior cerebral arteries (PCA): Normal. ANTERIOR CIRCULATION: --Intracranial internal carotid arteries: Normal. --Anterior cerebral arteries (ACA): Normal. Both A1 segments are present. Patent anterior communicating artery (a-comm). --Middle cerebral arteries (MCA): Normal. VENOUS SINUSES: As permitted by contrast timing, patent. ANATOMIC VARIANTS: None Review of the MIP images confirms the above findings. IMPRESSION: 1. No emergent large vessel occlusion or high-grade stenosis of the intracranial arteries. 2. Mild bilateral carotid bifurcation atherosclerosis without hemodynamically significant stenosis by NASCET criteria. Aortic Atherosclerosis (ICD10-I70.0). Electronically Signed   By: Ulyses Jarred M.D.   On: 03/23/2021 19:38   CT ANGIO NECK CODE STROKE  Result Date: 03/23/2021 : CLINICAL DATA:  Expressive aphasia EXAM: CT ANGIOGRAPHY HEAD AND NECK TECHNIQUE: Multidetector CT imaging of the head and neck was performed using the standard protocol during bolus administration of intravenous contrast. Multiplanar CT image reconstructions and MIPs were obtained to evaluate the vascular anatomy. Carotid stenosis measurements (when applicable) are obtained utilizing NASCET criteria, using the distal internal carotid diameter as the denominator. CONTRAST:   32mL OMNIPAQUE IOHEXOL 350 MG/ML SOLN COMPARISON:   None. FINDINGS: CTA NECK FINDINGS SKELETON: There is no bony spinal canal stenosis. No lytic or blastic lesion. OTHER NECK: Normal pharynx, larynx and major salivary glands. No cervical lymphadenopathy. Unremarkable thyroid gland. UPPER CHEST: No pneumothorax or pleural effusion. No nodules or masses. AORTIC ARCH: There is calcific atherosclerosis of the aortic arch. There is no aneurysm, dissection or hemodynamically significant stenosis of the visualized  portion of the aorta. Conventional 3 vessel aortic branching pattern. The visualized proximal subclavian arteries are widely patent. RIGHT CAROTID SYSTEM: No dissection, occlusion or aneurysm. Mild atherosclerotic calcification at the carotid bifurcation without hemodynamically significant stenosis. LEFT CAROTID SYSTEM: No dissection, occlusion or aneurysm. Mild atherosclerotic calcification at the carotid bifurcation without hemodynamically significant stenosis. VERTEBRAL ARTERIES: Left dominant configuration. Both origins are clearly patent. There is no dissection, occlusion or flow-limiting stenosis to the skull base (V1-V3 segments). CTA HEAD FINDINGS POSTERIOR CIRCULATION: --Vertebral arteries: Normal V4 segments. --Inferior cerebellar arteries: Normal. --Basilar artery: Normal. --Superior cerebellar arteries: Normal. --Posterior cerebral arteries (PCA): Normal. ANTERIOR CIRCULATION: --Intracranial internal carotid arteries: Normal. --Anterior cerebral arteries (ACA): Normal. Both A1 segments are present. Patent anterior communicating artery (a-comm). --Middle cerebral arteries (MCA): Normal. VENOUS SINUSES: As permitted by contrast timing, patent. ANATOMIC VARIANTS: None Review of the MIP images confirms the above findings. IMPRESSION: 1. No emergent large vessel occlusion or high-grade stenosis of the intracranial arteries. 2. Mild bilateral carotid bifurcation atherosclerosis without hemodynamically significant stenosis by NASCET criteria. Aortic Atherosclerosis (ICD10-I70.0). Electronically Signed   By: Ulyses Jarred M.D.   On: 03/23/2021 19:45      PHYSICAL EXAM  Temp:  [97.5 F (36.4 C)-97.6 F (36.4 C)] 97.6 F (36.4 C) (06/22 1200) Pulse Rate:  [57-102] 60 (06/22 0815) Resp:  [12-23] 19 (06/22 0815) BP: (111-181)/(56-118) 119/74 (06/22 0800) SpO2:  [87 %-97 %] 95 % (06/22 0832) Weight:  [91 kg] 91 kg (06/22 0405) Bilateral wheezing General -obese elderly Caucasian lady.  Not in  distress.  Ophthalmologic - fundi not visualized due to noncooperation.  Cardiovascular - irregularly irregular heart rate and rhythm with afib on tele  Neuro - Alert. Sitting up in bed with her eyes open and is attentive to examiner. Global aphasia but speaks occasional words and short sentences and difficult to understand.  Not follow simple commands except mouth open, but able to pantomime. Left gaze preference, barely cross midline, tracking on the left, inconsistently blinking to visual threat on the left but not blinking to the right. Right mild facial droop. Tongue protrusion not cooperative.  Able to move right upper extremity against gravity but mild weakness of grip and intrinsic hand muscles. LUE antigravity. BLE withdraws to noxious with left more than right. UTA sensation and coordination given aphasia. Gait deferred due to right sided weakness.   ASSESSMENT/PLAN Ms. Mikaylah Libbey is a 75 y.o. female with history of A. fib off AC, CHF, CAD, hypertension, hyperlipidemia, CAD status post CABG admitted for global aphasia.  tPA given.  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 fib not on AC Resultant global aphasia with  right hemiparesis CT no acute abnormality CTA head and neck no LVO 2D Echo EF 60 to 65% LDL 80.9 HgbA1c 8.8  HTS for cerebral edema, NA goal 150-155, now off due to serum Na 163. Free water 100 ml Q2 initiated 6/21 to help with hypernatremia. Increased from 100 Q2 to 200 Q2 on 6/22. SCDs for VTE prophylaxis  No antithrombotic prior to admission, now on No antithrombotic within 24 hours of tPA due to hemorrhagic transformation Ongoing aggressive stroke risk factor management Therapy recommendations: Pending Disposition: Pending  Chronic A. fib with RVR Bradycardia to 11s Follows with Dr. Oval Linsey on 01/28/20 Home meds including Coreg and Cardizem on board Metoprolol IV as needed Will restart home meds once po  access Cardiology consulted for assistance; continuing Coreg 25 BID for rate control per their recommendations  Was on Coumadin before but taken off by patient self "concern that it was stiffening her arteries" "unwilling to take Eliquis or Xarelto because she does not know "what is in them."   taking a natural blood thinner sold over-the-counter upon the recommendation of her herbalist. Have discussed initiating a DOAC after 5 to 7 days for atrial fibrillation with daughter- she is in agreement   Hypertension Unstable, high On Cardene drip, Coreg, Lisinopril. Trying to wean off Cardene at this time.  Lisinopril increased from 20 to 40 on 6/22 for better SBP control  SBP goal is normotensive at this time   DM2 with uncontrolled hyperglycemia On Insulin drip per CCM Appreciate CCM assistance A1C 8.8, uncontrolled with goal of less than 7.0 to control stroke risk factor        Asthma Exacerbation Required BiPAP overnight  Management per CCM, appreciated  Hyperlipidemia Home meds: None  LDL 80.9, goal < 70 Consider statin once p.o. access Continue statin at discharge  Dysphagia Did not pass swallow Pending MBS by ST  Speech following Cortrak for enteral nutrition         AKI CKD Monitor renal function  Renally does medications  Other Stroke Risk Factors Advanced age Obesity, Body mass index is 35.54 kg/m.  Coronary artery disease status post CABG CHF  Other Active Problems TG 1219, likely contamination from [place], NP  Stroke Service Nurse Practitioner  Patient seen and discussed with attending physician Dr. Leonie Man   Patient continues to show significant aphasia but hemiparesis and respiratory difficulties.  Appear to be improving.  Appreciate critical care team help.  Recommend start free water for significantly elevated sodium.  Discussed with patient's daughter at the bedside and answered questions about her care.  Management of heart failure and  aspiration pneumonia and respiratory status as per critical care team.   Discussed with Dr. Ernest Mallick critical care medicine Continue mobilize out of bed and therapies.wean off insulin drip and nicardipine drips as tolerated this patient is critically ill and at significant risk of neurological worsening, death and care requires constant monitoring of vital signs, hemodynamics,respiratory and cardiac monitoring, extensive review of multiple databases, frequent neurological assessment, discussion with family, other specialists and medical decision making of high complexity.I have made any additions or clarifications directly to the above note.This critical care time does not reflect procedure time, or teaching time or supervisory time of PA/NP/Med Resident etc but could involve care discussion time.  I spent 30 minutes of neurocritical care time  in the care of  this patient.     Antony Contras, MD  To contact Stroke Continuity provider, please refer to [email or web address] After  hours, contact General Neurology

## 2021-03-29 NOTE — Progress Notes (Signed)
  Speech Language Pathology Treatment:    Patient Details Name: Danielle Payne MRN: [id number] DOB: Jan 30, 1946 Today's Date: 03/29/2021 Time:  -     MBS scheduled this afternoon at 1:00               [name] 03/29/2021, 10:29 AM  [name] Caroli.Ed Risk analyst [phone] Office [phone]

## 2021-03-29 NOTE — Progress Notes (Signed)
Inpatient Rehab Admissions Coordinator Note:   Per updated PT recommendations, pt was screened for CIR candidacy by Shann Medal, PT, DPT.  At this time we are recommending a CIR consult and I will place an order per our protocol.  Please contact me with questions.   Shann Medal, PT, DPT [phone] 03/29/21 3:37 PM

## 2021-03-29 NOTE — Progress Notes (Signed)
This RN notified [facility] physician of patient's extreme agitated state. Notified physician that haldol was given two night's ago by this RN and it seemed to have a reverse effect on the patient and made things worse. A QT interval was drawn on the patient and it was 0.38. Low dose precedex infusion was ordered by [facility] physician. Will start patient on precedex and continue to monitor.   Wyn Quaker, RN

## 2021-03-29 NOTE — Progress Notes (Signed)
CCM Afternoon Progress Note   75 yo F acute L CVA s/p tpa with hemorrhagic transformation. Patient received 3% saline, cardene gtt during her ICU course, and more recently had been improving from an asthma exacerbation for which she was on high dose steroids-- resulting in hyperglycemia requiring insulin gtt.  Today, orders placed for transfer to [facility] given her stabilized clinical status   This afternoon --  Pt up to bedside commode and had BM with small volume of blood. In setting of high dose steroids, wonder if possible gastritis? Does also have hx of diverticulitis Already on qD PPI (hx GERD, steroids) P -Increase PPI to BID -defer CBC recheck at this time as volume was quite small, no change in hemodynamic stability or abdominal exam  -monitor subsequent Bms for quantity/quality of blood present  Pt steroids decreased to q12 hrs this morning. Over course of day, has been on insulin gtt per endotool and is ready for transition off of insulin infusion at approx 15:30. Endotool Rec 30units levimir -- I worry this may be higher than needed for this pt as I anticipate her glycemic correction needs will continue to decr given steroid reduction P -Levemir 15units qD  -resistant SSI -can incr coverage needs if needed    Eliseo Gum MSN, AGACNP-BC [place] 03/29/2021, 3:49 PM

## 2021-03-29 NOTE — Progress Notes (Signed)
This RN notified [facility] physician of critical lab results.   Date and time results received: 03/29/21 0630  Test:  Sodium Chloride  Critical Value:  Sodium: 164 Chloride: >130  Name of Provider Notified: Oletta Darter  Orders Received? Or Actions Taken?:  Orders received were to increase free water flushes every two hours from 164ml to 26ml.   Wyn Quaker, RN

## 2021-03-29 NOTE — Progress Notes (Signed)
[facility] Progress Note Patient Name: Danielle Payne DOB: 24-May-1946 MRN: [id number]   Date of Service  03/29/2021  HPI/Events of Note  Hypernatremia - Na+ this AM  = 164.  eICU Interventions  Will increase free water per tube to 200 mL Q 2 hours.     Intervention Category Major Interventions: Electrolyte abnormality - evaluation and management  Lysle Dingwall 03/29/2021, 6:53 AM

## 2021-03-29 NOTE — Progress Notes (Signed)
SLP Cancellation Note  Patient Details Name: Danielle Payne MRN: [id number] DOB: October 11, 1945   Cancelled treatment:        Documented agitation and now on Precedex. Will follow.    [name] 03/29/2021, 8:32 AM

## 2021-03-29 NOTE — Progress Notes (Addendum)
NAME:  Danielle Payne, MRN:  [id number], DOB:  1945-11-23, LOS: 6 ADMISSION DATE:  03/23/2021, CONSULTATION DATE: 03/26/2019 REFERRING MD:  Dr. Leonie Man, CHIEF COMPLAINT:  Acute onset of aphasia and altered mental status   History of Present Illness:  75 year old female with history of chronic atrial fibrillation, chronic combined systolic and diastolic congestive heart failure, hypertension and coronary artery disease who initially presented with sudden onset of nausea aphasia and altered mental status.  She was diagnosed with acute left MCA stroke, received tPA and was admitted to neurosurgical ICU for close monitoring. Over next 24 hours patient started getting lethargic, she had hemorrhagic changes and a left ischemic stroke with hematoma. She is being started on 3 Saline for cerebral edema with midline shift and, PCCM was consulted for help with management  Pertinent  Medical History   Past Medical History:  Diagnosis Date   Allergy    Asthma    Atrial fibrillation ([place])    Chronic combined systolic and diastolic heart failure ([place]) 05/14/2018   Coronary artery disease    Diverticulitis    Diverticulitis    Hypertension    Pure hypercholesterolemia 05/14/2018   Stented coronary artery      Significant Hospital Events: Including procedures, antibiotic start and stop dates in addition to other pertinent events   6/16 admitted s/p tPA 6/18, CT head showed increasing cerebral edema with hemorrhagic conversion and midline shift, PCCM was consulted 6/19 more alert while on HTS 6/20 still diffusely wheezing> added BDs and steroids. Stopped hypertonic saline for Na+>160   Interim History / Subjective:  Overnight MRI aborted due to agitation. Now very sleepy this morning.  Objective   Blood pressure 119/74, pulse 60, temperature 97.6 F (36.4 C), temperature source Axillary, resp. rate 19, height 5\' 3"  (1.6 m), weight 91 kg, SpO2 95 %.        Intake/Output Summary (Last 24 hours)  at 03/29/2021 1023 Last data filed at 03/29/2021 0600 Gross per 24 hour  Intake 1804.96 ml  Output 1500 ml  Net 304.96 ml   Filed Weights   03/25/21 0400 03/28/21 0500 03/29/21 0405  Weight: 90 kg 90 kg 91 kg    Examination:   Physical exam: General: ill appearing woman lying in bed in NAD HEENT: [place]/AT, eyes anicteric. Cortrak in place. Neuro: Sleeping, difficult to arouse, but resisting physical exam. PERRL. Withdraws both LE with painful stimulation. Not responding to verbal stimulation but groans with sternal rub. Chest: no wheezing today, less accessory muscle use, exaggerated respiratory excursion  Heart: reg rate, irreg rhythm, no murmur Abdomen: soft, obese, NT.  Skin: warm, dry, no rashes. Extremities: arm edema, no significant LE edema   Labs/imaging that I havepersonally reviewed  (right click and "Reselect all SmartList Selections" daily)  BMP, CBC, CBGs 6/22 Na 162, Cl > 130  Glu 156 BUN 54 Cr 0.98 WBC 19   Cx data reviewed, no growth bx ucx   CTH 6/21 evolving L sided cerebral infarcts with hemorrhagic conversion.   Resolved Hospital Problem list   AKI  Hypokalemia   Assessment & Plan:   Acute L MCA CVA s/p tpa, c/b hemorrhagic transformation Cytotoxic edema with midline shift Acute encephalopathy (stroke, likely hyperactive ICU delirium)  -will have to try MRI again when able to better control agitation -holding statin, aspirin -off 3%  -will add qHS seroquel. Starting at 25mg    Acute respiratory failure with hypoxia due to asthma exacerbation  -wean steroids to q12hr  -con't Q4h breathing treatments -  O2 as needed   HTN  -incr lisonopril to 40mg   -con't coreg  -wean off cardene -SBP goal < 160   Afib, chronic, not on anticoagulation  -coreg, dilt for rate control -no AC -tele monitoring  Leukocytosis -suspect this is in setting of steroids, which are beginning de-escalation 6/22 -trend, follow cx data  -no abx for now    Hypernatrmemia, iatrogenic -- hyperosmolar therapy -off 3% -trend Na, allowing to drift down   DM2, poorly controlled - A1c 8.8 Hyperglycemia -- acutely worse with steroids  -follow endotool  -suspect insulin gtt needs will decr with de-escalation of steroids   HLD -will start statin before discharge; delaying due to brain bleed  Dysphagia -EN via cortrak   GERD -PPI (esp in setting of steroids above)    Best Practice (right click and "Reselect all SmartList Selections" daily)   Diet/type: tubefeeds Pain/Anxiety/Delirium protocol Not indicated VAP protocol (if indicated): Not indicated DVT prophylaxis: SCD GI prophylaxis: PPI -- high dose steroids, high ICP Glucose control:  insulin gtt.  Central venous access:  N/A Arterial line:  N/A Foley:  N/A Mobility:  bed rest  PT consulted: Yes Code Status:  full code Last date of multidisciplinary goals of care discussion [Per primary team]. Family updated at bedside 6/22  Disposition: remains critically ill, will stay in intensive care -- If able to dc cardene gtt and remain with SBPs <160, will transfer to [facility] and ask TRH to take over as medical consultant     Labs   CBC: Recent Labs  Lab 03/23/21 1847 03/23/21 1858 03/25/21 0610 03/26/21 0300 03/26/21 1849 03/27/21 0424 03/28/21 0240 03/29/21 0410  WBC 8.8  --  17.0* 13.1*  --  14.4* 20.3* 19.0*  NEUTROABS 4.8  --   --   --   --   --   --   --   HGB 15.1*   < > 13.8 12.6 11.6* 12.2 12.1 12.5  HCT 46.0   < > 41.0 37.9 34.0* 38.0 37.2 39.1  MCV 92.0  --  88.0 90.7  --  93.1 93.0 93.5  PLT 233  --  238 181  --  218 245 238   < > = values in this interval not displayed.    Basic Metabolic Panel: Recent Labs  Lab 03/24/21 1650 03/25/21 0610 03/25/21 0921 03/25/21 1627 03/25/21 2022 03/26/21 0300 03/26/21 0907 03/26/21 1849 03/26/21 2041 03/27/21 0424 03/27/21 1238 03/27/21 1808 03/27/21 2031 03/28/21 0240 03/28/21 0921 03/29/21 0410  NA   --  135   < > 137   < > 148*  148*   < > 158*   < > 160*   < > 165* 165* 163* 165* 164*  K  --  3.4*  --   --   --  3.7  --  3.9  --  3.6  --   --   --  3.4*  --  3.5  CL  --  101  --   --   --  120*  --   --   --  129*  --   --   --  >130*  --  >130*  CO2  --  23  --   --   --  22  --   --   --  20*  --   --   --  21*  --  22  GLUCOSE  --  256*  --   --   --  204*  --   --   --  365*  --   --   --  188*  --  156*  BUN  --  34*  --   --   --  38*  --   --   --  50*  --   --   --  56*  --  54*  CREATININE  --  1.28*  --   --   --  1.02*  --   --   --  1.45*  --   --   --  1.22*  --  0.98  CALCIUM  --  9.1  --   --   --  8.4*  --   --   --  8.9  --   --   --  9.3  --  9.2  MG 1.8 2.1  --  2.1  --  2.2  --   --   --   --   --   --   --   --   --   --   PHOS 2.5 3.0  --  3.5  --  3.2  --   --   --   --   --   --   --   --   --   --    < > = values in this interval not displayed.   GFR: Estimated Creatinine Clearance: 52.3 mL/min (by C-G formula based on SCr of 0.98 mg/dL). Recent Labs  Lab 03/26/21 0300 03/27/21 0424 03/28/21 0240 03/29/21 0410  WBC 13.1* 14.4* 20.3* 19.0*    CRITICAL CARE Performed by: Cristal Generous   Total critical care time: 36 minutes  Critical care time was exclusive of separately billable procedures and treating other patients. Critical care was necessary to treat or prevent imminent or life-threatening deterioration.  Critical care was time spent personally by me on the following activities: development of treatment plan with patient and/or surrogate as well as nursing, discussions with consultants, evaluation of patient's response to treatment, examination of patient, obtaining history from patient or surrogate, ordering and performing treatments and interventions, ordering and review of laboratory studies, ordering and review of radiographic studies, pulse oximetry and re-evaluation of patient's condition.   Eliseo Gum MSN, AGACNP-BC [place] for pager  03/29/2021, 10:23 AM    Attending attestation:  Ms. Yore is a 75 y/o woman with a history of Afib, chronic HFrEF who presented with an acute stroke who developed a post-TPA hemorrhagic conversion. She remains somewhat confused and agitated but is more awake. Precedex had been started overnight and was turned off at the beginning of day shift.  BP 137/73 (BP Location: Left Arm)   Pulse 60   Temp 97.6 F (36.4 C) (Axillary)   Resp 15   Ht 5\' 3"  (1.6 m) Comment: per hx  Wt 91 kg   SpO2 93%   BMI 35.54 kg/m   Chronically ill appearing woman lying in bed, out of restraints Answers some questions appropriately, other speech is nonsensical. Tracking, short attention span. Moving all extremities spontaneously but not always on command.  S1S2, irreg rhythm, reg rate No wheezing, mild tachypnea but no accessory muscle use Skin warm, dry, no rashes  Assessment & plan:  CVA with hemorrhagic conversion, likely embolic due to Afib -eventually needs AC; not yet due to recent CT findings -PT, OT, SLP -BP control; increase lisinopril. Cleviprex PRN-- has been off for a few hours this afternoon. Will transition to PRN hydralazine and labetalol.  Agitation, delirium -discontinue precedex -seroquel QHS -wrap IVs, verbally redirect as much as possible -glasses on, frequent reorientation, family at bedside -PT, OT, SLP-- barium swallow today  Dysphagia, aphasia -appreciate SLP's assistance, MBSS today -con't cortrak until tolerating PO  Acute respiratory failure with hypoxia Acute asthma exacerbation -Wean O2 as able -Wean steroids; solumedrol Q12h -Con't scheduled bronchodilators. Recommend discharge on LABA-ICS (Breo, Advair, Wixella, Symbicort, Dulera) and albuterol PRN. At this point she does not seem like she could coordinate appropriate inhaler technique.  Leukocytosis- reactive from steroids. UA, CXR without source of infection.   Blood cx NGTD -Monitor. No antibiotics required  Hypergylcemia, poorly controlled DM -insulin infusion until prepared to transition fully to basal-bolus.  -decreasing steroids today, insulin requirements will decrease  Stable to transfer out of ICU this afternoon. TRH to assume care. Daughter updated at bedside.  This patient is critically ill with multiple organ system failure which requires frequent high complexity decision making, assessment, support, evaluation, and titration of therapies. This was completed through the application of advanced monitoring technologies and extensive interpretation of multiple databases. During this encounter critical care time was devoted to patient care services described in this note for 40 minutes.   Julian Hy, DO 03/29/21 2:58 PM [facility]

## 2021-03-29 NOTE — Progress Notes (Signed)
Physical Therapy Treatment Patient Details Name: Danielle Payne MRN: [id number] DOB: 1945-12-31 Today's Date: 03/29/2021    History of Present Illness The pt is a 75 yo female presenting 6/16 with acute onset dense receptive and expressive aphasia. tPA given on arrival (6/16) Imaging revealed L MCA infarcts with hypodensity in L occipital, frontal, and parietal lobe. Repeat CT on 6/17 with hemorrhagic conversion in L temporal and parietal lobes. PMH includes: Asthma, Afib, CHF, CAD, HTN, and diverticulitis.    PT Comments    Pt exhibiting drastic improvement compared to session on 6/20 with improvements noted in R sided strength, alertness, and participation. Pt communicative, but often phrases that do not make sense. Able to participate in functional tasks with gestural cues and hand over hand guidance. Requiring two person min-mod assist for functional mobility. Ambulating from bedside commode to chair (~5 ft) with bilateral handheld assist. Continues with right sided weakness in comparison to left, balance deficits, global aphasia, decreased cognition. Updated d/c plan to CIR in order to address and maximize functional mobility.   Follow Up Recommendations  CIR     Equipment Recommendations  Wheelchair (measurements PT);Wheelchair cushion (measurements PT);Rolling walker with 5" wheels;3in1 (PT)    Recommendations for Other Services       Precautions / Restrictions Precautions Precautions: Fall;Other (comment) Precaution Comments: R hemiplegia, global aphasia Restrictions Weight Bearing Restrictions: No    Mobility  Bed Mobility Overal bed mobility: Needs Assistance Bed Mobility: Supine to Sit Rolling: Min assist         General bed mobility comments: Gestural cues for intiation to progress to edge of bed, light minA at trunk to steady when coming upright    Transfers Overall transfer level: Needs assistance Equipment used: 2 person hand held assist Transfers: Sit  to/from [facility] Sit to Stand: Min assist;+2 physical assistance Stand pivot transfers: Mod assist;+2 physical assistance       General transfer comment: MinA + 2 to rise from edge of bed and pivot towards left onto [facility]. Hand over hand guidance for placement and to facilitate turning and direction.  Ambulation/Gait Ambulation/Gait assistance: Mod assist;+2 physical assistance Gait Distance (Feet): 5 Feet Assistive device: 2 person hand held assist Gait Pattern/deviations: Step-through pattern;Decreased stride length;Trunk flexed Gait velocity: decreased Gait velocity interpretation: <1.8 ft/sec, indicate of risk for recurrent falls General Gait Details: Pt ambulating distance from [facility] to chair with modA + 2, modest instability, handheld guidance to assist with turning and positioning in chair.   Stairs             Wheelchair Mobility    Modified Rankin (Stroke Patients Only) Modified Rankin (Stroke Patients Only) Pre-Morbid Rankin Score: No symptoms Modified Rankin: Severe disability     Balance Overall balance assessment: Needs assistance Sitting-balance support: Bilateral upper extremity supported;Feet supported Sitting balance-Leahy Scale: Poor Sitting balance - Comments: pt with minguardA to minA for truncal support edge of bed. Tendency for anterior/right lateral lean but does display balance reactions Postural control: Right lateral lean     Standing balance comment: unable to stand                            Cognition Arousal/Alertness: Awake/alert Behavior During Therapy: WFL for tasks assessed/performed Overall Cognitive Status: Difficult to assess Area of Impairment: Following commands                       Following Commands: Follows one  step commands inconsistently       General Comments: Pt alert and communicative; globally aphasic. She cannot name items or repeat after therapist, but did grab comb and brush  hair with hand over hand guidance. Able to complete mobility tasks i.e. sitting up, standing up with gestural cueing. Mentioning "[place]," in a sentence at one point; PT informed her she was in the hospital due to a stroke. Pt stating, "oh no." When sitting up in chair, pt stating, "this feels good." Smiling and laughing with therapist, holding hands.      Exercises      General Comments        Pertinent Vitals/Pain Pain Assessment: Faces Faces Pain Scale: No hurt    Home Living                      Prior Function            PT Goals (current goals can now be found in the care plan section) Acute Rehab PT Goals Patient Stated Goal: none stated PT Goal Formulation: All assessment and education complete, DC therapy Time For Goal Achievement: 04/10/21 Potential to Achieve Goals: Good Progress towards PT goals: Progressing toward goals    Frequency    Min 4X/week      PT Plan Discharge plan needs to be updated;Frequency needs to be updated    Co-evaluation              AM-PAC PT "6 Clicks" Mobility   Outcome Measure  Help needed turning from your back to your side while in a flat bed without using bedrails?: A Little Help needed moving from lying on your back to sitting on the side of a flat bed without using bedrails?: A Little Help needed moving to and from a bed to a chair (including a wheelchair)?: A Lot Help needed standing up from a chair using your arms (e.g., wheelchair or bedside chair)?: A Little Help needed to walk in hospital room?: A Lot Help needed climbing 3-5 steps with a railing? : Total 6 Click Score: 14    End of Session   Activity Tolerance: Patient tolerated treatment well Patient left: with call bell/phone within reach;in chair;Other (comment) (posey chair alarm belt) Nurse Communication: Mobility status PT Visit Diagnosis: Other abnormalities of gait and mobility (R26.89);Hemiplegia and hemiparesis Hemiplegia - Right/Left:  Right Hemiplegia - dominant/non-dominant: Dominant Hemiplegia - caused by: Cerebral infarction     Time: 1335-1401 PT Time Calculation (min) (ACUTE ONLY): 26 min  Charges:  $Therapeutic Activity: 23-37 mins                     Danielle Payne, PT, DPT Acute Rehabilitation Services Pager [phone] Office [phone]    Danielle Payne 03/29/2021, 2:46 PM

## 2021-03-29 NOTE — Progress Notes (Signed)
Modified Barium Swallow Progress Note  Patient Details  Name: Danielle Payne MRN: [id number] Date of Birth: January 28, 1946  Today's Date: 03/29/2021  Modified Barium Swallow completed.  Full report located under Chart Review in the Imaging Section.  Brief recommendations include the following:  Clinical Impression  Pt's oropharyngeal dysphagia is marked by incoordinated oral to pharyngeal transit and poorly timed laryngeal closure leading to mild aspiration. Xerostomia impacted oral transit with cracker and tongue base residue with thicker textures. Using a straw with nectar pt consumed larger sip which entered laryngeal vestibule pre closure, aspirated during swallow (trace) then pt immediately exhaled post swallow blowing bubbles into cup.  Delayed cough was present. Thin penetrated prior to swallow and appeared to spontaneously clear. Most consistencies reached valleculae or pyriform sinuses for >3-5 seconds before swallow onset. Coordination of swallows and respirations was decreased and apniec period perhaps shortened. She is impulsive and thin liquids not recommended at this time. Nectar thick via [employer], puree, pills crushed, full supervision and controlled environment with meals.   Swallow Evaluation Recommendations       SLP Diet Recommendations: Dysphagia 1 (Puree) solids;Nectar thick liquid   Liquid Administration via: Spoon   Medication Administration: Crushed with puree   Supervision: Full assist for feeding;Staff to assist with self feeding   Compensations: Slow rate;Small sips/bites;Clear throat intermittently   Postural Changes: Seated upright at 90 degrees   Oral Care Recommendations: Oral care BID        [name] 03/29/2021,2:53 PM

## 2021-03-29 NOTE — Progress Notes (Signed)
Pt noted to have blood in stool, NP notified. Will increase PPI dose.

## 2021-03-29 NOTE — Progress Notes (Signed)
RT note. Patient not requiring bipap at this time. Patient currently in on rm air sat 99% w/ stable VS. Bipap is in patients room if anything changes. RT will continue to monitor.

## 2021-03-29 NOTE — Progress Notes (Addendum)
[place] Progress Note Patient Name: Danielle Payne DOB: 1946/08/04 MRN: [id number]   Date of Service  03/29/2021  HPI/Events of Note  Severe agitation - Patient thrashing around in bed. Tossing legs over side rails. Blood glucose = 166. Ordered Haldol not effective. QTc interval = 0.38.   eICU Interventions  Plan: Low dose Precedex IV infusion (0.2 to 0.4 mcg/kg/min). Titrate to RASS = 0.      Intervention Category Major Interventions: Delirium, psychosis, severe agitation - evaluation and management  Justis Dupas [place] 03/29/2021, 4:53 AM

## 2021-03-30 DIAGNOSIS — E1169 Type 2 diabetes mellitus with other specified complication: Secondary | ICD-10-CM

## 2021-03-30 DIAGNOSIS — J45901 Unspecified asthma with (acute) exacerbation: Secondary | ICD-10-CM

## 2021-03-30 DIAGNOSIS — I10 Essential (primary) hypertension: Secondary | ICD-10-CM

## 2021-03-30 DIAGNOSIS — E785 Hyperlipidemia, unspecified: Secondary | ICD-10-CM

## 2021-03-30 LAB — BASIC METABOLIC PANEL
Anion gap: 8 (ref 5–15)
BUN: 42 mg/dL — ABNORMAL HIGH (ref 8–23)
CO2: 25 mmol/L (ref 22–32)
Calcium: 8.9 mg/dL (ref 8.9–10.3)
Chloride: 123 mmol/L — ABNORMAL HIGH (ref 98–111)
Creatinine, Ser: 0.9 mg/dL (ref 0.44–1.00)
GFR, Estimated: >60 mL/min (ref >60)
Glucose, Bld: 236 mg/dL — ABNORMAL HIGH (ref 70–99)
Potassium: 3.2 mmol/L — ABNORMAL LOW (ref 3.5–5.1)
Sodium: 156 mmol/L — ABNORMAL HIGH (ref 135–145)

## 2021-03-30 LAB — GLUCOSE, CAPILLARY
Glucose-Capillary: 191 mg/dL — ABNORMAL HIGH (ref 70–99)
Glucose-Capillary: 193 mg/dL — ABNORMAL HIGH (ref 70–99)
Glucose-Capillary: 345 mg/dL — ABNORMAL HIGH (ref 70–99)
Glucose-Capillary: 358 mg/dL — ABNORMAL HIGH (ref 70–99)
Glucose-Capillary: 295 mg/dL — ABNORMAL HIGH (ref 70–99)
Glucose-Capillary: 190 mg/dL — ABNORMAL HIGH (ref 70–99)

## 2021-03-30 MED ORDER — HYDRALAZINE HCL 10 MG PO TABS
10.0000 mg | ORAL_TABLET | Freq: Three times a day (TID) | ORAL | Status: DC
  Administered 2021-03-30 – 2021-04-01 (×6): 10 mg via ORAL
  Filled 2021-03-30 (×7): qty 1

## 2021-03-30 MED ORDER — GLIMEPIRIDE 4 MG PO TABS
4.0000 mg | ORAL_TABLET | Freq: Every day | ORAL | Status: DC
  Administered 2021-03-31 – 2021-04-01 (×2): 4 mg
  Filled 2021-03-30 (×2): qty 1

## 2021-03-30 NOTE — Progress Notes (Signed)
STROKE TEAM PROGRESS NOTE   SUBJECTIVE (INTERVAL HISTORY) No acute events overnight, she   was transferred to the neurology floor bed yesterday.  She is more alert and interactive today and is able to speak short sentences and several words but still not following commands consistently.  Vital signs are stable.  Neurological exam shows improvement. She is now off insulin drip but blood sugars remain elevated.  Blood pressure is also little elevated.  We will add glimepiride home diabetic medication to her sliding scale insulin and hydralazine 10 mg every 8 hourly for blood pressure.  Speech therapy has cleared patient for dysphagia 1 pure thick liquid diet.  Await inpatient rehab transfer.    OBJECTIVE Temp:  [97.5 F (36.4 C)-98.6 F (37 C)] 97.5 F (36.4 C) (06/23 1133) Pulse Rate:  [71-102] 76 (06/23 1200) Cardiac Rhythm: Atrial fibrillation (06/23 0730) Resp:  [16-21] 20 (06/23 1133) BP: (119-180)/(63-123) 171/93 (06/23 1133) SpO2:  [93 %-100 %] 100 % (06/23 1133)  Recent Labs  Lab 03/29/21 1939 03/29/21 2356 03/30/21 0408 03/30/21 0758 03/30/21 1132  GLUCAP 156* 176* 191* 193* 345*   Recent Labs  Lab 03/24/21 1650 03/25/21 0610 03/25/21 0921 03/25/21 1627 03/25/21 2022 03/26/21 0300 03/26/21 0907 03/26/21 1849 03/26/21 2041 03/27/21 0424 03/27/21 1238 03/27/21 2031 03/28/21 0240 03/28/21 0921 03/29/21 0410 03/30/21 0242  NA  --  135   < > 137   < > 148*  148*   < > 158*   < > 160*   < > 165* 163* 165* 164* 156*  K  --  3.4*  --   --   --  3.7  --  3.9  --  3.6  --   --  3.4*  --  3.5 3.2*  CL  --  101  --   --   --  120*  --   --   --  129*  --   --  >130*  --  >130* 123*  CO2  --  23  --   --   --  22  --   --   --  20*  --   --  21*  --  22 25  GLUCOSE  --  256*  --   --   --  204*  --   --   --  365*  --   --  188*  --  156* 236*  BUN  --  34*  --   --   --  38*  --   --   --  50*  --   --  56*  --  54* 42*  CREATININE  --  1.28*  --   --   --  1.02*  --    --   --  1.45*  --   --  1.22*  --  0.98 0.90  CALCIUM  --  9.1  --   --   --  8.4*  --   --   --  8.9  --   --  9.3  --  9.2 8.9  MG 1.8 2.1  --  2.1  --  2.2  --   --   --   --   --   --   --   --   --   --   PHOS 2.5 3.0  --  3.5  --  3.2  --   --   --   --   --   --   --   --   --   --    < > =  values in this interval not displayed.   Recent Labs  Lab 03/23/21 1847  AST 24  ALT 23  ALKPHOS 88  BILITOT 0.6  PROT 7.4  ALBUMIN 3.8   Recent Labs  Lab 03/23/21 1847 03/23/21 1858 03/25/21 0610 03/26/21 0300 03/26/21 1849 03/27/21 0424 03/28/21 0240 03/29/21 0410  WBC 8.8  --  17.0* 13.1*  --  14.4* 20.3* 19.0*  NEUTROABS 4.8  --   --   --   --   --   --   --   HGB 15.1*   < > 13.8 12.6 11.6* 12.2 12.1 12.5  HCT 46.0   < > 41.0 37.9 34.0* 38.0 37.2 39.1  MCV 92.0  --  88.0 90.7  --  93.1 93.0 93.5  PLT 233  --  238 181  --  218 245 238   < > = values in this interval not displayed.   No results for input(s): CKTOTAL, CKMB, CKMBINDEX, TROPONINI in the last 168 hours. No results for input(s): LABPROT, INR in the last 72 hours.  Recent Labs    03/28/21 0837  COLORURINE YELLOW  LABSPEC 1.024  PHURINE 5.0  GLUCOSEU 50*  HGBUR NEGATIVE  BILIRUBINUR NEGATIVE  KETONESUR 5*  PROTEINUR NEGATIVE  NITRITE NEGATIVE  LEUKOCYTESUR NEGATIVE        Component Value Date/Time   CHOL 126 03/24/2021 0510   CHOL 191 11/05/2019 0902   TRIG 1,219 (H) 03/24/2021 0510   HDL 27 (L) 03/24/2021 0510   HDL 39 (L) 11/05/2019 0902   CHOLHDL NOT REPORTED DUE TO HIGH TRIGLYCERIDES 03/24/2021 0510   VLDL UNABLE TO CALCULATE IF TRIGLYCERIDE OVER 400 mg/dL 03/24/2021 0510   LDLCALC UNABLE TO CALCULATE IF TRIGLYCERIDE OVER 400 mg/dL 03/24/2021 0510   LDLCALC 119 (H) 11/05/2019 0902   Lab Results  Component Value Date   HGBA1C 8.8 (H) 03/24/2021      Component Value Date/Time   LABOPIA NONE DETECTED 03/24/2021 0126   COCAINSCRNUR NONE DETECTED 03/24/2021 0126   LABBENZ NONE DETECTED  03/24/2021 0126   AMPHETMU NONE DETECTED 03/24/2021 0126   THCU NONE DETECTED 03/24/2021 0126   LABBARB NONE DETECTED 03/24/2021 0126    Recent Labs  Lab 03/23/21 1847  ETH <10    I have personally reviewed the radiological images below and agree with the radiology interpretations.  CT Head Wo Contrast  Result Date: 03/28/2021 CLINICAL DATA:  Stroke, follow-up EXAM: CT HEAD WITHOUT CONTRAST TECHNIQUE: Contiguous axial images were obtained from the base of the skull through the vertex without intravenous contrast. COMPARISON:  03/26/2021 FINDINGS: Brain: Areas of evolving recent infarction with hemorrhagic conversion again identified in the left frontal, parietal, and lateral occipital lobes. Similar associated mass effect remains mild. Unchanged small left temporal, likely subarachnoid hemorrhage. No new hemorrhage or loss of gray-white differentiation. Ventricles are stable in size. Vascular: No new findings. Skull: Calvarium is unremarkable. Sinuses/Orbits: No acute finding. Other: None. 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 Unchanged small left temporal, likely subarachnoid hemorrhage. No new hemorrhage or worsening mass effect. Electronically Signed   By: Macy Mis M.D.   On: 03/28/2021 10:43   CT HEAD WO CONTRAST  Result Date: 03/26/2021 CLINICAL DATA:  Stroke follow-up EXAM: CT HEAD WITHOUT CONTRAST TECHNIQUE: Contiguous axial images were obtained from the base of the skull through the vertex without intravenous contrast. COMPARISON:  CT head 03/25/2021 FINDINGS: Brain: Acute infarct in the left middle frontal lobe unchanged with minimal hemorrhage, unchanged. Acute infarct left  parietal lobe with hemorrhagic transformation unchanged from yesterday. Infarct also extends into the left occipital lobe hemorrhagic transformation unchanged. Small amount of hemorrhage in the left temporal lobe likely subarachnoid hemorrhage is unchanged. Slight midline shift  to the right unchanged. Negative for hydrocephalus. No new area of infarct. Vascular: Negative for hyperdense vessel Skull: Negative Sinuses/Orbits: Mucosal edema paranasal sinuses.  Negative orbit Other: None IMPRESSION: No change from yesterday. Acute infarct in the left MCA and left PCA territory with hemorrhagic transformation in the left occipital and parietal lobes. Acute infarct left middle frontal gyrus with minimal hemorrhage, unchanged. No new area of hemorrhage or infarct. Electronically Signed   By: Franchot Gallo M.D.   On: 03/26/2021 18:47   CT HEAD WO CONTRAST  Result Date: 03/25/2021 CLINICAL DATA:  75 year old female code stroke presentation status post IV tPA. Left MCA territory infarct with mild hemorrhagic changes. EXAM: CT HEAD WITHOUT CONTRAST TECHNIQUE: Contiguous axial images were obtained from the base of the skull through the vertex without intravenous contrast. COMPARISON:  Head CT 03/24/2021 and earlier. FINDINGS: Brain: Mixed cytotoxic edema and rounded, nodular areas of hemorrhage redemonstrated in the left MCA territory. The several dominant foci of blood in the posterior left frontal and left parietal lobes continuing toward the left occipital pole are stable and size and configuration since yesterday (e.g. Series 4, image 22). Partial effacement of the left lateral ventricle and 2-3 mm of rightward midline shift are stable. No intraventricular hemorrhage. Probable gyral petechial hemorrhage at the left temporal operculum is stable on series 4, image 18. No definite extra-axial hemorrhage. Right hemisphere and posterior fossa gray-white matter differentiation is stable and within normal limits. No ventriculomegaly. Basilar cisterns remain patent. Vascular: Calcified atherosclerosis at the skull base. Skull: No acute osseous abnormality identified. Sinuses/Orbits: Visualized paranasal sinuses and mastoids are stable and well aerated. Other: Stable right nasoenteric tube. Mild  leftward gaze is stable. Visualized scalp soft tissues are within normal limits. IMPRESSION: 1. Stable Left MCA infarct with hemorrhagic transformation since yesterday. Extension to the Left MCA/PCA watershed territory as before. Partially effaced left lateral ventricle and trace rightward midline shift are stable. No extra-axial extension of blood. 2. No new areas of involvement or new intracranial abnormality. Electronically Signed   By: Genevie Ann M.D.   On: 03/25/2021 05:19   CT HEAD WO CONTRAST  Result Date: 03/24/2021 CLINICAL DATA:  Stroke.  24 hours post tPA EXAM: CT HEAD WITHOUT CONTRAST TECHNIQUE: Contiguous axial images were obtained from the base of the skull through the vertex without intravenous contrast. COMPARISON:  CT head 03/23/2021 FINDINGS: Brain: Interval development of left MCA infarct with hypodensity in the left frontal and parietal lobe. There is also involvement of the left occipital lobe. There is hemorrhagic transformation of the infarct involving the left temporal and parietal lobe with a moderate amount of blood present. Ventricle size normal.  No midline shift. Vascular: Negative for hyperdense vessel Skull: Negative Sinuses/Orbits: Mucosal edema paranasal sinuses. Feeding tube in place. Air-fluid level sphenoid sinus. Negative orbit. Other: None IMPRESSION: Interval development of acute infarct in the left MCA territory with hypodensity in the left temporal and parietal lobe. This also extends into the left occipital lobe. There has been interval hemorrhagic transformation of the infarct with moderate amount of blood present within the infarction. Dr. Cheral Marker  has been paged to discuss the results. Electronically Signed   By: Franchot Gallo M.D.   On: 03/24/2021 18:30   DG CHEST PORT 1 VIEW  Result  Date: 03/28/2021 CLINICAL DATA:  Hypoxia.  Stroke. EXAM: PORTABLE CHEST 1 VIEW COMPARISON:  03/26/2021. 11/20/2018. FINDINGS: Feeding tube noted with tip below left hemidiaphragm.  Cardiomegaly again noted. Left atrial appendage clip in stable position. Pulmonary venous congestion. Bilateral interstitial prominence again noted. Findings suggest CHF. Low lung volumes. Small left pleural effusion may be present. No pneumothorax. Surgical clips left upper quadrant. IMPRESSION: 1.  Feeding tube noted with tip below left hemidiaphragm. 2. Cardiomegaly. Left atrial appendage clip in stable position. Bilateral interstitial prominence. Findings suggest congestive heart failure with interstitial edema. Small left pleural effusion may be present. Electronically Signed   By: Marcello Moores  Register   On: 03/28/2021 11:13   DG CHEST PORT 1 VIEW  Result Date: 03/26/2021 CLINICAL DATA:  Respiratory distress. EXAM: PORTABLE CHEST 1 VIEW COMPARISON:  Chest radiograph 03/24/2021. FINDINGS: Enteric tube courses inferior to the diaphragm. Stable cardiomegaly status post median sternotomy. Bilateral interstitial pulmonary opacities. No pleural effusion or pneumothorax. Thoracic spine degenerative changes. IMPRESSION: Cardiomegaly with mild interstitial opacities suggestive of edema. Electronically Signed   By: Lovey Newcomer M.D.   On: 03/26/2021 16:56   DG CHEST PORT 1 VIEW  Result Date: 03/24/2021 CLINICAL DATA:  Fevers EXAM: PORTABLE CHEST 1 VIEW COMPARISON:  11/20/2018 FINDINGS: Cardiac shadow is enlarged. Postsurgical changes are again seen. Feeding catheter is noted extending into the stomach. Lungs are hypoinflated. Mild vascular congestion is noted without parenchymal edema. Left basilar opacity is noted which may represent early infiltrate. IMPRESSION: Mild CHF without pulmonary edema. Left basilar opacity which may represent an early infiltrate. Electronically Signed   By: Inez Catalina M.D.   On: 03/24/2021 17:54   DG Abd Portable 1V  Result Date: 03/24/2021 CLINICAL DATA:  Feeding tube placement EXAM: PORTABLE ABDOMEN - 1 VIEW COMPARISON:  Portable exam 1314 hours without priors for comparison  FINDINGS: Tip of feeding tube projects over distal gastric antrum. Visualized bowel gas pattern normal. Atherosclerotic calcifications aorta and splenic artery. Prior median sternotomy and LEFT atrial appendage clipping. Subsegmental atelectasis at LEFT lung base. IMPRESSION: Tip of feeding tube projects over distal gastric antrum. Electronically Signed   By: Lavonia Dana M.D.   On: 03/24/2021 13:40   DG Swallowing Func-Speech Pathology  Result Date: 03/29/2021 Formatting of this result is different from the original. Objective Swallowing Evaluation: Type of Study: MBS-Modified Barium Swallow Study  Patient Details Name: Danielle Payne MRN: [id number] Date of Birth: 1946-02-14 Today's Date: 03/29/2021 Time: SLP Start Time (ACUTE ONLY): 46 -SLP Stop Time (ACUTE ONLY): 9937 SLP Time Calculation (min) (ACUTE ONLY): 16 min Past Medical History: Past Medical History: Diagnosis Date  Allergy   Asthma   Atrial fibrillation ([place])   Chronic combined systolic and diastolic heart failure ([place]) 05/14/2018  Coronary artery disease   Diverticulitis   Diverticulitis   Hypertension   Pure hypercholesterolemia 05/14/2018  Stented coronary artery  Past Surgical History: Past Surgical History: Procedure Laterality Date  ABDOMINAL HYSTERECTOMY    CARDIAC CATHETERIZATION    CARDIOVERSION N/A 12/21/2014  Procedure: CARDIOVERSION;  Surgeon: Adrian Prows, MD;  Location: [facility];  Service: Cardiovascular;  Laterality: N/A;  CORONARY ANGIOPLASTY    CORONARY ARTERY BYPASS GRAFT   HPI: Danielle Payne is an 75 y.o. female adm with aphasia - receptive and expressive - Found to have Left MCA CVA and is s/p tPa.   PMH + for atral fibrillation, asthma, chronic combined systolic and diastolic HF, CAD, HTN, hypercholesterolemia and CAD s/p angioplasty and CABG who presents with acute onset of  dense receptive and expressive aphasia.  CT showed  Interval development of acute infarct in the left MCA territory with hypodensity in the left temporal and  parietal lobe. This also extends into the left occipital lobe.  CXR 6/17 concerning for edema/Mild CHF, Left basilar opacity which may represent an early infiltrate. Pt has not passed  [facility] swallow screen and has not been ready for po.  Has Cortrak in place at this time.  Has been lethargic with some increased Respiratory demands and per RN, CCM has been consulted.  No data recorded Assessment / Plan / Recommendation CHL IP CLINICAL IMPRESSIONS 03/29/2021 Clinical Impression Pt's oropharyngeal dysphagia is marked by incoordinated oral to pharyngeal transit and poorly timed laryngeal closure leading to mild aspiration. Xerostomia impacted oral transit with cracker and tongue base residue with thicker textures. Using a straw with nectar pt consumed larger sip which entered laryngeal vestibule pre closure, aspirated during swallow (trace) then pt immediately exhaled post swallow blowing bubbles into cup.  Delayed cough was present. Thin penetrated prior to swallow and appeared to spontaneously clear. Most consistencies reached valleculae or pyriform sinuses for >3-5 seconds before swallow onset. Coordination of swallows and respirations was decreased and apniec period perhaps shortened. She is impulsive and thin liquids not recommended at this time. Nectar thick via TEASPOON, puree, pills crushed, full supervision and controlled environment with meals. SLP Visit Diagnosis Dysphagia, oropharyngeal phase (R13.12) Attention and concentration deficit following -- Frontal lobe and executive function deficit following -- Impact on safety and function Moderate aspiration risk   CHL IP TREATMENT RECOMMENDATION 03/29/2021 Treatment Recommendations Therapy as outlined in treatment plan below   Prognosis 03/29/2021 Prognosis for Safe Diet Advancement Good Barriers to Reach Goals -- Barriers/Prognosis Comment -- CHL IP DIET RECOMMENDATION 03/29/2021 SLP Diet Recommendations Dysphagia 1 (Puree) solids;Nectar thick liquid Liquid  Administration via Spoon Medication Administration Crushed with puree Compensations Slow rate;Small sips/bites;Clear throat intermittently Postural Changes Seated upright at 90 degrees   CHL IP OTHER RECOMMENDATIONS 03/29/2021 Recommended Consults -- Oral Care Recommendations Oral care BID Other Recommendations --   CHL IP FOLLOW UP RECOMMENDATIONS 03/29/2021 Follow up Recommendations Inpatient Rehab   CHL IP FREQUENCY AND DURATION 03/29/2021 Speech Therapy Frequency (ACUTE ONLY) min 2x/week Treatment Duration 2 weeks      CHL IP ORAL PHASE 03/29/2021 Oral Phase Impaired Oral - Pudding Teaspoon -- Oral - Pudding Cup -- Oral - Honey Teaspoon -- Oral - Honey Cup Reduced posterior propulsion Oral - Nectar Teaspoon -- Oral - Nectar Cup Reduced posterior propulsion;Lingual/palatal residue Oral - Nectar Straw Reduced posterior propulsion;Lingual/palatal residue Oral - Thin Teaspoon -- Oral - Thin Cup Right anterior bolus loss Oral - Thin Straw -- Oral - Puree Reduced posterior propulsion;Lingual/palatal residue Oral - Mech Soft -- Oral - Regular Reduced posterior propulsion;Other (Comment) Oral - Multi-Consistency -- Oral - Pill -- Oral Phase - Comment --  CHL IP PHARYNGEAL PHASE 03/29/2021 Pharyngeal Phase Impaired Pharyngeal- Pudding Teaspoon -- Pharyngeal -- Pharyngeal- Pudding Cup -- Pharyngeal -- Pharyngeal- Honey Teaspoon -- Pharyngeal -- Pharyngeal- Honey Cup WFL Pharyngeal -- Pharyngeal- Nectar Teaspoon -- Pharyngeal -- Pharyngeal- Nectar Cup Delayed swallow initiation-pyriform sinuses;Pharyngeal residue - valleculae;Inter-arytenoid space residue Pharyngeal -- Pharyngeal- Nectar Straw Penetration/Aspiration before swallow;Delayed swallow initiation-pyriform sinuses Pharyngeal Material enters airway, passes BELOW cords and not ejected out despite cough attempt by patient Pharyngeal- Thin Teaspoon -- Pharyngeal -- Pharyngeal- Thin Cup Penetration/Aspiration before swallow Pharyngeal Material enters airway, remains ABOVE  vocal cords then ejected out Pharyngeal- Thin Straw -- Pharyngeal --  Pharyngeal- Puree Delayed swallow initiation-vallecula Pharyngeal -- Pharyngeal- Mechanical Soft -- Pharyngeal -- Pharyngeal- Regular Pharyngeal residue - valleculae Pharyngeal -- Pharyngeal- Multi-consistency -- Pharyngeal -- Pharyngeal- Pill -- Pharyngeal -- Pharyngeal Comment --  CHL IP CERVICAL ESOPHAGEAL PHASE 03/29/2021 Cervical Esophageal Phase (No Data) Pudding Teaspoon -- Pudding Cup -- Honey Teaspoon -- Honey Cup -- Nectar Teaspoon -- Nectar Cup -- Nectar Straw -- Thin Teaspoon -- Thin Cup -- Thin Straw -- Puree -- Mechanical Soft -- Regular -- Multi-consistency -- Pill -- Cervical Esophageal Comment -- [name] 03/29/2021, 2:53 PM              EEG adult  Result Date: 03/26/2021 Lora Havens, MD     03/26/2021  9:04 PM Patient Name: Danielle Payne MRN: [id number] Epilepsy Attending: Lora Havens Referring Physician/Provider: Dr Amie [place] Date: 03/26/2021 Duration: 24.33 mins Patient history: 75yo F with L MCA stroke, continues to be altered. EEG to evaluate for status epilepticus. Level of alertness: lethargic AEDs during EEG study: None Technical aspects: This EEG was obtained using a 10 lead EEG system positioned circumferentially without any parasagittal coverage (rapid EEG). Computer selected EEG is reviewed as well as background features and all clinically significant events.  Description: EEG showed continuous generalized and lateralized left hemisphere polymorphic 3 to 6 Hz theta-delta slowing.  Hyperventilation and photic stimulation were not performed.   ABNORMALITY - Continuous slow, generalized and lateralized left hemisphere IMPRESSION: This study is  suggestive of cortical dysfunction arising from left hemisphere, likely secondary to underlying stroke as well as severe diffuse encephalopathy, nonspecific etiology. No seizures or epileptiform discharges were seen throughout the recording. Dr Rory Percy was  notified. Lora Havens   ECHOCARDIOGRAM COMPLETE  Result Date: 03/24/2021    ECHOCARDIOGRAM REPORT   Patient Name:   Danielle Payne Date of Exam: 03/24/2021 Medical Rec #:  [id number]       Height:       63.0 in Accession #:    [id number]      Weight:       203.7 lb Date of Birth:  October 16, 1945      BSA:          1.949 m Patient Age:    87 years        BP:           169/83 mmHg Patient Gender: F               HR:           109 bpm. Exam Location:  Inpatient Procedure: 2D Echo Indications:    stroke  History:        Patient has prior history of Echocardiogram examinations, most                 recent 05/26/2018. CAD, Arrythmias:Atrial Fibrillation; Risk                 Factors:Diabetes and Hypertension.  Sonographer:    Johny Chess Referring Phys: [id number] HUNTER J COLLINS  Sonographer Comments: Suboptimal apical window. Image acquisition challenging due to uncooperative patient, Image acquisition challenging due to patient body habitus and restraints. kept moving. IMPRESSIONS  1. Left ventricular ejection fraction, by estimation, is 60 to 65%. The left ventricle has normal function. The left ventricle has no regional wall motion abnormalities. Left ventricular diastolic function could not be evaluated.  2. Right ventricular systolic function is normal. The right ventricular size is normal.  3. The mitral valve is grossly  normal. No evidence of mitral valve regurgitation. No evidence of mitral stenosis.  4. The aortic valve is grossly normal. Aortic valve regurgitation is mild. No aortic stenosis is present. FINDINGS  Left Ventricle: Left ventricular ejection fraction, by estimation, is 60 to 65%. The left ventricle has normal function. The left ventricle has no regional wall motion abnormalities. The left ventricular internal cavity size was normal in size. There is  borderline left ventricular hypertrophy. Left ventricular diastolic function could not be evaluated due to atrial fibrillation. Left  ventricular diastolic function could not be evaluated. Right Ventricle: The right ventricular size is normal. Right vetricular wall thickness was not well visualized. Right ventricular systolic function is normal. Left Atrium: Left atrial size was normal in size. Right Atrium: Right atrial size was not well visualized. Pericardium: There is no evidence of pericardial effusion. Mitral Valve: The mitral valve is grossly normal. No evidence of mitral valve regurgitation. No evidence of mitral valve stenosis. Tricuspid Valve: The tricuspid valve is grossly normal. Tricuspid valve regurgitation is not demonstrated. Aortic Valve: The aortic valve is grossly normal. Aortic valve regurgitation is mild. No aortic stenosis is present. Pulmonic Valve: The pulmonic valve was grossly normal. Pulmonic valve regurgitation is not visualized. Aorta: The aortic root and ascending aorta are structurally normal, with no evidence of dilitation. IAS/Shunts: The atrial septum is grossly normal.  LEFT VENTRICLE PLAX 2D LVIDd:         4.60 cm LVIDs:         3.40 cm LV PW:         1.10 cm LV IVS:        1.30 cm LVOT diam:     1.90 cm LVOT Area:     2.84 cm  IVC IVC diam: 2.20 cm LEFT ATRIUM           Index LA diam:      4.40 cm 2.26 cm/m LA Vol (A4C): 60.2 ml 30.89 ml/m   AORTA Ao Root diam: 2.80 cm Ao Asc diam:  3.20 cm  SHUNTS Systemic Diam: 1.90 cm Mertie Moores MD Electronically signed by Mertie Moores MD Signature Date/Time: 03/24/2021/5:19:01 PM    Final    CT HEAD CODE STROKE WO CONTRAST  Result Date: 03/23/2021 CLINICAL DATA:  Code stroke.  Acute neuro deficit.  Aphasia EXAM: CT HEAD WITHOUT CONTRAST TECHNIQUE: Contiguous axial images were obtained from the base of the skull through the vertex without intravenous contrast. COMPARISON:  None. FINDINGS: Brain: Mild atrophy and mild white matter hypodensity bilaterally. Negative for acute infarct, hemorrhage, mass. Vascular: Negative for hyperdense vessel Skull: Negative  Sinuses/Orbits: Mild mucosal edema paranasal sinuses. Negative orbit Other: None ASPECTS ([place] Stroke Program Early CT Score) - Ganglionic level infarction (caudate, lentiform nuclei, internal capsule, insula, M1-M3 cortex): 7 - Supraganglionic infarction (M4-M6 cortex): 3 Total score (0-10 with 10 being normal): 10 IMPRESSION: 1. No acute abnormality. 2. ASPECTS is 10 3. Code stroke imaging results were communicated on 03/23/2021 at 6:59 pm to provider [place] via text page Electronically Signed   By: Franchot Gallo M.D.   On: 03/23/2021 19:00   CT ANGIO HEAD CODE STROKE  Result Date: 03/23/2021 CLINICAL DATA:  Expressive aphasia EXAM: CT ANGIOGRAPHY HEAD AND NECK TECHNIQUE: Multidetector CT imaging of the head and neck was performed using the standard protocol during bolus administration of intravenous contrast. Multiplanar CT image reconstructions and MIPs were obtained to evaluate the vascular anatomy. Carotid stenosis measurements (when applicable) are obtained utilizing NASCET criteria, using the  distal internal carotid diameter as the denominator. CONTRAST:  59mL OMNIPAQUE IOHEXOL 350 MG/ML SOLN COMPARISON:  None. FINDINGS: CTA NECK FINDINGS SKELETON: There is no bony spinal canal stenosis. No lytic or blastic lesion. OTHER NECK: Normal pharynx, larynx and major salivary glands. No cervical lymphadenopathy. Unremarkable thyroid gland. UPPER CHEST: No pneumothorax or pleural effusion. No nodules or masses. AORTIC ARCH: There is calcific atherosclerosis of the aortic arch. There is no aneurysm, dissection or hemodynamically significant stenosis of the visualized portion of the aorta. Conventional 3 vessel aortic branching pattern. The visualized proximal subclavian arteries are widely patent. RIGHT CAROTID SYSTEM: No dissection, occlusion or aneurysm. Mild atherosclerotic calcification at the carotid bifurcation without hemodynamically significant stenosis. LEFT CAROTID SYSTEM: No dissection, occlusion or  aneurysm. Mild atherosclerotic calcification at the carotid bifurcation without hemodynamically significant stenosis. VERTEBRAL ARTERIES: Left dominant configuration. Both origins are clearly patent. There is no dissection, occlusion or flow-limiting stenosis to the skull base (V1-V3 segments). CTA HEAD FINDINGS POSTERIOR CIRCULATION: --Vertebral arteries: Normal V4 segments. --Inferior cerebellar arteries: Normal. --Basilar artery: Normal. --Superior cerebellar arteries: Normal. --Posterior cerebral arteries (PCA): Normal. ANTERIOR CIRCULATION: --Intracranial internal carotid arteries: Normal. --Anterior cerebral arteries (ACA): Normal. Both A1 segments are present. Patent anterior communicating artery (a-comm). --Middle cerebral arteries (MCA): Normal. VENOUS SINUSES: As permitted by contrast timing, patent. ANATOMIC VARIANTS: None Review of the MIP images confirms the above findings. IMPRESSION: 1. No emergent large vessel occlusion or high-grade stenosis of the intracranial arteries. 2. Mild bilateral carotid bifurcation atherosclerosis without hemodynamically significant stenosis by NASCET criteria. Aortic Atherosclerosis (ICD10-I70.0). Electronically Signed   By: Ulyses Jarred M.D.   On: 03/23/2021 19:38   CT ANGIO NECK CODE STROKE  Result Date: 03/23/2021 : CLINICAL DATA:   Expressive aphasia EXAM: CT ANGIOGRAPHY HEAD AND NECK TECHNIQUE: Multidetector CT imaging of the head and neck was performed using the standard protocol during bolus administration of intravenous contrast. Multiplanar CT image reconstructions and MIPs were obtained to evaluate the vascular anatomy. Carotid stenosis measurements (when applicable) are obtained utilizing NASCET criteria, using the distal internal carotid diameter as the denominator. CONTRAST:   87mL OMNIPAQUE IOHEXOL 350 MG/ML SOLN COMPARISON:   None. FINDINGS: CTA NECK FINDINGS SKELETON: There is no bony spinal canal stenosis. No lytic or blastic lesion. OTHER NECK:  Normal pharynx, larynx and major salivary glands. No cervical lymphadenopathy. Unremarkable thyroid gland. UPPER CHEST: No pneumothorax or pleural effusion. No nodules or masses. AORTIC ARCH: There is calcific atherosclerosis of the aortic arch. There is no aneurysm, dissection or hemodynamically significant stenosis of the visualized portion of the aorta. Conventional 3 vessel aortic branching pattern. The visualized proximal subclavian arteries are widely patent. RIGHT CAROTID SYSTEM: No dissection, occlusion or aneurysm. Mild atherosclerotic calcification at the carotid bifurcation without hemodynamically significant stenosis. LEFT CAROTID SYSTEM: No dissection, occlusion or aneurysm. Mild atherosclerotic calcification at the carotid bifurcation without hemodynamically significant stenosis. VERTEBRAL ARTERIES: Left dominant configuration. Both origins are clearly patent. There is no dissection, occlusion or flow-limiting stenosis to the skull base (V1-V3 segments). CTA HEAD FINDINGS POSTERIOR CIRCULATION: --Vertebral arteries: Normal V4 segments. --Inferior cerebellar arteries: Normal. --Basilar artery: Normal. --Superior cerebellar arteries: Normal. --Posterior cerebral arteries (PCA): Normal. ANTERIOR CIRCULATION: --Intracranial internal carotid arteries: Normal. --Anterior cerebral arteries (ACA): Normal. Both A1 segments are present. Patent anterior communicating artery (a-comm). --Middle cerebral arteries (MCA): Normal. VENOUS SINUSES: As permitted by contrast timing, patent. ANATOMIC VARIANTS: None Review of the MIP images confirms the above findings. IMPRESSION: 1. No emergent large vessel occlusion or high-grade stenosis  of the intracranial arteries. 2. Mild bilateral carotid bifurcation atherosclerosis without hemodynamically significant stenosis by NASCET criteria. Aortic Atherosclerosis (ICD10-I70.0). Electronically Signed   By: Ulyses Jarred M.D.   On: 03/23/2021 19:45      PHYSICAL EXAM  Temp:   [97.5 F (36.4 C)-98.6 F (37 C)] 97.5 F (36.4 C) (06/23 1133) Pulse Rate:  [71-102] 76 (06/23 1200) Resp:  [16-21] 20 (06/23 1133) BP: (119-180)/(63-123) 171/93 (06/23 1133) SpO2:  [93 %-100 %] 100 % (06/23 1133) Bilateral wheezing General -obese elderly Caucasian lady.  Not in distress.  Ophthalmologic - fundi not visualized due to noncooperation.  Cardiovascular - irregularly irregular heart rate and rhythm with afib on tele  Neuro - Alert. Sitting up in bed with her eyes open and is attentive to examiner. Global aphasia but speaks occasional words and short sentences and difficult to understand.  Now able to follow simple with pantomime. Left gaze preference, barely cross midline, tracking on the left, inconsistently blinking to visual threat on the left but not blinking to the right. Right mild facial droop. Tongue protrusion not cooperative.  Able to move right upper extremity against gravity but mild weakness of grip and intrinsic hand muscles. LUE antigravity. BLE withdraws to noxious with left more than right. UTA sensation and coordination given aphasia. Gait deferred due to right sided weakness.   ASSESSMENT/PLAN Ms. Cathren Sween is a 75 y.o. female with history of A. fib off AC, CHF, CAD, hypertension, hyperlipidemia, CAD status post CABG admitted for global aphasia.  tPA given.  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 fib not on AC Resultant global aphasia with right hemiparesis CT no acute abnormality CTA head and neck no LVO 2D Echo EF 60 to 65% LDL 80.9 HgbA1c 8.8  HTS for cerebral edema, NA goal 150-155, now off due to serum Na 163. Free water 100 ml Q2 initiated 6/21 to help with hypernatremia. Increased from 100 Q2 to 200 Q2 on 6/22. SCDs for VTE prophylaxis  No antithrombotic prior to admission, now on No antithrombotic within 24 hours of tPA due to hemorrhagic transformation Ongoing aggressive stroke risk factor  management Therapy recommendations: Pending Disposition: Pending  Chronic A. fib with RVR Bradycardia to 44s Follows with Dr. Oval Linsey on 01/28/20 Home meds including Coreg and Cardizem on board Metoprolol IV as needed Will restart home meds once po access Cardiology consulted for assistance; continuing Coreg 25 BID for rate control per their recommendations  Was on Coumadin before but taken off by patient self "concern that it was stiffening her arteries" "unwilling to take Eliquis or Xarelto because she does not know "what is in them."   taking a natural blood thinner sold over-the-counter upon the recommendation of her herbalist. Have discussed initiating a DOAC after 5 to 7 days for atrial fibrillation with daughter- she is in agreement   Hypertension Unstable, high On Cardene drip, Coreg, Lisinopril. Trying to wean off Cardene at this time.  Lisinopril increased from 20 to 40 on 6/22 for better SBP control  SBP goal is normotensive at this time   DM2 with uncontrolled hyperglycemia On Insulin drip per CCM Appreciate CCM assistance A1C 8.8, uncontrolled with goal of less than 7.0 to control stroke risk factor        Asthma Exacerbation Required BiPAP overnight  Management per CCM, appreciated  Hyperlipidemia Home meds: None  LDL 80.9, goal < 70 Consider statin once p.o. access Continue statin at discharge  Dysphagia  Speech following-cleared for dysphagia 1 pured thick liquid diet 03/30/2021 Cortrak for enteral nutrition         AKI CKD Monitor renal function  Renally does medications  Other Stroke Risk Factors Advanced age Obesity, Body mass index is 35.54 kg/m.  Coronary artery disease status post CABG CHF  Other Active Problems TG 1219, likely contamination from Cleviprex  Continue mobilize out of bed and therapies.  Start dysphagia 1 diet.  Encourage oral intake.  Continue ongoing physical speech and occupational therapies.  Resume glimepiride home dose  for diabetes control and add hydralazine for blood pressure control.  Await transfer to inpatient rehab when bed available after insurance approval.  Discussed with granddaughter at the bedside and answered questions.  Greater than 50% time during this 25-minute visit was spent in counseling and coordination of care about stroke and dysphagia and answering questions and discussion with care team     Antony Contras, MD  To contact Stroke Continuity provider, please refer to [email or web address] After hours, contact General Neurology

## 2021-03-30 NOTE — Plan of Care (Signed)
  Problem: Safety: Goal: Non-violent Restraint(s) Outcome: Progressing   Problem: Education: Goal: Knowledge of General Education information will improve Description: Including pain rating scale, medication(s)/side effects and non-pharmacologic comfort measures Outcome: Progressing   Problem: Health Behavior/Discharge Planning: Goal: Ability to manage health-related needs will improve Outcome: Progressing   Problem: Clinical Measurements: Goal: Ability to maintain clinical measurements within normal limits will improve Outcome: Progressing Goal: Will remain free from infection Outcome: Progressing Goal: Diagnostic test results will improve Outcome: Progressing Goal: Respiratory complications will improve Outcome: Progressing Goal: Cardiovascular complication will be avoided Outcome: Progressing   Problem: Activity: Goal: Risk for activity intolerance will decrease Outcome: Progressing   Problem: Nutrition: Goal: Adequate nutrition will be maintained Outcome: Progressing   Problem: Coping: Goal: Level of anxiety will decrease Outcome: Progressing   Problem: Elimination: Goal: Will not experience complications related to bowel motility Outcome: Progressing Goal: Will not experience complications related to urinary retention Outcome: Progressing   Problem: Pain Managment: Goal: General experience of comfort will improve Outcome: Progressing   Problem: Safety: Goal: Ability to remain free from injury will improve Outcome: Progressing   Problem: Skin Integrity: Goal: Risk for impaired skin integrity will decrease Outcome: Progressing   Problem: Education: Goal: Knowledge of disease or condition will improve Outcome: Progressing Goal: Knowledge of secondary prevention will improve Outcome: Progressing Goal: Knowledge of patient specific risk factors addressed and post discharge goals established will improve Outcome: Progressing Goal: Individualized  Educational Video(s) Outcome: Progressing   Problem: Coping: Goal: Will verbalize positive feelings about self Outcome: Progressing Goal: Will identify appropriate support needs Outcome: Progressing   Problem: Health Behavior/Discharge Planning: Goal: Ability to manage health-related needs will improve Outcome: Progressing   Problem: Self-Care: Goal: Ability to participate in self-care as condition permits will improve Outcome: Progressing Goal: Verbalization of feelings and concerns over difficulty with self-care will improve Outcome: Progressing Goal: Ability to communicate needs accurately will improve Outcome: Progressing   Problem: Nutrition: Goal: Risk of aspiration will decrease Outcome: Progressing Goal: Dietary intake will improve Outcome: Progressing   Problem: Intracerebral Hemorrhage Tissue Perfusion: Goal: Complications of Intracerebral Hemorrhage will be minimized Outcome: Progressing   Problem: Ischemic Stroke/TIA Tissue Perfusion: Goal: Complications of ischemic stroke/TIA will be minimized Outcome: Progressing   Problem: Spontaneous Subarachnoid Hemorrhage Tissue Perfusion: Goal: Complications of Spontaneous Subarachnoid Hemorrhage will be minimized Outcome: Progressing

## 2021-03-30 NOTE — Progress Notes (Addendum)
  Speech Language Pathology Treatment: Dysphagia;Cognitive-Linquistic  Patient Details Name: Danielle Payne MRN: [id number] DOB: 1946/01/04 Today's Date: 03/30/2021 Time: 5188-4166 SLP Time Calculation (min) (ACUTE ONLY): 22 min  Assessment / Plan / Recommendation Clinical Impression  Danielle Payne easily woken and alert after repositioning and participatory in therapy with her Danielle Payne at bedside. Therapist reviewed MBS yesterday, reasoning for recommendations and aspects of her aphasia.   Last week pt's expression consisted of perseveration of "okay". Presently her aphasia is showing features of [place] marked by fluent responses, no awareness of neologisms or paraphasias and inability to repeat words. Intermittently she relayed message with accurate words mixed with neologistic utterances. SLP facilitated expression automatic language (counting) with visual cues although unable to imitate or state with repetition. Watched video of [place] and pt sang with fair tune and approximation of a few words. Unable to follow commands with verbal or visual cues this session.   Grasping assist given with spoon and coordination to hold in liquid container in left and bring spoon to mouth. Agnosia present with attempting to drink from pudding cup. She is impulsive, needing full assist and hand over hand to decrease rate. Mild level of frustration but receptive to therapists intervention. No cough, throat clear or wet vocal quality. Reiterated to Danielle Payne reason for spoon presentation of liquids versus cup which she stated understanding. Continue puree, nectar thick liquids via SPOON and FULL assist with all meals/snacks.    HPI HPI: Danielle Payne is an 75 y.o. female adm with aphasia - receptive and expressive - Found to have Left MCA CVA and is s/p tPa.   PMH + for atral fibrillation, asthma, chronic combined systolic and diastolic HF, CAD, HTN, hypercholesterolemia and CAD s/p angioplasty  and CABG who presents with acute onset of dense receptive and expressive aphasia.  CT showed  Interval development of acute infarct in the left MCA territory with hypodensity in the left temporal and parietal lobe. This also extends into the left occipital lobe.  CXR 6/17 concerning for edema/Mild CHF, Left basilar opacity which may represent an early infiltrate. Pt has not passed  [place] swallow screen and has not been ready for po.  Has Cortrak in place at this time.  Has been lethargic with some increased Respiratory demands and per RN, CCM has been consulted.      SLP Plan  Continue with current plan of care       Recommendations  Diet recommendations: Dysphagia 1 (puree);Nectar-thick liquid Liquids provided via: Teaspoon Medication Administration: Crushed with puree Supervision: Staff to assist with self feeding;Full supervision/cueing for compensatory strategies Compensations: Minimize environmental distractions;Slow rate;Small sips/bites;Clear throat intermittently Postural Changes and/or Swallow Maneuvers: Seated upright 90 degrees                Oral Care Recommendations: Oral care BID Follow up Recommendations: Inpatient Rehab SLP Visit Diagnosis: Dysphagia, unspecified (R13.10);Aphasia (R47.01) Plan: Continue with current plan of care                       [name] 03/30/2021, 12:05 PM  [name] Caroli.Ed Risk analyst [phone] Office [phone]

## 2021-03-30 NOTE — Progress Notes (Signed)
Physical Therapy Treatment Patient Details Name: Danielle Payne MRN: [id number] DOB: 1945/12/02 Today's Date: 03/30/2021    History of Present Illness 75 yo female presenting 6/16 with acute onset dense receptive and expressive aphasia. tPA given on arrival (6/16) Imaging revealed L MCA infarcts with hypodensity in L occipital, frontal, and parietal lobe. Repeat CT on 6/17 with hemorrhagic conversion in L temporal and parietal lobes. PMH includes: Asthma, Afib, CHF, CAD, HTN, and diverticulitis.    PT Comments    Patient progressing towards physical therapy goals. Patient continues to be limited by R weakness, aphasia, impaired balance, and decreased safety awareness. Patient impulsive and requires direct cueing. Patient required mod-maxA+2 for ambulation with HHAx2 for 10' x 2. Patient required mod-maxA to maintain standing balance at sink. Continue to recommend comprehensive inpatient rehab (CIR) for post-acute therapy needs.     Follow Up Recommendations  CIR     Equipment Recommendations  Wheelchair (measurements PT);Wheelchair cushion (measurements PT);Rolling Danielle Payne with 5" wheels;3in1 (PT)    Recommendations for Other Services       Precautions / Restrictions Precautions Precautions: Fall;Other (comment) Precaution Comments: R hemiplegia, global aphasia    Mobility  Bed Mobility Overal bed mobility: Needs Assistance Bed Mobility: Supine to Sit;Sit to Supine     Supine to sit: Mod assist;+2 for physical assistance;+2 for safety/equipment;HOB elevated Sit to supine: Min guard   General bed mobility comments: Mod A +2 to intiating bed mobility to sit at EOB. Min Guard A for safety with returning to bed    Transfers Overall transfer level: Needs assistance Equipment used: 2 person hand held assist Transfers: Sit to/from [facility] Sit to Stand: Min assist;+2 physical assistance;Mod assist         General transfer comment: MinA + 2 to rise from  edge of bed. Mod A for safe descent to toilet and MIn A for power up into standing from toilet  Ambulation/Gait Ambulation/Gait assistance: Mod assist;+2 physical assistance;+2 safety/equipment Gait Distance (Feet): 10 Feet (x10') Assistive device: 2 person hand held assist Gait Pattern/deviations: Step-through pattern;Decreased stride length;Trunk flexed;Decreased weight shift to left;Decreased weight shift to right;Decreased step length - right;Wide base of support Gait velocity: decreased   General Gait Details: decreased safety awareness with ambulation. Easily distracted. ModA+2 to maintain balance and guiding due to decreased attention   Stairs             Wheelchair Mobility    Modified Rankin (Stroke Patients Only) Modified Rankin (Stroke Patients Only) Pre-Morbid Rankin Score: No symptoms Modified Rankin: Severe disability     Balance Overall balance assessment: Needs assistance Sitting-balance support: Feet supported;No upper extremity supported Sitting balance-Leahy Scale: Fair     Standing balance support: During functional activity;Bilateral upper extremity supported Standing balance-Leahy Scale: Poor Standing balance comment: Reliant on Mod-Max A for standing balance                            Cognition Arousal/Alertness: Awake/alert Behavior During Therapy: WFL for tasks assessed/performed Overall Cognitive Status: Difficult to assess Area of Impairment: Following commands;Problem solving;Awareness;Safety/judgement;Memory;Attention                   Current Attention Level: Focused;Sustained Memory: Decreased short-term memory Following Commands: Follows one step commands with increased time;Follows one step commands inconsistently Safety/Judgement: Decreased awareness of safety;Decreased awareness of deficits Awareness: Intellectual Problem Solving: Slow processing;Requires verbal cues;Difficulty sequencing General Comments: Pt  with expressive (and possibly receptive) aphasia; able  to put 2-4 words together. Requiring calm environment and simple direct cues. Pt with poor awareness and safety. Very motivated to participate. Pt very excited to have gone to bathroom with therapist.      Exercises      General Comments General comments (skin integrity, edema, etc.): HR 110-120s with activity. Pt's granddaughter present throughout.      Pertinent Vitals/Pain Pain Assessment: Faces Faces Pain Scale: No hurt Pain Descriptors / Indicators: Discomfort;Guarding Pain Intervention(s): Monitored during session    Home Living     Available Help at Discharge: Family;Available 24 hours/day (daughter and granddaughter to provide care)   Home Access: Stairs to enter Entrance Stairs-Rails: None Home Layout: One level   Additional Comments: verified with granddaughter    Prior Function            PT Goals (current goals can now be found in the care plan section) Acute Rehab PT Goals Patient Stated Goal: none stated PT Goal Formulation: With family Time For Goal Achievement: 04/10/21 Potential to Achieve Goals: Good Progress towards PT goals: Progressing toward goals    Frequency    Min 4X/week      PT Plan Current plan remains appropriate    Co-evaluation PT/OT/SLP Co-Evaluation/Treatment: Yes Reason for Co-Treatment: For patient/therapist safety;To address functional/ADL transfers PT goals addressed during session: Mobility/safety with mobility;Balance        AM-PAC PT "6 Clicks" Mobility   Outcome Measure  Help needed turning from your back to your side while in a flat bed without using bedrails?: A Little Help needed moving from lying on your back to sitting on the side of a flat bed without using bedrails?: Total Help needed moving to and from a bed to a chair (including a wheelchair)?: Total Help needed standing up from a chair using your arms (e.g., wheelchair or bedside chair)?: Total Help  needed to walk in hospital room?: Total Help needed climbing 3-5 steps with a railing? : Total 6 Click Score: 8    End of Session Equipment Utilized During Treatment: Gait belt Activity Tolerance: Patient tolerated treatment well Patient left: in bed;with call bell/phone within reach;with bed alarm set;with restraints reapplied Nurse Communication: Mobility status PT Visit Diagnosis: Other abnormalities of gait and mobility (R26.89);Hemiplegia and hemiparesis Hemiplegia - Right/Left: Right Hemiplegia - dominant/non-dominant: Dominant Hemiplegia - caused by: Cerebral infarction     Time: 6606-3016 PT Time Calculation (min) (ACUTE ONLY): 39 min  Charges:  $Gait Training: 8-22 mins                     Denson Niccoli A. Gilford Rile PT, DPT Acute Rehabilitation Services Pager [phone] Office [phone]    Linna Hoff 03/30/2021, 4:42 PM

## 2021-03-30 NOTE — Progress Notes (Signed)
Occupational Therapy Treatment Patient Details Name: Danielle Payne MRN: [id number] DOB: 07-Nov-1945 Today's Date: 03/30/2021    History of present illness 75 yo female presenting 6/16 with acute onset dense receptive and expressive aphasia. tPA given on arrival (6/16) Imaging revealed L MCA infarcts with hypodensity in L occipital, frontal, and parietal lobe. Repeat CT on 6/17 with hemorrhagic conversion in L temporal and parietal lobes. PMH includes: Asthma, Afib, CHF, CAD, HTN, and diverticulitis.   OT comments  Pt progressing towards established OT goals. Pt continues to present with aphasia, decreased functional use of RUE, cognition deficits, poor balance, and decreased safety awareness with impulsivity. Pt highly motivated to participate in therapy. Pt performing functional mobility to bathroom with Mod-Max A +2. Pt completing toileting and hand hygiene at sink with Mod-Max A +2 and Mod-Max cues. Pt very pleased at end of session. Continue to recommend dc to post-acute rehab and feel pt would highly benefit from intensive therapy. Will continue to follow acutely as admitted.   Follow Up Recommendations  CIR    Equipment Recommendations  3 in 1 bedside commode    Recommendations for Other Services      Precautions / Restrictions Precautions Precautions: Fall;Other (comment) Precaution Comments: R hemiplegia, global aphasia       Mobility Bed Mobility Overal bed mobility: Needs Assistance Bed Mobility: Supine to Sit;Sit to Supine     Supine to sit: Mod assist;+2 for physical assistance;+2 for safety/equipment;HOB elevated Sit to supine: Min guard   General bed mobility comments: MOd A +2 to intiating bed mobility to sit at EOB. Min Guard A for safety with returning to bed    Transfers Overall transfer level: Needs assistance Equipment used: 2 person hand held assist Transfers: Sit to/from [facility] Sit to Stand: Min assist;+2 physical assistance;Mod  assist         General transfer comment: MinA + 2 to rise from edge of bed. Mod A for safe descent to toilet and MIn A for power up into standing from toilet    Balance Overall balance assessment: Needs assistance Sitting-balance support: Feet supported;No upper extremity supported Sitting balance-Leahy Scale: Fair     Standing balance support: During functional activity;Bilateral upper extremity supported Standing balance-Leahy Scale: Poor Standing balance comment: Reliant on Mod-Max A for standing balance                           ADL either performed or assessed with clinical judgement   ADL Overall ADL's : Needs assistance/impaired     Grooming: Wash/dry hands;Moderate assistance;Standing Grooming Details (indicate cue type and reason): MIn-Mod A for standing balance. Mod A for hand over hand in sequencing hand hygiene. Poor following of cues and requiring simple, direct verbal cues             Lower Body Dressing: Moderate assistance;+2 for physical assistance;Sit to/from stand;Bed level;Minimal assistance Lower Body Dressing Details (indicate cue type and reason): Pt donning socks at bed level Toilet Transfer: Moderate assistance;Ambulation;Regular Glass blower/designer Details (indicate cue type and reason): Mod A for safe descent to toilet. Toileting- Clothing Manipulation and Hygiene: Moderate assistance;Sitting/lateral lean;+2 for safety/equipment Toileting - Clothing Manipulation Details (indicate cue type and reason): Pt performing peri care while seated; but poor wareness to completing taskl. Mod A for cleaning up after toileting.     Functional mobility during ADLs: Maximal assistance;+2 for physical assistance;+2 for safety/equipment;Cueing for safety;Cueing for sequencing General ADL Comments: Pt performing mobility  to.from bathroom with Max A +2 and completing grooming at sink. Requiring Mod-Max A and cues throughout.     Vision   Vision  Assessment?: Vision impaired- to be further tested in functional context   Perception     Praxis      Cognition Arousal/Alertness: Awake/alert Behavior During Therapy: WFL for tasks assessed/performed Overall Cognitive Status: Difficult to assess Area of Impairment: Following commands;Problem solving;Awareness;Safety/judgement;Memory;Attention                   Current Attention Level: Focused;Sustained Memory: Decreased short-term memory Following Commands: Follows one step commands with increased time;Follows one step commands inconsistently Safety/Judgement: Decreased awareness of safety;Decreased awareness of deficits Awareness: Intellectual Problem Solving: Slow processing;Requires verbal cues;Difficulty sequencing General Comments: Pt with expressive (and possibly receptive) aphasia; able to put 2-4 words together. Requiring calm environment and simple direct cues. Pt with poor awareness and safety. Very motivated to participate. Pt very excited to have gone to bathroom with therapist.        Exercises     Shoulder Instructions       General Comments HR 110-120s with activity. Pt's granddaughter present throughout.    Pertinent Vitals/ Pain       Pain Assessment: Faces Faces Pain Scale: No hurt Pain Descriptors / Indicators: Discomfort;Guarding Pain Intervention(s): Monitored during session;Repositioned  Home Living                                          Prior Functioning/Environment              Frequency  Min 2X/week        Progress Toward Goals  OT Goals(current goals can now be found in the care plan section)  Progress towards OT goals: Progressing toward goals  Acute Rehab OT Goals Patient Stated Goal: none stated OT Goal Formulation: Patient unable to participate in goal setting Time For Goal Achievement: 04/10/21 Potential to Achieve Goals: Good ADL Goals Pt Will Perform Grooming: with min assist;sitting Pt Will  Transfer to Toilet: with max assist;stand pivot transfer;bedside commode Pt/caregiver will Perform Home Exercise Program: Increased strength;Both right and left upper extremity;With minimal assist;With written HEP provided Additional ADL Goal #1: Pt will identify 3 ADL items and use items for intreded purpose with 100% accuracy in 3/5 trials. Additional ADL Goal #2: Pt will increase to following 1 step commands 50% of the time in 3/5 trials.  Plan Discharge plan remains appropriate    Co-evaluation    PT/OT/SLP Co-Evaluation/Treatment: Yes Reason for Co-Treatment: For patient/therapist safety;To address functional/ADL transfers   OT goals addressed during session: ADL's and self-care      AM-PAC OT "6 Clicks" Daily Activity     Outcome Measure   Help from another person eating meals?: Total Help from another person taking care of personal grooming?: A Lot Help from another person toileting, which includes using toliet, bedpan, or urinal?: A Lot Help from another person bathing (including washing, rinsing, drying)?: Total Help from another person to put on and taking off regular upper body clothing?: A Lot Help from another person to put on and taking off regular lower body clothing?: Total 6 Click Score: 9    End of Session Equipment Utilized During Treatment: Gait belt  OT Visit Diagnosis: Unsteadiness on feet (R26.81);Muscle weakness (generalized) (M62.81);Other symptoms and signs involving cognitive function;Hemiplegia and hemiparesis   Activity Tolerance Patient  tolerated treatment well   Patient Left in bed;with call bell/phone within reach;with bed alarm set;Other (comment);with restraints reapplied (posey belt applied)   Nurse Communication Mobility status;Other (comment) (Told charge RN that pt becoming aggitated with Bil wrist restraints. agreeable to leave R restraint off and placing L and posey on. [place] in room)        Time: 1062-6948 OT Time Calculation  (min): 39 min  Charges: OT General Charges $OT Visit: 1 Visit OT Treatments $Self Care/Home Management : 23-37 mins  [place], OTR/L Acute Rehab Pager: [phone] Office: [place] 03/30/2021, 12:01 PM

## 2021-03-30 NOTE — Progress Notes (Signed)
RT note. Patient currently on rm air not requiring bipap at this time. Sat 100% w/ stable VS, RT will continue to monitor.

## 2021-03-30 NOTE — Progress Notes (Signed)
PROGRESS NOTE    Danielle Payne  IOX:[id number] DOB: June 22, 1946 DOA: 03/23/2021 PCP: Glendale Chard, MD   Brief Narrative:  Pt initially in ICU with hemorrhagic conversion s/p TPA, Neuro is primary teama nd medicine consulted re glucose and BP   Assessment & Plan:   Active Problems:   Atrial fibrillation (HCC)   Stroke (cerebrum) (HCC)  HTN: Hydralazine added by primary team Will monitor bp and titrate  DM: Now off insulin gtt Primary team added glimepiride C/W SSI  CVA with hemorrhagic conversion Neuro primary No new recs from medicine  Atrial fin chronic with RVR Cards assisting C/w coreg 25bid Pt resistant o DOAC-primary team discussed with daughtewr who is in agreement  HTN: COREG, DILT HYDRALAIZNR AND LISINOPRIL As above Hydralazine recently added Titrate as needed  DM On levemir and SSI A1C 8.8  ASTHAM EXAC Stable currently C/W NEBS  HLD LDL 81, ABOVE goals Priamry evaluating for statin  AOCKD: Avoid renal toxic meds Monitor Function    DVT prophylaxis: SCD/Compression stockings  Code Status: FULL    Code Status Orders  (From admission, onward)           Start     Ordered   03/26/21 2315  Full code  Continuous        03/26/21 2314           Code Status History     Date Active Date Inactive Code Status Order ID Comments User Context   03/26/2021 1913 03/26/2021 2314 DNR [id number]  Lanier Clam, MD Inpatient   03/23/2021 2022 03/26/2021 1913 Full Code [id number]  Gwinda Maine, MD ED   04/02/2019 1204 04/05/2019 1533 Full Code [id number]  Darliss Cheney, MD ED   11/20/2018 1208 11/21/2018 1606 Full Code [id number]  Elodia Florence., MD Inpatient   11/23/2014 0620 11/23/2014 1822 Full Code [id number]  Rise Patience, MD Inpatient      Family Communication: Per primary Disposition Plan:   per primary Consults called:  CCM, HOSPITALIST Admission status: Inpatient   Consultants:  AS ABOVE  Procedures:  CT Head  Wo Contrast  Result Date: 03/28/2021 CLINICAL DATA:  Stroke, follow-up EXAM: CT HEAD WITHOUT CONTRAST TECHNIQUE: Contiguous axial images were obtained from the base of the skull through the vertex without intravenous contrast. COMPARISON:  03/26/2021 FINDINGS: Brain: Areas of evolving recent infarction with hemorrhagic conversion again identified in the left frontal, parietal, and lateral occipital lobes. Similar associated mass effect remains mild. Unchanged small left temporal, likely subarachnoid hemorrhage. No new hemorrhage or loss of gray-white differentiation. Ventricles are stable in size. Vascular: No new findings. Skull: Calvarium is unremarkable. Sinuses/Orbits: No acute finding. Other: None. 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 Unchanged small left temporal, likely subarachnoid hemorrhage. No new hemorrhage or worsening mass effect. Electronically Signed   By: Macy Mis M.D.   On: 03/28/2021 10:43   CT HEAD WO CONTRAST  Result Date: 03/26/2021 CLINICAL DATA:  Stroke follow-up EXAM: CT HEAD WITHOUT CONTRAST TECHNIQUE: Contiguous axial images were obtained from the base of the skull through the vertex without intravenous contrast. COMPARISON:  CT head 03/25/2021 FINDINGS: Brain: Acute infarct in the left middle frontal lobe unchanged with minimal hemorrhage, unchanged. Acute infarct left parietal lobe with hemorrhagic transformation unchanged from yesterday. Infarct also extends into the left occipital lobe hemorrhagic transformation unchanged. Small amount of hemorrhage in the left temporal lobe likely subarachnoid hemorrhage is unchanged. Slight midline shift to the right unchanged. Negative for hydrocephalus. No new  area of infarct. Vascular: Negative for hyperdense vessel Skull: Negative Sinuses/Orbits: Mucosal edema paranasal sinuses.  Negative orbit Other: None IMPRESSION: No change from yesterday. Acute infarct in the left MCA and left PCA territory  with hemorrhagic transformation in the left occipital and parietal lobes. Acute infarct left middle frontal gyrus with minimal hemorrhage, unchanged. No new area of hemorrhage or infarct. Electronically Signed   By: Franchot Gallo M.D.   On: 03/26/2021 18:47   CT HEAD WO CONTRAST  Result Date: 03/25/2021 CLINICAL DATA:  75 year old female code stroke presentation status post IV tPA. Left MCA territory infarct with mild hemorrhagic changes. EXAM: CT HEAD WITHOUT CONTRAST TECHNIQUE: Contiguous axial images were obtained from the base of the skull through the vertex without intravenous contrast. COMPARISON:  Head CT 03/24/2021 and earlier. FINDINGS: Brain: Mixed cytotoxic edema and rounded, nodular areas of hemorrhage redemonstrated in the left MCA territory. The several dominant foci of blood in the posterior left frontal and left parietal lobes continuing toward the left occipital pole are stable and size and configuration since yesterday (e.g. Series 4, image 22). Partial effacement of the left lateral ventricle and 2-3 mm of rightward midline shift are stable. No intraventricular hemorrhage. Probable gyral petechial hemorrhage at the left temporal operculum is stable on series 4, image 18. No definite extra-axial hemorrhage. Right hemisphere and posterior fossa gray-white matter differentiation is stable and within normal limits. No ventriculomegaly. Basilar cisterns remain patent. Vascular: Calcified atherosclerosis at the skull base. Skull: No acute osseous abnormality identified. Sinuses/Orbits: Visualized paranasal sinuses and mastoids are stable and well aerated. Other: Stable right nasoenteric tube. Mild leftward gaze is stable. Visualized scalp soft tissues are within normal limits. IMPRESSION: 1. Stable Left MCA infarct with hemorrhagic transformation since yesterday. Extension to the Left MCA/PCA watershed territory as before. Partially effaced left lateral ventricle and trace rightward midline shift  are stable. No extra-axial extension of blood. 2. No new areas of involvement or new intracranial abnormality. Electronically Signed   By: Genevie Ann M.D.   On: 03/25/2021 05:19   CT HEAD WO CONTRAST  Result Date: 03/24/2021 CLINICAL DATA:  Stroke.  24 hours post tPA EXAM: CT HEAD WITHOUT CONTRAST TECHNIQUE: Contiguous axial images were obtained from the base of the skull through the vertex without intravenous contrast. COMPARISON:  CT head 03/23/2021 FINDINGS: Brain: Interval development of left MCA infarct with hypodensity in the left frontal and parietal lobe. There is also involvement of the left occipital lobe. There is hemorrhagic transformation of the infarct involving the left temporal and parietal lobe with a moderate amount of blood present. Ventricle size normal.  No midline shift. Vascular: Negative for hyperdense vessel Skull: Negative Sinuses/Orbits: Mucosal edema paranasal sinuses. Feeding tube in place. Air-fluid level sphenoid sinus. Negative orbit. Other: None IMPRESSION: Interval development of acute infarct in the left MCA territory with hypodensity in the left temporal and parietal lobe. This also extends into the left occipital lobe. There has been interval hemorrhagic transformation of the infarct with moderate amount of blood present within the infarction. Dr. Cheral Marker  has been paged to discuss the results. Electronically Signed   By: Franchot Gallo M.D.   On: 03/24/2021 18:30   DG CHEST PORT 1 VIEW  Result Date: 03/28/2021 CLINICAL DATA:  Hypoxia.  Stroke. EXAM: PORTABLE CHEST 1 VIEW COMPARISON:  03/26/2021. 11/20/2018. FINDINGS: Feeding tube noted with tip below left hemidiaphragm. Cardiomegaly again noted. Left atrial appendage clip in stable position. Pulmonary venous congestion. Bilateral interstitial prominence again noted. Findings  suggest CHF. Low lung volumes. Small left pleural effusion may be present. No pneumothorax. Surgical clips left upper quadrant. IMPRESSION: 1.  Feeding  tube noted with tip below left hemidiaphragm. 2. Cardiomegaly. Left atrial appendage clip in stable position. Bilateral interstitial prominence. Findings suggest congestive heart failure with interstitial edema. Small left pleural effusion may be present. Electronically Signed   By: Marcello Moores  Register   On: 03/28/2021 11:13   DG CHEST PORT 1 VIEW  Result Date: 03/26/2021 CLINICAL DATA:  Respiratory distress. EXAM: PORTABLE CHEST 1 VIEW COMPARISON:  Chest radiograph 03/24/2021. FINDINGS: Enteric tube courses inferior to the diaphragm. Stable cardiomegaly status post median sternotomy. Bilateral interstitial pulmonary opacities. No pleural effusion or pneumothorax. Thoracic spine degenerative changes. IMPRESSION: Cardiomegaly with mild interstitial opacities suggestive of edema. Electronically Signed   By: Lovey Newcomer M.D.   On: 03/26/2021 16:56   DG CHEST PORT 1 VIEW  Result Date: 03/24/2021 CLINICAL DATA:  Fevers EXAM: PORTABLE CHEST 1 VIEW COMPARISON:  11/20/2018 FINDINGS: Cardiac shadow is enlarged. Postsurgical changes are again seen. Feeding catheter is noted extending into the stomach. Lungs are hypoinflated. Mild vascular congestion is noted without parenchymal edema. Left basilar opacity is noted which may represent early infiltrate. IMPRESSION: Mild CHF without pulmonary edema. Left basilar opacity which may represent an early infiltrate. Electronically Signed   By: Inez Catalina M.D.   On: 03/24/2021 17:54   DG Abd Portable 1V  Result Date: 03/24/2021 CLINICAL DATA:  Feeding tube placement EXAM: PORTABLE ABDOMEN - 1 VIEW COMPARISON:  Portable exam 1314 hours without priors for comparison FINDINGS: Tip of feeding tube projects over distal gastric antrum. Visualized bowel gas pattern normal. Atherosclerotic calcifications aorta and splenic artery. Prior median sternotomy and LEFT atrial appendage clipping. Subsegmental atelectasis at LEFT lung base. IMPRESSION: Tip of feeding tube projects over  distal gastric antrum. Electronically Signed   By: Lavonia Dana M.D.   On: 03/24/2021 13:40   DG Swallowing Func-Speech Pathology  Result Date: 03/29/2021 Formatting of this result is different from the original. Objective Swallowing Evaluation: Type of Study: MBS-Modified Barium Swallow Study  Patient Details Name: Missey Hasley MRN: [id number] Date of Birth: Feb 14, 1946 Today's Date: 03/29/2021 Time: SLP Start Time (ACUTE ONLY): 29 -SLP Stop Time (ACUTE ONLY): 8242 SLP Time Calculation (min) (ACUTE ONLY): 16 min Past Medical History: Past Medical History: Diagnosis Date  Allergy   Asthma   Atrial fibrillation ([place])   Chronic combined systolic and diastolic heart failure ([place]) 05/14/2018  Coronary artery disease   Diverticulitis   Diverticulitis   Hypertension   Pure hypercholesterolemia 05/14/2018  Stented coronary artery  Past Surgical History: Past Surgical History: Procedure Laterality Date  ABDOMINAL HYSTERECTOMY    CARDIAC CATHETERIZATION    CARDIOVERSION N/A 12/21/2014  Procedure: CARDIOVERSION;  Surgeon: Adrian Prows, MD;  Location: [facility];  Service: Cardiovascular;  Laterality: N/A;  CORONARY ANGIOPLASTY    CORONARY ARTERY BYPASS GRAFT   HPI: Larua Collier is an 75 y.o. female adm with aphasia - receptive and expressive - Found to have Left MCA CVA and is s/p tPa.   PMH + for atral fibrillation, asthma, chronic combined systolic and diastolic HF, CAD, HTN, hypercholesterolemia and CAD s/p angioplasty and CABG who presents with acute onset of dense receptive and expressive aphasia.  CT showed  Interval development of acute infarct in the left MCA territory with hypodensity in the left temporal and parietal lobe. This also extends into the left occipital lobe.  CXR 6/17 concerning for edema/Mild CHF, Left basilar  opacity which may represent an early infiltrate. Pt has not passed  [facility] swallow screen and has not been ready for po.  Has Cortrak in place at this time.  Has been lethargic with some increased  Respiratory demands and per RN, CCM has been consulted.  No data recorded Assessment / Plan / Recommendation CHL IP CLINICAL IMPRESSIONS 03/29/2021 Clinical Impression Pt's oropharyngeal dysphagia is marked by incoordinated oral to pharyngeal transit and poorly timed laryngeal closure leading to mild aspiration. Xerostomia impacted oral transit with cracker and tongue base residue with thicker textures. Using a straw with nectar pt consumed larger sip which entered laryngeal vestibule pre closure, aspirated during swallow (trace) then pt immediately exhaled post swallow blowing bubbles into cup.  Delayed cough was present. Thin penetrated prior to swallow and appeared to spontaneously clear. Most consistencies reached valleculae or pyriform sinuses for >3-5 seconds before swallow onset. Coordination of swallows and respirations was decreased and apniec period perhaps shortened. She is impulsive and thin liquids not recommended at this time. Nectar thick via TEASPOON, puree, pills crushed, full supervision and controlled environment with meals. SLP Visit Diagnosis Dysphagia, oropharyngeal phase (R13.12) Attention and concentration deficit following -- Frontal lobe and executive function deficit following -- Impact on safety and function Moderate aspiration risk   CHL IP TREATMENT RECOMMENDATION 03/29/2021 Treatment Recommendations Therapy as outlined in treatment plan below   Prognosis 03/29/2021 Prognosis for Safe Diet Advancement Good Barriers to Reach Goals -- Barriers/Prognosis Comment -- CHL IP DIET RECOMMENDATION 03/29/2021 SLP Diet Recommendations Dysphagia 1 (Puree) solids;Nectar thick liquid Liquid Administration via Spoon Medication Administration Crushed with puree Compensations Slow rate;Small sips/bites;Clear throat intermittently Postural Changes Seated upright at 90 degrees   CHL IP OTHER RECOMMENDATIONS 03/29/2021 Recommended Consults -- Oral Care Recommendations Oral care BID Other Recommendations --    CHL IP FOLLOW UP RECOMMENDATIONS 03/29/2021 Follow up Recommendations Inpatient Rehab   CHL IP FREQUENCY AND DURATION 03/29/2021 Speech Therapy Frequency (ACUTE ONLY) min 2x/week Treatment Duration 2 weeks      CHL IP ORAL PHASE 03/29/2021 Oral Phase Impaired Oral - Pudding Teaspoon -- Oral - Pudding Cup -- Oral - Honey Teaspoon -- Oral - Honey Cup Reduced posterior propulsion Oral - Nectar Teaspoon -- Oral - Nectar Cup Reduced posterior propulsion;Lingual/palatal residue Oral - Nectar Straw Reduced posterior propulsion;Lingual/palatal residue Oral - Thin Teaspoon -- Oral - Thin Cup Right anterior bolus loss Oral - Thin Straw -- Oral - Puree Reduced posterior propulsion;Lingual/palatal residue Oral - Mech Soft -- Oral - Regular Reduced posterior propulsion;Other (Comment) Oral - Multi-Consistency -- Oral - Pill -- Oral Phase - Comment --  CHL IP PHARYNGEAL PHASE 03/29/2021 Pharyngeal Phase Impaired Pharyngeal- Pudding Teaspoon -- Pharyngeal -- Pharyngeal- Pudding Cup -- Pharyngeal -- Pharyngeal- Honey Teaspoon -- Pharyngeal -- Pharyngeal- Honey Cup WFL Pharyngeal -- Pharyngeal- Nectar Teaspoon -- Pharyngeal -- Pharyngeal- Nectar Cup Delayed swallow initiation-pyriform sinuses;Pharyngeal residue - valleculae;Inter-arytenoid space residue Pharyngeal -- Pharyngeal- Nectar Straw Penetration/Aspiration before swallow;Delayed swallow initiation-pyriform sinuses Pharyngeal Material enters airway, passes BELOW cords and not ejected out despite cough attempt by patient Pharyngeal- Thin Teaspoon -- Pharyngeal -- Pharyngeal- Thin Cup Penetration/Aspiration before swallow Pharyngeal Material enters airway, remains ABOVE vocal cords then ejected out Pharyngeal- Thin Straw -- Pharyngeal -- Pharyngeal- Puree Delayed swallow initiation-vallecula Pharyngeal -- Pharyngeal- Mechanical Soft -- Pharyngeal -- Pharyngeal- Regular Pharyngeal residue - valleculae Pharyngeal -- Pharyngeal- Multi-consistency -- Pharyngeal -- Pharyngeal- Pill --  Pharyngeal -- Pharyngeal Comment --  CHL IP CERVICAL ESOPHAGEAL PHASE 03/29/2021 Cervical Esophageal Phase (No  Data) Pudding Teaspoon -- Pudding Cup -- Honey Teaspoon -- Honey Cup -- Nectar Teaspoon -- Nectar Cup -- Nectar Straw -- Thin Teaspoon -- Thin Cup -- Thin Straw -- Puree -- Mechanical Soft -- Regular -- Multi-consistency -- Pill -- Cervical Esophageal Comment -- [name] 03/29/2021, 2:53 PM              EEG adult  Result Date: 03/26/2021 Lora Havens, MD     03/26/2021  9:04 PM Patient Name: Quincey Nored MRN: [id number] Epilepsy Attending: Lora Havens Referring Physician/Provider: Dr Amie [place] Date: 03/26/2021 Duration: 24.33 mins Patient history: 75yo F with L MCA stroke, continues to be altered. EEG to evaluate for status epilepticus. Level of alertness: lethargic AEDs during EEG study: None Technical aspects: This EEG was obtained using a 10 lead EEG system positioned circumferentially without any parasagittal coverage (rapid EEG). Computer selected EEG is reviewed as well as background features and all clinically significant events.  Description: EEG showed continuous generalized and lateralized left hemisphere polymorphic 3 to 6 Hz theta-delta slowing.  Hyperventilation and photic stimulation were not performed.   ABNORMALITY - Continuous slow, generalized and lateralized left hemisphere IMPRESSION: This study is  suggestive of cortical dysfunction arising from left hemisphere, likely secondary to underlying stroke as well as severe diffuse encephalopathy, nonspecific etiology. No seizures or epileptiform discharges were seen throughout the recording. Dr Rory Percy was notified. Lora Havens   ECHOCARDIOGRAM COMPLETE  Result Date: 03/24/2021    ECHOCARDIOGRAM REPORT   Patient Name:   ANGELICE PIECH Date of Exam: 03/24/2021 Medical Rec #:  [id number]       Height:       63.0 in Accession #:    [id number]      Weight:       203.7 lb Date of Birth:  1946-04-16      BSA:           1.949 m Patient Age:    84 years        BP:           169/83 mmHg Patient Gender: F               HR:           109 bpm. Exam Location:  Inpatient Procedure: 2D Echo Indications:    stroke  History:        Patient has prior history of Echocardiogram examinations, most                 recent 05/26/2018. CAD, Arrythmias:Atrial Fibrillation; Risk                 Factors:Diabetes and Hypertension.  Sonographer:    Johny Chess Referring Phys: [id number] HUNTER J COLLINS  Sonographer Comments: Suboptimal apical window. Image acquisition challenging due to uncooperative patient, Image acquisition challenging due to patient body habitus and restraints. kept moving. IMPRESSIONS  1. Left ventricular ejection fraction, by estimation, is 60 to 65%. The left ventricle has normal function. The left ventricle has no regional wall motion abnormalities. Left ventricular diastolic function could not be evaluated.  2. Right ventricular systolic function is normal. The right ventricular size is normal.  3. The mitral valve is grossly normal. No evidence of mitral valve regurgitation. No evidence of mitral stenosis.  4. The aortic valve is grossly normal. Aortic valve regurgitation is mild. No aortic stenosis is present. FINDINGS  Left Ventricle: Left ventricular ejection fraction, by estimation, is 60 to 65%. The  left ventricle has normal function. The left ventricle has no regional wall motion abnormalities. The left ventricular internal cavity size was normal in size. There is  borderline left ventricular hypertrophy. Left ventricular diastolic function could not be evaluated due to atrial fibrillation. Left ventricular diastolic function could not be evaluated. Right Ventricle: The right ventricular size is normal. Right vetricular wall thickness was not well visualized. Right ventricular systolic function is normal. Left Atrium: Left atrial size was normal in size. Right Atrium: Right atrial size was not well visualized.  Pericardium: There is no evidence of pericardial effusion. Mitral Valve: The mitral valve is grossly normal. No evidence of mitral valve regurgitation. No evidence of mitral valve stenosis. Tricuspid Valve: The tricuspid valve is grossly normal. Tricuspid valve regurgitation is not demonstrated. Aortic Valve: The aortic valve is grossly normal. Aortic valve regurgitation is mild. No aortic stenosis is present. Pulmonic Valve: The pulmonic valve was grossly normal. Pulmonic valve regurgitation is not visualized. Aorta: The aortic root and ascending aorta are structurally normal, with no evidence of dilitation. IAS/Shunts: The atrial septum is grossly normal.  LEFT VENTRICLE PLAX 2D LVIDd:         4.60 cm LVIDs:         3.40 cm LV PW:         1.10 cm LV IVS:        1.30 cm LVOT diam:     1.90 cm LVOT Area:     2.84 cm  IVC IVC diam: 2.20 cm LEFT ATRIUM           Index LA diam:      4.40 cm 2.26 cm/m LA Vol (A4C): 60.2 ml 30.89 ml/m   AORTA Ao Root diam: 2.80 cm Ao Asc diam:  3.20 cm  SHUNTS Systemic Diam: 1.90 cm Mertie Moores MD Electronically signed by Mertie Moores MD Signature Date/Time: 03/24/2021/5:19:01 PM    Final    CT HEAD CODE STROKE WO CONTRAST  Result Date: 03/23/2021 CLINICAL DATA:  Code stroke.  Acute neuro deficit.  Aphasia EXAM: CT HEAD WITHOUT CONTRAST TECHNIQUE: Contiguous axial images were obtained from the base of the skull through the vertex without intravenous contrast. COMPARISON:  None. FINDINGS: Brain: Mild atrophy and mild white matter hypodensity bilaterally. Negative for acute infarct, hemorrhage, mass. Vascular: Negative for hyperdense vessel Skull: Negative Sinuses/Orbits: Mild mucosal edema paranasal sinuses. Negative orbit Other: None ASPECTS ([place] Stroke Program Early CT Score) - Ganglionic level infarction (caudate, lentiform nuclei, internal capsule, insula, M1-M3 cortex): 7 - Supraganglionic infarction (M4-M6 cortex): 3 Total score (0-10 with 10 being normal): 10  IMPRESSION: 1. No acute abnormality. 2. ASPECTS is 10 3. Code stroke imaging results were communicated on 03/23/2021 at 6:59 pm to provider [place] via text page Electronically Signed   By: Franchot Gallo M.D.   On: 03/23/2021 19:00   CT ANGIO HEAD CODE STROKE  Result Date: 03/23/2021 CLINICAL DATA:  Expressive aphasia EXAM: CT ANGIOGRAPHY HEAD AND NECK TECHNIQUE: Multidetector CT imaging of the head and neck was performed using the standard protocol during bolus administration of intravenous contrast. Multiplanar CT image reconstructions and MIPs were obtained to evaluate the vascular anatomy. Carotid stenosis measurements (when applicable) are obtained utilizing NASCET criteria, using the distal internal carotid diameter as the denominator. CONTRAST:  30mL OMNIPAQUE IOHEXOL 350 MG/ML SOLN COMPARISON:  None. FINDINGS: CTA NECK FINDINGS SKELETON: There is no bony spinal canal stenosis. No lytic or blastic lesion. OTHER NECK: Normal pharynx, larynx and major salivary glands. No  cervical lymphadenopathy. Unremarkable thyroid gland. UPPER CHEST: No pneumothorax or pleural effusion. No nodules or masses. AORTIC ARCH: There is calcific atherosclerosis of the aortic arch. There is no aneurysm, dissection or hemodynamically significant stenosis of the visualized portion of the aorta. Conventional 3 vessel aortic branching pattern. The visualized proximal subclavian arteries are widely patent. RIGHT CAROTID SYSTEM: No dissection, occlusion or aneurysm. Mild atherosclerotic calcification at the carotid bifurcation without hemodynamically significant stenosis. LEFT CAROTID SYSTEM: No dissection, occlusion or aneurysm. Mild atherosclerotic calcification at the carotid bifurcation without hemodynamically significant stenosis. VERTEBRAL ARTERIES: Left dominant configuration. Both origins are clearly patent. There is no dissection, occlusion or flow-limiting stenosis to the skull base (V1-V3 segments). CTA HEAD FINDINGS  POSTERIOR CIRCULATION: --Vertebral arteries: Normal V4 segments. --Inferior cerebellar arteries: Normal. --Basilar artery: Normal. --Superior cerebellar arteries: Normal. --Posterior cerebral arteries (PCA): Normal. ANTERIOR CIRCULATION: --Intracranial internal carotid arteries: Normal. --Anterior cerebral arteries (ACA): Normal. Both A1 segments are present. Patent anterior communicating artery (a-comm). --Middle cerebral arteries (MCA): Normal. VENOUS SINUSES: As permitted by contrast timing, patent. ANATOMIC VARIANTS: None Review of the MIP images confirms the above findings. IMPRESSION: 1. No emergent large vessel occlusion or high-grade stenosis of the intracranial arteries. 2. Mild bilateral carotid bifurcation atherosclerosis without hemodynamically significant stenosis by NASCET criteria. Aortic Atherosclerosis (ICD10-I70.0). Electronically Signed   By: Ulyses Jarred M.D.   On: 03/23/2021 19:38   CT ANGIO NECK CODE STROKE  Result Date: 03/23/2021 : CLINICAL DATA:   Expressive aphasia EXAM: CT ANGIOGRAPHY HEAD AND NECK TECHNIQUE: Multidetector CT imaging of the head and neck was performed using the standard protocol during bolus administration of intravenous contrast. Multiplanar CT image reconstructions and MIPs were obtained to evaluate the vascular anatomy. Carotid stenosis measurements (when applicable) are obtained utilizing NASCET criteria, using the distal internal carotid diameter as the denominator. CONTRAST:   57mL OMNIPAQUE IOHEXOL 350 MG/ML SOLN COMPARISON:   None. FINDINGS: CTA NECK FINDINGS SKELETON: There is no bony spinal canal stenosis. No lytic or blastic lesion. OTHER NECK: Normal pharynx, larynx and major salivary glands. No cervical lymphadenopathy. Unremarkable thyroid gland. UPPER CHEST: No pneumothorax or pleural effusion. No nodules or masses. AORTIC ARCH: There is calcific atherosclerosis of the aortic arch. There is no aneurysm, dissection or hemodynamically significant stenosis  of the visualized portion of the aorta. Conventional 3 vessel aortic branching pattern. The visualized proximal subclavian arteries are widely patent. RIGHT CAROTID SYSTEM: No dissection, occlusion or aneurysm. Mild atherosclerotic calcification at the carotid bifurcation without hemodynamically significant stenosis. LEFT CAROTID SYSTEM: No dissection, occlusion or aneurysm. Mild atherosclerotic calcification at the carotid bifurcation without hemodynamically significant stenosis. VERTEBRAL ARTERIES: Left dominant configuration. Both origins are clearly patent. There is no dissection, occlusion or flow-limiting stenosis to the skull base (V1-V3 segments). CTA HEAD FINDINGS POSTERIOR CIRCULATION: --Vertebral arteries: Normal V4 segments. --Inferior cerebellar arteries: Normal. --Basilar artery: Normal. --Superior cerebellar arteries: Normal. --Posterior cerebral arteries (PCA): Normal. ANTERIOR CIRCULATION: --Intracranial internal carotid arteries: Normal. --Anterior cerebral arteries (ACA): Normal. Both A1 segments are present. Patent anterior communicating artery (a-comm). --Middle cerebral arteries (MCA): Normal. VENOUS SINUSES: As permitted by contrast timing, patent. ANATOMIC VARIANTS: None Review of the MIP images confirms the above findings. IMPRESSION: 1. No emergent large vessel occlusion or high-grade stenosis of the intracranial arteries. 2. Mild bilateral carotid bifurcation atherosclerosis without hemodynamically significant stenosis by NASCET criteria. Aortic Atherosclerosis (ICD10-I70.0). Electronically Signed   By: Ulyses Jarred M.D.   On: 03/23/2021 19:45       Subjective: No acute changes ovenright  Objective: Vitals:   03/30/21 0739 03/30/21 0839 03/30/21 1133 03/30/21 1200  BP: (!) 179/84  (!) 171/93   Pulse: 92  98 76  Resp: 19  20   Temp: 98.6 F (37 C)  (!) 97.5 F (36.4 C)   TempSrc: Axillary  Oral   SpO2: 100% 100% 100%   Weight:      Height:        Intake/Output Summary  (Last 24 hours) at 03/30/2021 1312 Last data filed at 03/30/2021 0830 Gross per 24 hour  Intake 1438.85 ml  Output 1300 ml  Net 138.85 ml   Filed Weights   03/25/21 0400 03/28/21 0500 03/29/21 0405  Weight: 90 kg 90 kg 91 kg    Examination:  General exam: Appears calm Respiratory system: Clear to auscultation. Respiratory effort normal. Cardiovascular system: S1 & S2 heard, RRR. No JVD, murmurs, rubs, gallops or clicks. No pedal edema. Gastrointestinal system: Abdomen is nondistended, soft and nontender. No organomegaly or masses felt. Normal bowel sounds heard. Central nervous system: asmyetrical wekaness c/w cva, decreased sensation Extremities: wwp, nv intact Skin: No rashes, lesions or ulcers Psychiatry: Judgement and insight impaired Mood & affect flat    Data Reviewed: I have personally reviewed following labs and imaging studies  CBC: Recent Labs  Lab 03/23/21 1847 03/23/21 1858 03/25/21 0610 03/26/21 0300 03/26/21 1849 03/27/21 0424 03/28/21 0240 03/29/21 0410  WBC 8.8  --  17.0* 13.1*  --  14.4* 20.3* 19.0*  NEUTROABS 4.8  --   --   --   --   --   --   --   HGB 15.1*   < > 13.8 12.6 11.6* 12.2 12.1 12.5  HCT 46.0   < > 41.0 37.9 34.0* 38.0 37.2 39.1  MCV 92.0  --  88.0 90.7  --  93.1 93.0 93.5  PLT 233  --  238 181  --  218 245 238   < > = values in this interval not displayed.   Basic Metabolic Panel: Recent Labs  Lab 03/24/21 1650 03/25/21 0610 03/25/21 0921 03/25/21 1627 03/25/21 2022 03/26/21 0300 03/26/21 0907 03/26/21 1849 03/26/21 2041 03/27/21 0424 03/27/21 1238 03/27/21 2031 03/28/21 0240 03/28/21 0921 03/29/21 0410 03/30/21 0242  NA  --  135   < > 137   < > 148*  148*   < > 158*   < > 160*   < > 165* 163* 165* 164* 156*  K  --  3.4*  --   --   --  3.7  --  3.9  --  3.6  --   --  3.4*  --  3.5 3.2*  CL  --  101  --   --   --  120*  --   --   --  129*  --   --  >130*  --  >130* 123*  CO2  --  23  --   --   --  22  --   --   --  20*   --   --  21*  --  22 25  GLUCOSE  --  256*  --   --   --  204*  --   --   --  365*  --   --  188*  --  156* 236*  BUN  --  34*  --   --   --  38*  --   --   --  50*  --   --  56*  --  54* 42*  CREATININE  --  1.28*  --   --   --  1.02*  --   --   --  1.45*  --   --  1.22*  --  0.98 0.90  CALCIUM  --  9.1  --   --   --  8.4*  --   --   --  8.9  --   --  9.3  --  9.2 8.9  MG 1.8 2.1  --  2.1  --  2.2  --   --   --   --   --   --   --   --   --   --   PHOS 2.5 3.0  --  3.5  --  3.2  --   --   --   --   --   --   --   --   --   --    < > = values in this interval not displayed.   GFR: Estimated Creatinine Clearance: 56.9 mL/min (by C-G formula based on SCr of 0.9 mg/dL). Liver Function Tests: Recent Labs  Lab 03/23/21 1847  AST 24  ALT 23  ALKPHOS 88  BILITOT 0.6  PROT 7.4  ALBUMIN 3.8   No results for input(s): LIPASE, AMYLASE in the last 168 hours. No results for input(s): AMMONIA in the last 168 hours. Coagulation Profile: Recent Labs  Lab 03/23/21 1847  INR 1.1   Cardiac Enzymes: No results for input(s): CKTOTAL, CKMB, CKMBINDEX, TROPONINI in the last 168 hours. BNP (last 3 results) No results for input(s): PROBNP in the last 8760 hours. HbA1C: No results for input(s): HGBA1C in the last 72 hours. CBG: Recent Labs  Lab 03/29/21 1939 03/29/21 2356 03/30/21 0408 03/30/21 0758 03/30/21 1132  GLUCAP 156* 176* 191* 193* 345*   Lipid Profile: No results for input(s): CHOL, HDL, LDLCALC, TRIG, CHOLHDL, LDLDIRECT in the last 72 hours. Thyroid Function Tests: No results for input(s): TSH, T4TOTAL, FREET4, T3FREE, THYROIDAB in the last 72 hours. Anemia Panel: No results for input(s): VITAMINB12, FOLATE, FERRITIN, TIBC, IRON, RETICCTPCT in the last 72 hours. Sepsis Labs: No results for input(s): PROCALCITON, LATICACIDVEN in the last 168 hours.  Recent Results (from the past 240 hour(s))  Resp Panel by RT-PCR (Flu A&B, Covid) Nasopharyngeal Swab     Status: None    Collection Time: 03/23/21  8:20 PM   Specimen: Nasopharyngeal Swab; Nasopharyngeal(NP) swabs in vial transport medium  Result Value Ref Range Status   SARS Coronavirus 2 by RT PCR NEGATIVE NEGATIVE Final    Comment: (NOTE) SARS-CoV-2 target nucleic acids are NOT DETECTED.  The SARS-CoV-2 RNA is generally detectable in upper respiratory specimens during the acute phase of infection. The lowest concentration of SARS-CoV-2 viral copies this assay can detect is 138 copies/mL. A negative result does not preclude SARS-Cov-2 infection and should not be used as the sole basis for treatment or other patient management decisions. A negative result may occur with  improper specimen collection/handling, submission of specimen other than nasopharyngeal swab, presence of viral mutation(s) within the areas targeted by this assay, and inadequate number of viral copies(<138 copies/mL). A negative result must be combined with clinical observations, patient history, and epidemiological information. The expected result is Negative.  Fact Sheet for Patients:  EntrepreneurPulse.com.au  Fact Sheet for Healthcare Providers:  IncredibleEmployment.be  This test is no t yet approved or cleared by the [place] FDA and  has  been authorized for detection and/or diagnosis of SARS-CoV-2 by FDA under an Emergency Use Authorization (EUA). This EUA will remain  in effect (meaning this test can be used) for the duration of the COVID-19 declaration under Section 564(b)(1) of the Act, 21 U.S.C.section 360bbb-3(b)(1), unless the authorization is terminated  or revoked sooner.       Influenza A by PCR NEGATIVE NEGATIVE Final   Influenza B by PCR NEGATIVE NEGATIVE Final    Comment: (NOTE) The Xpert Xpress SARS-CoV-2/FLU/RSV plus assay is intended as an aid in the diagnosis of influenza from Nasopharyngeal swab specimens and should not be used as a sole basis for treatment.  Nasal washings and aspirates are unacceptable for Xpert Xpress SARS-CoV-2/FLU/RSV testing.  Fact Sheet for Patients: EntrepreneurPulse.com.au  Fact Sheet for Healthcare Providers: IncredibleEmployment.be  This test is not yet approved or cleared by the [place] FDA and has been authorized for detection and/or diagnosis of SARS-CoV-2 by FDA under an Emergency Use Authorization (EUA). This EUA will remain in effect (meaning this test can be used) for the duration of the COVID-19 declaration under Section 564(b)(1) of the Act, 21 U.S.C. section 360bbb-3(b)(1), unless the authorization is terminated or revoked.  Performed at [facility], [place] [address], [place], [place] [address]   MRSA PCR Screening     Status: None   Collection Time: 03/23/21  9:33 PM  Result Value Ref Range Status   MRSA by PCR NEGATIVE NEGATIVE Final    Comment:        The GeneXpert MRSA Assay (FDA approved for NASAL specimens only), is one component of a comprehensive MRSA colonization surveillance program. It is not intended to diagnose MRSA infection nor to guide or monitor treatment for MRSA infections. Performed at [facility], [place] [address], [place], [place] [address]   Culture, Urine     Status: None   Collection Time: 03/28/21  8:37 AM   Specimen: Urine, Random  Result Value Ref Range Status   Specimen Description URINE, RANDOM  Final   Special Requests NONE  Final   Culture   Final    NO GROWTH Performed at [facility], [place] [address], [place], [place] [address]    Report Status 03/29/2021 FINAL  Final  Culture, blood (routine x 2)     Status: None (Preliminary result)   Collection Time: 03/28/21  9:20 AM   Specimen: BLOOD RIGHT HAND  Result Value Ref Range Status   Specimen Description BLOOD RIGHT HAND  Final   Special Requests   Final    BOTTLES DRAWN AEROBIC AND ANAEROBIC Blood Culture results may not be optimal due to an  inadequate volume of blood received in culture bottles   Culture   Final    NO GROWTH 2 DAYS Performed at [facility], [place] [address], [place],  [address]    Report Status PENDING  Incomplete  Culture, blood (routine x 2)     Status: None (Preliminary result)   Collection Time: 03/28/21  9:22 AM   Specimen: BLOOD RIGHT ARM  Result Value Ref Range Status   Specimen Description BLOOD RIGHT ARM  Final   Special Requests   Final    BOTTLES DRAWN AEROBIC ONLY Blood Culture results may not be optimal due to an inadequate volume of blood received in culture bottles   Culture   Final    NO GROWTH 2 DAYS Performed at [facility], [place],  [place] [address]    Report Status PENDING  Incomplete         Radiology Studies: DG Swallowing Func-Speech Pathology  Result Date: 03/29/2021 Formatting of this result is different from the original. 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   PMH + for atral fibrillation, asthma, chronic combined systolic and diastolic HF, CAD, HTN, hypercholesterolemia and CAD s/p angioplasty and CABG who presents with acute onset of dense receptive and expressive aphasia.  CT showed  Interval development of acute infarct in the left MCA territory with hypodensity in the left temporal and parietal lobe. This also extends into the left occipital lobe.  CXR 6/17 concerning for edema/Mild CHF, Left basilar opacity which may represent an early infiltrate. Pt has not passed  [facility] swallow screen and has not been ready for po.  Has Cortrak in place at this time.  Has been lethargic with some increased Respiratory demands and per RN, CCM has been consulted.  No data recorded Assessment / Plan / Recommendation CHL IP CLINICAL IMPRESSIONS 03/29/2021 Clinical Impression Pt's oropharyngeal dysphagia is marked by incoordinated oral to pharyngeal transit and poorly timed laryngeal closure leading to mild aspiration. Xerostomia impacted oral transit with cracker and tongue base residue with thicker textures. Using a straw with nectar pt consumed larger sip which entered laryngeal vestibule pre closure, aspirated during swallow (trace) then pt immediately exhaled post swallow blowing bubbles into cup.  Delayed cough was present. Thin penetrated prior to swallow and appeared to spontaneously clear. Most consistencies reached valleculae or pyriform sinuses for >3-5 seconds before swallow onset. Coordination of swallows and respirations was decreased and apniec period perhaps shortened. She is impulsive and thin liquids not recommended at this time. Nectar thick via TEASPOON, puree, pills crushed, full supervision and controlled environment with meals. SLP Visit Diagnosis Dysphagia, oropharyngeal phase (R13.12) Attention and concentration deficit following -- Frontal lobe and executive function deficit following -- Impact on safety and function  Moderate aspiration risk   CHL IP TREATMENT RECOMMENDATION 03/29/2021 Treatment Recommendations Therapy as outlined in treatment plan below   Prognosis 03/29/2021 Prognosis for Safe Diet Advancement Good Barriers to Reach Goals -- Barriers/Prognosis Comment -- CHL IP DIET RECOMMENDATION 03/29/2021 SLP Diet Recommendations Dysphagia 1 (Puree) solids;Nectar thick liquid Liquid Administration via Spoon Medication Administration Crushed with puree Compensations Slow rate;Small sips/bites;Clear throat intermittently Postural Changes Seated upright at 90 degrees   CHL IP OTHER RECOMMENDATIONS 03/29/2021 Recommended Consults -- Oral Care Recommendations Oral care BID Other Recommendations --   CHL  IP FOLLOW UP RECOMMENDATIONS 03/29/2021 Follow up Recommendations Inpatient Rehab   CHL IP FREQUENCY AND DURATION 03/29/2021 Speech Therapy Frequency (ACUTE ONLY) min 2x/week Treatment Duration 2 weeks      CHL IP ORAL PHASE 03/29/2021 Oral Phase Impaired Oral - Pudding Teaspoon -- Oral - Pudding Cup -- Oral - Honey Teaspoon -- Oral - Honey Cup Reduced posterior propulsion Oral - Nectar Teaspoon -- Oral - Nectar Cup Reduced posterior propulsion;Lingual/palatal residue Oral - Nectar Straw Reduced posterior propulsion;Lingual/palatal residue Oral - Thin Teaspoon -- Oral - Thin Cup Right anterior bolus loss Oral - Thin Straw -- Oral - Puree Reduced posterior propulsion;Lingual/palatal residue Oral - Mech Soft -- Oral - Regular Reduced posterior propulsion;Other (Comment) Oral - Multi-Consistency -- Oral - Pill -- Oral Phase - Comment --  CHL IP PHARYNGEAL PHASE 03/29/2021 Pharyngeal Phase Impaired Pharyngeal- Pudding Teaspoon -- Pharyngeal -- Pharyngeal- Pudding Cup -- Pharyngeal -- Pharyngeal- Honey Teaspoon -- Pharyngeal -- Pharyngeal- Honey Cup WFL Pharyngeal -- Pharyngeal- Nectar Teaspoon -- Pharyngeal -- Pharyngeal- Nectar Cup Delayed swallow initiation-pyriform sinuses;Pharyngeal residue - valleculae;Inter-arytenoid space residue  Pharyngeal -- Pharyngeal- Nectar Straw Penetration/Aspiration before swallow;Delayed swallow initiation-pyriform sinuses Pharyngeal Material enters airway, passes BELOW cords and not ejected out despite cough attempt by patient Pharyngeal- Thin Teaspoon -- Pharyngeal -- Pharyngeal- Thin Cup Penetration/Aspiration before swallow Pharyngeal Material enters airway, remains ABOVE vocal cords then ejected out Pharyngeal- Thin Straw -- Pharyngeal -- Pharyngeal- Puree Delayed swallow initiation-vallecula Pharyngeal -- Pharyngeal- Mechanical Soft -- Pharyngeal -- Pharyngeal- Regular Pharyngeal residue - valleculae Pharyngeal -- Pharyngeal- Multi-consistency -- Pharyngeal -- Pharyngeal- Pill -- Pharyngeal -- Pharyngeal Comment --  CHL IP CERVICAL ESOPHAGEAL PHASE 03/29/2021 Cervical Esophageal Phase (No Data) Pudding Teaspoon -- Pudding Cup -- Honey Teaspoon -- Honey Cup -- Nectar Teaspoon -- Nectar Cup -- Nectar Straw -- Thin Teaspoon -- Thin Cup -- Thin Straw -- Puree -- Mechanical Soft -- Regular -- Multi-consistency -- Pill -- Cervical Esophageal Comment -- [name] 03/29/2021, 2:53 PM                   Scheduled Meds:  carvedilol  25 mg Per Tube BID WC   Chlorhexidine Gluconate Cloth  6 each Topical Daily   diltiazem  60 mg Per Tube Q6H   feeding supplement (PROSource TF)  45 mL Per Tube Daily   free water  200 mL Per Tube Q2H   [START ON 03/31/2021] glimepiride  4 mg Per Tube Q breakfast   hydrALAZINE  10 mg Oral Q8H   insulin aspart  0-20 Units Subcutaneous Q4H   insulin detemir  15 Units Subcutaneous Q24H   ipratropium-albuterol  3 mL Nebulization BID   lisinopril  40 mg Per Tube Daily   mouth rinse  15 mL Mouth Rinse BID   melatonin  5 mg Per Tube QHS   methylPREDNISolone (SOLU-MEDROL) injection  60 mg Intravenous Q12H   pantoprazole sodium  40 mg Per Tube BID   QUEtiapine  25 mg Oral QHS   senna-docusate  1 tablet Per Tube BID   Continuous Infusions:  feeding supplement (JEVITY  1.2 CAL) 1,000 mL (03/29/21 1659)     LOS: 7 days    Time spent: [place], MD [facility]  If 7PM-7AM, please contact night-coverage  03/30/2021, 1:12 PM

## 2021-03-30 NOTE — Progress Notes (Signed)
Inpatient Rehabilitation Admissions Coordinator   Inpatient rehab consult received. I met at bedside with patient with her granddaughter. We discussed goals and expectations of a possible CIR admit. Granddaughter states family has already discussed their ability to provide 24/7 physical assist either at pt's home or daughter's. I await further progress with therapies as well as medical readiness for an inpt rehab admit. Feel she could benefit and would be a good candidate.  Danne Baxter, RN, MSN Rehab Admissions Coordinator [phone] 03/30/2021 12:45 PM

## 2021-03-31 LAB — GLUCOSE, CAPILLARY
Glucose-Capillary: 134 mg/dL — ABNORMAL HIGH (ref 70–99)
Glucose-Capillary: 155 mg/dL — ABNORMAL HIGH (ref 70–99)
Glucose-Capillary: 196 mg/dL — ABNORMAL HIGH (ref 70–99)
Glucose-Capillary: 199 mg/dL — ABNORMAL HIGH (ref 70–99)
Glucose-Capillary: 346 mg/dL — ABNORMAL HIGH (ref 70–99)
Glucose-Capillary: 404 mg/dL — ABNORMAL HIGH (ref 70–99)

## 2021-03-31 LAB — BASIC METABOLIC PANEL
Anion gap: 9 (ref 5–15)
BUN: 32 mg/dL — ABNORMAL HIGH (ref 8–23)
CO2: 27 mmol/L (ref 22–32)
Calcium: 8.9 mg/dL (ref 8.9–10.3)
Chloride: 114 mmol/L — ABNORMAL HIGH (ref 98–111)
Creatinine, Ser: 0.8 mg/dL (ref 0.44–1.00)
GFR, Estimated: >60 mL/min (ref >60)
Glucose, Bld: 178 mg/dL — ABNORMAL HIGH (ref 70–99)
Potassium: 2.9 mmol/L — ABNORMAL LOW (ref 3.5–5.1)
Sodium: 150 mmol/L — ABNORMAL HIGH (ref 135–145)

## 2021-03-31 MED ORDER — POTASSIUM CHLORIDE 10 MEQ/100ML IV SOLN
10.0000 meq | INTRAVENOUS | Status: AC
  Administered 2021-03-31 (×4): 10 meq via INTRAVENOUS
  Filled 2021-03-31 (×4): qty 100

## 2021-03-31 MED ORDER — HYDRALAZINE HCL 10 MG PO TABS: 10.0000 mg | ORAL_TABLET | Freq: Three times a day (TID) | ORAL | 0 refills | Status: DC

## 2021-03-31 MED ORDER — APIXABAN 5 MG PO TABS
5.0000 mg | ORAL_TABLET | Freq: Two times a day (BID) | ORAL | Status: DC
  Administered 2021-03-31 – 2021-04-01 (×2): 5 mg via ORAL
  Filled 2021-03-31 (×2): qty 1

## 2021-03-31 MED ORDER — APIXABAN 5 MG PO TABS: 5.0000 mg | ORAL_TABLET | Freq: Two times a day (BID) | ORAL | 0 refills | Status: DC

## 2021-03-31 MED ORDER — QUETIAPINE FUMARATE 25 MG PO TABS: 25.0000 mg | ORAL_TABLET | Freq: Every day | ORAL | 0 refills | Status: DC

## 2021-03-31 MED ORDER — PREDNISONE 10 MG (21) PO TBPK
10.0000 mg | ORAL_TABLET | ORAL | Status: AC
  Administered 2021-03-31: 10 mg via ORAL

## 2021-03-31 MED ORDER — PREDNISONE 10 MG (21) PO TBPK
10.0000 mg | ORAL_TABLET | Freq: Three times a day (TID) | ORAL | Status: DC
  Administered 2021-04-01: 10 mg via ORAL

## 2021-03-31 MED ORDER — PANTOPRAZOLE SODIUM 40 MG PO PACK: 40.0000 mg | PACK | Freq: Two times a day (BID) | ORAL | 0 refills | Status: DC

## 2021-03-31 MED ORDER — DILTIAZEM 12 MG/ML ORAL SUSPENSION: 60.0000 mg | Freq: Four times a day (QID) | ORAL | 0 refills | Status: DC

## 2021-03-31 MED ORDER — INSULIN ASPART 100 UNIT/ML IJ SOLN: 0.0000 [IU] | INTRAMUSCULAR | 11 refills | Status: DC

## 2021-03-31 MED ORDER — FOOD THICKENER (SIMPLYTHICK HONEY)
1.0000 | ORAL | Status: DC | PRN
  Filled 2021-03-31: qty 1

## 2021-03-31 MED ORDER — POTASSIUM CHLORIDE 10 MEQ/100ML IV SOLN: 10.0000 meq | INTRAVENOUS | 0 refills | Status: DC

## 2021-03-31 MED ORDER — APIXABAN 5 MG PO TABS: 5.0000 mg | ORAL_TABLET | Freq: Two times a day (BID) | ORAL | Status: DC

## 2021-03-31 MED ORDER — PREDNISONE 10 MG (21) PO TBPK: 10.0000 mg | ORAL_TABLET | Freq: Four times a day (QID) | ORAL | Status: DC

## 2021-03-31 MED ORDER — METOPROLOL TARTRATE 5 MG/5ML IV SOLN: 5.0000 mg | Freq: Four times a day (QID) | INTRAVENOUS | 0 refills | Status: DC | PRN

## 2021-03-31 MED ORDER — LISINOPRIL 20 MG PO TABS: 40.0000 mg | ORAL_TABLET | Freq: Every day | ORAL | 0 refills | Status: DC

## 2021-03-31 MED ORDER — PREDNISONE 10 MG (21) PO TBPK
20.0000 mg | ORAL_TABLET | Freq: Every evening | ORAL | Status: AC
  Administered 2021-03-31: 20 mg via ORAL

## 2021-03-31 MED ORDER — PREDNISONE 10 MG (21) PO TBPK: 20.0000 mg | ORAL_TABLET | Freq: Every evening | ORAL | Status: DC

## 2021-03-31 MED ORDER — INSULIN DETEMIR 100 UNIT/ML ~~LOC~~ SOLN: 15.0000 [IU] | SUBCUTANEOUS | 11 refills | Status: DC

## 2021-03-31 MED ORDER — PREDNISONE 10 MG (21) PO TBPK
20.0000 mg | ORAL_TABLET | Freq: Every morning | ORAL | Status: AC
  Administered 2021-04-01: 20 mg via ORAL
  Filled 2021-03-31: qty 21

## 2021-03-31 MED ORDER — ACETAMINOPHEN 325 MG PO TABS: 650.0000 mg | ORAL_TABLET | ORAL | Status: DC | PRN

## 2021-03-31 MED ORDER — FOOD THICKENER (SIMPLYTHICK HONEY): 1.0000 | ORAL | Status: DC | PRN

## 2021-03-31 NOTE — Progress Notes (Addendum)
STROKE TEAM PROGRESS NOTE   SUBJECTIVE (INTERVAL HISTORY) Stable. Now tol 50% of meals. Plan to remove Core track today. She is stable and plan was for d/c today to [facility], but family declines this offer and want to bring her home. It will take 24-48hr to arrange all the DME for dc home to be possible with [facility] services.    OBJECTIVE Temp:  [97.5 F (36.4 C)-98.3 F (36.8 C)] 97.6 F (36.4 C) (06/24 1112) Pulse Rate:  [77-91] 85 (06/24 0415) Cardiac Rhythm: Atrial fibrillation (06/24 0700) Resp:  [13-20] 20 (06/24 1139) BP: (153-196)/(79-122) 167/84 (06/24 1139) SpO2:  [92 %-100 %] 100 % (06/24 1139)  Recent Labs  Lab 03/30/21 2006 03/30/21 2324 03/31/21 0358 03/31/21 0804 03/31/21 1121  GLUCAP 295* 190* 155* 134* 196*    Recent Labs  Lab  0000 03/24/21 1650 03/25/21 0610 03/25/21 0921 03/25/21 1627 03/25/21 2022 03/26/21 0300 03/26/21 0907 03/27/21 0424 03/27/21 1238 03/28/21 0240 03/28/21 0921 03/29/21 0410 03/30/21 0242 03/31/21 0326  NA  --   --  135   < > 137   < > 148*  148*   < > 160*   < > 163* 165* 164* 156* 150*  K   < >  --  3.4*  --   --   --  3.7   < > 3.6  --  3.4*  --  3.5 3.2* 2.9*  CL   < >  --  101  --   --   --  120*  --  129*  --  >130*  --  >130* 123* 114*  CO2   < >  --  23  --   --   --  22  --  20*  --  21*  --  22 25 27   GLUCOSE   < >  --  256*  --   --   --  204*  --  365*  --  188*  --  156* 236* 178*  BUN   < >  --  34*  --   --   --  38*  --  50*  --  56*  --  54* 42* 32*  CREATININE   < >  --  1.28*  --   --   --  1.02*  --  1.45*  --  1.22*  --  0.98 0.90 0.80  CALCIUM   < >  --  9.1  --   --   --  8.4*  --  8.9  --  9.3  --  9.2 8.9 8.9  MG  --  1.8 2.1  --  2.1  --  2.2  --   --   --   --   --   --   --   --   PHOS  --  2.5 3.0  --  3.5  --  3.2  --   --   --   --   --   --   --   --    < > = values in this interval not displayed.    No results for input(s): AST, ALT, ALKPHOS, BILITOT, PROT, ALBUMIN in the last 168 hours.  Recent  Labs  Lab 03/25/21 0610 03/26/21 0300 03/26/21 1849 03/27/21 0424 03/28/21 0240 03/29/21 0410  WBC 17.0* 13.1*  --  14.4* 20.3* 19.0*  HGB 13.8 12.6 11.6* 12.2 12.1 12.5  HCT 41.0 37.9 34.0* 38.0 37.2 39.1  MCV 88.0  90.7  --  93.1 93.0 93.5  PLT 238 181  --  218 245 238    No results for input(s): CKTOTAL, CKMB, CKMBINDEX, TROPONINI in the last 168 hours. No results for input(s): LABPROT, INR in the last 72 hours.  No results for input(s): COLORURINE, LABSPEC, [place], GLUCOSEU, HGBUR, BILIRUBINUR, KETONESUR, PROTEINUR, UROBILINOGEN, NITRITE, LEUKOCYTESUR in the last 72 hours.  Invalid input(s): APPERANCEUR       Component Value Date/Time   CHOL 126 03/24/2021 0510   CHOL 191 11/05/2019 0902   TRIG 1,219 (H) 03/24/2021 0510   HDL 27 (L) 03/24/2021 0510   HDL 39 (L) 11/05/2019 0902   CHOLHDL NOT REPORTED DUE TO HIGH TRIGLYCERIDES 03/24/2021 0510   VLDL UNABLE TO CALCULATE IF TRIGLYCERIDE OVER 400 mg/dL 03/24/2021 0510   LDLCALC UNABLE TO CALCULATE IF TRIGLYCERIDE OVER 400 mg/dL 03/24/2021 0510   LDLCALC 119 (H) 11/05/2019 0902   Lab Results  Component Value Date   HGBA1C 8.8 (H) 03/24/2021      Component Value Date/Time   LABOPIA NONE DETECTED 03/24/2021 0126   COCAINSCRNUR NONE DETECTED 03/24/2021 0126   LABBENZ NONE DETECTED 03/24/2021 0126   AMPHETMU NONE DETECTED 03/24/2021 0126   THCU NONE DETECTED 03/24/2021 0126   LABBARB NONE DETECTED 03/24/2021 0126    No results for input(s): ETH in the last 168 hours.   I have personally reviewed the radiological images below and agree with the radiology interpretations.  CT Head Wo Contrast  Result Date: 03/28/2021 CLINICAL DATA:  Stroke, follow-up EXAM: CT HEAD WITHOUT CONTRAST TECHNIQUE: Contiguous axial images were obtained from the base of the skull through the vertex without intravenous contrast. COMPARISON:  03/26/2021 FINDINGS: Brain: Areas of evolving recent infarction with hemorrhagic conversion again  identified in the left frontal, parietal, and lateral occipital lobes. Similar associated mass effect remains mild. Unchanged small left temporal, likely subarachnoid hemorrhage. No new hemorrhage or loss of gray-white differentiation. Ventricles are stable in size. Vascular: No new findings. Skull: Calvarium is unremarkable. Sinuses/Orbits: No acute finding. Other: None. 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 Unchanged small left temporal, likely subarachnoid hemorrhage. No new hemorrhage or worsening mass effect. Electronically Signed   By: Macy Mis M.D.   On: 03/28/2021 10:43   CT HEAD WO CONTRAST  Result Date: 03/26/2021 CLINICAL DATA:  Stroke follow-up EXAM: CT HEAD WITHOUT CONTRAST TECHNIQUE: Contiguous axial images were obtained from the base of the skull through the vertex without intravenous contrast. COMPARISON:  CT head 03/25/2021 FINDINGS: Brain: Acute infarct in the left middle frontal lobe unchanged with minimal hemorrhage, unchanged. Acute infarct left parietal lobe with hemorrhagic transformation unchanged from yesterday. Infarct also extends into the left occipital lobe hemorrhagic transformation unchanged. Small amount of hemorrhage in the left temporal lobe likely subarachnoid hemorrhage is unchanged. Slight midline shift to the right unchanged. Negative for hydrocephalus. No new area of infarct. Vascular: Negative for hyperdense vessel Skull: Negative Sinuses/Orbits: Mucosal edema paranasal sinuses.  Negative orbit Other: None IMPRESSION: No change from yesterday. Acute infarct in the left MCA and left PCA territory with hemorrhagic transformation in the left occipital and parietal lobes. Acute infarct left middle frontal gyrus with minimal hemorrhage, unchanged. No new area of hemorrhage or infarct. Electronically Signed   By: Franchot Gallo M.D.   On: 03/26/2021 18:47   CT HEAD WO CONTRAST  Result Date: 03/25/2021 CLINICAL DATA:  75 year old  female code stroke presentation status post IV tPA. Left MCA territory infarct with mild hemorrhagic changes. EXAM:  CT HEAD WITHOUT CONTRAST TECHNIQUE: Contiguous axial images were obtained from the base of the skull through the vertex without intravenous contrast. COMPARISON:  Head CT 03/24/2021 and earlier. FINDINGS: Brain: Mixed cytotoxic edema and rounded, nodular areas of hemorrhage redemonstrated in the left MCA territory. The several dominant foci of blood in the posterior left frontal and left parietal lobes continuing toward the left occipital pole are stable and size and configuration since yesterday (e.g. Series 4, image 22). Partial effacement of the left lateral ventricle and 2-3 mm of rightward midline shift are stable. No intraventricular hemorrhage. Probable gyral petechial hemorrhage at the left temporal operculum is stable on series 4, image 18. No definite extra-axial hemorrhage. Right hemisphere and posterior fossa gray-white matter differentiation is stable and within normal limits. No ventriculomegaly. Basilar cisterns remain patent. Vascular: Calcified atherosclerosis at the skull base. Skull: No acute osseous abnormality identified. Sinuses/Orbits: Visualized paranasal sinuses and mastoids are stable and well aerated. Other: Stable right nasoenteric tube. Mild leftward gaze is stable. Visualized scalp soft tissues are within normal limits. IMPRESSION: 1. Stable Left MCA infarct with hemorrhagic transformation since yesterday. Extension to the Left MCA/PCA watershed territory as before. Partially effaced left lateral ventricle and trace rightward midline shift are stable. No extra-axial extension of blood. 2. No new areas of involvement or new intracranial abnormality. Electronically Signed   By: Genevie Ann M.D.   On: 03/25/2021 05:19   CT HEAD WO CONTRAST  Result Date: 03/24/2021 CLINICAL DATA:  Stroke.  24 hours post tPA EXAM: CT HEAD WITHOUT CONTRAST TECHNIQUE: Contiguous axial images were  obtained from the base of the skull through the vertex without intravenous contrast. COMPARISON:  CT head 03/23/2021 FINDINGS: Brain: Interval development of left MCA infarct with hypodensity in the left frontal and parietal lobe. There is also involvement of the left occipital lobe. There is hemorrhagic transformation of the infarct involving the left temporal and parietal lobe with a moderate amount of blood present. Ventricle size normal.  No midline shift. Vascular: Negative for hyperdense vessel Skull: Negative Sinuses/Orbits: Mucosal edema paranasal sinuses. Feeding tube in place. Air-fluid level sphenoid sinus. Negative orbit. Other: None IMPRESSION: Interval development of acute infarct in the left MCA territory with hypodensity in the left temporal and parietal lobe. This also extends into the left occipital lobe. There has been interval hemorrhagic transformation of the infarct with moderate amount of blood present within the infarction. Dr. Cheral Marker  has been paged to discuss the results. Electronically Signed   By: Franchot Gallo M.D.   On: 03/24/2021 18:30   DG CHEST PORT 1 VIEW  Result Date: 03/28/2021 CLINICAL DATA:  Hypoxia.  Stroke. EXAM: PORTABLE CHEST 1 VIEW COMPARISON:  03/26/2021. 11/20/2018. FINDINGS: Feeding tube noted with tip below left hemidiaphragm. Cardiomegaly again noted. Left atrial appendage clip in stable position. Pulmonary venous congestion. Bilateral interstitial prominence again noted. Findings suggest CHF. Low lung volumes. Small left pleural effusion may be present. No pneumothorax. Surgical clips left upper quadrant. IMPRESSION: 1.  Feeding tube noted with tip below left hemidiaphragm. 2. Cardiomegaly. Left atrial appendage clip in stable position. Bilateral interstitial prominence. Findings suggest congestive heart failure with interstitial edema. Small left pleural effusion may be present. Electronically Signed   By: Marcello Moores  Register   On: 03/28/2021 11:13   DG CHEST  PORT 1 VIEW  Result Date: 03/26/2021 CLINICAL DATA:  Respiratory distress. EXAM: PORTABLE CHEST 1 VIEW COMPARISON:  Chest radiograph 03/24/2021. FINDINGS: Enteric tube courses inferior to the diaphragm. Stable cardiomegaly status  post median sternotomy. Bilateral interstitial pulmonary opacities. No pleural effusion or pneumothorax. Thoracic spine degenerative changes. IMPRESSION: Cardiomegaly with mild interstitial opacities suggestive of edema. Electronically Signed   By: Lovey Newcomer M.D.   On: 03/26/2021 16:56   DG CHEST PORT 1 VIEW  Result Date: 03/24/2021 CLINICAL DATA:  Fevers EXAM: PORTABLE CHEST 1 VIEW COMPARISON:  11/20/2018 FINDINGS: Cardiac shadow is enlarged. Postsurgical changes are again seen. Feeding catheter is noted extending into the stomach. Lungs are hypoinflated. Mild vascular congestion is noted without parenchymal edema. Left basilar opacity is noted which may represent early infiltrate. IMPRESSION: Mild CHF without pulmonary edema. Left basilar opacity which may represent an early infiltrate. Electronically Signed   By: Inez Catalina M.D.   On: 03/24/2021 17:54   DG Abd Portable 1V  Result Date: 03/24/2021 CLINICAL DATA:  Feeding tube placement EXAM: PORTABLE ABDOMEN - 1 VIEW COMPARISON:  Portable exam 1314 hours without priors for comparison FINDINGS: Tip of feeding tube projects over distal gastric antrum. Visualized bowel gas pattern normal. Atherosclerotic calcifications aorta and splenic artery. Prior median sternotomy and LEFT atrial appendage clipping. Subsegmental atelectasis at LEFT lung base. IMPRESSION: Tip of feeding tube projects over distal gastric antrum. Electronically Signed   By: Lavonia Dana M.D.   On: 03/24/2021 13:40   DG Swallowing Func-Speech Pathology  Result Date: 03/29/2021 Formatting of this result is different from the original. Objective Swallowing Evaluation: Type of Study: MBS-Modified Barium Swallow Study  Patient Details Name: Evelisse Szalkowski MRN:  [id number] Date of Birth: January 01, 1946 Today's Date: 03/29/2021 Time: SLP Start Time (ACUTE ONLY): 68 -SLP Stop Time (ACUTE ONLY): 4098 SLP Time Calculation (min) (ACUTE ONLY): 16 min Past Medical History: Past Medical History: Diagnosis Date  Allergy   Asthma   Atrial fibrillation ([place])   Chronic combined systolic and diastolic heart failure ([place]) 05/14/2018  Coronary artery disease   Diverticulitis   Diverticulitis   Hypertension   Pure hypercholesterolemia 05/14/2018  Stented coronary artery  Past Surgical History: Past Surgical History: Procedure Laterality Date  ABDOMINAL HYSTERECTOMY    CARDIAC CATHETERIZATION    CARDIOVERSION N/A 12/21/2014  Procedure: CARDIOVERSION;  Surgeon: Adrian Prows, MD;  Location: [facility];  Service: Cardiovascular;  Laterality: N/A;  CORONARY ANGIOPLASTY    CORONARY ARTERY BYPASS GRAFT   HPI: Mihira Tozzi is an 75 y.o. female adm with aphasia - receptive and expressive - Found to have Left MCA CVA and is s/p tPa.   PMH + for atral fibrillation, asthma, chronic combined systolic and diastolic HF, CAD, HTN, hypercholesterolemia and CAD s/p angioplasty and CABG who presents with acute onset of dense receptive and expressive aphasia.  CT showed  Interval development of acute infarct in the left MCA territory with hypodensity in the left temporal and parietal lobe. This also extends into the left occipital lobe.  CXR 6/17 concerning for edema/Mild CHF, Left basilar opacity which may represent an early infiltrate. Pt has not passed  [facility] swallow screen and has not been ready for po.  Has Cortrak in place at this time.  Has been lethargic with some increased Respiratory demands and per RN, CCM has been consulted.  No data recorded Assessment / Plan / Recommendation CHL IP CLINICAL IMPRESSIONS 03/29/2021 Clinical Impression Pt's oropharyngeal dysphagia is marked by incoordinated oral to pharyngeal transit and poorly timed laryngeal closure leading to mild aspiration. Xerostomia impacted oral  transit with cracker and tongue base residue with thicker textures. Using a straw with nectar pt consumed larger sip which entered laryngeal  vestibule pre closure, aspirated during swallow (trace) then pt immediately exhaled post swallow blowing bubbles into cup.  Delayed cough was present. Thin penetrated prior to swallow and appeared to spontaneously clear. Most consistencies reached valleculae or pyriform sinuses for >3-5 seconds before swallow onset. Coordination of swallows and respirations was decreased and apniec period perhaps shortened. She is impulsive and thin liquids not recommended at this time. Nectar thick via TEASPOON, puree, pills crushed, full supervision and controlled environment with meals. SLP Visit Diagnosis Dysphagia, oropharyngeal phase (R13.12) Attention and concentration deficit following -- Frontal lobe and executive function deficit following -- Impact on safety and function Moderate aspiration risk   CHL IP TREATMENT RECOMMENDATION 03/29/2021 Treatment Recommendations Therapy as outlined in treatment plan below   Prognosis 03/29/2021 Prognosis for Safe Diet Advancement Good Barriers to Reach Goals -- Barriers/Prognosis Comment -- CHL IP DIET RECOMMENDATION 03/29/2021 SLP Diet Recommendations Dysphagia 1 (Puree) solids;Nectar thick liquid Liquid Administration via Spoon Medication Administration Crushed with puree Compensations Slow rate;Small sips/bites;Clear throat intermittently Postural Changes Seated upright at 90 degrees   CHL IP OTHER RECOMMENDATIONS 03/29/2021 Recommended Consults -- Oral Care Recommendations Oral care BID Other Recommendations --   CHL IP FOLLOW UP RECOMMENDATIONS 03/29/2021 Follow up Recommendations Inpatient Rehab   CHL IP FREQUENCY AND DURATION 03/29/2021 Speech Therapy Frequency (ACUTE ONLY) min 2x/week Treatment Duration 2 weeks      CHL IP ORAL PHASE 03/29/2021 Oral Phase Impaired Oral - Pudding Teaspoon -- Oral - Pudding Cup -- Oral - Honey Teaspoon -- Oral -  Honey Cup Reduced posterior propulsion Oral - Nectar Teaspoon -- Oral - Nectar Cup Reduced posterior propulsion;Lingual/palatal residue Oral - Nectar Straw Reduced posterior propulsion;Lingual/palatal residue Oral - Thin Teaspoon -- Oral - Thin Cup Right anterior bolus loss Oral - Thin Straw -- Oral - Puree Reduced posterior propulsion;Lingual/palatal residue Oral - Mech Soft -- Oral - Regular Reduced posterior propulsion;Other (Comment) Oral - Multi-Consistency -- Oral - Pill -- Oral Phase - Comment --  CHL IP PHARYNGEAL PHASE 03/29/2021 Pharyngeal Phase Impaired Pharyngeal- Pudding Teaspoon -- Pharyngeal -- Pharyngeal- Pudding Cup -- Pharyngeal -- Pharyngeal- Honey Teaspoon -- Pharyngeal -- Pharyngeal- Honey Cup WFL Pharyngeal -- Pharyngeal- Nectar Teaspoon -- Pharyngeal -- Pharyngeal- Nectar Cup Delayed swallow initiation-pyriform sinuses;Pharyngeal residue - valleculae;Inter-arytenoid space residue Pharyngeal -- Pharyngeal- Nectar Straw Penetration/Aspiration before swallow;Delayed swallow initiation-pyriform sinuses Pharyngeal Material enters airway, passes BELOW cords and not ejected out despite cough attempt by patient Pharyngeal- Thin Teaspoon -- Pharyngeal -- Pharyngeal- Thin Cup Penetration/Aspiration before swallow Pharyngeal Material enters airway, remains ABOVE vocal cords then ejected out Pharyngeal- Thin Straw -- Pharyngeal -- Pharyngeal- Puree Delayed swallow initiation-vallecula Pharyngeal -- Pharyngeal- Mechanical Soft -- Pharyngeal -- Pharyngeal- Regular Pharyngeal residue - valleculae Pharyngeal -- Pharyngeal- Multi-consistency -- Pharyngeal -- Pharyngeal- Pill -- Pharyngeal -- Pharyngeal Comment --  CHL IP CERVICAL ESOPHAGEAL PHASE 03/29/2021 Cervical Esophageal Phase (No Data) Pudding Teaspoon -- Pudding Cup -- Honey Teaspoon -- Honey Cup -- Nectar Teaspoon -- Nectar Cup -- Nectar Straw -- Thin Teaspoon -- Thin Cup -- Thin Straw -- Puree -- Mechanical Soft -- Regular -- Multi-consistency -- Pill  -- Cervical Esophageal Comment -- [name] 03/29/2021, 2:53 PM              EEG adult  Result Date: 03/26/2021 Lora Havens, MD     03/26/2021  9:04 PM Patient Name: Luverne Farone MRN: [id number] Epilepsy Attending: Lora Havens Referring Physician/Provider: Dr Amie [place] Date: 03/26/2021 Duration: 24.33 mins Patient history: 74yo F  with L MCA stroke, continues to be altered. EEG to evaluate for status epilepticus. Level of alertness: lethargic AEDs during EEG study: None Technical aspects: This EEG was obtained using a 10 lead EEG system positioned circumferentially without any parasagittal coverage (rapid EEG). Computer selected EEG is reviewed as well as background features and all clinically significant events.  Description: EEG showed continuous generalized and lateralized left hemisphere polymorphic 3 to 6 Hz theta-delta slowing.  Hyperventilation and photic stimulation were not performed.   ABNORMALITY - Continuous slow, generalized and lateralized left hemisphere IMPRESSION: This study is  suggestive of cortical dysfunction arising from left hemisphere, likely secondary to underlying stroke as well as severe diffuse encephalopathy, nonspecific etiology. No seizures or epileptiform discharges were seen throughout the recording. Dr Rory Percy was notified. Lora Havens   ECHOCARDIOGRAM COMPLETE  Result Date: 03/24/2021    ECHOCARDIOGRAM REPORT   Patient Name:   SHERMAN LIPUMA Date of Exam: 03/24/2021 Medical Rec #:  [id number]       Height:       63.0 in Accession #:    [id number]      Weight:       203.7 lb Date of Birth:  02-17-1946      BSA:          1.949 m Patient Age:    44 years        BP:           169/83 mmHg Patient Gender: F               HR:           109 bpm. Exam Location:  Inpatient Procedure: 2D Echo Indications:    stroke  History:        Patient has prior history of Echocardiogram examinations, most                 recent 05/26/2018. CAD, Arrythmias:Atrial  Fibrillation; Risk                 Factors:Diabetes and Hypertension.  Sonographer:    Johny Chess Referring Phys: [id number] HUNTER J COLLINS  Sonographer Comments: Suboptimal apical window. Image acquisition challenging due to uncooperative patient, Image acquisition challenging due to patient body habitus and restraints. kept moving. IMPRESSIONS  1. Left ventricular ejection fraction, by estimation, is 60 to 65%. The left ventricle has normal function. The left ventricle has no regional wall motion abnormalities. Left ventricular diastolic function could not be evaluated.  2. Right ventricular systolic function is normal. The right ventricular size is normal.  3. The mitral valve is grossly normal. No evidence of mitral valve regurgitation. No evidence of mitral stenosis.  4. The aortic valve is grossly normal. Aortic valve regurgitation is mild. No aortic stenosis is present. FINDINGS  Left Ventricle: Left ventricular ejection fraction, by estimation, is 60 to 65%. The left ventricle has normal function. The left ventricle has no regional wall motion abnormalities. The left ventricular internal cavity size was normal in size. There is  borderline left ventricular hypertrophy. Left ventricular diastolic function could not be evaluated due to atrial fibrillation. Left ventricular diastolic function could not be evaluated. Right Ventricle: The right ventricular size is normal. Right vetricular wall thickness was not well visualized. Right ventricular systolic function is normal. Left Atrium: Left atrial size was normal in size. Right Atrium: Right atrial size was not well visualized. Pericardium: There is no evidence of pericardial effusion. Mitral Valve: The mitral valve is grossly normal. No  evidence of mitral valve regurgitation. No evidence of mitral valve stenosis. Tricuspid Valve: The tricuspid valve is grossly normal. Tricuspid valve regurgitation is not demonstrated. Aortic Valve: The aortic valve is  grossly normal. Aortic valve regurgitation is mild. No aortic stenosis is present. Pulmonic Valve: The pulmonic valve was grossly normal. Pulmonic valve regurgitation is not visualized. Aorta: The aortic root and ascending aorta are structurally normal, with no evidence of dilitation. IAS/Shunts: The atrial septum is grossly normal.  LEFT VENTRICLE PLAX 2D LVIDd:         4.60 cm LVIDs:         3.40 cm LV PW:         1.10 cm LV IVS:        1.30 cm LVOT diam:     1.90 cm LVOT Area:     2.84 cm  IVC IVC diam: 2.20 cm LEFT ATRIUM           Index LA diam:      4.40 cm 2.26 cm/m LA Vol (A4C): 60.2 ml 30.89 ml/m   AORTA Ao Root diam: 2.80 cm Ao Asc diam:  3.20 cm  SHUNTS Systemic Diam: 1.90 cm Mertie Moores MD Electronically signed by Mertie Moores MD Signature Date/Time: 03/24/2021/5:19:01 PM    Final    CT HEAD CODE STROKE WO CONTRAST  Result Date: 03/23/2021 CLINICAL DATA:  Code stroke.  Acute neuro deficit.  Aphasia EXAM: CT HEAD WITHOUT CONTRAST TECHNIQUE: Contiguous axial images were obtained from the base of the skull through the vertex without intravenous contrast. COMPARISON:  None. FINDINGS: Brain: Mild atrophy and mild white matter hypodensity bilaterally. Negative for acute infarct, hemorrhage, mass. Vascular: Negative for hyperdense vessel Skull: Negative Sinuses/Orbits: Mild mucosal edema paranasal sinuses. Negative orbit Other: None ASPECTS (Smith Stroke Program Early CT Score) - Ganglionic level infarction (caudate, lentiform nuclei, internal capsule, insula, M1-M3 cortex): 7 - Supraganglionic infarction (M4-M6 cortex): 3 Total score (0-10 with 10 being normal): 10 IMPRESSION: 1. No acute abnormality. 2. ASPECTS is 10 3. Code stroke imaging results were communicated on 03/23/2021 at 6:59 pm to provider [place] via text page Electronically Signed   By: Franchot Gallo M.D.   On: 03/23/2021 19:00   CT ANGIO HEAD CODE STROKE  Result Date: 03/23/2021 CLINICAL DATA:  Expressive aphasia EXAM: CT  ANGIOGRAPHY HEAD AND NECK TECHNIQUE: Multidetector CT imaging of the head and neck was performed using the standard protocol during bolus administration of intravenous contrast. Multiplanar CT image reconstructions and MIPs were obtained to evaluate the vascular anatomy. Carotid stenosis measurements (when applicable) are obtained utilizing NASCET criteria, using the distal internal carotid diameter as the denominator. CONTRAST:  29mL OMNIPAQUE IOHEXOL 350 MG/ML SOLN COMPARISON:  None. FINDINGS: CTA NECK FINDINGS SKELETON: There is no bony spinal canal stenosis. No lytic or blastic lesion. OTHER NECK: Normal pharynx, larynx and major salivary glands. No cervical lymphadenopathy. Unremarkable thyroid gland. UPPER CHEST: No pneumothorax or pleural effusion. No nodules or masses. AORTIC ARCH: There is calcific atherosclerosis of the aortic arch. There is no aneurysm, dissection or hemodynamically significant stenosis of the visualized portion of the aorta. Conventional 3 vessel aortic branching pattern. The visualized proximal subclavian arteries are widely patent. RIGHT CAROTID SYSTEM: No dissection, occlusion or aneurysm. Mild atherosclerotic calcification at the carotid bifurcation without hemodynamically significant stenosis. LEFT CAROTID SYSTEM: No dissection, occlusion or aneurysm. Mild atherosclerotic calcification at the carotid bifurcation without hemodynamically significant stenosis. VERTEBRAL ARTERIES: Left dominant configuration. Both origins are clearly patent. There is no dissection,  occlusion or flow-limiting stenosis to the skull base (V1-V3 segments). CTA HEAD FINDINGS POSTERIOR CIRCULATION: --Vertebral arteries: Normal V4 segments. --Inferior cerebellar arteries: Normal. --Basilar artery: Normal. --Superior cerebellar arteries: Normal. --Posterior cerebral arteries (PCA): Normal. ANTERIOR CIRCULATION: --Intracranial internal carotid arteries: Normal. --Anterior cerebral arteries (ACA): Normal. Both A1  segments are present. Patent anterior communicating artery (a-comm). --Middle cerebral arteries (MCA): Normal. VENOUS SINUSES: As permitted by contrast timing, patent. ANATOMIC VARIANTS: None Review of the MIP images confirms the above findings. IMPRESSION: 1. No emergent large vessel occlusion or high-grade stenosis of the intracranial arteries. 2. Mild bilateral carotid bifurcation atherosclerosis without hemodynamically significant stenosis by NASCET criteria. Aortic Atherosclerosis (ICD10-I70.0). Electronically Signed   By: Ulyses Jarred M.D.   On: 03/23/2021 19:38   CT ANGIO NECK CODE STROKE  Result Date: 03/23/2021 : CLINICAL DATA:   Expressive aphasia EXAM: CT ANGIOGRAPHY HEAD AND NECK TECHNIQUE: Multidetector CT imaging of the head and neck was performed using the standard protocol during bolus administration of intravenous contrast. Multiplanar CT image reconstructions and MIPs were obtained to evaluate the vascular anatomy. Carotid stenosis measurements (when applicable) are obtained utilizing NASCET criteria, using the distal internal carotid diameter as the denominator. CONTRAST:   22mL OMNIPAQUE IOHEXOL 350 MG/ML SOLN COMPARISON:   None. FINDINGS: CTA NECK FINDINGS SKELETON: There is no bony spinal canal stenosis. No lytic or blastic lesion. OTHER NECK: Normal pharynx, larynx and major salivary glands. No cervical lymphadenopathy. Unremarkable thyroid gland. UPPER CHEST: No pneumothorax or pleural effusion. No nodules or masses. AORTIC ARCH: There is calcific atherosclerosis of the aortic arch. There is no aneurysm, dissection or hemodynamically significant stenosis of the visualized portion of the aorta. Conventional 3 vessel aortic branching pattern. The visualized proximal subclavian arteries are widely patent. RIGHT CAROTID SYSTEM: No dissection, occlusion or aneurysm. Mild atherosclerotic calcification at the carotid bifurcation without hemodynamically significant stenosis. LEFT CAROTID SYSTEM:  No dissection, occlusion or aneurysm. Mild atherosclerotic calcification at the carotid bifurcation without hemodynamically significant stenosis. VERTEBRAL ARTERIES: Left dominant configuration. Both origins are clearly patent. There is no dissection, occlusion or flow-limiting stenosis to the skull base (V1-V3 segments). CTA HEAD FINDINGS POSTERIOR CIRCULATION: --Vertebral arteries: Normal V4 segments. --Inferior cerebellar arteries: Normal. --Basilar artery: Normal. --Superior cerebellar arteries: Normal. --Posterior cerebral arteries (PCA): Normal. ANTERIOR CIRCULATION: --Intracranial internal carotid arteries: Normal. --Anterior cerebral arteries (ACA): Normal. Both A1 segments are present. Patent anterior communicating artery (a-comm). --Middle cerebral arteries (MCA): Normal. VENOUS SINUSES: As permitted by contrast timing, patent. ANATOMIC VARIANTS: None Review of the MIP images confirms the above findings. IMPRESSION: 1. No emergent large vessel occlusion or high-grade stenosis of the intracranial arteries. 2. Mild bilateral carotid bifurcation atherosclerosis without hemodynamically significant stenosis by NASCET criteria. Aortic Atherosclerosis (ICD10-I70.0). Electronically Signed   By: Ulyses Jarred M.D.   On: 03/23/2021 19:45      PHYSICAL EXAM  Temp:  [97.5 F (36.4 C)-98.3 F (36.8 C)] 97.6 F (36.4 C) (06/24 1112) Pulse Rate:  [77-91] 85 (06/24 0415) Resp:  [13-20] 20 (06/24 1139) BP: (153-196)/(79-122) 167/84 (06/24 1139) SpO2:  [92 %-100 %] 100 % (06/24 1139) Bilateral wheezing General -obese elderly Caucasian lady.  Not in distress.  Ophthalmologic - fundi not visualized due to noncooperation.  Cardiovascular - irregularly irregular heart rate and rhythm with afib on tele  Neuro - Alert. Sitting up in bed with her eyes open and is attentive to examiner. Global aphasia but speaks occasional words and short sentences and difficult to understand.  Now able to follow simple  with  pantomime. Left gaze preference, barely cross midline, tracking on the left, inconsistently blinking to visual threat on the left but not blinking to the right. Right mild facial droop. Tongue protrusion not cooperative.  Able to move right upper extremity against gravity but mild weakness of grip and intrinsic hand muscles. LUE antigravity. BLE withdraws to noxious with left more than right. UTA sensation and coordination given aphasia. Gait deferred due to right sided weakness.   ASSESSMENT/PLAN Ms. Ouida Abeyta is a 75 y.o. female with history of A. fib off AC, CHF, CAD, hypertension, hyperlipidemia, CAD status post CABG admitted for global aphasia.  tPA given.  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 fib not on AC Resultant global aphasia with right hemiparesis CT no acute abnormality CTA head and neck no LVO 2D Echo EF 60 to 65% LDL 80.9 HgbA1c 8.8 HTS for cerebral edema, NA goal 150-155, now off and was getting flushes via tube. Now that tube removed, will let come down naturally; monitoring SCDs for VTE prophylaxis No antithrombotic prior to admission, No antithrombotic within 24 hours of tPA due to hemorrhagic transformation; allowed 7 days before starting eliquis for Afib. Ongoing aggressive stroke risk factor management Therapy recommendations: CIR; but family refused and will take her home with 24/7 supervision and [facility] services Disposition: home with HH   Chronic A. fib with RVR Bradycardia to 7s Follows with Dr. Oval Linsey on 01/28/20 Home meds including Coreg and Cardizem on board Metoprolol IV as needed restarted home meds Cardiology consulted for assistance; continuing Coreg 25 BID for rate control per their recommendations Was on Coumadin before but taken off by patient self "concern that it was stiffening her arteries" "unwilling to take Eliquis or Xarelto because she does not know "what is in them."   taking a natural blood thinner  sold over-the-counter upon the recommendation of her herbalist. Have discussed initiating a DOAC after 5 to 7 days for atrial fibrillation with daughter- she is in agreement. Started on evening of 6/24.   Hypertension Unstable, high Was on Cardene drip, now off and on Coreg, Lisinopril. Lisinopril increased from 20 to 40 on 6/22 for better SBP control SBP goal is normotensive at this time   DM2 with uncontrolled hyperglycemia On Insulin drip per CCM while in ICU, now on SSI Appreciate CCM assistance A1C 8.8, uncontrolled with goal of less than 7.0 to control stroke risk factor         Asthma Exacerbation Required BiPAP at night Management per CCM, appreciated IV SM started; will wean down now given improvement   Hyperlipidemia Home meds: None LDL 80.9, goal < 70 Continue statin at discharge   Dysphagia   Speech following-cleared for dysphagia 1 pured thick liquid diet 03/30/2021, tol up to 50% meals now Cortrak for enteral nutrition will been removed today         AKI CKD Monitor renal function Renally does medications   Other Stroke Risk Factors Advanced age Obesity, Body mass index is 35.54 kg/m.  Coronary artery disease status post CABG CHF  Other Active Problems TG 1219, likely contamination from [employer], ARNP-C, ANVP-BC Pager: [phone]  I have personally obtained history,examined this patient, reviewed notes, independently viewed imaging studies, participated in medical decision making and plan of care.ROS completed by me personally and pertinent positives fully documented  I have made any additions or clarifications directly to the above note. Agree with note above.  Patient was ready to  be discharged to inpatient rehab today with patient's daughter has changed her mind and wants patient instead to go home.  Discussed with case manager patient will need some equipment and DME hence will cancel discharge for today patient likely to  discharge in the next 1 to 2 days and all equipment necessary for her care at home as needed available.  Greater than 50% time during this 25-minute visit was spent in counseling and coordination of care and discussion with care team.  Antony Contras, MD Medical Director [place] Pager: [phone] 03/31/2021 3:16 PM  To contact Stroke Continuity provider, please refer to [email or web address] After hours, contact General Neurology

## 2021-03-31 NOTE — Progress Notes (Signed)
PROGRESS NOTE    Danielle Payne  FBP:[id number] DOB: December 13, 1945 DOA: 03/23/2021 PCP: Glendale Chard, MD   Brief Narrative:  Pt initially in ICU with hemorrhagic conversion s/p TPA, Neuro is primary teama nd medicine consulted re glucose and BP   Assessment & Plan:   Active Problems:   Atrial fibrillation (HCC)   Stroke (cerebrum) (HCC)   HTN: Hydralazine added by primary team 6/22 monitor bp and titrate, most recent 157/83 No adjustment needed currently   DM: Now off insulin gtt Primary team added glimepiride 6/22 BG 155, 134 C/W SSI   CVA with hemorrhagic conversion Neuro primary No new recs from medicine   Atrial fin chronic with RVR Cards assisting C/w coreg 25bid Pt resistant o DOAC-primary team discussed with daughtewr who is in agreement   HTN: COREG, DILT HYDRALAIZNR AND LISINOPRIL As above Hydralazine recently added Titrate as needed   DM On levemir and SSI A1C 8.8   ASTHAM EXAC Stable currently C/W NEBS   HLD LDL 81, ABOVE goals Priamry evaluating for statin   AOCKD: Avoid renal toxic meds Monitor Function  WILL SIGN OFF, CALL WITH QUESTIONS  DVT prophylaxis: SCD/Compression stockings  Code Status: full    Code Status Orders  (From admission, onward)           Start     Ordered   03/26/21 2315  Full code  Continuous        03/26/21 2314           Code Status History     Date Active Date Inactive Code Status Order ID Comments User Context   03/26/2021 1913 03/26/2021 2314 DNR [id number]  Lanier Clam, MD Inpatient   03/23/2021 2022 03/26/2021 1913 Full Code [id number]  Gwinda Maine, MD ED   04/02/2019 1204 04/05/2019 1533 Full Code [id number]  Darliss Cheney, MD ED   11/20/2018 1208 11/21/2018 1606 Full Code [id number]  Elodia Florence., MD Inpatient   11/23/2014 0620 11/23/2014 1822 Full Code [id number]  Rise Patience, MD Inpatient      Family Communication: per primary  Disposition Plan:   per  primary Consults called: None Admission status: Inpatient   Consultants:  Im, ccm  Procedures:  CT Head Wo Contrast  Result Date: 03/28/2021 CLINICAL DATA:  Stroke, follow-up EXAM: CT HEAD WITHOUT CONTRAST TECHNIQUE: Contiguous axial images were obtained from the base of the skull through the vertex without intravenous contrast. COMPARISON:  03/26/2021 FINDINGS: Brain: Areas of evolving recent infarction with hemorrhagic conversion again identified in the left frontal, parietal, and lateral occipital lobes. Similar associated mass effect remains mild. Unchanged small left temporal, likely subarachnoid hemorrhage. No new hemorrhage or loss of gray-white differentiation. Ventricles are stable in size. Vascular: No new findings. Skull: Calvarium is unremarkable. Sinuses/Orbits: No acute finding. Other: None. 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 Unchanged small left temporal, likely subarachnoid hemorrhage. No new hemorrhage or worsening mass effect. Electronically Signed   By: Macy Mis M.D.   On: 03/28/2021 10:43   CT HEAD WO CONTRAST  Result Date: 03/26/2021 CLINICAL DATA:  Stroke follow-up EXAM: CT HEAD WITHOUT CONTRAST TECHNIQUE: Contiguous axial images were obtained from the base of the skull through the vertex without intravenous contrast. COMPARISON:  CT head 03/25/2021 FINDINGS: Brain: Acute infarct in the left middle frontal lobe unchanged with minimal hemorrhage, unchanged. Acute infarct left parietal lobe with hemorrhagic transformation unchanged from yesterday. Infarct also extends into the left occipital lobe hemorrhagic transformation unchanged. Small amount  of hemorrhage in the left temporal lobe likely subarachnoid hemorrhage is unchanged. Slight midline shift to the right unchanged. Negative for hydrocephalus. No new area of infarct. Vascular: Negative for hyperdense vessel Skull: Negative Sinuses/Orbits: Mucosal edema paranasal sinuses.  Negative  orbit Other: None IMPRESSION: No change from yesterday. Acute infarct in the left MCA and left PCA territory with hemorrhagic transformation in the left occipital and parietal lobes. Acute infarct left middle frontal gyrus with minimal hemorrhage, unchanged. No new area of hemorrhage or infarct. Electronically Signed   By: Franchot Gallo M.D.   On: 03/26/2021 18:47   CT HEAD WO CONTRAST  Result Date: 03/25/2021 CLINICAL DATA:  75 year old female code stroke presentation status post IV tPA. Left MCA territory infarct with mild hemorrhagic changes. EXAM: CT HEAD WITHOUT CONTRAST TECHNIQUE: Contiguous axial images were obtained from the base of the skull through the vertex without intravenous contrast. COMPARISON:  Head CT 03/24/2021 and earlier. FINDINGS: Brain: Mixed cytotoxic edema and rounded, nodular areas of hemorrhage redemonstrated in the left MCA territory. The several dominant foci of blood in the posterior left frontal and left parietal lobes continuing toward the left occipital pole are stable and size and configuration since yesterday (e.g. Series 4, image 22). Partial effacement of the left lateral ventricle and 2-3 mm of rightward midline shift are stable. No intraventricular hemorrhage. Probable gyral petechial hemorrhage at the left temporal operculum is stable on series 4, image 18. No definite extra-axial hemorrhage. Right hemisphere and posterior fossa gray-white matter differentiation is stable and within normal limits. No ventriculomegaly. Basilar cisterns remain patent. Vascular: Calcified atherosclerosis at the skull base. Skull: No acute osseous abnormality identified. Sinuses/Orbits: Visualized paranasal sinuses and mastoids are stable and well aerated. Other: Stable right nasoenteric tube. Mild leftward gaze is stable. Visualized scalp soft tissues are within normal limits. IMPRESSION: 1. Stable Left MCA infarct with hemorrhagic transformation since yesterday. Extension to the Left  MCA/PCA watershed territory as before. Partially effaced left lateral ventricle and trace rightward midline shift are stable. No extra-axial extension of blood. 2. No new areas of involvement or new intracranial abnormality. Electronically Signed   By: Genevie Ann M.D.   On: 03/25/2021 05:19   CT HEAD WO CONTRAST  Result Date: 03/24/2021 CLINICAL DATA:  Stroke.  24 hours post tPA EXAM: CT HEAD WITHOUT CONTRAST TECHNIQUE: Contiguous axial images were obtained from the base of the skull through the vertex without intravenous contrast. COMPARISON:  CT head 03/23/2021 FINDINGS: Brain: Interval development of left MCA infarct with hypodensity in the left frontal and parietal lobe. There is also involvement of the left occipital lobe. There is hemorrhagic transformation of the infarct involving the left temporal and parietal lobe with a moderate amount of blood present. Ventricle size normal.  No midline shift. Vascular: Negative for hyperdense vessel Skull: Negative Sinuses/Orbits: Mucosal edema paranasal sinuses. Feeding tube in place. Air-fluid level sphenoid sinus. Negative orbit. Other: None IMPRESSION: Interval development of acute infarct in the left MCA territory with hypodensity in the left temporal and parietal lobe. This also extends into the left occipital lobe. There has been interval hemorrhagic transformation of the infarct with moderate amount of blood present within the infarction. Dr. Cheral Marker  has been paged to discuss the results. Electronically Signed   By: Franchot Gallo M.D.   On: 03/24/2021 18:30   DG CHEST PORT 1 VIEW  Result Date: 03/28/2021 CLINICAL DATA:  Hypoxia.  Stroke. EXAM: PORTABLE CHEST 1 VIEW COMPARISON:  03/26/2021. 11/20/2018. FINDINGS: Feeding tube noted  with tip below left hemidiaphragm. Cardiomegaly again noted. Left atrial appendage clip in stable position. Pulmonary venous congestion. Bilateral interstitial prominence again noted. Findings suggest CHF. Low lung volumes. Small  left pleural effusion may be present. No pneumothorax. Surgical clips left upper quadrant. IMPRESSION: 1.  Feeding tube noted with tip below left hemidiaphragm. 2. Cardiomegaly. Left atrial appendage clip in stable position. Bilateral interstitial prominence. Findings suggest congestive heart failure with interstitial edema. Small left pleural effusion may be present. Electronically Signed   By: Marcello Moores  Register   On: 03/28/2021 11:13   DG CHEST PORT 1 VIEW  Result Date: 03/26/2021 CLINICAL DATA:  Respiratory distress. EXAM: PORTABLE CHEST 1 VIEW COMPARISON:  Chest radiograph 03/24/2021. FINDINGS: Enteric tube courses inferior to the diaphragm. Stable cardiomegaly status post median sternotomy. Bilateral interstitial pulmonary opacities. No pleural effusion or pneumothorax. Thoracic spine degenerative changes. IMPRESSION: Cardiomegaly with mild interstitial opacities suggestive of edema. Electronically Signed   By: Lovey Newcomer M.D.   On: 03/26/2021 16:56   DG CHEST PORT 1 VIEW  Result Date: 03/24/2021 CLINICAL DATA:  Fevers EXAM: PORTABLE CHEST 1 VIEW COMPARISON:  11/20/2018 FINDINGS: Cardiac shadow is enlarged. Postsurgical changes are again seen. Feeding catheter is noted extending into the stomach. Lungs are hypoinflated. Mild vascular congestion is noted without parenchymal edema. Left basilar opacity is noted which may represent early infiltrate. IMPRESSION: Mild CHF without pulmonary edema. Left basilar opacity which may represent an early infiltrate. Electronically Signed   By: Inez Catalina M.D.   On: 03/24/2021 17:54   DG Abd Portable 1V  Result Date: 03/24/2021 CLINICAL DATA:  Feeding tube placement EXAM: PORTABLE ABDOMEN - 1 VIEW COMPARISON:  Portable exam 1314 hours without priors for comparison FINDINGS: Tip of feeding tube projects over distal gastric antrum. Visualized bowel gas pattern normal. Atherosclerotic calcifications aorta and splenic artery. Prior median sternotomy and LEFT atrial  appendage clipping. Subsegmental atelectasis at LEFT lung base. IMPRESSION: Tip of feeding tube projects over distal gastric antrum. Electronically Signed   By: Lavonia Dana M.D.   On: 03/24/2021 13:40   DG Swallowing Func-Speech Pathology  Result Date: 03/29/2021 Formatting of this result is different from the original. Objective Swallowing Evaluation: Type of Study: MBS-Modified Barium Swallow Study  Patient Details Name: Shatika Grinnell MRN: [id number] Date of Birth: 1946/08/28 Today's Date: 03/29/2021 Time: SLP Start Time (ACUTE ONLY): 8 -SLP Stop Time (ACUTE ONLY): 4098 SLP Time Calculation (min) (ACUTE ONLY): 16 min Past Medical History: Past Medical History: Diagnosis Date  Allergy   Asthma   Atrial fibrillation ([place])   Chronic combined systolic and diastolic heart failure ([place]) 05/14/2018  Coronary artery disease   Diverticulitis   Diverticulitis   Hypertension   Pure hypercholesterolemia 05/14/2018  Stented coronary artery  Past Surgical History: Past Surgical History: Procedure Laterality Date  ABDOMINAL HYSTERECTOMY    CARDIAC CATHETERIZATION    CARDIOVERSION N/A 12/21/2014  Procedure: CARDIOVERSION;  Surgeon: Adrian Prows, MD;  Location: [facility];  Service: Cardiovascular;  Laterality: N/A;  CORONARY ANGIOPLASTY    CORONARY ARTERY BYPASS GRAFT   HPI: Micheala Morissette is an 75 y.o. female adm with aphasia - receptive and expressive - Found to have Left MCA CVA and is s/p tPa.   PMH + for atral fibrillation, asthma, chronic combined systolic and diastolic HF, CAD, HTN, hypercholesterolemia and CAD s/p angioplasty and CABG who presents with acute onset of dense receptive and expressive aphasia.  CT showed  Interval development of acute infarct in the left MCA territory with hypodensity  in the left temporal and parietal lobe. This also extends into the left occipital lobe.  CXR 6/17 concerning for edema/Mild CHF, Left basilar opacity which may represent an early infiltrate. Pt has not passed  [place] swallow  screen and has not been ready for po.  Has Cortrak in place at this time.  Has been lethargic with some increased Respiratory demands and per RN, CCM has been consulted.  No data recorded Assessment / Plan / Recommendation CHL IP CLINICAL IMPRESSIONS 03/29/2021 Clinical Impression Pt's oropharyngeal dysphagia is marked by incoordinated oral to pharyngeal transit and poorly timed laryngeal closure leading to mild aspiration. Xerostomia impacted oral transit with cracker and tongue base residue with thicker textures. Using a straw with nectar pt consumed larger sip which entered laryngeal vestibule pre closure, aspirated during swallow (trace) then pt immediately exhaled post swallow blowing bubbles into cup.  Delayed cough was present. Thin penetrated prior to swallow and appeared to spontaneously clear. Most consistencies reached valleculae or pyriform sinuses for >3-5 seconds before swallow onset. Coordination of swallows and respirations was decreased and apniec period perhaps shortened. She is impulsive and thin liquids not recommended at this time. Nectar thick via TEASPOON, puree, pills crushed, full supervision and controlled environment with meals. SLP Visit Diagnosis Dysphagia, oropharyngeal phase (R13.12) Attention and concentration deficit following -- Frontal lobe and executive function deficit following -- Impact on safety and function Moderate aspiration risk   CHL IP TREATMENT RECOMMENDATION 03/29/2021 Treatment Recommendations Therapy as outlined in treatment plan below   Prognosis 03/29/2021 Prognosis for Safe Diet Advancement Good Barriers to Reach Goals -- Barriers/Prognosis Comment -- CHL IP DIET RECOMMENDATION 03/29/2021 SLP Diet Recommendations Dysphagia 1 (Puree) solids;Nectar thick liquid Liquid Administration via Spoon Medication Administration Crushed with puree Compensations Slow rate;Small sips/bites;Clear throat intermittently Postural Changes Seated upright at 90 degrees   CHL IP OTHER  RECOMMENDATIONS 03/29/2021 Recommended Consults -- Oral Care Recommendations Oral care BID Other Recommendations --   CHL IP FOLLOW UP RECOMMENDATIONS 03/29/2021 Follow up Recommendations Inpatient Rehab   CHL IP FREQUENCY AND DURATION 03/29/2021 Speech Therapy Frequency (ACUTE ONLY) min 2x/week Treatment Duration 2 weeks      CHL IP ORAL PHASE 03/29/2021 Oral Phase Impaired Oral - Pudding Teaspoon -- Oral - Pudding Cup -- Oral - Honey Teaspoon -- Oral - Honey Cup Reduced posterior propulsion Oral - Nectar Teaspoon -- Oral - Nectar Cup Reduced posterior propulsion;Lingual/palatal residue Oral - Nectar Straw Reduced posterior propulsion;Lingual/palatal residue Oral - Thin Teaspoon -- Oral - Thin Cup Right anterior bolus loss Oral - Thin Straw -- Oral - Puree Reduced posterior propulsion;Lingual/palatal residue Oral - Mech Soft -- Oral - Regular Reduced posterior propulsion;Other (Comment) Oral - Multi-Consistency -- Oral - Pill -- Oral Phase - Comment --  CHL IP PHARYNGEAL PHASE 03/29/2021 Pharyngeal Phase Impaired Pharyngeal- Pudding Teaspoon -- Pharyngeal -- Pharyngeal- Pudding Cup -- Pharyngeal -- Pharyngeal- Honey Teaspoon -- Pharyngeal -- Pharyngeal- Honey Cup WFL Pharyngeal -- Pharyngeal- Nectar Teaspoon -- Pharyngeal -- Pharyngeal- Nectar Cup Delayed swallow initiation-pyriform sinuses;Pharyngeal residue - valleculae;Inter-arytenoid space residue Pharyngeal -- Pharyngeal- Nectar Straw Penetration/Aspiration before swallow;Delayed swallow initiation-pyriform sinuses Pharyngeal Material enters airway, passes BELOW cords and not ejected out despite cough attempt by patient Pharyngeal- Thin Teaspoon -- Pharyngeal -- Pharyngeal- Thin Cup Penetration/Aspiration before swallow Pharyngeal Material enters airway, remains ABOVE vocal cords then ejected out Pharyngeal- Thin Straw -- Pharyngeal -- Pharyngeal- Puree Delayed swallow initiation-vallecula Pharyngeal -- Pharyngeal- Mechanical Soft -- Pharyngeal -- Pharyngeal-  Regular Pharyngeal residue - valleculae Pharyngeal --  Pharyngeal- Multi-consistency -- Pharyngeal -- Pharyngeal- Pill -- Pharyngeal -- Pharyngeal Comment --  CHL IP CERVICAL ESOPHAGEAL PHASE 03/29/2021 Cervical Esophageal Phase (No Data) Pudding Teaspoon -- Pudding Cup -- Honey Teaspoon -- Honey Cup -- Nectar Teaspoon -- Nectar Cup -- Nectar Straw -- Thin Teaspoon -- Thin Cup -- Thin Straw -- Puree -- Mechanical Soft -- Regular -- Multi-consistency -- Pill -- Cervical Esophageal Comment -- [name] 03/29/2021, 2:53 PM              EEG adult  Result Date: 03/26/2021 Lora Havens, MD     03/26/2021  9:04 PM Patient Name: Roshanda Balazs MRN: [id number] Epilepsy Attending: Lora Havens Referring Physician/Provider: Dr Amie [place] Date: 03/26/2021 Duration: 24.33 mins Patient history: 75yo F with L MCA stroke, continues to be altered. EEG to evaluate for status epilepticus. Level of alertness: lethargic AEDs during EEG study: None Technical aspects: This EEG was obtained using a 10 lead EEG system positioned circumferentially without any parasagittal coverage (rapid EEG). Computer selected EEG is reviewed as well as background features and all clinically significant events.  Description: EEG showed continuous generalized and lateralized left hemisphere polymorphic 3 to 6 Hz theta-delta slowing.  Hyperventilation and photic stimulation were not performed.   ABNORMALITY - Continuous slow, generalized and lateralized left hemisphere IMPRESSION: This study is  suggestive of cortical dysfunction arising from left hemisphere, likely secondary to underlying stroke as well as severe diffuse encephalopathy, nonspecific etiology. No seizures or epileptiform discharges were seen throughout the recording. Dr Rory Percy was notified. Lora Havens   ECHOCARDIOGRAM COMPLETE  Result Date: 03/24/2021    ECHOCARDIOGRAM REPORT   Patient Name:   OTTILIE WIGGLESWORTH Date of Exam: 03/24/2021 Medical Rec #:  [id number]        Height:       63.0 in Accession #:    [id number]      Weight:       203.7 lb Date of Birth:  June 05, 1946      BSA:          1.949 m Patient Age:    75 years        BP:           169/83 mmHg Patient Gender: F               HR:           109 bpm. Exam Location:  Inpatient Procedure: 2D Echo Indications:    stroke  History:        Patient has prior history of Echocardiogram examinations, most                 recent 05/26/2018. CAD, Arrythmias:Atrial Fibrillation; Risk                 Factors:Diabetes and Hypertension.  Sonographer:    Johny Chess Referring Phys: [id number] HUNTER J COLLINS  Sonographer Comments: Suboptimal apical window. Image acquisition challenging due to uncooperative patient, Image acquisition challenging due to patient body habitus and restraints. kept moving. IMPRESSIONS  1. Left ventricular ejection fraction, by estimation, is 60 to 65%. The left ventricle has normal function. The left ventricle has no regional wall motion abnormalities. Left ventricular diastolic function could not be evaluated.  2. Right ventricular systolic function is normal. The right ventricular size is normal.  3. The mitral valve is grossly normal. No evidence of mitral valve regurgitation. No evidence of mitral stenosis.  4. The aortic valve is grossly normal. Aortic  valve regurgitation is mild. No aortic stenosis is present. FINDINGS  Left Ventricle: Left ventricular ejection fraction, by estimation, is 60 to 65%. The left ventricle has normal function. The left ventricle has no regional wall motion abnormalities. The left ventricular internal cavity size was normal in size. There is  borderline left ventricular hypertrophy. Left ventricular diastolic function could not be evaluated due to atrial fibrillation. Left ventricular diastolic function could not be evaluated. Right Ventricle: The right ventricular size is normal. Right vetricular wall thickness was not well visualized. Right ventricular systolic function is  normal. Left Atrium: Left atrial size was normal in size. Right Atrium: Right atrial size was not well visualized. Pericardium: There is no evidence of pericardial effusion. Mitral Valve: The mitral valve is grossly normal. No evidence of mitral valve regurgitation. No evidence of mitral valve stenosis. Tricuspid Valve: The tricuspid valve is grossly normal. Tricuspid valve regurgitation is not demonstrated. Aortic Valve: The aortic valve is grossly normal. Aortic valve regurgitation is mild. No aortic stenosis is present. Pulmonic Valve: The pulmonic valve was grossly normal. Pulmonic valve regurgitation is not visualized. Aorta: The aortic root and ascending aorta are structurally normal, with no evidence of dilitation. IAS/Shunts: The atrial septum is grossly normal.  LEFT VENTRICLE PLAX 2D LVIDd:         4.60 cm LVIDs:         3.40 cm LV PW:         1.10 cm LV IVS:        1.30 cm LVOT diam:     1.90 cm LVOT Area:     2.84 cm  IVC IVC diam: 2.20 cm LEFT ATRIUM           Index LA diam:      4.40 cm 2.26 cm/m LA Vol (A4C): 60.2 ml 30.89 ml/m   AORTA Ao Root diam: 2.80 cm Ao Asc diam:  3.20 cm  SHUNTS Systemic Diam: 1.90 cm Mertie Moores MD Electronically signed by Mertie Moores MD Signature Date/Time: 03/24/2021/5:19:01 PM    Final    CT HEAD CODE STROKE WO CONTRAST  Result Date: 03/23/2021 CLINICAL DATA:  Code stroke.  Acute neuro deficit.  Aphasia EXAM: CT HEAD WITHOUT CONTRAST TECHNIQUE: Contiguous axial images were obtained from the base of the skull through the vertex without intravenous contrast. COMPARISON:  None. FINDINGS: Brain: Mild atrophy and mild white matter hypodensity bilaterally. Negative for acute infarct, hemorrhage, mass. Vascular: Negative for hyperdense vessel Skull: Negative Sinuses/Orbits: Mild mucosal edema paranasal sinuses. Negative orbit Other: None ASPECTS ([place] Stroke Program Early CT Score) - Ganglionic level infarction (caudate, lentiform nuclei, internal capsule, insula,  M1-M3 cortex): 7 - Supraganglionic infarction (M4-M6 cortex): 3 Total score (0-10 with 10 being normal): 10 IMPRESSION: 1. No acute abnormality. 2. ASPECTS is 10 3. Code stroke imaging results were communicated on 03/23/2021 at 6:59 pm to provider [place] via text page Electronically Signed   By: Franchot Gallo M.D.   On: 03/23/2021 19:00   CT ANGIO HEAD CODE STROKE  Result Date: 03/23/2021 CLINICAL DATA:  Expressive aphasia EXAM: CT ANGIOGRAPHY HEAD AND NECK TECHNIQUE: Multidetector CT imaging of the head and neck was performed using the standard protocol during bolus administration of intravenous contrast. Multiplanar CT image reconstructions and MIPs were obtained to evaluate the vascular anatomy. Carotid stenosis measurements (when applicable) are obtained utilizing NASCET criteria, using the distal internal carotid diameter as the denominator. CONTRAST:  33mL OMNIPAQUE IOHEXOL 350 MG/ML SOLN COMPARISON:  None. FINDINGS: CTA NECK  FINDINGS SKELETON: There is no bony spinal canal stenosis. No lytic or blastic lesion. OTHER NECK: Normal pharynx, larynx and major salivary glands. No cervical lymphadenopathy. Unremarkable thyroid gland. UPPER CHEST: No pneumothorax or pleural effusion. No nodules or masses. AORTIC ARCH: There is calcific atherosclerosis of the aortic arch. There is no aneurysm, dissection or hemodynamically significant stenosis of the visualized portion of the aorta. Conventional 3 vessel aortic branching pattern. The visualized proximal subclavian arteries are widely patent. RIGHT CAROTID SYSTEM: No dissection, occlusion or aneurysm. Mild atherosclerotic calcification at the carotid bifurcation without hemodynamically significant stenosis. LEFT CAROTID SYSTEM: No dissection, occlusion or aneurysm. Mild atherosclerotic calcification at the carotid bifurcation without hemodynamically significant stenosis. VERTEBRAL ARTERIES: Left dominant configuration. Both origins are clearly patent. There is no  dissection, occlusion or flow-limiting stenosis to the skull base (V1-V3 segments). CTA HEAD FINDINGS POSTERIOR CIRCULATION: --Vertebral arteries: Normal V4 segments. --Inferior cerebellar arteries: Normal. --Basilar artery: Normal. --Superior cerebellar arteries: Normal. --Posterior cerebral arteries (PCA): Normal. ANTERIOR CIRCULATION: --Intracranial internal carotid arteries: Normal. --Anterior cerebral arteries (ACA): Normal. Both A1 segments are present. Patent anterior communicating artery (a-comm). --Middle cerebral arteries (MCA): Normal. VENOUS SINUSES: As permitted by contrast timing, patent. ANATOMIC VARIANTS: None Review of the MIP images confirms the above findings. IMPRESSION: 1. No emergent large vessel occlusion or high-grade stenosis of the intracranial arteries. 2. Mild bilateral carotid bifurcation atherosclerosis without hemodynamically significant stenosis by NASCET criteria. Aortic Atherosclerosis (ICD10-I70.0). Electronically Signed   By: Ulyses Jarred M.D.   On: 03/23/2021 19:38   CT ANGIO NECK CODE STROKE  Result Date: 03/23/2021 : CLINICAL DATA:   Expressive aphasia EXAM: CT ANGIOGRAPHY HEAD AND NECK TECHNIQUE: Multidetector CT imaging of the head and neck was performed using the standard protocol during bolus administration of intravenous contrast. Multiplanar CT image reconstructions and MIPs were obtained to evaluate the vascular anatomy. Carotid stenosis measurements (when applicable) are obtained utilizing NASCET criteria, using the distal internal carotid diameter as the denominator. CONTRAST:   31mL OMNIPAQUE IOHEXOL 350 MG/ML SOLN COMPARISON:   None. FINDINGS: CTA NECK FINDINGS SKELETON: There is no bony spinal canal stenosis. No lytic or blastic lesion. OTHER NECK: Normal pharynx, larynx and major salivary glands. No cervical lymphadenopathy. Unremarkable thyroid gland. UPPER CHEST: No pneumothorax or pleural effusion. No nodules or masses. AORTIC ARCH: There is calcific  atherosclerosis of the aortic arch. There is no aneurysm, dissection or hemodynamically significant stenosis of the visualized portion of the aorta. Conventional 3 vessel aortic branching pattern. The visualized proximal subclavian arteries are widely patent. RIGHT CAROTID SYSTEM: No dissection, occlusion or aneurysm. Mild atherosclerotic calcification at the carotid bifurcation without hemodynamically significant stenosis. LEFT CAROTID SYSTEM: No dissection, occlusion or aneurysm. Mild atherosclerotic calcification at the carotid bifurcation without hemodynamically significant stenosis. VERTEBRAL ARTERIES: Left dominant configuration. Both origins are clearly patent. There is no dissection, occlusion or flow-limiting stenosis to the skull base (V1-V3 segments). CTA HEAD FINDINGS POSTERIOR CIRCULATION: --Vertebral arteries: Normal V4 segments. --Inferior cerebellar arteries: Normal. --Basilar artery: Normal. --Superior cerebellar arteries: Normal. --Posterior cerebral arteries (PCA): Normal. ANTERIOR CIRCULATION: --Intracranial internal carotid arteries: Normal. --Anterior cerebral arteries (ACA): Normal. Both A1 segments are present. Patent anterior communicating artery (a-comm). --Middle cerebral arteries (MCA): Normal. VENOUS SINUSES: As permitted by contrast timing, patent. ANATOMIC VARIANTS: None Review of the MIP images confirms the above findings. IMPRESSION: 1. No emergent large vessel occlusion or high-grade stenosis of the intracranial arteries. 2. Mild bilateral carotid bifurcation atherosclerosis without hemodynamically significant stenosis by NASCET criteria. Aortic Atherosclerosis (ICD10-I70.0). Electronically  Signed   By: Ulyses Jarred M.D.   On: 03/23/2021 19:45      Subjective: MORE AWAKE TODAY, STILL DIFFICULTY COMMUNICATING, SOME WORD SALAD  Objective: Vitals:   03/30/21 2045 03/31/21 0057 03/31/21 0415 03/31/21 0823  BP: (!) 174/80 (!) 196/122 (!) 157/83   Pulse: 77  85   Resp: 19 17  17    Temp: 98.1 F (36.7 C) (!) 97.5 F (36.4 C) (!) 97.5 F (36.4 C)   TempSrc: Oral Oral Oral   SpO2: 96%  92% 100%  Weight:      Height:        Intake/Output Summary (Last 24 hours) at 03/31/2021 1055 Last data filed at 03/31/2021 0557 Gross per 24 hour  Intake --  Output 1800 ml  Net -1800 ml   Filed Weights   03/25/21 0400 03/28/21 0500 03/29/21 0405  Weight: 90 kg 90 kg 91 kg    Examination:  General exam: Appears calm, still with diff communicating Respiratory system: Clear to auscultation. Respiratory effort normal. Cardiovascular system: S1 & S2 heard, RRR. No JVD, murmurs, rubs, gallops or clicks. No pedal edema. Gastrointestinal system: Abdomen is nondistended, soft and nontender. No organomegaly or masses felt. Normal bowel sounds heard. Central nervous system: Alert-significant improvement from prior, persistent fascial droop limited tracking to left only Extremities:wwp, trace edema Skin: No rashes, lesions or ulcers Psychiatry: Judgement and insight appear normal. Mood & affect appropriate.     Data Reviewed: I have personally reviewed following labs and imaging studies  CBC: Recent Labs  Lab 03/25/21 0610 03/26/21 0300 03/26/21 1849 03/27/21 0424 03/28/21 0240 03/29/21 0410  WBC 17.0* 13.1*  --  14.4* 20.3* 19.0*  HGB 13.8 12.6 11.6* 12.2 12.1 12.5  HCT 41.0 37.9 34.0* 38.0 37.2 39.1  MCV 88.0 90.7  --  93.1 93.0 93.5  PLT 238 181  --  218 245 621   Basic Metabolic Panel: Recent Labs  Lab  0000 03/24/21 1650 03/25/21 0610 03/25/21 0921 03/25/21 1627 03/25/21 2022 03/26/21 0300 03/26/21 0907 03/27/21 0424 03/27/21 1238 03/28/21 0240 03/28/21 0921 03/29/21 0410 03/30/21 0242 03/31/21 0326  NA  --   --  135   < > 137   < > 148*  148*   < > 160*   < > 163* 165* 164* 156* 150*  K   < >  --  3.4*  --   --   --  3.7   < > 3.6  --  3.4*  --  3.5 3.2* 2.9*  CL   < >  --  101  --   --   --  120*  --  129*  --  >130*  --  >130* 123* 114*   CO2   < >  --  23  --   --   --  22  --  20*  --  21*  --  22 25 27   GLUCOSE   < >  --  256*  --   --   --  204*  --  365*  --  188*  --  156* 236* 178*  BUN   < >  --  34*  --   --   --  38*  --  50*  --  56*  --  54* 42* 32*  CREATININE   < >  --  1.28*  --   --   --  1.02*  --  1.45*  --  1.22*  --  0.98 0.90 0.80  CALCIUM   < >  --  9.1  --   --   --  8.4*  --  8.9  --  9.3  --  9.2 8.9 8.9  MG  --  1.8 2.1  --  2.1  --  2.2  --   --   --   --   --   --   --   --   PHOS  --  2.5 3.0  --  3.5  --  3.2  --   --   --   --   --   --   --   --    < > = values in this interval not displayed.   GFR: Estimated Creatinine Clearance: 64 mL/min (by C-G formula based on SCr of 0.8 mg/dL). Liver Function Tests: No results for input(s): AST, ALT, ALKPHOS, BILITOT, PROT, ALBUMIN in the last 168 hours. No results for input(s): LIPASE, AMYLASE in the last 168 hours. No results for input(s): AMMONIA in the last 168 hours. Coagulation Profile: No results for input(s): INR, PROTIME in the last 168 hours. Cardiac Enzymes: No results for input(s): CKTOTAL, CKMB, CKMBINDEX, TROPONINI in the last 168 hours. BNP (last 3 results) No results for input(s): PROBNP in the last 8760 hours. HbA1C: No results for input(s): HGBA1C in the last 72 hours. CBG: Recent Labs  Lab 03/30/21 1611 03/30/21 2006 03/30/21 2324 03/31/21 0358 03/31/21 0804  GLUCAP 358* 295* 190* 155* 134*   Lipid Profile: No results for input(s): CHOL, HDL, LDLCALC, TRIG, CHOLHDL, LDLDIRECT in the last 72 hours. Thyroid Function Tests: No results for input(s): TSH, T4TOTAL, FREET4, T3FREE, THYROIDAB in the last 72 hours. Anemia Panel: No results for input(s): VITAMINB12, FOLATE, FERRITIN, TIBC, IRON, RETICCTPCT in the last 72 hours. Sepsis Labs: No results for input(s): PROCALCITON, LATICACIDVEN in the last 168 hours.  Recent Results (from the past 240 hour(s))  Resp Panel by RT-PCR (Flu A&B, Covid) Nasopharyngeal Swab     Status:  None   Collection Time: 03/23/21  8:20 PM   Specimen: Nasopharyngeal Swab; Nasopharyngeal(NP) swabs in vial transport medium  Result Value Ref Range Status   SARS Coronavirus 2 by RT PCR NEGATIVE NEGATIVE Final    Comment: (NOTE) SARS-CoV-2 target nucleic acids are NOT DETECTED.  The SARS-CoV-2 RNA is generally detectable in upper respiratory specimens during the acute phase of infection. The lowest concentration of SARS-CoV-2 viral copies this assay can detect is 138 copies/mL. A negative result does not preclude SARS-Cov-2 infection and should not be used as the sole basis for treatment or other patient management decisions. A negative result may occur with  improper specimen collection/handling, submission of specimen other than nasopharyngeal swab, presence of viral mutation(s) within the areas targeted by this assay, and inadequate number of viral copies(<138 copies/mL). A negative result must be combined with clinical observations, patient history, and epidemiological information. The expected result is Negative.  Fact Sheet for Patients:  EntrepreneurPulse.com.au  Fact Sheet for Healthcare Providers:  IncredibleEmployment.be  This test is no t yet approved or cleared by the [place] FDA and  has been authorized for detection and/or diagnosis of SARS-CoV-2 by FDA under an Emergency Use Authorization (EUA). This EUA will remain  in effect (meaning this test can be used) for the duration of the COVID-19 declaration under Section 564(b)(1) of the Act, 21 U.S.C.section 360bbb-3(b)(1), unless the authorization is terminated  or revoked sooner.       Influenza A  by PCR NEGATIVE NEGATIVE Final   Influenza B by PCR NEGATIVE NEGATIVE Final    Comment: (NOTE) The Xpert Xpress SARS-CoV-2/FLU/RSV plus assay is intended as an aid in the diagnosis of influenza from Nasopharyngeal swab specimens and should not be used as a sole basis for  treatment. Nasal washings and aspirates are unacceptable for Xpert Xpress SARS-CoV-2/FLU/RSV testing.  Fact Sheet for Patients: EntrepreneurPulse.com.au  Fact Sheet for Healthcare Providers: IncredibleEmployment.be  This test is not yet approved or cleared by the [place] FDA and has been authorized for detection and/or diagnosis of SARS-CoV-2 by FDA under an Emergency Use Authorization (EUA). This EUA will remain in effect (meaning this test can be used) for the duration of the COVID-19 declaration under Section 564(b)(1) of the Act, 21 U.S.C. section 360bbb-3(b)(1), unless the authorization is terminated or revoked.  Performed at [facility], [place] [address], [place], [place] [address]   MRSA PCR Screening     Status: None   Collection Time: 03/23/21  9:33 PM  Result Value Ref Range Status   MRSA by PCR NEGATIVE NEGATIVE Final    Comment:        The GeneXpert MRSA Assay (FDA approved for NASAL specimens only), is one component of a comprehensive MRSA colonization surveillance program. It is not intended to diagnose MRSA infection nor to guide or monitor treatment for MRSA infections. Performed at [facility], [place] [address], [place], [place] [address]   Culture, Urine     Status: None   Collection Time: 03/28/21  8:37 AM   Specimen: Urine, Random  Result Value Ref Range Status   Specimen Description URINE, RANDOM  Final   Special Requests NONE  Final   Culture   Final    NO GROWTH Performed at [facility], [place] [address] [place], [place] [address]    Report Status 03/29/2021 FINAL  Final  Culture, blood (routine x 2)     Status: None (Preliminary result)   Collection Time: 03/28/21  9:20 AM   Specimen: BLOOD RIGHT HAND  Result Value Ref Range Status   Specimen Description BLOOD RIGHT HAND  Final   Special Requests   Final    BOTTLES DRAWN AEROBIC AND ANAEROBIC Blood Culture results may not be optimal  due to an inadequate volume of blood received in culture bottles   Culture   Final    NO GROWTH 2 DAYS Performed at [facility], [place] [address], [place], [place] [address]    Report Status PENDING  Incomplete  Culture, blood (routine x 2)     Status: None (Preliminary result)   Collection Time: 03/28/21  9:22 AM   Specimen: BLOOD RIGHT ARM  Result Value Ref Range Status   Specimen Description BLOOD RIGHT ARM  Final   Special Requests   Final    BOTTLES DRAWN AEROBIC ONLY Blood Culture results may not be optimal due to an inadequate volume of blood received in culture bottles   Culture   Final    NO GROWTH 2 DAYS Performed at [facility], [place] [address] [place], [place] [address]    Report Status PENDING  Incomplete         Radiology Studies: DG Swallowing Func-Speech Pathology  Result Date: 03/29/2021 Formatting of this result is different from the original. 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   PMH + for atral fibrillation, asthma, chronic combined systolic and diastolic HF, CAD, HTN, hypercholesterolemia and CAD s/p angioplasty and CABG who presents with acute onset of dense receptive and expressive aphasia.  CT showed  Interval development of acute infarct in the left MCA territory with hypodensity in the left temporal and parietal lobe. This also extends into the left occipital lobe.  CXR 6/17 concerning for edema/Mild CHF, Left basilar opacity which may represent an early infiltrate. Pt has not passed  [facility] swallow screen and has not been ready for po.  Has Cortrak in place at this time.  Has been lethargic with some increased Respiratory demands and per RN, CCM has been consulted.  No data recorded Assessment / Plan / Recommendation CHL IP CLINICAL IMPRESSIONS 03/29/2021 Clinical Impression Pt's oropharyngeal dysphagia is marked by incoordinated oral to pharyngeal transit and poorly timed laryngeal closure leading to mild aspiration. Xerostomia impacted oral transit with cracker and tongue base residue with thicker textures. Using a straw with nectar pt consumed larger sip which entered laryngeal vestibule pre closure, aspirated during swallow (trace) then pt immediately exhaled post swallow blowing bubbles into cup.  Delayed cough was present. Thin penetrated prior to swallow and appeared to spontaneously clear. Most consistencies reached valleculae or pyriform sinuses for >3-5 seconds before swallow onset. Coordination of swallows and respirations was decreased and apniec period perhaps shortened. She is impulsive and thin liquids not recommended at this time. Nectar thick via TEASPOON, puree, pills crushed, full supervision and controlled environment with meals. SLP Visit Diagnosis Dysphagia, oropharyngeal phase (R13.12) Attention and concentration deficit following -- Frontal lobe and executive function deficit following -- Impact on safety  and function Moderate aspiration risk   CHL IP TREATMENT RECOMMENDATION 03/29/2021 Treatment Recommendations Therapy as outlined in treatment plan below   Prognosis 03/29/2021 Prognosis for Safe Diet Advancement Good Barriers to Reach Goals -- Barriers/Prognosis Comment -- CHL IP DIET RECOMMENDATION 03/29/2021 SLP Diet Recommendations Dysphagia 1 (Puree) solids;Nectar thick liquid Liquid Administration via Spoon Medication Administration Crushed with puree Compensations Slow rate;Small sips/bites;Clear throat intermittently Postural Changes Seated upright at 90 degrees   CHL IP OTHER RECOMMENDATIONS 03/29/2021 Recommended Consults -- Oral Care Recommendations Oral care BID Other Recommendations --   CHL IP FOLLOW UP RECOMMENDATIONS 03/29/2021 Follow up Recommendations Inpatient Rehab   CHL IP FREQUENCY AND DURATION 03/29/2021 Speech Therapy Frequency (ACUTE ONLY) min 2x/week Treatment Duration 2 weeks      CHL IP ORAL PHASE 03/29/2021 Oral Phase Impaired Oral - Pudding Teaspoon -- Oral - Pudding Cup -- Oral - Honey Teaspoon -- Oral - Honey Cup Reduced  posterior propulsion Oral - Nectar Teaspoon -- Oral - Nectar Cup Reduced posterior propulsion;Lingual/palatal residue Oral - Nectar Straw Reduced posterior propulsion;Lingual/palatal residue Oral - Thin Teaspoon -- Oral - Thin Cup Right anterior bolus loss Oral - Thin Straw -- Oral - Puree Reduced posterior propulsion;Lingual/palatal residue Oral - Mech Soft -- Oral - Regular Reduced posterior propulsion;Other (Comment) Oral - Multi-Consistency -- Oral - Pill -- Oral Phase - Comment --  CHL IP PHARYNGEAL PHASE 03/29/2021 Pharyngeal Phase Impaired Pharyngeal- Pudding Teaspoon -- Pharyngeal -- Pharyngeal- Pudding Cup -- Pharyngeal -- Pharyngeal- Honey Teaspoon -- Pharyngeal -- Pharyngeal- Honey Cup WFL Pharyngeal -- Pharyngeal- Nectar Teaspoon -- Pharyngeal -- Pharyngeal- Nectar Cup Delayed swallow initiation-pyriform sinuses;Pharyngeal residue - valleculae;Inter-arytenoid space  residue Pharyngeal -- Pharyngeal- Nectar Straw Penetration/Aspiration before swallow;Delayed swallow initiation-pyriform sinuses Pharyngeal Material enters airway, passes BELOW cords and not ejected out despite cough attempt by patient Pharyngeal- Thin Teaspoon -- Pharyngeal -- Pharyngeal- Thin Cup Penetration/Aspiration before swallow Pharyngeal Material enters airway, remains ABOVE vocal cords then ejected out Pharyngeal- Thin Straw -- Pharyngeal -- Pharyngeal- Puree Delayed swallow initiation-vallecula Pharyngeal -- Pharyngeal- Mechanical Soft -- Pharyngeal -- Pharyngeal- Regular Pharyngeal residue - valleculae Pharyngeal -- Pharyngeal- Multi-consistency -- Pharyngeal -- Pharyngeal- Pill -- Pharyngeal -- Pharyngeal Comment --  CHL IP CERVICAL ESOPHAGEAL PHASE 03/29/2021 Cervical Esophageal Phase (No Data) Pudding Teaspoon -- Pudding Cup -- Honey Teaspoon -- Honey Cup -- Nectar Teaspoon -- Nectar Cup -- Nectar Straw -- Thin Teaspoon -- Thin Cup -- Thin Straw -- Puree -- Mechanical Soft -- Regular -- Multi-consistency -- Pill -- Cervical Esophageal Comment -- [name] 03/29/2021, 2:53 PM                   Scheduled Meds:  carvedilol  25 mg Per Tube BID WC   Chlorhexidine Gluconate Cloth  6 each Topical Daily   diltiazem  60 mg Per Tube Q6H   feeding supplement (PROSource TF)  45 mL Per Tube Daily   free water  200 mL Per Tube Q2H   glimepiride  4 mg Per Tube Q breakfast   hydrALAZINE  10 mg Oral Q8H   insulin aspart  0-20 Units Subcutaneous Q4H   insulin detemir  15 Units Subcutaneous Q24H   ipratropium-albuterol  3 mL Nebulization BID   lisinopril  40 mg Per Tube Daily   mouth rinse  15 mL Mouth Rinse BID   melatonin  5 mg Per Tube QHS   methylPREDNISolone (SOLU-MEDROL) injection  60 mg Intravenous Q12H   pantoprazole sodium  40 mg Per Tube BID   QUEtiapine  25 mg Oral QHS   senna-docusate  1 tablet Per Tube BID   Continuous Infusions:  feeding supplement (JEVITY 1.2 CAL) 1,000  mL (03/29/21 1659)   potassium chloride 10 mEq (03/31/21 1040)     LOS: 8 days    Time spent: 35 min    Nicolette Bang, MD [facility]  If 7PM-7AM, please contact night-coverage  03/31/2021, 10:55 AM

## 2021-03-31 NOTE — Progress Notes (Signed)
Inpatient Rehabilitation Admissions Coordinator   CIR bed is available today. No family at bedside. I have left message for her daughter to call me to discuss a possible CIR admit today.  Danne Baxter, RN, MSN Rehab Admissions Coordinator [phone] 03/31/2021 10:56 AM

## 2021-03-31 NOTE — Care Management (Cosign Needed)
    Durable Medical Equipment  (From admission, onward)           Start     Ordered   03/31/21 1433  For home use only DME Bedside commode  Once       Question:  Patient needs a bedside commode to treat with the following condition  Answer:  Stroke (cerebrum) ([place])   03/31/21 1432   03/31/21 1433  For home use only DME Hospital bed  Once       Question Answer Comment  Length of Need 6 Months   Patient has (list medical condition): stroke   Head must be elevated greater than: 45 degrees   Bed type Semi-electric      03/31/21 1432   03/31/21 1432  For home use only DME Walker rolling  Once       Question Answer Comment  Walker: With [place]   Patient needs a walker to treat with the following condition Stroke (cerebrum) ([place])      03/31/21 1432

## 2021-03-31 NOTE — Progress Notes (Signed)
Physical Therapy Treatment Patient Details Name: Danielle Payne MRN: [id number] DOB: 01-20-1946 Today's Date: 03/31/2021    History of Present Illness 75 yo female presenting 6/16 with acute onset dense receptive and expressive aphasia. tPA given on arrival (6/16) Imaging revealed L MCA infarcts with hypodensity in L occipital, frontal, and parietal lobe. Repeat CT on 6/17 with hemorrhagic conversion in L temporal and parietal lobes. PMH includes: Asthma, Afib, CHF, CAD, HTN, and diverticulitis.    PT Comments    Patient progressing towards physical therapy goals. Patient continues to demo decreased safety awareness, difficulty processing, and impaired communication. Patient required mod-maxA+2 for ambulation to/from bathroom with HHAx2 but no awareness for safety. Patient able to formulate 2-4 word sentences with mobility and able to point to bathroom when presented with "do you need to use the bathroom?". Updated recommendation to HHPT and 24/7 supervision/assistance from 2 people for safety at discharge due to extent of deficits since family declines CIR admission.     Follow Up Recommendations  Home health PT;Supervision/Assistance - 24 hour (x2 people)     Equipment Recommendations  Rolling Hayle Parisi with 5" wheels;3in1 (PT);Wheelchair (measurements PT);Wheelchair cushion (measurements PT);Hospital bed    Recommendations for Other Services       Precautions / Restrictions Precautions Precautions: Fall;Other (comment) Precaution Comments: R hemiplegia, global aphasia Restrictions Weight Bearing Restrictions: No    Mobility  Bed Mobility Overal bed mobility: Needs Assistance Bed Mobility: Supine to Sit;Sit to Supine     Supine to sit: Min assist Sit to supine: Min guard   General bed mobility comments: minA with gestural cueing and assist for initiation to EOB    Transfers Overall transfer level: Needs assistance Equipment used: 2 person hand held assist Transfers: Sit  to/from Stand Sit to Stand: Min assist;+2 physical assistance;Mod assist         General transfer comment: MinA + 2 to rise from edge of bed. Mod A for safe descent to toilet and MIn A for power up into standing from toilet  Ambulation/Gait Ambulation/Gait assistance: Mod assist;Max assist;+2 safety/equipment;+2 physical assistance Gait Distance (Feet): 10 Feet (10') Assistive device: 2 person hand held assist Gait Pattern/deviations: Step-through pattern;Decreased stride length;Trunk flexed;Decreased weight shift to left;Decreased weight shift to right;Decreased step length - right;Wide base of support Gait velocity: decreased   General Gait Details: decreased safety awareness with ambulation. Easily distracted. ModA+2 to maintain balance and guiding due to decreased attention   Stairs             Wheelchair Mobility    Modified Rankin (Stroke Patients Only) Modified Rankin (Stroke Patients Only) Pre-Morbid Rankin Score: No symptoms Modified Rankin: Moderately severe disability     Balance Overall balance assessment: Needs assistance Sitting-balance support: Feet supported;No upper extremity supported Sitting balance-Leahy Scale: Fair   Postural control: Right lateral lean Standing balance support: During functional activity;Bilateral upper extremity supported Standing balance-Leahy Scale: Poor Standing balance comment: Reliant on Mod-Max A for standing balance. Demos R lateral lean with ambulation                            Cognition Arousal/Alertness: Awake/alert Behavior During Therapy: WFL for tasks assessed/performed Overall Cognitive Status: Difficult to assess Area of Impairment: Following commands;Problem solving;Awareness;Safety/judgement;Memory;Attention                   Current Attention Level: Focused;Sustained Memory: Decreased short-term memory Following Commands: Follows one step commands with increased time;Follows one step  commands inconsistently Safety/Judgement: Decreased awareness of safety;Decreased awareness of deficits Awareness: Intellectual Problem Solving: Slow processing;Requires verbal cues;Difficulty sequencing General Comments: Poor awareness and safety. Difficulty processing common task (washing hands following bathroom use) and put soap near mouth.      Exercises      General Comments        Pertinent Vitals/Pain Pain Assessment: Faces Faces Pain Scale: No hurt Pain Intervention(s): Monitored during session    Home Living                      Prior Function            PT Goals (current goals can now be found in the care plan section) Acute Rehab PT Goals Patient Stated Goal: none stated PT Goal Formulation: With family Time For Goal Achievement: 04/10/21 Potential to Achieve Goals: Good Progress towards PT goals: Progressing toward goals    Frequency    Min 4X/week      PT Plan Discharge plan needs to be updated    Co-evaluation              AM-PAC PT "6 Clicks" Mobility   Outcome Measure  Help needed turning from your back to your side while in a flat bed without using bedrails?: A Little Help needed moving from lying on your back to sitting on the side of a flat bed without using bedrails?: A Little Help needed moving to and from a bed to a chair (including a wheelchair)?: Total Help needed standing up from a chair using your arms (e.g., wheelchair or bedside chair)?: Total Help needed to walk in hospital room?: Total Help needed climbing 3-5 steps with a railing? : Total 6 Click Score: 10    End of Session Equipment Utilized During Treatment: Gait belt Activity Tolerance: Patient tolerated treatment well Patient left: in bed;with call bell/phone within reach;with bed alarm set Nurse Communication: Mobility status PT Visit Diagnosis: Other abnormalities of gait and mobility (R26.89);Hemiplegia and hemiparesis Hemiplegia - Right/Left:  Right Hemiplegia - dominant/non-dominant: Dominant Hemiplegia - caused by: Cerebral infarction     Time: 2671-2458 PT Time Calculation (min) (ACUTE ONLY): 23 min  Charges:  $Gait Training: 23-37 mins                     Jguadalupe Opiela A. Gilford Rile PT, DPT Acute Rehabilitation Services Pager [phone] Office [phone]    Linna Hoff 03/31/2021, 5:26 PM

## 2021-03-31 NOTE — Progress Notes (Addendum)
Inpatient Rehabilitation Admissions Coordinator   I met at bedside with patient and her spouse upon his arrival. He states his daughter, Mateo Flow, is unavailable today. I discussed goals and expectations of a possible Cir admit. He states his granddaughter, Valetta Fuller, who I spoke with yesterday, had discussed that with him. He is now making all decisions in regard to his wife. I discussed visitor guidelines and mask requirements for visitation. He states he would like to discuss with his family. I let him know that I would need to know by 3 pm today if he would agree for me to arrange CIR admit today. I will follow up.  Danne Baxter, RN, MSN Rehab Admissions Coordinator [phone] 03/31/2021 1:00 PM  I received call from daughter, Mateo Flow. She states she is not allowed to visit per the mask mandate issues this morning with security. She and family wish to have patient discharge directly home , not CIR. I contacted acute team, Dr Leonie Man and Marianna Fuss, to contact daughter to arrange needed [facility] and DME needed. We will sign off at this time.  Danne Baxter, RN, MSN Rehab Admissions Coordinator [phone] 03/31/2021 1:43 PM  '

## 2021-03-31 NOTE — Care Management Important Message (Signed)
Important Message  Patient Details  Name: Danielle Payne MRN: [id number] Date of Birth: 10/06/1946   Medicare Important Message Given:  Yes - Important Message mailed due to current National Emergency  Verbal consent obtained due to current National Emergency  Relationship to patient: Spouse/Significant Other Contact Name: Mr Oboyle Call Date: 03/31/21  Time: 1251 Phone: [phone] Outcome: Spoke with contact Important Message mailed to: Patient address on file    Delorse Lek 03/31/2021, 12:53 PM

## 2021-03-31 NOTE — Progress Notes (Signed)
Chart reviewed, patient not seen. Telemetry reviewed.  Remains in rate controlled atrial fibrillation. Consider anticoagulation when safe from neurology perspective, [facility] currently being dictated by neuro service given current neuro issues.   Cardiology will sign off.   Cherlynn Kaiser, MD, [place]

## 2021-03-31 NOTE — TOC Initial Note (Signed)
Transition of Care [facility]) - Initial/Assessment Note    Patient Details  Name: Danielle Payne MRN: [id number] Date of Birth: 10/12/1945  Transition of [facility]) CM/SW Contact:    Bethena Roys, RN Phone Number: 03/31/2021, 3:16 PM  Clinical Narrative:  Physical Therapy recommendations were initially for CIR. Case Manager received a message from the Inpatient Rehab Coordinator and the family has declined CIR services. The plan will be to return home with home health services to her daughters home in [place]. Address is [address] [place], [place] [address]. Case Manager offered choice to daughter Mateo Flow via telephone. Medicare.gov list placed in the shadow chart. Mateo Flow is agreeable to [facility] [place] based on star rating in the zip code. Referral made to [facility] with [facility] and start of care to begin within 24-48 hours post transition home. SLP will be added once [facility] obtains staff from [facility] office-daughter is aware. We discussed durable medical equipment (DME) and the daughter has asked for a hospital bed, rolling walker and bedside commode. Referral sent to [facility] and daughter wants all DME delivered to the home. Physician states that the patient will transition home within one-two days. TOC team will continue to follow for additional needs.   Expected Discharge Plan: [place] Barriers to Discharge: Continued Medical Work up   Patient Goals and CMS Choice Patient states their goals for this hospitalization and ongoing recovery are:: to return to her daughters home. CMS Medicare.gov Compare Post Acute Care list provided to:: Patient Represenative (must comment) Choice offered to / list presented to : Patient, Adult Children (Spoke with daughter over the phone and a copy placed in the shadow chart.)  Expected Discharge Plan and Services Expected Discharge Plan: [place]   Discharge Planning Services:  CM Consult Post Acute Care Choice: Durable Medical Equipment, Home Health Living arrangements for the past 2 months: [place] (Plans to transition to her daughters home in [place]) Expected Discharge Date: 03/31/21               DME Arranged: Mobile Hospital bed, Bedside commode (DME to be delivered to the daughters home address.) DME Agency: [facility] Date DME Agency Contacted: 03/31/21 Time DME Agency Contacted: [phone] Representative spoke with at DME Agency: Freda Munro [place]: RN, Disease Management, PT, OT, Nurse's Aide (SLP to be added on per agency has to get staff from [facility].) [place]: [facility] [place] Date [place]: 03/31/21 Time [place]: 1400 Representative spoke with at [place]: Chrissie Noa  Prior Living Arrangements/Services Living arrangements for the past 2 months: [place] (Plans to transition to her daughters home in [place]) Lives with:: Self, Adult Children Patient language and need for interpreter reviewed:: Yes Do you feel safe going back to the place where you live?: Yes      Need for Family Participation in Patient Care: Yes (Comment) Care giver support system in place?: Yes (comment) Current home services: DME Criminal Activity/Legal Involvement Pertinent to Current Situation/Hospitalization: No - Comment as needed  Activities of Daily Living      Permission Sought/Granted Permission sought to share information with : Family Supports, Customer service manager, Case Optician, dispensing granted to share information with : Yes, Verbal Permission Granted     Permission granted to share info w AGENCY: [facility], [facility]        Emotional Assessment Appearance:: Appears stated age Attitude/Demeanor/Rapport: Engaged Affect (  typically observed): Appropriate Orientation: : Oriented to Self, Oriented to Place, Oriented to  Time, Oriented to Situation Alcohol / Substance Use: Not  Applicable Psych Involvement: No (comment)  Admission diagnosis:  Stroke (cerebrum) ([place]) [I63.9] Patient Active Problem List   Diagnosis Date Noted   Stroke (cerebrum) ([place]) 03/23/2021   Class 2 severe obesity due to excess calories with serious comorbidity and body mass index (BMI) of 35.0 to 35.9 in adult ([place]) 12/05/2019   Diabetes mellitus with stage 2 chronic kidney disease ([place]) 12/05/2019   Type 2 diabetes mellitus without complications ([place]) 62/83/6629   Diverticulitis of colon with perforation 04/02/2019   Abdominal pain 03/10/2019   Cellulitis 03/10/2019   Dysphonia 03/10/2019   Chronic combined systolic and diastolic heart failure ([place]) 05/14/2018   Pure hypercholesterolemia 05/14/2018   Atrial fibrillation ([place]) 11/23/2014   Palpitations 11/23/2014   Essential hypertension 11/23/2014   Asthma, chronic 11/23/2014   CAD (coronary artery disease) 11/23/2014   H/o Lyme disease 03/22/2013   PCP:  Glendale Chard, MD Pharmacy:   [facility] [phone] [place], [place] [address] [place] [address] Phone: [phone] Fax: [phone]  [facility] [place] [id number] [place], [place] - [address] [place] [address] Phone: [phone] Fax: [place], [place] [place] [address] Phone: [phone] Fax: [phone]   Readmission Risk Interventions No flowsheet data found.

## 2021-03-31 NOTE — Care Management Important Message (Signed)
Important Message  Patient Details  Name: Danielle Payne MRN: [id number] Date of Birth: 02-28-1946   Medicare Important Message Given:  Yes     Rabia Argote [address] 03/31/2021, 2:57 PM

## 2021-03-31 NOTE — Discharge Summary (Deleted)
Stroke Discharge Summary  Patient ID: Danielle Payne   MRN: [id number]      DOB: Sep 10, 1946  Date of Admission: 03/23/2021 Date of Discharge: 03/31/2021  Attending Physician:  Stroke, Md, MD, Stroke MD Consultant(s):   CCM, Hospitalists Patient's PCP:  Glendale Chard, MD  Discharge Diagnoses: dysphasia, global aphasia.  Active Problems:   Atrial fibrillation (HCC)   Stroke (cerebrum) (HCC)   Medications to be continued on Rehab Allergies as of 03/31/2021   No Known Allergies      Medication List     STOP taking these medications    diltiazem 180 MG 24 hr capsule Commonly known as: Cardizem CD   furosemide 40 MG tablet Commonly known as: LASIX   loratadine 10 MG tablet Commonly known as: CLARITIN   NON FORMULARY   NON FORMULARY   PROBIOTIC PO   Protease Powd       TAKE these medications    acetaminophen 325 MG tablet Commonly known as: TYLENOL Take 2 tablets (650 mg total) by mouth every 4 (four) hours as needed for mild pain (or temp > 37.5 C (99.5 F)).   albuterol 108 (90 Base) MCG/ACT inhaler Commonly known as: VENTOLIN HFA INHALE TWO PUFFS INTO THE LUNGS EVERY 4 HOURS AS NEEDED FOR WHEEZING OR SHORTNESS OF BREATH What changed:  how much to take how to take this when to take this reasons to take this additional instructions   carvedilol 25 MG tablet Commonly known as: COREG Take 1 tablet (25 mg total) by mouth 2 (two) times daily with a meal.   diltiazem 10 mg/ml  oral suspension Commonly known as: CARDIZEM Place 6 mLs (60 mg total) into feeding tube every 6 (six) hours.   food thickener Liqd Commonly known as: SIMPLYTHICK (HONEY/LEVEL 3/MODERATELY THICK) Take 1 packet by mouth as needed.   glimepiride 4 MG tablet Commonly known as: AMARYL Take 4 mg by mouth daily with breakfast.   hydrALAZINE 10 MG tablet Commonly known as: APRESOLINE Take 1 tablet (10 mg total) by mouth every 8 (eight) hours.   insulin aspart 100 UNIT/ML  injection Commonly known as: novoLOG Inject 0-20 Units into the skin every 4 (four) hours.   insulin detemir 100 UNIT/ML injection Commonly known as: LEVEMIR Inject 0.15 mLs (15 Units total) into the skin daily.   lisinopril 20 MG tablet Commonly known as: ZESTRIL Place 2 tablets (40 mg total) into feeding tube daily. Start taking on: April 01, 2021   Melatonin 5 MG Tbdp Take 5 mg by mouth at bedtime.   metoprolol tartrate 5 MG/5ML Soln injection Commonly known as: LOPRESSOR Inject 5 mLs (5 mg total) into the vein every 6 (six) hours as needed (HR > 120).   pantoprazole sodium 40 mg/20 mL Pack Commonly known as: PROTONIX Place 20 mLs (40 mg total) into feeding tube 2 (two) times daily.   potassium chloride 10 MEQ/100ML Inject 100 mLs (10 mEq total) into the vein every 1 hour x 4 doses.   QUEtiapine 25 MG tablet Commonly known as: SEROQUEL Take 1 tablet (25 mg total) by mouth at bedtime.        LABORATORY STUDIES CBC    Component Value Date/Time   WBC 19.0 (H) 03/29/2021 0410   RBC 4.18 03/29/2021 0410   HGB 12.5 03/29/2021 0410   HGB 14.4 05/15/2018 0827   HCT 39.1 03/29/2021 0410   HCT 42.9 05/15/2018 0827   PLT 238 03/29/2021 0410   PLT 258 05/15/2018 0827  MCV 93.5 03/29/2021 0410   MCV 89 05/15/2018 0827   MCH 29.9 03/29/2021 0410   MCHC 32.0 03/29/2021 0410   RDW 14.6 03/29/2021 0410   RDW 14.9 05/15/2018 0827   LYMPHSABS 2.9 03/23/2021 1847   LYMPHSABS 2.5 05/15/2018 0827   MONOABS 0.8 03/23/2021 1847   EOSABS 0.2 03/23/2021 1847   EOSABS 0.2 05/15/2018 0827   BASOSABS 0.1 03/23/2021 1847   BASOSABS 0.0 05/15/2018 0827   CMP    Component Value Date/Time   NA 150 (H) 03/31/2021 0326   NA 138 03/04/2020 0000   K 2.9 (L) 03/31/2021 0326   CL 114 (H) 03/31/2021 0326   CO2 27 03/31/2021 0326   GLUCOSE 178 (H) 03/31/2021 0326   BUN 32 (H) 03/31/2021 0326   BUN 24 (A) 03/04/2020 0000   CREATININE 0.80 03/31/2021 0326   CREATININE 0.88 04/13/2019  1511   CALCIUM 8.9 03/31/2021 0326   PROT 7.4 03/23/2021 1847   PROT 7.4 11/05/2019 0902   ALBUMIN 3.8 03/23/2021 1847   ALBUMIN 4.3 11/05/2019 0902   AST 24 03/23/2021 1847   ALT 23 03/23/2021 1847   ALKPHOS 88 03/23/2021 1847   BILITOT 0.6 03/23/2021 1847   BILITOT 0.6 11/05/2019 0902   GFRNONAA >60 03/31/2021 0326   GFRAA 57 (L) 11/05/2019 0902   COAGS Lab Results  Component Value Date   INR 1.1 03/23/2021   INR 2.5 09/08/2019   INR 2.6 08/10/2019   Lipid Panel    Component Value Date/Time   CHOL 126 03/24/2021 0510   CHOL 191 11/05/2019 0902   TRIG 1,219 (H) 03/24/2021 0510   HDL 27 (L) 03/24/2021 0510   HDL 39 (L) 11/05/2019 0902   CHOLHDL NOT REPORTED DUE TO HIGH TRIGLYCERIDES 03/24/2021 0510   VLDL UNABLE TO CALCULATE IF TRIGLYCERIDE OVER 400 mg/dL 03/24/2021 0510   LDLCALC UNABLE TO CALCULATE IF TRIGLYCERIDE OVER 400 mg/dL 03/24/2021 0510   LDLCALC 119 (H) 11/05/2019 0902   HgbA1C  Lab Results  Component Value Date   HGBA1C 8.8 (H) 03/24/2021   Urinalysis    Component Value Date/Time   COLORURINE YELLOW 03/28/2021 0837   APPEARANCEUR HAZY (A) 03/28/2021 0837   LABSPEC 1.024 03/28/2021 0837   PHURINE 5.0 03/28/2021 0837   GLUCOSEU 50 (A) 03/28/2021 0837   HGBUR NEGATIVE 03/28/2021 0837   BILIRUBINUR NEGATIVE 03/28/2021 0837   BILIRUBINUR Negative 11/18/2019 1252   KETONESUR 5 (A) 03/28/2021 0837   PROTEINUR NEGATIVE 03/28/2021 0837   UROBILINOGEN 0.2 11/18/2019 1252   NITRITE NEGATIVE 03/28/2021 0837   LEUKOCYTESUR NEGATIVE 03/28/2021 0837   Urine Drug Screen     Component Value Date/Time   LABOPIA NONE DETECTED 03/24/2021 0126   COCAINSCRNUR NONE DETECTED 03/24/2021 0126   LABBENZ NONE DETECTED 03/24/2021 0126   AMPHETMU NONE DETECTED 03/24/2021 0126   THCU NONE DETECTED 03/24/2021 0126   LABBARB NONE DETECTED 03/24/2021 0126    Alcohol Level    Component Value Date/Time   ETH <10 03/23/2021 1847     SIGNIFICANT DIAGNOSTIC STUDIES CT  Head Wo Contrast  Result Date: 03/28/2021 CLINICAL DATA:  Stroke, follow-up EXAM: CT HEAD WITHOUT CONTRAST TECHNIQUE: Contiguous axial images were obtained from the base of the skull through the vertex without intravenous contrast. COMPARISON:  03/26/2021 FINDINGS: Brain: Areas of evolving recent infarction with hemorrhagic conversion again identified in the left frontal, parietal, and lateral occipital lobes. Similar associated mass effect remains mild. Unchanged small left temporal, likely subarachnoid hemorrhage. No new hemorrhage or loss of  gray-white differentiation. Ventricles are stable in size. Vascular: No new findings. Skull: Calvarium is unremarkable. Sinuses/Orbits: No acute finding. Other: None. 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 Unchanged small left temporal, likely subarachnoid hemorrhage. No new hemorrhage or worsening mass effect. Electronically Signed   By: Macy Mis M.D.   On: 03/28/2021 10:43   CT HEAD WO CONTRAST  Result Date: 03/26/2021 CLINICAL DATA:  Stroke follow-up EXAM: CT HEAD WITHOUT CONTRAST TECHNIQUE: Contiguous axial images were obtained from the base of the skull through the vertex without intravenous contrast. COMPARISON:  CT head 03/25/2021 FINDINGS: Brain: Acute infarct in the left middle frontal lobe unchanged with minimal hemorrhage, unchanged. Acute infarct left parietal lobe with hemorrhagic transformation unchanged from yesterday. Infarct also extends into the left occipital lobe hemorrhagic transformation unchanged. Small amount of hemorrhage in the left temporal lobe likely subarachnoid hemorrhage is unchanged. Slight midline shift to the right unchanged. Negative for hydrocephalus. No new area of infarct. Vascular: Negative for hyperdense vessel Skull: Negative Sinuses/Orbits: Mucosal edema paranasal sinuses.  Negative orbit Other: None IMPRESSION: No change from yesterday. Acute infarct in the left MCA and left PCA  territory with hemorrhagic transformation in the left occipital and parietal lobes. Acute infarct left middle frontal gyrus with minimal hemorrhage, unchanged. No new area of hemorrhage or infarct. Electronically Signed   By: Franchot Gallo M.D.   On: 03/26/2021 18:47   CT HEAD WO CONTRAST  Result Date: 03/25/2021 CLINICAL DATA:  75 year old female code stroke presentation status post IV tPA. Left MCA territory infarct with mild hemorrhagic changes. EXAM: CT HEAD WITHOUT CONTRAST TECHNIQUE: Contiguous axial images were obtained from the base of the skull through the vertex without intravenous contrast. COMPARISON:  Head CT 03/24/2021 and earlier. FINDINGS: Brain: Mixed cytotoxic edema and rounded, nodular areas of hemorrhage redemonstrated in the left MCA territory. The several dominant foci of blood in the posterior left frontal and left parietal lobes continuing toward the left occipital pole are stable and size and configuration since yesterday (e.g. Series 4, image 22). Partial effacement of the left lateral ventricle and 2-3 mm of rightward midline shift are stable. No intraventricular hemorrhage. Probable gyral petechial hemorrhage at the left temporal operculum is stable on series 4, image 18. No definite extra-axial hemorrhage. Right hemisphere and posterior fossa gray-white matter differentiation is stable and within normal limits. No ventriculomegaly. Basilar cisterns remain patent. Vascular: Calcified atherosclerosis at the skull base. Skull: No acute osseous abnormality identified. Sinuses/Orbits: Visualized paranasal sinuses and mastoids are stable and well aerated. Other: Stable right nasoenteric tube. Mild leftward gaze is stable. Visualized scalp soft tissues are within normal limits. IMPRESSION: 1. Stable Left MCA infarct with hemorrhagic transformation since yesterday. Extension to the Left MCA/PCA watershed territory as before. Partially effaced left lateral ventricle and trace rightward  midline shift are stable. No extra-axial extension of blood. 2. No new areas of involvement or new intracranial abnormality. Electronically Signed   By: Genevie Ann M.D.   On: 03/25/2021 05:19   CT HEAD WO CONTRAST  Result Date: 03/24/2021 CLINICAL DATA:  Stroke.  24 hours post tPA EXAM: CT HEAD WITHOUT CONTRAST TECHNIQUE: Contiguous axial images were obtained from the base of the skull through the vertex without intravenous contrast. COMPARISON:  CT head 03/23/2021 FINDINGS: Brain: Interval development of left MCA infarct with hypodensity in the left frontal and parietal lobe. There is also involvement of the left occipital lobe. There is hemorrhagic transformation of the infarct involving the left temporal and parietal  lobe with a moderate amount of blood present. Ventricle size normal.  No midline shift. Vascular: Negative for hyperdense vessel Skull: Negative Sinuses/Orbits: Mucosal edema paranasal sinuses. Feeding tube in place. Air-fluid level sphenoid sinus. Negative orbit. Other: None IMPRESSION: Interval development of acute infarct in the left MCA territory with hypodensity in the left temporal and parietal lobe. This also extends into the left occipital lobe. There has been interval hemorrhagic transformation of the infarct with moderate amount of blood present within the infarction. Dr. Cheral Marker  has been paged to discuss the results. Electronically Signed   By: Franchot Gallo M.D.   On: 03/24/2021 18:30   DG CHEST PORT 1 VIEW  Result Date: 03/28/2021 CLINICAL DATA:  Hypoxia.  Stroke. EXAM: PORTABLE CHEST 1 VIEW COMPARISON:  03/26/2021. 11/20/2018. FINDINGS: Feeding tube noted with tip below left hemidiaphragm. Cardiomegaly again noted. Left atrial appendage clip in stable position. Pulmonary venous congestion. Bilateral interstitial prominence again noted. Findings suggest CHF. Low lung volumes. Small left pleural effusion may be present. No pneumothorax. Surgical clips left upper quadrant.  IMPRESSION: 1.  Feeding tube noted with tip below left hemidiaphragm. 2. Cardiomegaly. Left atrial appendage clip in stable position. Bilateral interstitial prominence. Findings suggest congestive heart failure with interstitial edema. Small left pleural effusion may be present. Electronically Signed   By: Marcello Moores  Register   On: 03/28/2021 11:13   DG CHEST PORT 1 VIEW  Result Date: 03/26/2021 CLINICAL DATA:  Respiratory distress. EXAM: PORTABLE CHEST 1 VIEW COMPARISON:  Chest radiograph 03/24/2021. FINDINGS: Enteric tube courses inferior to the diaphragm. Stable cardiomegaly status post median sternotomy. Bilateral interstitial pulmonary opacities. No pleural effusion or pneumothorax. Thoracic spine degenerative changes. IMPRESSION: Cardiomegaly with mild interstitial opacities suggestive of edema. Electronically Signed   By: Lovey Newcomer M.D.   On: 03/26/2021 16:56   DG CHEST PORT 1 VIEW  Result Date: 03/24/2021 CLINICAL DATA:  Fevers EXAM: PORTABLE CHEST 1 VIEW COMPARISON:  11/20/2018 FINDINGS: Cardiac shadow is enlarged. Postsurgical changes are again seen. Feeding catheter is noted extending into the stomach. Lungs are hypoinflated. Mild vascular congestion is noted without parenchymal edema. Left basilar opacity is noted which may represent early infiltrate. IMPRESSION: Mild CHF without pulmonary edema. Left basilar opacity which may represent an early infiltrate. Electronically Signed   By: Inez Catalina M.D.   On: 03/24/2021 17:54   DG Abd Portable 1V  Result Date: 03/24/2021 CLINICAL DATA:  Feeding tube placement EXAM: PORTABLE ABDOMEN - 1 VIEW COMPARISON:  Portable exam 1314 hours without priors for comparison FINDINGS: Tip of feeding tube projects over distal gastric antrum. Visualized bowel gas pattern normal. Atherosclerotic calcifications aorta and splenic artery. Prior median sternotomy and LEFT atrial appendage clipping. Subsegmental atelectasis at LEFT lung base. IMPRESSION: Tip of feeding  tube projects over distal gastric antrum. Electronically Signed   By: Lavonia Dana M.D.   On: 03/24/2021 13:40   DG Swallowing Func-Speech Pathology  Result Date: 03/29/2021 Formatting of this result is different from the original. Objective Swallowing Evaluation: Type of Study: MBS-Modified Barium Swallow Study  Patient Details Name: Cobi Aldape MRN: [id number] Date of Birth: October 14, 1945 Today's Date: 03/29/2021 Time: SLP Start Time (ACUTE ONLY): 60 -SLP Stop Time (ACUTE ONLY): 1323 SLP Time Calculation (min) (ACUTE ONLY): 16 min Past Medical History: Past Medical History: Diagnosis Date  Allergy   Asthma   Atrial fibrillation ([place])   Chronic combined systolic and diastolic heart failure ([place]) 05/14/2018  Coronary artery disease   Diverticulitis   Diverticulitis   Hypertension  Pure hypercholesterolemia 05/14/2018  Stented coronary artery  Past Surgical History: Past Surgical History: Procedure Laterality Date  ABDOMINAL HYSTERECTOMY    CARDIAC CATHETERIZATION    CARDIOVERSION N/A 12/21/2014  Procedure: CARDIOVERSION;  Surgeon: Adrian Prows, MD;  Location: [facility] ENDOSCOPY;  Service: Cardiovascular;  Laterality: N/A;  CORONARY ANGIOPLASTY    CORONARY ARTERY BYPASS GRAFT   HPI: Uri Covey is an 75 y.o. female adm with aphasia - receptive and expressive - Found to have Left MCA CVA and is s/p tPa.   PMH + for atral fibrillation, asthma, chronic combined systolic and diastolic HF, CAD, HTN, hypercholesterolemia and CAD s/p angioplasty and CABG who presents with acute onset of dense receptive and expressive aphasia.  CT showed  Interval development of acute infarct in the left MCA territory with hypodensity in the left temporal and parietal lobe. This also extends into the left occipital lobe.  CXR 6/17 concerning for edema/Mild CHF, Left basilar opacity which may represent an early infiltrate. Pt has not passed  [place] swallow screen and has not been ready for po.  Has Cortrak in place at this time.  Has been lethargic  with some increased Respiratory demands and per RN, CCM has been consulted.  No data recorded Assessment / Plan / Recommendation CHL IP CLINICAL IMPRESSIONS 03/29/2021 Clinical Impression Pt's oropharyngeal dysphagia is marked by incoordinated oral to pharyngeal transit and poorly timed laryngeal closure leading to mild aspiration. Xerostomia impacted oral transit with cracker and tongue base residue with thicker textures. Using a straw with nectar pt consumed larger sip which entered laryngeal vestibule pre closure, aspirated during swallow (trace) then pt immediately exhaled post swallow blowing bubbles into cup.  Delayed cough was present. Thin penetrated prior to swallow and appeared to spontaneously clear. Most consistencies reached valleculae or pyriform sinuses for >3-5 seconds before swallow onset. Coordination of swallows and respirations was decreased and apniec period perhaps shortened. She is impulsive and thin liquids not recommended at this time. Nectar thick via TEASPOON, puree, pills crushed, full supervision and controlled environment with meals. SLP Visit Diagnosis Dysphagia, oropharyngeal phase (R13.12) Attention and concentration deficit following -- Frontal lobe and executive function deficit following -- Impact on safety and function Moderate aspiration risk   CHL IP TREATMENT RECOMMENDATION 03/29/2021 Treatment Recommendations Therapy as outlined in treatment plan below   Prognosis 03/29/2021 Prognosis for Safe Diet Advancement Good Barriers to Reach Goals -- Barriers/Prognosis Comment -- CHL IP DIET RECOMMENDATION 03/29/2021 SLP Diet Recommendations Dysphagia 1 (Puree) solids;Nectar thick liquid Liquid Administration via Spoon Medication Administration Crushed with puree Compensations Slow rate;Small sips/bites;Clear throat intermittently Postural Changes Seated upright at 90 degrees   CHL IP OTHER RECOMMENDATIONS 03/29/2021 Recommended Consults -- Oral Care Recommendations Oral care BID Other  Recommendations --   CHL IP FOLLOW UP RECOMMENDATIONS 03/29/2021 Follow up Recommendations Inpatient Rehab   CHL IP FREQUENCY AND DURATION 03/29/2021 Speech Therapy Frequency (ACUTE ONLY) min 2x/week Treatment Duration 2 weeks      CHL IP ORAL PHASE 03/29/2021 Oral Phase Impaired Oral - Pudding Teaspoon -- Oral - Pudding Cup -- Oral - Honey Teaspoon -- Oral - Honey Cup Reduced posterior propulsion Oral - Nectar Teaspoon -- Oral - Nectar Cup Reduced posterior propulsion;Lingual/palatal residue Oral - Nectar Straw Reduced posterior propulsion;Lingual/palatal residue Oral - Thin Teaspoon -- Oral - Thin Cup Right anterior bolus loss Oral - Thin Straw -- Oral - Puree Reduced posterior propulsion;Lingual/palatal residue Oral - Mech Soft -- Oral - Regular Reduced posterior propulsion;Other (Comment) Oral - Multi-Consistency -- Oral -  Pill -- Oral Phase - Comment --  CHL IP PHARYNGEAL PHASE 03/29/2021 Pharyngeal Phase Impaired Pharyngeal- Pudding Teaspoon -- Pharyngeal -- Pharyngeal- Pudding Cup -- Pharyngeal -- Pharyngeal- Honey Teaspoon -- Pharyngeal -- Pharyngeal- Honey Cup WFL Pharyngeal -- Pharyngeal- Nectar Teaspoon -- Pharyngeal -- Pharyngeal- Nectar Cup Delayed swallow initiation-pyriform sinuses;Pharyngeal residue - valleculae;Inter-arytenoid space residue Pharyngeal -- Pharyngeal- Nectar Straw Penetration/Aspiration before swallow;Delayed swallow initiation-pyriform sinuses Pharyngeal Material enters airway, passes BELOW cords and not ejected out despite cough attempt by patient Pharyngeal- Thin Teaspoon -- Pharyngeal -- Pharyngeal- Thin Cup Penetration/Aspiration before swallow Pharyngeal Material enters airway, remains ABOVE vocal cords then ejected out Pharyngeal- Thin Straw -- Pharyngeal -- Pharyngeal- Puree Delayed swallow initiation-vallecula Pharyngeal -- Pharyngeal- Mechanical Soft -- Pharyngeal -- Pharyngeal- Regular Pharyngeal residue - valleculae Pharyngeal -- Pharyngeal- Multi-consistency -- Pharyngeal --  Pharyngeal- Pill -- Pharyngeal -- Pharyngeal Comment --  CHL IP CERVICAL ESOPHAGEAL PHASE 03/29/2021 Cervical Esophageal Phase (No Data) Pudding Teaspoon -- Pudding Cup -- Honey Teaspoon -- Honey Cup -- Nectar Teaspoon -- Nectar Cup -- Nectar Straw -- Thin Teaspoon -- Thin Cup -- Thin Straw -- Puree -- Mechanical Soft -- Regular -- Multi-consistency -- Pill -- Cervical Esophageal Comment -- [name] 03/29/2021, 2:53 PM              EEG adult  Result Date: 03/26/2021 Lora Havens, MD     03/26/2021  9:04 PM Patient Name: Nyara Capell MRN: [id number] Epilepsy Attending: Lora Havens Referring Physician/Provider: Dr Amie [place] Date: 03/26/2021 Duration: 24.33 mins Patient history: 75yo F with L MCA stroke, continues to be altered. EEG to evaluate for status epilepticus. Level of alertness: lethargic AEDs during EEG study: None Technical aspects: This EEG was obtained using a 10 lead EEG system positioned circumferentially without any parasagittal coverage (rapid EEG). Computer selected EEG is reviewed as well as background features and all clinically significant events.  Description: EEG showed continuous generalized and lateralized left hemisphere polymorphic 3 to 6 Hz theta-delta slowing.  Hyperventilation and photic stimulation were not performed.   ABNORMALITY - Continuous slow, generalized and lateralized left hemisphere IMPRESSION: This study is  suggestive of cortical dysfunction arising from left hemisphere, likely secondary to underlying stroke as well as severe diffuse encephalopathy, nonspecific etiology. No seizures or epileptiform discharges were seen throughout the recording. Dr Rory Percy was notified. Lora Havens   ECHOCARDIOGRAM COMPLETE  Result Date: 03/24/2021    ECHOCARDIOGRAM REPORT   Patient Name:   DERINDA BARTUS Date of Exam: 03/24/2021 Medical Rec #:  [id number]       Height:       63.0 in Accession #:    [id number]      Weight:       203.7 lb Date of Birth:   1945/11/27      BSA:          1.949 m Patient Age:    75 years        BP:           169/83 mmHg Patient Gender: F               HR:           109 bpm. Exam Location:  Inpatient Procedure: 2D Echo Indications:    stroke  History:        Patient has prior history of Echocardiogram examinations, most                 recent 05/26/2018. CAD, Arrythmias:Atrial Fibrillation;  Risk                 Factors:Diabetes and Hypertension.  Sonographer:    Johny Chess Referring Phys: [id number] HUNTER J COLLINS  Sonographer Comments: Suboptimal apical window. Image acquisition challenging due to uncooperative patient, Image acquisition challenging due to patient body habitus and restraints. kept moving. IMPRESSIONS  1. Left ventricular ejection fraction, by estimation, is 60 to 65%. The left ventricle has normal function. The left ventricle has no regional wall motion abnormalities. Left ventricular diastolic function could not be evaluated.  2. Right ventricular systolic function is normal. The right ventricular size is normal.  3. The mitral valve is grossly normal. No evidence of mitral valve regurgitation. No evidence of mitral stenosis.  4. The aortic valve is grossly normal. Aortic valve regurgitation is mild. No aortic stenosis is present. FINDINGS  Left Ventricle: Left ventricular ejection fraction, by estimation, is 60 to 65%. The left ventricle has normal function. The left ventricle has no regional wall motion abnormalities. The left ventricular internal cavity size was normal in size. There is  borderline left ventricular hypertrophy. Left ventricular diastolic function could not be evaluated due to atrial fibrillation. Left ventricular diastolic function could not be evaluated. Right Ventricle: The right ventricular size is normal. Right vetricular wall thickness was not well visualized. Right ventricular systolic function is normal. Left Atrium: Left atrial size was normal in size. Right Atrium: Right atrial size  was not well visualized. Pericardium: There is no evidence of pericardial effusion. Mitral Valve: The mitral valve is grossly normal. No evidence of mitral valve regurgitation. No evidence of mitral valve stenosis. Tricuspid Valve: The tricuspid valve is grossly normal. Tricuspid valve regurgitation is not demonstrated. Aortic Valve: The aortic valve is grossly normal. Aortic valve regurgitation is mild. No aortic stenosis is present. Pulmonic Valve: The pulmonic valve was grossly normal. Pulmonic valve regurgitation is not visualized. Aorta: The aortic root and ascending aorta are structurally normal, with no evidence of dilitation. IAS/Shunts: The atrial septum is grossly normal.  LEFT VENTRICLE PLAX 2D LVIDd:         4.60 cm LVIDs:         3.40 cm LV PW:         1.10 cm LV IVS:        1.30 cm LVOT diam:     1.90 cm LVOT Area:     2.84 cm  IVC IVC diam: 2.20 cm LEFT ATRIUM           Index LA diam:      4.40 cm 2.26 cm/m LA Vol (A4C): 60.2 ml 30.89 ml/m   AORTA Ao Root diam: 2.80 cm Ao Asc diam:  3.20 cm  SHUNTS Systemic Diam: 1.90 cm Mertie Moores MD Electronically signed by Mertie Moores MD Signature Date/Time: 03/24/2021/5:19:01 PM    Final    CT HEAD CODE STROKE WO CONTRAST  Result Date: 03/23/2021 CLINICAL DATA:  Code stroke.  Acute neuro deficit.  Aphasia EXAM: CT HEAD WITHOUT CONTRAST TECHNIQUE: Contiguous axial images were obtained from the base of the skull through the vertex without intravenous contrast. COMPARISON:  None. FINDINGS: Brain: Mild atrophy and mild white matter hypodensity bilaterally. Negative for acute infarct, hemorrhage, mass. Vascular: Negative for hyperdense vessel Skull: Negative Sinuses/Orbits: Mild mucosal edema paranasal sinuses. Negative orbit Other: None ASPECTS ([place] Stroke Program Early CT Score) - Ganglionic level infarction (caudate, lentiform nuclei, internal capsule, insula, M1-M3 cortex): 7 - Supraganglionic infarction (M4-M6 cortex): 3 Total score (0-10  with 10  being normal): 10 IMPRESSION: 1. No acute abnormality. 2. ASPECTS is 10 3. Code stroke imaging results were communicated on 03/23/2021 at 6:59 pm to provider [place] via text page Electronically Signed   By: Franchot Gallo M.D.   On: 03/23/2021 19:00   CT ANGIO HEAD CODE STROKE  Result Date: 03/23/2021 CLINICAL DATA:  Expressive aphasia EXAM: CT ANGIOGRAPHY HEAD AND NECK TECHNIQUE: Multidetector CT imaging of the head and neck was performed using the standard protocol during bolus administration of intravenous contrast. Multiplanar CT image reconstructions and MIPs were obtained to evaluate the vascular anatomy. Carotid stenosis measurements (when applicable) are obtained utilizing NASCET criteria, using the distal internal carotid diameter as the denominator. CONTRAST:  48mL OMNIPAQUE IOHEXOL 350 MG/ML SOLN COMPARISON:  None. FINDINGS: CTA NECK FINDINGS SKELETON: There is no bony spinal canal stenosis. No lytic or blastic lesion. OTHER NECK: Normal pharynx, larynx and major salivary glands. No cervical lymphadenopathy. Unremarkable thyroid gland. UPPER CHEST: No pneumothorax or pleural effusion. No nodules or masses. AORTIC ARCH: There is calcific atherosclerosis of the aortic arch. There is no aneurysm, dissection or hemodynamically significant stenosis of the visualized portion of the aorta. Conventional 3 vessel aortic branching pattern. The visualized proximal subclavian arteries are widely patent. RIGHT CAROTID SYSTEM: No dissection, occlusion or aneurysm. Mild atherosclerotic calcification at the carotid bifurcation without hemodynamically significant stenosis. LEFT CAROTID SYSTEM: No dissection, occlusion or aneurysm. Mild atherosclerotic calcification at the carotid bifurcation without hemodynamically significant stenosis. VERTEBRAL ARTERIES: Left dominant configuration. Both origins are clearly patent. There is no dissection, occlusion or flow-limiting stenosis to the skull base (V1-V3 segments). CTA  HEAD FINDINGS POSTERIOR CIRCULATION: --Vertebral arteries: Normal V4 segments. --Inferior cerebellar arteries: Normal. --Basilar artery: Normal. --Superior cerebellar arteries: Normal. --Posterior cerebral arteries (PCA): Normal. ANTERIOR CIRCULATION: --Intracranial internal carotid arteries: Normal. --Anterior cerebral arteries (ACA): Normal. Both A1 segments are present. Patent anterior communicating artery (a-comm). --Middle cerebral arteries (MCA): Normal. VENOUS SINUSES: As permitted by contrast timing, patent. ANATOMIC VARIANTS: None Review of the MIP images confirms the above findings. IMPRESSION: 1. No emergent large vessel occlusion or high-grade stenosis of the intracranial arteries. 2. Mild bilateral carotid bifurcation atherosclerosis without hemodynamically significant stenosis by NASCET criteria. Aortic Atherosclerosis (ICD10-I70.0). Electronically Signed   By: Ulyses Jarred M.D.   On: 03/23/2021 19:38   CT ANGIO NECK CODE STROKE  Result Date: 03/23/2021 : CLINICAL DATA:   Expressive aphasia EXAM: CT ANGIOGRAPHY HEAD AND NECK TECHNIQUE: Multidetector CT imaging of the head and neck was performed using the standard protocol during bolus administration of intravenous contrast. Multiplanar CT image reconstructions and MIPs were obtained to evaluate the vascular anatomy. Carotid stenosis measurements (when applicable) are obtained utilizing NASCET criteria, using the distal internal carotid diameter as the denominator. CONTRAST:   70mL OMNIPAQUE IOHEXOL 350 MG/ML SOLN COMPARISON:   None. FINDINGS: CTA NECK FINDINGS SKELETON: There is no bony spinal canal stenosis. No lytic or blastic lesion. OTHER NECK: Normal pharynx, larynx and major salivary glands. No cervical lymphadenopathy. Unremarkable thyroid gland. UPPER CHEST: No pneumothorax or pleural effusion. No nodules or masses. AORTIC ARCH: There is calcific atherosclerosis of the aortic arch. There is no aneurysm, dissection or hemodynamically  significant stenosis of the visualized portion of the aorta. Conventional 3 vessel aortic branching pattern. The visualized proximal subclavian arteries are widely patent. RIGHT CAROTID SYSTEM: No dissection, occlusion or aneurysm. Mild atherosclerotic calcification at the carotid bifurcation without hemodynamically significant stenosis. LEFT CAROTID SYSTEM: No dissection, occlusion or aneurysm. Mild atherosclerotic calcification  at the carotid bifurcation without hemodynamically significant stenosis. VERTEBRAL ARTERIES: Left dominant configuration. Both origins are clearly patent. There is no dissection, occlusion or flow-limiting stenosis to the skull base (V1-V3 segments). CTA HEAD FINDINGS POSTERIOR CIRCULATION: --Vertebral arteries: Normal V4 segments. --Inferior cerebellar arteries: Normal. --Basilar artery: Normal. --Superior cerebellar arteries: Normal. --Posterior cerebral arteries (PCA): Normal. ANTERIOR CIRCULATION: --Intracranial internal carotid arteries: Normal. --Anterior cerebral arteries (ACA): Normal. Both A1 segments are present. Patent anterior communicating artery (a-comm). --Middle cerebral arteries (MCA): Normal. VENOUS SINUSES: As permitted by contrast timing, patent. ANATOMIC VARIANTS: None Review of the MIP images confirms the above findings. IMPRESSION: 1. No emergent large vessel occlusion or high-grade stenosis of the intracranial arteries. 2. Mild bilateral carotid bifurcation atherosclerosis without hemodynamically significant stenosis by NASCET criteria. Aortic Atherosclerosis (ICD10-I70.0). Electronically Signed   By: Ulyses Jarred M.D.   On: 03/23/2021 19:45       HISTORY OF PRESENT ILLNESS Denaisha Swango is an 75 y.o. female with a history of atral fibrillation (not compliant with anticoagulation), asthma, chronic combined systolic and diastolic HF, CAD, HTN, hypercholesterolemia and CAD s/p angioplasty and CABG who presents via EMS from home with acute onset of dense receptive  and expressive aphasia. EMS was called and on arrival they noted same. BP per EMS was 183/109, CBG was 216. On arrival to the ED, she continued to be densely aphasic. No facial droop or motor weakness was noted. She was emergently treated with tPA.    HOSPITAL COURSE During hospital stay she had an asthmatic exacerbation, requiring BiPAP and CCM & Hospitalitis consult. She required a nasoenteric feeding tube for meds/nutrition. She was finally able to improve swallow enough for a modified diet and the tube can be removed once she is tolerating at least 50% of all meals (expect this to be today as she did well with breakfast). She is now stable and will need extensive rehab efforts. She had a been acceptance in to inpt rehab for stroke recovery. And bed available on 6/24. However the pts daughter declines going to inpatient rehab despite best efforts to explain the high intensity needs of the pt, and wants to take her mom home with [facility] She has been advised that the pt cannot be left alone and will require 24/7 supervision and care. DME equipment has been ordered and will be delivered to her home.  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 fib not on AC Resultant global aphasia with right hemiparesis CT no acute abnormality CTA head and neck no LVO 2D Echo EF 60 to 65% LDL 80.9 HgbA1c 8.8 HTS for cerebral edema, NA goal 150-155, now off and was getting flushes via tube. Now that tube removed, will let come down naturally; monitoring SCDs for VTE prophylaxis No antithrombotic prior to admission, No antithrombotic within 24 hours of tPA due to hemorrhagic transformation; allowed 7 days before starting eliquis for Afib. Ongoing aggressive stroke risk factor management Therapy recommendations: CIR; but family refused and will take her home with 24/7 supervision and [facility] services Disposition: home with HH   Chronic A. fib with RVR Bradycardia to 31s Follows with Dr.  Oval Linsey on 01/28/20 Home meds including Coreg and Cardizem on board Metoprolol IV as needed restarted home meds  Cardiology consulted for assistance; continuing Coreg 25 BID for rate control per their recommendations Was on Coumadin before but taken off by patient self "concern that it was stiffening her arteries" "unwilling to take Eliquis or Xarelto because she  does not know "what is in them."   taking a natural blood thinner sold over-the-counter upon the recommendation of her herbalist. Have discussed initiating a DOAC after 5 to 7 days for atrial fibrillation with daughter- she is in agreement. Started on evening of 6/24.   Hypertension Unstable, high Was on Cardene drip, now off and on Coreg, Lisinopril.  Lisinopril increased from 20 to 40 on 6/22 for better SBP control SBP goal is normotensive at this time   DM2 with uncontrolled hyperglycemia On Insulin drip per CCM while in ICU, now on SSI Appreciate CCM assistance A1C 8.8, uncontrolled with goal of less than 7.0 to control stroke risk factor         Asthma Exacerbation Required BiPAP at night Management per CCM, appreciated IV SM started; will wean down now given improvement   Hyperlipidemia Home meds: None LDL 80.9, goal < 70 Continue statin at discharge   Dysphagia   Speech following-cleared for dysphagia 1 pured thick liquid diet 03/30/2021, tol up to 50% meals now Cortrak for enteral nutrition has been removed         AKI CKD Monitor renal function Renally does medications   Other Stroke Risk Factors Advanced age Obesity, Body mass index is 35.54 kg/m.  Coronary artery disease status post CABG CHF  DISCHARGE EXAM Blood pressure (!) 158/105, pulse 85, temperature 97.6 F (36.4 C), temperature source Oral, resp. rate 14, height 5\' 3"  (1.6 m), weight 91 kg, SpO2 96 %. Lungs: some min wheezing only General -obese elderly Caucasian lady.  Not in distress. Cardiovascular - irregularly irregular heart rate  and rhythm with afib on tele, trace pedal edema noted.   Neuro - Alert. Sitting up in bed with her eyes open and is attentive to examiner. Global aphasia but speaks occasional words and short sentences and difficult to understand.  Now able to follow simple with pantomime. Left gaze preference, barely cross midline, tracking on the left, inconsistently blinking to visual threat on the left but not blinking to the right. Right mild facial droop. Tongue protrusion not cooperative.  Able to move right upper extremity against gravity but mild weakness of grip and intrinsic hand muscles. LUE antigravity. BLE withdraws to noxious with left more than right. UTA sensation and coordination given aphasia. Gait deferred due to right sided weakness.  Discharge Diet      Diet   DIET - DYS 1 Room service appropriate? No; Fluid consistency: Nectar Thick   liquids  DISCHARGE PLAN Disposition:  Home with HH for Rehab for ongoing PT, OT and ST Start Eliquis (apixaban) daily BID for secondary stroke prevention.  Recommend ongoing stroke risk factor control by Primary Care Physician at time of discharge from inpatient rehabilitation. Follow-up PCP Glendale Chard, MD in 2 weeks following discharge from rehab. Follow-up in [facility] in 4 weeks following discharge from rehab, office to schedule an appointment.   40 minutes were spent preparing discharge.  Areyanna Figeroa Metzger-Cihelka, ARNP-C, ANVP-BC Pager: [phone]

## 2021-04-01 DIAGNOSIS — G936 Cerebral edema: Secondary | ICD-10-CM

## 2021-04-01 LAB — CBC
HCT: 41.7 % (ref 36.0–46.0)
Hemoglobin: 13.5 g/dL (ref 12.0–15.0)
MCH: 29.7 pg (ref 26.0–34.0)
MCHC: 32.4 g/dL (ref 30.0–36.0)
MCV: 91.6 fL (ref 80.0–100.0)
Platelets: 226 10*3/uL (ref 150–400)
RBC: 4.55 MIL/uL (ref 3.87–5.11)
RDW: 13.2 % (ref 11.5–15.5)
WBC: 16.9 10*3/uL — ABNORMAL HIGH (ref 4.0–10.5)
nRBC: 0.1 % (ref 0.0–0.2)

## 2021-04-01 LAB — BASIC METABOLIC PANEL
Anion gap: 8 (ref 5–15)
BUN: 38 mg/dL — ABNORMAL HIGH (ref 8–23)
CO2: 26 mmol/L (ref 22–32)
Calcium: 8.8 mg/dL — ABNORMAL LOW (ref 8.9–10.3)
Chloride: 116 mmol/L — ABNORMAL HIGH (ref 98–111)
Creatinine, Ser: 0.87 mg/dL (ref 0.44–1.00)
GFR, Estimated: >60 mL/min (ref >60)
Glucose, Bld: 200 mg/dL — ABNORMAL HIGH (ref 70–99)
Potassium: 2.9 mmol/L — ABNORMAL LOW (ref 3.5–5.1)
Sodium: 150 mmol/L — ABNORMAL HIGH (ref 135–145)

## 2021-04-01 LAB — GLUCOSE, CAPILLARY
Glucose-Capillary: 124 mg/dL — ABNORMAL HIGH (ref 70–99)
Glucose-Capillary: 126 mg/dL — ABNORMAL HIGH (ref 70–99)
Glucose-Capillary: 208 mg/dL — ABNORMAL HIGH (ref 70–99)

## 2021-04-01 LAB — MAGNESIUM: Magnesium: 2 mg/dL (ref 1.7–2.4)

## 2021-04-01 MED ORDER — ATORVASTATIN CALCIUM 10 MG PO TABS: 20.0000 mg | ORAL_TABLET | Freq: Every day | ORAL | Status: DC

## 2021-04-01 MED ORDER — DILTIAZEM 12 MG/ML ORAL SUSPENSION
60.0000 mg | Freq: Four times a day (QID) | ORAL | Status: DC
  Administered 2021-04-01: 60 mg via ORAL
  Filled 2021-04-01 (×4): qty 6

## 2021-04-01 MED ORDER — SENNOSIDES-DOCUSATE SODIUM 8.6-50 MG PO TABS: 1.0000 | ORAL_TABLET | Freq: Two times a day (BID) | ORAL | Status: DC

## 2021-04-01 MED ORDER — MELATONIN 5 MG PO TABS: 5.0000 mg | ORAL_TABLET | Freq: Every day | ORAL | Status: DC

## 2021-04-01 MED ORDER — GLIMEPIRIDE 4 MG PO TABS
4.0000 mg | ORAL_TABLET | Freq: Every day | ORAL | Status: DC
  Administered 2021-04-01: 4 mg via ORAL
  Filled 2021-04-01: qty 1

## 2021-04-01 MED ORDER — PANTOPRAZOLE SODIUM 40 MG PO PACK: 40.0000 mg | PACK | Freq: Two times a day (BID) | ORAL | 0 refills | Status: DC

## 2021-04-01 MED ORDER — PREDNISONE 10 MG (21) PO TBPK: ORAL_TABLET | ORAL | 0 refills | Status: DC

## 2021-04-01 MED ORDER — CARVEDILOL 12.5 MG PO TABS
25.0000 mg | ORAL_TABLET | Freq: Two times a day (BID) | ORAL | Status: DC
  Administered 2021-04-01: 25 mg via ORAL
  Filled 2021-04-01: qty 2

## 2021-04-01 MED ORDER — METFORMIN HCL 500 MG PO TABS: 500.0000 mg | ORAL_TABLET | Freq: Two times a day (BID) | ORAL | 0 refills | Status: DC

## 2021-04-01 MED ORDER — DILTIAZEM 12 MG/ML ORAL SUSPENSION: 60.0000 mg | Freq: Four times a day (QID) | ORAL | 0 refills | Status: DC

## 2021-04-01 MED ORDER — LISINOPRIL 20 MG PO TABS: 40.0000 mg | ORAL_TABLET | Freq: Every day | ORAL | 0 refills | Status: DC

## 2021-04-01 MED ORDER — ATORVASTATIN CALCIUM 10 MG PO TABS: 20.0000 mg | ORAL_TABLET | Freq: Every day | ORAL | 3 refills | Status: DC

## 2021-04-01 MED ORDER — APIXABAN 5 MG PO TABS: 5.0000 mg | ORAL_TABLET | Freq: Two times a day (BID) | ORAL | 0 refills | Status: AC

## 2021-04-01 MED ORDER — LISINOPRIL 20 MG PO TABS
40.0000 mg | ORAL_TABLET | Freq: Every day | ORAL | Status: DC
  Administered 2021-04-01: 40 mg via ORAL
  Filled 2021-04-01: qty 2

## 2021-04-01 MED ORDER — PANTOPRAZOLE SODIUM 40 MG PO PACK
40.0000 mg | PACK | Freq: Two times a day (BID) | ORAL | Status: DC
  Administered 2021-04-01: 40 mg via ORAL
  Filled 2021-04-01: qty 20

## 2021-04-01 NOTE — Progress Notes (Addendum)
Reviewed home medications with husband and grandson at bedside. Gave Eliquis 30-day free Rx card. Will call daughter with husband to review home care instructions. Advised patient and family on addition of Metformin for glucose control while completing steroid taper pack. Performed education on how to crush and administer medications. Answered all family and patient questions to the best of my ability.

## 2021-04-01 NOTE — TOC Transition Note (Addendum)
Transition of Care [facility]) - CM/SW Discharge Note   Patient Details  Name: Danielle Payne MRN: [id number] Date of Birth: 1945/12/06  [facility] CM/SW Contact:  Carles Collet, RN Phone Number: 04/01/2021, 10:37 AM   Clinical Narrative:    10:30 Notified by bedside RN that patient could DC today if DME was delivered home. Spoke w [employer] who sated they spoke w daughter Mateo Flow who opted to have it delivered tomorrow after 2pm, but it could be delivered today. Spoke w Mateo Flow and reiterated that DME could be delivered today and her mother could discharge today. She agreed to have DME delivered today. Notified [employer] who will arrange delivery today with Mateo Flow. [employer] notified of DC.  11:45 Notified by [employer] that delivery of DME would occur in the next hour. LVM w daughter Mateo Flow requesting callback to verify plans for Dc as it is reported to me by MD and NP that she is continuing to tell them that the equipment won't be delivered until tomorrow. 12:40 Calls to Mateo Flow still going to VM, calls to spouse Eddie Dibbles, going directly to VM. Unable to reach either after multiple attempts. Patient is medically stable for DC. If she does not DC today, will enter avoidable day due to family.   Met at bedside with grandson who states he will help assist family with errands today to ensure that patient will have comforts at discharge. Requetsed nurse to go over DC instructions w daughter Mateo Flow over the phone (she is prohibited from returning to hospital property as visitor). Provided w 30 day Eliquis card.     Final next level of care: [place] Barriers to Discharge: Continued Medical Work up   Patient Goals and CMS Choice Patient states their goals for this hospitalization and ongoing recovery are:: to return to her daughters home. CMS Medicare.gov Compare Post Acute Care list provided to:: Patient Represenative (must comment) Choice offered to / list presented to :  Patient, Adult Children (Spoke with daughter over the phone and a copy placed in the shadow chart.)  Discharge Placement                       Discharge Plan and Services   Discharge Planning Services: CM Consult Post Acute Care Choice: Durable Medical Equipment, Home Health          DME Arranged: Walker rolling, Hospital bed, Bedside commode (DME to be delivered to the daughters home address.) DME Agency: [employer] Date DME Agency Contacted: 03/31/21 Time DME Agency Contacted: [phone] Representative spoke with at DME Agency: Freda Munro [place]: RN, Disease Management, PT, OT, Nurse's Aide (SLP to be added on per agency has to get staff from [facility] [place]: [facility] [place] Date [place]: 03/31/21 Time [place]: 1400 Representative spoke with at [place]: [place]) Interventions     Readmission Risk Interventions No flowsheet data found.

## 2021-04-01 NOTE — Progress Notes (Signed)
Notified Dr. Erlinda Hong of [place] run of [place]. Checked on patient, no symptoms at this time. VSS. Will continue to monitor. Dr. Erlinda Hong states no action needed at this time.

## 2021-04-01 NOTE — Discharge Summary (Addendum)
Stroke Discharge Summary  Patient ID: Danielle Payne   MRN: [id number]      DOB: November 26, 1945  Date of Admission: 03/23/2021 Date of Discharge: 04/01/2021  Attending Physician:  Stroke, Md, MD, Stroke MD Consultant(s):    pulmonary/intensive care Hospitalitis, cardiology Patient's PCP:  Glendale Chard, MD  DISCHARGE DIAGNOSIS: Dysphasia, global aphasia, COPD exacerbation Active Problems:   Atrial fibrillation ([place])   Stroke (cerebrum) ([place])   Allergies as of 04/01/2021   No Known Allergies      Medication List     STOP taking these medications    diltiazem 180 MG 24 hr capsule Commonly known as: Cardizem CD   furosemide 40 MG tablet Commonly known as: LASIX   loratadine 10 MG tablet Commonly known as: CLARITIN   NON FORMULARY   NON FORMULARY   PROBIOTIC PO   Protease Powd       TAKE these medications    acetaminophen 325 MG tablet Commonly known as: TYLENOL Take 2 tablets (650 mg total) by mouth every 4 (four) hours as needed for mild pain (or temp > 37.5 C (99.5 F)).   albuterol 108 (90 Base) MCG/ACT inhaler Commonly known as: VENTOLIN HFA INHALE TWO PUFFS INTO THE LUNGS EVERY 4 HOURS AS NEEDED FOR WHEEZING OR SHORTNESS OF BREATH What changed:  how much to take how to take this when to take this reasons to take this additional instructions   apixaban 5 MG Tabs tablet Commonly known as: ELIQUIS Take 1 tablet (5 mg total) by mouth 2 (two) times daily.   atorvastatin 10 MG tablet Commonly known as: LIPITOR Take 2 tablets (20 mg total) by mouth daily.   carvedilol 25 MG tablet Commonly known as: COREG Take 1 tablet (25 mg total) by mouth 2 (two) times daily with a meal.   diltiazem 10 mg/ml  oral suspension Commonly known as: CARDIZEM Take 6 mLs (60 mg total) by mouth every 6 (six) hours.   food thickener Liqd Commonly known as: SIMPLYTHICK (HONEY/LEVEL 3/MODERATELY THICK) Take 1 packet by mouth as needed.   glimepiride 4 MG  tablet Commonly known as: AMARYL Take 4 mg by mouth daily with breakfast.   hydrALAZINE 10 MG tablet Commonly known as: APRESOLINE Take 1 tablet (10 mg total) by mouth every 8 (eight) hours.   lisinopril 20 MG tablet Commonly known as: ZESTRIL Take 2 tablets (40 mg total) by mouth daily. Start taking on: April 02, 2021   Melatonin 5 MG Tbdp Take 5 mg by mouth at bedtime.   metFORMIN 500 MG tablet Commonly known as: Glucophage Take 1 tablet (500 mg total) by mouth 2 (two) times daily with a meal.   metoprolol tartrate 5 MG/5ML Soln injection Commonly known as: LOPRESSOR Inject 5 mLs (5 mg total) into the vein every 6 (six) hours as needed (HR > 120).   pantoprazole sodium 40 mg/20 mL Pack Commonly known as: PROTONIX Take 20 mLs (40 mg total) by mouth 2 (two) times daily.   predniSONE 10 MG (21) Tbpk tablet Commonly known as: STERAPRED UNI-PAK 21 TAB taper   predniSONE 10 MG (21) Tbpk tablet Commonly known as: STERAPRED UNI-PAK 21 TAB taper   predniSONE 10 MG (21) Tbpk tablet Commonly known as: STERAPRED UNI-PAK 21 TAB taper Start taking on: April 02, 2021   QUEtiapine 25 MG tablet Commonly known as: SEROQUEL Take 1 tablet (25 mg total) by mouth at bedtime.  Durable Medical Equipment  (From admission, onward)           Start     Ordered   03/31/21 1433  For home use only DME Bedside commode  Once       Question:  Patient needs a bedside commode to treat with the following condition  Answer:  Stroke (cerebrum) ([place])   03/31/21 1432   03/31/21 1433  For home use only DME Hospital bed  Once       Question Answer Comment  Length of Need 6 Months   Patient has (list medical condition): stroke   Head must be elevated greater than: 45 degrees   Bed type Semi-electric      03/31/21 1432   03/31/21 1432  For home use only DME Walker rolling  Once       Question Answer Comment  Walker: With 5 Inch Wheels   Patient needs a walker to treat with  the following condition Stroke (cerebrum) ([place])      03/31/21 1432            LABORATORY STUDIES CBC    Component Value Date/Time   WBC 16.9 (H) 04/01/2021 0025   RBC 4.55 04/01/2021 0025   HGB 13.5 04/01/2021 0025   HGB 14.4 05/15/2018 0827   HCT 41.7 04/01/2021 0025   HCT 42.9 05/15/2018 0827   PLT 226 04/01/2021 0025   PLT 258 05/15/2018 0827   MCV 91.6 04/01/2021 0025   MCV 89 05/15/2018 0827   MCH 29.7 04/01/2021 0025   MCHC 32.4 04/01/2021 0025   RDW 13.2 04/01/2021 0025   RDW 14.9 05/15/2018 0827   LYMPHSABS 2.9 03/23/2021 1847   LYMPHSABS 2.5 05/15/2018 0827   MONOABS 0.8 03/23/2021 1847   EOSABS 0.2 03/23/2021 1847   EOSABS 0.2 05/15/2018 0827   BASOSABS 0.1 03/23/2021 1847   BASOSABS 0.0 05/15/2018 0827   CMP    Component Value Date/Time   NA 150 (H) 04/01/2021 0025   NA 138 03/04/2020 0000   K 2.9 (L) 04/01/2021 0025   CL 116 (H) 04/01/2021 0025   CO2 26 04/01/2021 0025   GLUCOSE 200 (H) 04/01/2021 0025   BUN 38 (H) 04/01/2021 0025   BUN 24 (A) 03/04/2020 0000   CREATININE 0.87 04/01/2021 0025   CREATININE 0.88 04/13/2019 1511   CALCIUM 8.8 (L) 04/01/2021 0025   PROT 7.4 03/23/2021 1847   PROT 7.4 11/05/2019 0902   ALBUMIN 3.8 03/23/2021 1847   ALBUMIN 4.3 11/05/2019 0902   AST 24 03/23/2021 1847   ALT 23 03/23/2021 1847   ALKPHOS 88 03/23/2021 1847   BILITOT 0.6 03/23/2021 1847   BILITOT 0.6 11/05/2019 0902   GFRNONAA >60 04/01/2021 0025   GFRAA 57 (L) 11/05/2019 0902   COAGS Lab Results  Component Value Date   INR 1.1 03/23/2021   INR 2.5 09/08/2019   INR 2.6 08/10/2019   Lipid Panel    Component Value Date/Time   CHOL 126 03/24/2021 0510   CHOL 191 11/05/2019 0902   TRIG 1,219 (H) 03/24/2021 0510   HDL 27 (L) 03/24/2021 0510   HDL 39 (L) 11/05/2019 0902   CHOLHDL NOT REPORTED DUE TO HIGH TRIGLYCERIDES 03/24/2021 0510   VLDL UNABLE TO CALCULATE IF TRIGLYCERIDE OVER 400 mg/dL 03/24/2021 0510   LDLCALC UNABLE TO CALCULATE IF  TRIGLYCERIDE OVER 400 mg/dL 03/24/2021 0510   LDLCALC 119 (H) 11/05/2019 0902   HgbA1C  Lab Results  Component Value Date   HGBA1C 8.8 (H)  03/24/2021   Urinalysis    Component Value Date/Time   COLORURINE YELLOW 03/28/2021 0837   APPEARANCEUR HAZY (A) 03/28/2021 0837   LABSPEC 1.024 03/28/2021 0837   PHURINE 5.0 03/28/2021 0837   GLUCOSEU 50 (A) 03/28/2021 0837   HGBUR NEGATIVE 03/28/2021 0837   BILIRUBINUR NEGATIVE 03/28/2021 0837   BILIRUBINUR Negative 11/18/2019 1252   KETONESUR 5 (A) 03/28/2021 0837   PROTEINUR NEGATIVE 03/28/2021 0837   UROBILINOGEN 0.2 11/18/2019 1252   NITRITE NEGATIVE 03/28/2021 0837   LEUKOCYTESUR NEGATIVE 03/28/2021 0837   Urine Drug Screen     Component Value Date/Time   LABOPIA NONE DETECTED 03/24/2021 0126   COCAINSCRNUR NONE DETECTED 03/24/2021 0126   LABBENZ NONE DETECTED 03/24/2021 0126   AMPHETMU NONE DETECTED 03/24/2021 0126   THCU NONE DETECTED 03/24/2021 0126   LABBARB NONE DETECTED 03/24/2021 0126    Alcohol Level    Component Value Date/Time   ETH <10 03/23/2021 1847     SIGNIFICANT DIAGNOSTIC STUDIES CT Head Wo Contrast  Result Date: 03/28/2021 CLINICAL DATA:  Stroke, follow-up EXAM: CT HEAD WITHOUT CONTRAST TECHNIQUE: Contiguous axial images were obtained from the base of the skull through the vertex without intravenous contrast. COMPARISON:  03/26/2021 FINDINGS: Brain: Areas of evolving recent infarction with hemorrhagic conversion again identified in the left frontal, parietal, and lateral occipital lobes. Similar associated mass effect remains mild. Unchanged small left temporal, likely subarachnoid hemorrhage. No new hemorrhage or loss of gray-white differentiation. Ventricles are stable in size. Vascular: No new findings. Skull: Calvarium is unremarkable. Sinuses/Orbits: No acute finding. Other: None. 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 Unchanged small left temporal, likely subarachnoid  hemorrhage. No new hemorrhage or worsening mass effect. Electronically Signed   By: Macy Mis M.D.   On: 03/28/2021 10:43   CT HEAD WO CONTRAST  Result Date: 03/26/2021 CLINICAL DATA:  Stroke follow-up EXAM: CT HEAD WITHOUT CONTRAST TECHNIQUE: Contiguous axial images were obtained from the base of the skull through the vertex without intravenous contrast. COMPARISON:  CT head 03/25/2021 FINDINGS: Brain: Acute infarct in the left middle frontal lobe unchanged with minimal hemorrhage, unchanged. Acute infarct left parietal lobe with hemorrhagic transformation unchanged from yesterday. Infarct also extends into the left occipital lobe hemorrhagic transformation unchanged. Small amount of hemorrhage in the left temporal lobe likely subarachnoid hemorrhage is unchanged. Slight midline shift to the right unchanged. Negative for hydrocephalus. No new area of infarct. Vascular: Negative for hyperdense vessel Skull: Negative Sinuses/Orbits: Mucosal edema paranasal sinuses.  Negative orbit Other: None IMPRESSION: No change from yesterday. Acute infarct in the left MCA and left PCA territory with hemorrhagic transformation in the left occipital and parietal lobes. Acute infarct left middle frontal gyrus with minimal hemorrhage, unchanged. No new area of hemorrhage or infarct. Electronically Signed   By: Franchot Gallo M.D.   On: 03/26/2021 18:47   CT HEAD WO CONTRAST  Result Date: 03/25/2021 CLINICAL DATA:  75 year old female code stroke presentation status post IV tPA. Left MCA territory infarct with mild hemorrhagic changes. EXAM: CT HEAD WITHOUT CONTRAST TECHNIQUE: Contiguous axial images were obtained from the base of the skull through the vertex without intravenous contrast. COMPARISON:  Head CT 03/24/2021 and earlier. FINDINGS: Brain: Mixed cytotoxic edema and rounded, nodular areas of hemorrhage redemonstrated in the left MCA territory. The several dominant foci of blood in the posterior left frontal and  left parietal lobes continuing toward the left occipital pole are stable and size and configuration since yesterday (e.g. Series 4, image 22).  Partial effacement of the left lateral ventricle and 2-3 mm of rightward midline shift are stable. No intraventricular hemorrhage. Probable gyral petechial hemorrhage at the left temporal operculum is stable on series 4, image 18. No definite extra-axial hemorrhage. Right hemisphere and posterior fossa gray-white matter differentiation is stable and within normal limits. No ventriculomegaly. Basilar cisterns remain patent. Vascular: Calcified atherosclerosis at the skull base. Skull: No acute osseous abnormality identified. Sinuses/Orbits: Visualized paranasal sinuses and mastoids are stable and well aerated. Other: Stable right nasoenteric tube. Mild leftward gaze is stable. Visualized scalp soft tissues are within normal limits. IMPRESSION: 1. Stable Left MCA infarct with hemorrhagic transformation since yesterday. Extension to the Left MCA/PCA watershed territory as before. Partially effaced left lateral ventricle and trace rightward midline shift are stable. No extra-axial extension of blood. 2. No new areas of involvement or new intracranial abnormality. Electronically Signed   By: Genevie Ann M.D.   On: 03/25/2021 05:19   CT HEAD WO CONTRAST  Result Date: 03/24/2021 CLINICAL DATA:  Stroke.  24 hours post tPA EXAM: CT HEAD WITHOUT CONTRAST TECHNIQUE: Contiguous axial images were obtained from the base of the skull through the vertex without intravenous contrast. COMPARISON:  CT head 03/23/2021 FINDINGS: Brain: Interval development of left MCA infarct with hypodensity in the left frontal and parietal lobe. There is also involvement of the left occipital lobe. There is hemorrhagic transformation of the infarct involving the left temporal and parietal lobe with a moderate amount of blood present. Ventricle size normal.  No midline shift. Vascular: Negative for hyperdense  vessel Skull: Negative Sinuses/Orbits: Mucosal edema paranasal sinuses. Feeding tube in place. Air-fluid level sphenoid sinus. Negative orbit. Other: None IMPRESSION: Interval development of acute infarct in the left MCA territory with hypodensity in the left temporal and parietal lobe. This also extends into the left occipital lobe. There has been interval hemorrhagic transformation of the infarct with moderate amount of blood present within the infarction. Dr. Cheral Marker  has been paged to discuss the results. Electronically Signed   By: Franchot Gallo M.D.   On: 03/24/2021 18:30   DG CHEST PORT 1 VIEW  Result Date: 03/28/2021 CLINICAL DATA:  Hypoxia.  Stroke. EXAM: PORTABLE CHEST 1 VIEW COMPARISON:  03/26/2021. 11/20/2018. FINDINGS: Feeding tube noted with tip below left hemidiaphragm. Cardiomegaly again noted. Left atrial appendage clip in stable position. Pulmonary venous congestion. Bilateral interstitial prominence again noted. Findings suggest CHF. Low lung volumes. Small left pleural effusion may be present. No pneumothorax. Surgical clips left upper quadrant. IMPRESSION: 1.  Feeding tube noted with tip below left hemidiaphragm. 2. Cardiomegaly. Left atrial appendage clip in stable position. Bilateral interstitial prominence. Findings suggest congestive heart failure with interstitial edema. Small left pleural effusion may be present. Electronically Signed   By: Marcello Moores  Register   On: 03/28/2021 11:13   DG CHEST PORT 1 VIEW  Result Date: 03/26/2021 CLINICAL DATA:  Respiratory distress. EXAM: PORTABLE CHEST 1 VIEW COMPARISON:  Chest radiograph 03/24/2021. FINDINGS: Enteric tube courses inferior to the diaphragm. Stable cardiomegaly status post median sternotomy. Bilateral interstitial pulmonary opacities. No pleural effusion or pneumothorax. Thoracic spine degenerative changes. IMPRESSION: Cardiomegaly with mild interstitial opacities suggestive of edema. Electronically Signed   By: Lovey Newcomer M.D.    On: 03/26/2021 16:56   DG CHEST PORT 1 VIEW  Result Date: 03/24/2021 CLINICAL DATA:  Fevers EXAM: PORTABLE CHEST 1 VIEW COMPARISON:  11/20/2018 FINDINGS: Cardiac shadow is enlarged. Postsurgical changes are again seen. Feeding catheter is noted extending into the stomach.  Lungs are hypoinflated. Mild vascular congestion is noted without parenchymal edema. Left basilar opacity is noted which may represent early infiltrate. IMPRESSION: Mild CHF without pulmonary edema. Left basilar opacity which may represent an early infiltrate. Electronically Signed   By: Inez Catalina M.D.   On: 03/24/2021 17:54   DG Abd Portable 1V  Result Date: 03/24/2021 CLINICAL DATA:  Feeding tube placement EXAM: PORTABLE ABDOMEN - 1 VIEW COMPARISON:  Portable exam 1314 hours without priors for comparison FINDINGS: Tip of feeding tube projects over distal gastric antrum. Visualized bowel gas pattern normal. Atherosclerotic calcifications aorta and splenic artery. Prior median sternotomy and LEFT atrial appendage clipping. Subsegmental atelectasis at LEFT lung base. IMPRESSION: Tip of feeding tube projects over distal gastric antrum. Electronically Signed   By: Lavonia Dana M.D.   On: 03/24/2021 13:40   DG Swallowing Func-Speech Pathology  Result Date: 03/29/2021 Formatting of this result is different from the original. Objective Swallowing Evaluation: Type of Study: MBS-Modified Barium Swallow Study  Patient Details Name: Danielle Payne MRN: [id number] Date of Birth: 16-Dec-1945 Today's Date: 03/29/2021 Time: SLP Start Time (ACUTE ONLY): 17 -SLP Stop Time (ACUTE ONLY): 3267 SLP Time Calculation (min) (ACUTE ONLY): 16 min Past Medical History: Past Medical History: Diagnosis Date  Allergy   Asthma   Atrial fibrillation ([place])   Chronic combined systolic and diastolic heart failure ([place]) 05/14/2018  Coronary artery disease   Diverticulitis   Diverticulitis   Hypertension   Pure hypercholesterolemia 05/14/2018  Stented coronary artery  Past  Surgical History: Past Surgical History: Procedure Laterality Date  ABDOMINAL HYSTERECTOMY    CARDIAC CATHETERIZATION    CARDIOVERSION N/A 12/21/2014  Procedure: CARDIOVERSION;  Surgeon: Adrian Prows, MD;  Location: [facility];  Service: Cardiovascular;  Laterality: N/A;  CORONARY ANGIOPLASTY    CORONARY ARTERY BYPASS GRAFT   HPI: Lauralynn Loeb is an 75 y.o. female adm with aphasia - receptive and expressive - Found to have Left MCA CVA and is s/p tPa.   PMH + for atral fibrillation, asthma, chronic combined systolic and diastolic HF, CAD, HTN, hypercholesterolemia and CAD s/p angioplasty and CABG who presents with acute onset of dense receptive and expressive aphasia.  CT showed  Interval development of acute infarct in the left MCA territory with hypodensity in the left temporal and parietal lobe. This also extends into the left occipital lobe.  CXR 6/17 concerning for edema/Mild CHF, Left basilar opacity which may represent an early infiltrate. Pt has not passed  [facility] swallow screen and has not been ready for po.  Has Cortrak in place at this time.  Has been lethargic with some increased Respiratory demands and per RN, CCM has been consulted.  No data recorded Assessment / Plan / Recommendation CHL IP CLINICAL IMPRESSIONS 03/29/2021 Clinical Impression Pt's oropharyngeal dysphagia is marked by incoordinated oral to pharyngeal transit and poorly timed laryngeal closure leading to mild aspiration. Xerostomia impacted oral transit with cracker and tongue base residue with thicker textures. Using a straw with nectar pt consumed larger sip which entered laryngeal vestibule pre closure, aspirated during swallow (trace) then pt immediately exhaled post swallow blowing bubbles into cup.  Delayed cough was present. Thin penetrated prior to swallow and appeared to spontaneously clear. Most consistencies reached valleculae or pyriform sinuses for >3-5 seconds before swallow onset. Coordination of swallows and respirations  was decreased and apniec period perhaps shortened. She is impulsive and thin liquids not recommended at this time. Nectar thick via TEASPOON, puree, pills crushed, full supervision and controlled environment  with meals. SLP Visit Diagnosis Dysphagia, oropharyngeal phase (R13.12) Attention and concentration deficit following -- Frontal lobe and executive function deficit following -- Impact on safety and function Moderate aspiration risk   CHL IP TREATMENT RECOMMENDATION 03/29/2021 Treatment Recommendations Therapy as outlined in treatment plan below   Prognosis 03/29/2021 Prognosis for Safe Diet Advancement Good Barriers to Reach Goals -- Barriers/Prognosis Comment -- CHL IP DIET RECOMMENDATION 03/29/2021 SLP Diet Recommendations Dysphagia 1 (Puree) solids;Nectar thick liquid Liquid Administration via Spoon Medication Administration Crushed with puree Compensations Slow rate;Small sips/bites;Clear throat intermittently Postural Changes Seated upright at 90 degrees   CHL IP OTHER RECOMMENDATIONS 03/29/2021 Recommended Consults -- Oral Care Recommendations Oral care BID Other Recommendations --   CHL IP FOLLOW UP RECOMMENDATIONS 03/29/2021 Follow up Recommendations Inpatient Rehab   CHL IP FREQUENCY AND DURATION 03/29/2021 Speech Therapy Frequency (ACUTE ONLY) min 2x/week Treatment Duration 2 weeks      CHL IP ORAL PHASE 03/29/2021 Oral Phase Impaired Oral - Pudding Teaspoon -- Oral - Pudding Cup -- Oral - Honey Teaspoon -- Oral - Honey Cup Reduced posterior propulsion Oral - Nectar Teaspoon -- Oral - Nectar Cup Reduced posterior propulsion;Lingual/palatal residue Oral - Nectar Straw Reduced posterior propulsion;Lingual/palatal residue Oral - Thin Teaspoon -- Oral - Thin Cup Right anterior bolus loss Oral - Thin Straw -- Oral - Puree Reduced posterior propulsion;Lingual/palatal residue Oral - Mech Soft -- Oral - Regular Reduced posterior propulsion;Other (Comment) Oral - Multi-Consistency -- Oral - Pill -- Oral Phase -  Comment --  CHL IP PHARYNGEAL PHASE 03/29/2021 Pharyngeal Phase Impaired Pharyngeal- Pudding Teaspoon -- Pharyngeal -- Pharyngeal- Pudding Cup -- Pharyngeal -- Pharyngeal- Honey Teaspoon -- Pharyngeal -- Pharyngeal- Honey Cup WFL Pharyngeal -- Pharyngeal- Nectar Teaspoon -- Pharyngeal -- Pharyngeal- Nectar Cup Delayed swallow initiation-pyriform sinuses;Pharyngeal residue - valleculae;Inter-arytenoid space residue Pharyngeal -- Pharyngeal- Nectar Straw Penetration/Aspiration before swallow;Delayed swallow initiation-pyriform sinuses Pharyngeal Material enters airway, passes BELOW cords and not ejected out despite cough attempt by patient Pharyngeal- Thin Teaspoon -- Pharyngeal -- Pharyngeal- Thin Cup Penetration/Aspiration before swallow Pharyngeal Material enters airway, remains ABOVE vocal cords then ejected out Pharyngeal- Thin Straw -- Pharyngeal -- Pharyngeal- Puree Delayed swallow initiation-vallecula Pharyngeal -- Pharyngeal- Mechanical Soft -- Pharyngeal -- Pharyngeal- Regular Pharyngeal residue - valleculae Pharyngeal -- Pharyngeal- Multi-consistency -- Pharyngeal -- Pharyngeal- Pill -- Pharyngeal -- Pharyngeal Comment --  CHL IP CERVICAL ESOPHAGEAL PHASE 03/29/2021 Cervical Esophageal Phase (No Data) Pudding Teaspoon -- Pudding Cup -- Honey Teaspoon -- Honey Cup -- Nectar Teaspoon -- Nectar Cup -- Nectar Straw -- Thin Teaspoon -- Thin Cup -- Thin Straw -- Puree -- Mechanical Soft -- Regular -- Multi-consistency -- Pill -- Cervical Esophageal Comment -- [name] 03/29/2021, 2:53 PM              EEG adult  Result Date: 03/26/2021 Lora Havens, MD     03/26/2021  9:04 PM Patient Name: Danielle Payne MRN: [id number] Epilepsy Attending: Lora Havens Referring Physician/Provider: Dr Amie [place] Date: 03/26/2021 Duration: 24.33 mins Patient history: 75yo F with L MCA stroke, continues to be altered. EEG to evaluate for status epilepticus. Level of alertness: lethargic AEDs during EEG study:  None Technical aspects: This EEG was obtained using a 10 lead EEG system positioned circumferentially without any parasagittal coverage (rapid EEG). Computer selected EEG is reviewed as well as background features and all clinically significant events.  Description: EEG showed continuous generalized and lateralized left hemisphere polymorphic 3 to 6 Hz theta-delta slowing.  Hyperventilation and photic stimulation  were not performed.   ABNORMALITY - Continuous slow, generalized and lateralized left hemisphere IMPRESSION: This study is  suggestive of cortical dysfunction arising from left hemisphere, likely secondary to underlying stroke as well as severe diffuse encephalopathy, nonspecific etiology. No seizures or epileptiform discharges were seen throughout the recording. Dr Rory Percy was notified. Lora Havens   ECHOCARDIOGRAM COMPLETE  Result Date: 03/24/2021    ECHOCARDIOGRAM REPORT   Patient Name:   Danielle Payne Date of Exam: 03/24/2021 Medical Rec #:  [id number]       Height:       63.0 in Accession #:    [id number]      Weight:       203.7 lb Date of Birth:  [birth year]      BSA:          1.949 m Patient Age:    27 years        BP:           169/83 mmHg Patient Gender: F               HR:           109 bpm. Exam Location:  Inpatient Procedure: 2D Echo Indications:    stroke  History:        Patient has prior history of Echocardiogram examinations, most                 recent 05/26/2018. CAD, Arrythmias:Atrial Fibrillation; Risk                 Factors:Diabetes and Hypertension.  Sonographer:    Johny Chess Referring Phys: [id number] HUNTER J COLLINS  Sonographer Comments: Suboptimal apical window. Image acquisition challenging due to uncooperative patient, Image acquisition challenging due to patient body habitus and restraints. kept moving. IMPRESSIONS  1. Left ventricular ejection fraction, by estimation, is 60 to 65%. The left ventricle has normal function. The left ventricle has no regional wall  motion abnormalities. Left ventricular diastolic function could not be evaluated.  2. Right ventricular systolic function is normal. The right ventricular size is normal.  3. The mitral valve is grossly normal. No evidence of mitral valve regurgitation. No evidence of mitral stenosis.  4. The aortic valve is grossly normal. Aortic valve regurgitation is mild. No aortic stenosis is present. FINDINGS  Left Ventricle: Left ventricular ejection fraction, by estimation, is 60 to 65%. The left ventricle has normal function. The left ventricle has no regional wall motion abnormalities. The left ventricular internal cavity size was normal in size. There is  borderline left ventricular hypertrophy. Left ventricular diastolic function could not be evaluated due to atrial fibrillation. Left ventricular diastolic function could not be evaluated. Right Ventricle: The right ventricular size is normal. Right vetricular wall thickness was not well visualized. Right ventricular systolic function is normal. Left Atrium: Left atrial size was normal in size. Right Atrium: Right atrial size was not well visualized. Pericardium: There is no evidence of pericardial effusion. Mitral Valve: The mitral valve is grossly normal. No evidence of mitral valve regurgitation. No evidence of mitral valve stenosis. Tricuspid Valve: The tricuspid valve is grossly normal. Tricuspid valve regurgitation is not demonstrated. Aortic Valve: The aortic valve is grossly normal. Aortic valve regurgitation is mild. No aortic stenosis is present. Pulmonic Valve: The pulmonic valve was grossly normal. Pulmonic valve regurgitation is not visualized. Aorta: The aortic root and ascending aorta are structurally normal, with no evidence of dilitation. IAS/Shunts: The atrial septum is grossly normal.  LEFT VENTRICLE PLAX 2D LVIDd:         4.60 cm LVIDs:         3.40 cm LV PW:         1.10 cm LV IVS:        1.30 cm LVOT diam:     1.90 cm LVOT Area:     2.84 cm  IVC IVC  diam: 2.20 cm LEFT ATRIUM           Index LA diam:      4.40 cm 2.26 cm/m LA Vol (A4C): 60.2 ml 30.89 ml/m   AORTA Ao Root diam: 2.80 cm Ao Asc diam:  3.20 cm  SHUNTS Systemic Diam: 1.90 cm Mertie Moores MD Electronically signed by Mertie Moores MD Signature Date/Time: 03/24/2021/5:19:01 PM    Final    CT HEAD CODE STROKE WO CONTRAST  Result Date: 03/23/2021 CLINICAL DATA:  Code stroke.  Acute neuro deficit.  Aphasia EXAM: CT HEAD WITHOUT CONTRAST TECHNIQUE: Contiguous axial images were obtained from the base of the skull through the vertex without intravenous contrast. COMPARISON:  None. FINDINGS: Brain: Mild atrophy and mild white matter hypodensity bilaterally. Negative for acute infarct, hemorrhage, mass. Vascular: Negative for hyperdense vessel Skull: Negative Sinuses/Orbits: Mild mucosal edema paranasal sinuses. Negative orbit Other: None ASPECTS ([place] Stroke Program Early CT Score) - Ganglionic level infarction (caudate, lentiform nuclei, internal capsule, insula, M1-M3 cortex): 7 - Supraganglionic infarction (M4-M6 cortex): 3 Total score (0-10 with 10 being normal): 10 IMPRESSION: 1. No acute abnormality. 2. ASPECTS is 10 3. Code stroke imaging results were communicated on 03/23/2021 at 6:59 pm to provider [place] via text page Electronically Signed   By: Franchot Gallo M.D.   On: 03/23/2021 19:00   CT ANGIO HEAD CODE STROKE  Result Date: 03/23/2021 CLINICAL DATA:  Expressive aphasia EXAM: CT ANGIOGRAPHY HEAD AND NECK TECHNIQUE: Multidetector CT imaging of the head and neck was performed using the standard protocol during bolus administration of intravenous contrast. Multiplanar CT image reconstructions and MIPs were obtained to evaluate the vascular anatomy. Carotid stenosis measurements (when applicable) are obtained utilizing NASCET criteria, using the distal internal carotid diameter as the denominator. CONTRAST:  58mL OMNIPAQUE IOHEXOL 350 MG/ML SOLN COMPARISON:  None. FINDINGS: CTA NECK  FINDINGS SKELETON: There is no bony spinal canal stenosis. No lytic or blastic lesion. OTHER NECK: Normal pharynx, larynx and major salivary glands. No cervical lymphadenopathy. Unremarkable thyroid gland. UPPER CHEST: No pneumothorax or pleural effusion. No nodules or masses. AORTIC ARCH: There is calcific atherosclerosis of the aortic arch. There is no aneurysm, dissection or hemodynamically significant stenosis of the visualized portion of the aorta. Conventional 3 vessel aortic branching pattern. The visualized proximal subclavian arteries are widely patent. RIGHT CAROTID SYSTEM: No dissection, occlusion or aneurysm. Mild atherosclerotic calcification at the carotid bifurcation without hemodynamically significant stenosis. LEFT CAROTID SYSTEM: No dissection, occlusion or aneurysm. Mild atherosclerotic calcification at the carotid bifurcation without hemodynamically significant stenosis. VERTEBRAL ARTERIES: Left dominant configuration. Both origins are clearly patent. There is no dissection, occlusion or flow-limiting stenosis to the skull base (V1-V3 segments). CTA HEAD FINDINGS POSTERIOR CIRCULATION: --Vertebral arteries: Normal V4 segments. --Inferior cerebellar arteries: Normal. --Basilar artery: Normal. --Superior cerebellar arteries: Normal. --Posterior cerebral arteries (PCA): Normal. ANTERIOR CIRCULATION: --Intracranial internal carotid arteries: Normal. --Anterior cerebral arteries (ACA): Normal. Both A1 segments are present. Patent anterior communicating artery (a-comm). --Middle cerebral arteries (MCA): Normal. VENOUS SINUSES: As permitted by contrast timing, patent. ANATOMIC VARIANTS: None Review of the MIP images confirms  the above findings. IMPRESSION: 1. No emergent large vessel occlusion or high-grade stenosis of the intracranial arteries. 2. Mild bilateral carotid bifurcation atherosclerosis without hemodynamically significant stenosis by NASCET criteria. Aortic Atherosclerosis (ICD10-I70.0).  Electronically Signed   By: Ulyses Jarred M.D.   On: 03/23/2021 19:38   CT ANGIO NECK CODE STROKE  Result Date: 03/23/2021 : CLINICAL DATA:   Expressive aphasia EXAM: CT ANGIOGRAPHY HEAD AND NECK TECHNIQUE: Multidetector CT imaging of the head and neck was performed using the standard protocol during bolus administration of intravenous contrast. Multiplanar CT image reconstructions and MIPs were obtained to evaluate the vascular anatomy. Carotid stenosis measurements (when applicable) are obtained utilizing NASCET criteria, using the distal internal carotid diameter as the denominator. CONTRAST:   91mL OMNIPAQUE IOHEXOL 350 MG/ML SOLN COMPARISON:   None. FINDINGS: CTA NECK FINDINGS SKELETON: There is no bony spinal canal stenosis. No lytic or blastic lesion. OTHER NECK: Normal pharynx, larynx and major salivary glands. No cervical lymphadenopathy. Unremarkable thyroid gland. UPPER CHEST: No pneumothorax or pleural effusion. No nodules or masses. AORTIC ARCH: There is calcific atherosclerosis of the aortic arch. There is no aneurysm, dissection or hemodynamically significant stenosis of the visualized portion of the aorta. Conventional 3 vessel aortic branching pattern. The visualized proximal subclavian arteries are widely patent. RIGHT CAROTID SYSTEM: No dissection, occlusion or aneurysm. Mild atherosclerotic calcification at the carotid bifurcation without hemodynamically significant stenosis. LEFT CAROTID SYSTEM: No dissection, occlusion or aneurysm. Mild atherosclerotic calcification at the carotid bifurcation without hemodynamically significant stenosis. VERTEBRAL ARTERIES: Left dominant configuration. Both origins are clearly patent. There is no dissection, occlusion or flow-limiting stenosis to the skull base (V1-V3 segments). CTA HEAD FINDINGS POSTERIOR CIRCULATION: --Vertebral arteries: Normal V4 segments. --Inferior cerebellar arteries: Normal. --Basilar artery: Normal. --Superior cerebellar arteries:  Normal. --Posterior cerebral arteries (PCA): Normal. ANTERIOR CIRCULATION: --Intracranial internal carotid arteries: Normal. --Anterior cerebral arteries (ACA): Normal. Both A1 segments are present. Patent anterior communicating artery (a-comm). --Middle cerebral arteries (MCA): Normal. VENOUS SINUSES: As permitted by contrast timing, patent. ANATOMIC VARIANTS: None Review of the MIP images confirms the above findings. IMPRESSION: 1. No emergent large vessel occlusion or high-grade stenosis of the intracranial arteries. 2. Mild bilateral carotid bifurcation atherosclerosis without hemodynamically significant stenosis by NASCET criteria. Aortic Atherosclerosis (ICD10-I70.0). Electronically Signed   By: Ulyses Jarred M.D.   On: 03/23/2021 19:45      HISTORY OF PRESENT ILLNESS Tanaiya Kolarik is an 75 y.o. female with a history of atral fibrillation (not compliant with anticoagulation), asthma, chronic combined systolic and diastolic HF, CAD, HTN, hypercholesterolemia and CAD s/p angioplasty and CABG who presents via EMS from home with acute onset of dense receptive and expressive aphasia. EMS was called and on arrival they noted same. BP per EMS was 183/109, CBG was 216. On arrival to the ED, she continued to be densely aphasic. No facial droop or motor weakness was noted. She was emergently treated with tPA.     HOSPITAL COURSE During hospital stay she had an asthmatic exacerbation, requiring BiPAP and CCM & Hospitalitis consult. She was treated with IV Solumedrol, and will need a steroid taper. Her blood sugars have been running high d/t this and has required insulin gtt and then SQ SSI. Normally, this would be continued in a healthcare setting, but family refuse to have pt go to In patient rehab. Since this will be a difficult regiment at home, we have d/w hospitalist and will send home on metformin. She may not need this long term and only until  steroid taper is completed. She needs immediate f/u in 1 week  with PCP to assist with ongoing f/u on all her multiple comorbidities.  She required a nasoenteric feeding tube for meds/nutrition and continues to need SLP. She was finally able to improve swallow enough for a modified diet with thickened liquids (careful attention to feeding technique, appropriate thickness to liquids was reviewed at bedside with husband). Feeding tube was removed on 6/24.  She is now medically stable and will need extensive rehab efforts and constant care. She had a been acceptance in to inpt rehab for stroke recovery, and d/c was done for a bed available on 6/24. However, the pts daughter declines going to inpatient rehab despite best efforts to explain the high intensity needs of the pt, and wants to take her mom home with [facility]. She has been advised that the pt cannot be left alone at any time d/t global aphasia and lack of communication of her needs. She will require 24/7 supervision and care. DME equipment has been ordered and delivery today confirmed with case mgt and family in person at bedside and dtr by speaker phone. Husband and grandson will be responsible for transport home by POV.   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 fib not on AC Resultant global aphasia with right hemiparesis CT no acute abnormality CTA head and neck no LVO 2D Echo EF 60 to 65% LDL 80.9 HgbA1c 8.8 HTS for clinically significant cerebral edema, NA goal 150-155, now off and was getting flushes via tube. Now that tube removed, will let come down naturally; She should return to normal po water intake.  SCDs for VTE prophylaxis No antithrombotic prior to admission, No antithrombotic within 24 hours of tPA due to hemorrhagic transformation; allowed 7 days before starting eliquis for Afib. Ongoing aggressive stroke risk factor management Therapy recommendations: CIR; but family refused and will take her home with 24/7 supervision and [facility] services Disposition:  home with [facility] despite recommendations for CIR   Chronic A. fib with RVR Bradycardia to 75s Follows with Dr. Oval Linsey on 01/28/20 Home meds including Coreg and Cardizem started Cardiology consulted for assistance; continuing Coreg 25 BID for rate control per their recommendations Was on Coumadin before but tpt quit taking this  "concern that it was stiffening her arteries" "unwilling to take Eliquis or Xarelto in past because she does not know "what is in them."   Pt started taking a natural blood thinner sold over-the-counter upon the recommendation of her herbalist. Have had multiple discussions with husband and dtr about initiating a DOAC for Afib. We waited to start this till 7 days post stroke given hemorrhagenic conversion. Started on evening of 6/24.   Hypertension Now stable on po meds Was on Cardene drip, now off and on Coreg, Lisinopril. Lisinopril increased from 20 to 40 on 6/22 for better SBP control SBP goal is normotensive at this time   DM2 with uncontrolled hyperglycemia Was on Insulin drip per CCM while in ICU, to a SSI on floor.  Elevated likely d/t IV steroids and now on taper  Per d/w Hospitalist for dc planning since now going home and not CIR, will add metformin. PCP will need to monitor and likely d/c once steroids are completed. A1C 8.8, uncontrolled with goal of less than 7.0 to control stroke risk factor         Acute Asthma Exacerbation Required BiPAP at night in ICU, now weaned to RA Management per  CCM, hospitalitis during stay is appreciated IV SM was given with good resolve; now on medrol taper   Hyperlipidemia Home meds: None LDL 80.9, goal < 70 Continue statin at discharge   Dysphagia  Careful instructions for feeding pt reviewed with husband Speech following-cleared for dysphagia 1 pured thick liquid diet 03/30/2021, tol up to 50% meals now Cortrak for enteral nutrition has been removed         CKD2 Monitor renal function   Other Stroke Risk  Factors Advanced age Medication non-compliance Obesity, Body mass index is 35.54 kg/m.  Coronary artery disease status post CABG CHF  DISCHARGE EXAM Blood pressure 137/80, pulse 74, temperature 98.1 F (36.7 C), temperature source Oral, resp. rate 16, height 5\' 3"  (1.6 m), weight 91 kg, SpO2 97 %. General -obese elderly Caucasian lady.  Not in distress. Cardiovascular - irregularly irregular heart rate and rhythm with afib on tele, trace pedal edema noted.   Neuro - Alert. Sitting up in bed with her eyes open and is attentive to examiner. Global aphasia but speaks occasional words and short sentences that do not make any sense and difficult to understand.  Now able to follow simple with pantomime. Left gaze preference, able to cross midline, tracking on the left, inconsistently blinking to visual threat on the left but not blinking to the right. Right mild facial droop. Tongue protrusion not cooperative.  Able to move right upper extremity against gravity but mild weakness of grip and intrinsic hand muscles. LUE antigravity. BLE withdraws to noxious with left more than right. UTA sensation and coordination given aphasia. Gait deferred due to right sided weakness.  Discharge Diet       Diet   DIET - DYS 1 Room service appropriate? No; Fluid consistency: Nectar Thick   liquids  DISCHARGE PLAN Disposition:  home with home health service Eliquis (apixaban) daily for secondary stroke prevention  Ongoing stroke risk factor control by Primary Care Physician at time of discharge Follow-up PCP Glendale Chard, MD in 1 weeks. Follow-up in [facility] in 4 weeks, office to schedule an appointment.   90 minutes were spent preparing discharge.  Desiree Metzger-Cihelka, ARNP-C, ANVP-BC Pager: [phone]    ATTENDING NOTE: I reviewed above note and agree with the assessment and plan. Pt was seen and examined.   Husband at bedside.  Patient laying in bed, awake,  alert, eyes open, receptive aphasia with word salad, not able to answer orientation questions and not following commands. Not able to name or repeat. No gaze palsy, tracking bilaterally, inconsistently blinking to visual threat bilaterally. Right nasolabial fold flattening with mild facial droop. Tongue protrusion not cooperative. Bilateral UEs 5/5, no drift. Bilaterally LEs 5/5, no drift. Sensation and coordination not cooperative, gait not tested.  Initially, talked with husband at bedside and daughter on the phone, stated that patient hospital bed will be delivered tomorrow 2 PM.  However, further discussion with social worker and RN indicate that hospital bed will be delivered today before 2 PM.  Then, daughter was contacted again for delivery today and patient discharged today.  There was extensive discussion back-and-forth but eventually patient was discharged home with home PT/OT.  Sodium 150, allow gradually trending down.  K 29, supplement.  Still has leukocytosis but improving, 20.3-> 19.0-> 16.9.  Patient and family encouraged to work hard with PT/OT as well as speech.  Continue Eliquis on discharge for stroke prevention. Add lipitor 20 on discharge for HLD.  Follow-up with stroke clinic at  [facility] in 4 weeks.  For detailed assessment and plan, please refer to above as I have made changes wherever appropriate.   Rosalin Hawking, MD PhD Stroke Neurology 04/01/2021 4:49 PM

## 2021-04-02 LAB — CULTURE, BLOOD (ROUTINE X 2)
Culture: NO GROWTH
Culture: NO GROWTH

## 2021-04-03 ENCOUNTER — Telehealth: Payer: Self-pay

## 2021-04-03 NOTE — Telephone Encounter (Signed)
Transition Care Management Unsuccessful Follow-up Telephone Call  Date of discharge and from where:  04/01/2021  Attempts:  1st Attempt  Reason for unsuccessful TCM follow-up call:  Left voice message

## 2021-04-18 ENCOUNTER — Telehealth: Payer: Self-pay

## 2021-04-18 NOTE — Telephone Encounter (Signed)
Transition Care Management Unsuccessful Follow-up Telephone Call  Date of discharge and from where:  04/01/2021   Attempts:  1st Attempt  Reason for unsuccessful TCM follow-up call:  Left voice message

## 2021-04-19 ENCOUNTER — Telehealth: Payer: Self-pay | Admitting: Internal Medicine

## 2021-04-19 ENCOUNTER — Telehealth: Payer: Self-pay

## 2021-04-19 NOTE — Telephone Encounter (Signed)
Transition Care Management Unsuccessful Follow-up Telephone Call  Date of discharge and from where:  04/01/2021 Neurology   Attempts:  2nd Attempt  Reason for unsuccessful TCM follow-up call:  Left voice message

## 2021-04-19 NOTE — Telephone Encounter (Signed)
Left message for patient to call back and schedule Medicare Annual Wellness Visit (AWV) either virtually or in office.    AWV-I PER [place] 07/08/10 please schedule at anytime with [facility] Nurse Health Advisor 1 or 2   This should be a 45 minute visit.

## 2021-04-19 NOTE — Telephone Encounter (Signed)
Transition Care Management Unsuccessful Follow-up Telephone Call  Date of discharge and from where:    Attempts:  3rd Attempt  Reason for unsuccessful TCM follow-up call:  Left voice message

## 2021-05-12 ENCOUNTER — Telehealth: Payer: Self-pay | Admitting: Internal Medicine

## 2021-05-12 NOTE — Telephone Encounter (Signed)
Left message for patient to call back and schedule Medicare Annual Wellness Visit (AWV) either virtually or in office. Left both  my jabber number [phone] and office number   AWV-I PER [place] 07/08/10 please schedule at anytime with [facility] Nurse Health Advisor 1 or 2   This should be a 45 minute visit.

## 2021-07-06 ENCOUNTER — Other Ambulatory Visit: Payer: Self-pay

## 2021-07-06 NOTE — Patient Outreach (Signed)
[facility]) Care Management  07/06/2021  Danielle Payne 19-Nov-1945 [id number]   Telephone outreach to patient to obtain mRS was successfully completed. MRS= 3   [place] Care Management Assistant

## 2022-02-27 ENCOUNTER — Encounter: Payer: Self-pay | Admitting: Family Medicine

## 2022-02-27 ENCOUNTER — Ambulatory Visit (INDEPENDENT_AMBULATORY_CARE_PROVIDER_SITE_OTHER): Payer: Medicare Other | Admitting: Family Medicine

## 2022-02-27 VITALS — BP 130/70 | HR 80 | Temp 98.2°F | Ht 63.0 in | Wt 169.0 lb

## 2022-02-27 DIAGNOSIS — E7800 Pure hypercholesterolemia, unspecified: Secondary | ICD-10-CM

## 2022-02-27 DIAGNOSIS — Z8673 Personal history of transient ischemic attack (TIA), and cerebral infarction without residual deficits: Secondary | ICD-10-CM

## 2022-02-27 DIAGNOSIS — E119 Type 2 diabetes mellitus without complications: Secondary | ICD-10-CM | POA: Diagnosis not present

## 2022-02-27 DIAGNOSIS — I10 Essential (primary) hypertension: Secondary | ICD-10-CM | POA: Diagnosis not present

## 2022-02-27 DIAGNOSIS — I480 Paroxysmal atrial fibrillation: Secondary | ICD-10-CM

## 2022-02-27 LAB — LIPID PANEL
Cholesterol: 205 mg/dL — ABNORMAL HIGH (ref 0–200)
HDL: 37.9 mg/dL — ABNORMAL LOW (ref >39.00)
LDL Cholesterol: 130 mg/dL — ABNORMAL HIGH (ref 0–99)
NonHDL: 166.63
Total CHOL/HDL Ratio: 5
Triglycerides: 184 mg/dL — ABNORMAL HIGH (ref 0.0–149.0)
VLDL: 36.8 mg/dL (ref 0.0–40.0)

## 2022-02-27 LAB — CBC WITH DIFFERENTIAL/PLATELET
Basophils Absolute: 0.1 10*3/uL (ref 0.0–0.1)
Basophils Relative: 0.6 % (ref 0.0–3.0)
Eosinophils Absolute: 0.1 10*3/uL (ref 0.0–0.7)
Eosinophils Relative: 1.7 % (ref 0.0–5.0)
HCT: 44.1 % (ref 36.0–46.0)
Hemoglobin: 14.6 g/dL (ref 12.0–15.0)
Lymphocytes Relative: 28.8 % (ref 12.0–46.0)
Lymphs Abs: 2.6 10*3/uL (ref 0.7–4.0)
MCHC: 33.1 g/dL (ref 30.0–36.0)
MCV: 89.7 fl (ref 78.0–100.0)
Monocytes Absolute: 0.7 10*3/uL (ref 0.1–1.0)
Monocytes Relative: 8.3 % (ref 3.0–12.0)
Neutro Abs: 5.5 10*3/uL (ref 1.4–7.7)
Neutrophils Relative %: 60.6 % (ref 43.0–77.0)
Platelets: 224 10*3/uL (ref 150.0–400.0)
RBC: 4.91 Mil/uL (ref 3.87–5.11)
RDW: 14 % (ref 11.5–15.5)
WBC: 9 10*3/uL (ref 4.0–10.5)

## 2022-02-27 LAB — COMPREHENSIVE METABOLIC PANEL
ALT: 14 U/L (ref 0–35)
AST: 12 U/L (ref 0–37)
Albumin: 4.2 g/dL (ref 3.5–5.2)
Alkaline Phosphatase: 84 U/L (ref 39–117)
BUN: 27 mg/dL — ABNORMAL HIGH (ref 6–23)
CO2: 27 mEq/L (ref 19–32)
Calcium: 9.7 mg/dL (ref 8.4–10.5)
Chloride: 104 mEq/L (ref 96–112)
Creatinine, Ser: 0.92 mg/dL (ref 0.40–1.20)
GFR: 60.02 mL/min (ref >60.00)
Glucose, Bld: 163 mg/dL — ABNORMAL HIGH (ref 70–99)
Potassium: 4 mEq/L (ref 3.5–5.1)
Sodium: 138 mEq/L (ref 135–145)
Total Bilirubin: 0.5 mg/dL (ref 0.2–1.2)
Total Protein: 7.4 g/dL (ref 6.0–8.3)

## 2022-02-27 LAB — HEMOGLOBIN A1C: Hgb A1c MFr Bld: 7.4 % — ABNORMAL HIGH (ref 4.6–6.5)

## 2022-02-27 MED ORDER — TELMISARTAN 80 MG PO TABS: 80.0000 mg | ORAL_TABLET | Freq: Every day | ORAL | 3 refills | Status: DC

## 2022-02-27 NOTE — Patient Instructions (Signed)
Welcome to [facility] at [place]! It was a pleasure meeting you today.  As discussed, Please schedule a 6 month follow up visit today.  PLEASE NOTE:  If you had any LAB tests please let us know if you have not heard back within a few days. You may see your results on MyChart before we have a chance to review them but we will give you a call once they are reviewed by us. If we ordered any REFERRALS today, please let us know if you have not heard from their office within the next week.  Let us know through MyChart if you are needing REFILLS, or have your pharmacy send us the request. You can also use MyChart to communicate with me or any office staff.  Please try these tips to maintain a healthy lifestyle:  Eat most of your calories during the day when you are active. Eliminate processed foods including packaged sweets (pies, cakes, cookies), reduce intake of potatoes, white bread, white pasta, and white rice. Look for whole grain options, oat flour or almond flour.  Each meal should contain half fruits/vegetables, one quarter protein, and one quarter carbs (no bigger than a computer mouse).  Cut down on sweet beverages. This includes juice, soda, and sweet tea. Also watch fruit intake, though this is a healthier sweet option, it still contains natural sugar! Limit to 3 servings daily.  Drink at least 1 glass of water with each meal and aim for at least 8 glasses per day  Exercise at least 150 minutes every week.   

## 2022-02-27 NOTE — Progress Notes (Signed)
New Patient Office Visit  Subjective:  Patient ID: Danielle Payne, female    DOB: 1946-01-11  Age: 76 y.o. MRN: [id number]  CC: here w/husb.  Goes between here and [employer]. Chief Complaint  Patient presents with   Establish Care    Need to establish with PCP Not fasting   Was going to [facility] Dr. Percell Miller.  Changing doctors     Admitted 03/2021 for CVA-speech and R weakness.  Has afib.  Got tPA. Seeing neuro-reviewed note 01/31/22.(Was off coumadin).  Some diff getting words out and correct. In speech therapy. Can do ADL's.  On eliquis.    2.  DM type 2-Sugar 170 this am-metformin didn't work in past so on amaryl only.  Ophth 2/20.  On ARB 3.  HTN-doing better on meds.  No ha/dizziness/cp 4.  Afib/CAD-seeing Card.  Stable.  HPI Danielle Payne presents for new pt  Past Medical History:  Diagnosis Date   Allergy    Asthma    Atrial fibrillation ([place])    Chronic combined systolic and diastolic heart failure ([place]) 05/14/2018   Coronary artery disease    Diabetes mellitus without complication ([place])    Diverticulitis    Diverticulitis    Hypertension    Pure hypercholesterolemia 05/14/2018   Stented coronary artery    Stroke [facility])     Past Surgical History:  Procedure Laterality Date   ABDOMINAL HYSTERECTOMY     partial   CARDIAC CATHETERIZATION     CARDIOVERSION N/A 12/21/2014   Procedure: CARDIOVERSION;  Surgeon: Adrian Prows, MD;  Location: [facility] ENDOSCOPY;  Service: Cardiovascular;  Laterality: N/A;   CORONARY ANGIOPLASTY     CORONARY ARTERY BYPASS GRAFT      Family History  Problem Relation Age of Onset   CAD Mother    Stroke Mother    Hypertension Mother    Prostate cancer Father    Diabetes Mellitus II Sister    Diabetes Mellitus II Brother    CAD Maternal Aunt     Social History   Socioeconomic History   Marital status: Married    Spouse name: Not on file   Number of children: 5   Years of education: Not on file   Highest education level: Not  on file  Occupational History   Not on file  Tobacco Use   Smoking status: Never   Smokeless tobacco: Never  Vaping Use   Vaping Use: Never used  Substance and Sexual Activity   Alcohol use: No   Drug use: No   Sexual activity: Yes    Birth control/protection: None  Other Topics Concern   Not on file  Social History Narrative   5 children.  11 grands   Social Determinants of Radio broadcast assistant Strain: Not on file  Food Insecurity: Not on file  Transportation Needs: Not on file  Physical Activity: Not on file  Stress: Not on file  Social Connections: Not on file  Intimate Partner Violence: Not on file    ROS  ROS: Gen: no fever, chills  Skin: no rash, itching ENT: no ear pain, ear drainage, nasal congestion, rhinorrhea, sinus pressure, sore throat.  Gland L throat intermitt-allergies in spring.- resolved now Eyes: no blurry vision, double vision Resp: no cough, wheeze,SOB CV: no CP, palpitations, LE edema,  GI: no heartburn, n/v/d/c, abd pain GU: no dysuria, urgency, frequency, hematuria MSK: no joint pain, myalgias, back pain Neuro: no dizziness, headache, weakness, vertigo Psych: no depression, anxiety, insomnia, SI  Objective:   Today's Vitals: BP 130/70   Pulse 80   Temp 98.2 F (36.8 C) (Temporal)   Ht '5\' 3"'$  (1.6 m)   Wt 169 lb (76.7 kg)   SpO2 97%   BMI 29.94 kg/m   Physical Exam  Gen: WDWN NAD wf HEENT: NCAT, conjunctiva not injected, sclera nonicteric TM WNL B, OP moist, no exudates  NECK:  supple, no thyromegaly, no nodes, no carotid bruits CARDIAC: irRRR, S1S2+, no murmur. DP palp B LUNGS: CTAB. No wheezes ABDOMEN:  BS+, soft, NTND, No HSM, no masses EXT:  no edema MSK: no gross abnormalities.  NEURO: A&O x3.  CN II-XII intact. Dysarthria, mixing up words.  Follows directions PSYCH: normal mood. Good eye contact   Assessment & Plan:   Problem List Items Addressed This Visit       Cardiovascular and Mediastinum   Atrial  fibrillation (HCC)   Relevant Medications   amLODipine (NORVASC) 5 MG tablet   nitroGLYCERIN (NITROSTAT) 0.4 MG SL tablet   carvedilol (COREG) 25 MG tablet   telmisartan (MICARDIS) 80 MG tablet   Other Relevant Orders   CBC with Differential/Platelet (Completed)   Essential hypertension   Relevant Medications   amLODipine (NORVASC) 5 MG tablet   nitroGLYCERIN (NITROSTAT) 0.4 MG SL tablet   carvedilol (COREG) 25 MG tablet   telmisartan (MICARDIS) 80 MG tablet     Endocrine   Type 2 diabetes mellitus without complications (HCC) - Primary   Relevant Medications   glimepiride (AMARYL) 2 MG tablet   telmisartan (MICARDIS) 80 MG tablet   Other Relevant Orders   Comprehensive metabolic panel (Completed)   Hemoglobin A1c (Completed)   Lipid panel (Completed)   CBC with Differential/Platelet (Completed)   Microalbumin / creatinine urine ratio     Other   Pure hypercholesterolemia   Relevant Medications   amLODipine (NORVASC) 5 MG tablet   nitroGLYCERIN (NITROSTAT) 0.4 MG SL tablet   carvedilol (COREG) 25 MG tablet   telmisartan (MICARDIS) 80 MG tablet   Other Relevant Orders   Lipid panel (Completed)   History of cardioembolic cerebrovascular accident (CVA)  1.  Diabetes type 2 with CAD-chronic.  Uncontrolled with hyperglycemia.  Check CMP, A1c, urine microalbumin creatinine.  Continue glimepiride 2 mg daily.  Await labs 2.  Hypertension-chronic.  Well-controlled.  Continue amlodipine 5 mg, carvedilol 25 mg, telmisartan 80 mg (renewed today).  Check CBC, CMP 3.  Hyperlipidemia-chronic.  Unsure of control.  After patient left, in review of medications, noted she is not on a statin.  Check lipids.  Due to CAD and CVA, patient needs to take a statin. 4.  A-fib/CAD-chronic.  Controlled on medications.  Continue Eliquis 5 mg twice daily, carvedilol 25 mg.  Care per cardiology 5.  History of CVA-mainly affecting speech currently.  Patient is currently in speech therapy.  Continue Eliquis.   Continue antihypertensives.  Goal is blood pressure less than 130/80.  Outpatient Encounter Medications as of 02/27/2022  Medication Sig   amLODipine (NORVASC) 5 MG tablet Take by mouth.   apixaban (ELIQUIS) 5 MG TABS tablet Take 1 tablet (5 mg total) by mouth 2 (two) times daily.   carvedilol (COREG) 25 MG tablet Take by mouth.   famotidine (PEPCID) 10 MG tablet Take 10 mg by mouth 2 (two) times daily.   glimepiride (AMARYL) 2 MG tablet Take 2 mg by mouth.   MAGNESIUM OXIDE PO Take 1 tablet by mouth daily.   Menaquinone-7 (VITAMIN K2 PO) Take by mouth.  nitroGLYCERIN (NITROSTAT) 0.4 MG SL tablet Place under the tongue.   [DISCONTINUED] telmisartan (MICARDIS) 80 MG tablet Take 1 tablet by mouth daily.   telmisartan (MICARDIS) 80 MG tablet Take 1 tablet (80 mg total) by mouth daily.   [DISCONTINUED] acetaminophen (TYLENOL) 325 MG tablet Take 2 tablets (650 mg total) by mouth every 4 (four) hours as needed for mild pain (or temp > 37.5 C (99.5 F)).   [DISCONTINUED] albuterol (VENTOLIN HFA) 108 (90 Base) MCG/ACT inhaler INHALE TWO PUFFS INTO THE LUNGS EVERY 4 HOURS AS NEEDED FOR WHEEZING OR SHORTNESS OF BREATH   [DISCONTINUED] atorvastatin (LIPITOR) 10 MG tablet Take 2 tablets (20 mg total) by mouth daily.   [DISCONTINUED] carvedilol (COREG) 25 MG tablet Take 1 tablet (25 mg total) by mouth 2 (two) times daily with a meal.   [DISCONTINUED] diltiazem (CARDIZEM) 10 mg/ml oral suspension Take 6 mLs (60 mg total) by mouth every 6 (six) hours.   [DISCONTINUED] food thickener (SIMPLYTHICK, HONEY/LEVEL 3/MODERATELY THICK,) LIQD Take 1 packet by mouth as needed.   [DISCONTINUED] glimepiride (AMARYL) 4 MG tablet Take 4 mg by mouth daily with breakfast.   [DISCONTINUED] hydrALAZINE (APRESOLINE) 10 MG tablet Take 1 tablet (10 mg total) by mouth every 8 (eight) hours.   [DISCONTINUED] insulin aspart (NOVOLOG) 100 UNIT/ML injection Inject 0-20 Units into the skin every 4 (four) hours.   [DISCONTINUED] insulin  detemir (LEVEMIR) 100 UNIT/ML injection Inject 0.15 mLs (15 Units total) into the skin daily.   [DISCONTINUED] lisinopril (ZESTRIL) 20 MG tablet Take 2 tablets (40 mg total) by mouth daily.   [DISCONTINUED] Melatonin 5 MG TBDP Take 5 mg by mouth at bedtime.    [DISCONTINUED] metFORMIN (GLUCOPHAGE) 500 MG tablet Take 1 tablet (500 mg total) by mouth 2 (two) times daily with a meal.   [DISCONTINUED] metoprolol tartrate (LOPRESSOR) 5 MG/5ML SOLN injection Inject 5 mLs (5 mg total) into the vein every 6 (six) hours as needed (HR > 120).   [DISCONTINUED] pantoprazole sodium (PROTONIX) 40 mg/20 mL PACK Take 20 mLs (40 mg total) by mouth 2 (two) times daily.   [DISCONTINUED] potassium chloride 10 MEQ/100ML Inject 100 mLs (10 mEq total) into the vein every 1 hour x 4 doses.   [DISCONTINUED] predniSONE (STERAPRED UNI-PAK 21 TAB) 10 MG (21) TBPK tablet taper   [DISCONTINUED] predniSONE (STERAPRED UNI-PAK 21 TAB) 10 MG (21) TBPK tablet taper   [DISCONTINUED] predniSONE (STERAPRED UNI-PAK 21 TAB) 10 MG (21) TBPK tablet taper   [DISCONTINUED] QUEtiapine (SEROQUEL) 25 MG tablet Take 1 tablet (25 mg total) by mouth at bedtime.   No facility-administered encounter medications on file as of 02/27/2022.    Follow-up: Return in about 6 months (around 08/30/2022) for dm,htn.   Wellington Hampshire, MD

## 2022-03-01 LAB — MICROALBUMIN / CREATININE URINE RATIO
Creatinine,U: 104.9 mg/dL
Microalb Creat Ratio: 1.1 mg/g (ref 0.0–30.0)
Microalb, Ur: 1.2 mg/dL (ref 0.0–1.9)

## 2022-03-02 ENCOUNTER — Other Ambulatory Visit: Payer: Self-pay | Admitting: *Deleted

## 2022-03-02 ENCOUNTER — Telehealth: Payer: Self-pay | Admitting: Family Medicine

## 2022-03-02 ENCOUNTER — Other Ambulatory Visit: Payer: Self-pay | Admitting: Family Medicine

## 2022-03-02 MED ORDER — ROSUVASTATIN CALCIUM 20 MG PO TABS: 20.0000 mg | ORAL_TABLET | Freq: Every day | ORAL | 3 refills | Status: DC

## 2022-03-02 MED ORDER — ACCU-CHEK AVIVA PLUS VI STRP: ORAL_STRIP | 3 refills | Status: AC

## 2022-03-02 NOTE — Telephone Encounter (Signed)
.   Encourage patient to contact the pharmacy for refills or they can request refills through [place]:  02/27/22  NEXT APPOINTMENT DATE:  MEDICATION:glucose blood (ACCU-CHEK AVIVA PLUS) test strip  Is the patient out of medication?   PHARMACY: [facility] [id number] - [place], [place] - [address] Phone:  [phone]  Fax:  [phone]      Let patient know to contact pharmacy at the end of the day to make sure medication is ready.  Please notify patient to allow 48-72 hours to process

## 2022-03-02 NOTE — Telephone Encounter (Signed)
Rx sent to the pharmacy.

## 2022-04-04 ENCOUNTER — Telehealth: Payer: Self-pay | Admitting: Family Medicine

## 2022-04-04 NOTE — Telephone Encounter (Signed)
Copied from [place] [id number]. Topic: Medicare AWV >> Apr 04, 2022  2:09 PM Devoria Glassing wrote: Reason for CRM: Left message for patient to schedule Annual Wellness Visit.  Please schedule with Nurse Health Advisor Charlott Rakes, RN at [facility].  Please call [phone] ask for [facility]

## 2022-04-18 ENCOUNTER — Telehealth: Payer: Self-pay | Admitting: Family Medicine

## 2022-04-18 NOTE — Telephone Encounter (Signed)
Copied from [place] [id number]. Topic: Medicare AWV >> Apr 18, 2022  2:13 PM Devoria Glassing wrote: Reason for CRM: Left message for patient to schedule Annual Wellness Visit.  Please schedule with Nurse Health Advisor Charlott Rakes, RN at [facility]. This appt can be telephone or office visit. Please call [phone] ask for [facility]

## 2022-04-24 ENCOUNTER — Ambulatory Visit (INDEPENDENT_AMBULATORY_CARE_PROVIDER_SITE_OTHER): Payer: Medicare Other | Admitting: Family Medicine

## 2022-04-24 ENCOUNTER — Encounter: Payer: Self-pay | Admitting: Family Medicine

## 2022-04-24 VITALS — BP 140/80 | HR 67 | Temp 98.3°F | Ht 63.0 in | Wt 171.2 lb

## 2022-04-24 DIAGNOSIS — K219 Gastro-esophageal reflux disease without esophagitis: Secondary | ICD-10-CM

## 2022-04-24 MED ORDER — OMEPRAZOLE 40 MG PO CPDR: 40.0000 mg | DELAYED_RELEASE_CAPSULE | Freq: Every day | ORAL | 3 refills | Status: DC

## 2022-04-24 NOTE — Patient Instructions (Signed)
Take the omeprazole for 1 month.   Mylanta/tums are ok to add if needed.  If worse, not improving, let me know.

## 2022-04-24 NOTE — Progress Notes (Signed)
Subjective:     Patient ID: Danielle Payne, female    DOB: 1946-06-12, 76 y.o.   MRN: [id number]  Chief Complaint  Patient presents with   Gastroesophageal Reflux    Acid reflux, worse at night     HPI-here w/husband Heartburn-having pain in chest and reflux-took mylanta and some tums-helped.  But then not getting regulary as husb ill. When gets up in am, notices more. No cough. No dysphagia.  No v/d.  Took some mg and helps.  Belches a lot whole life and flatus.    Doing more chores as husband ill.  No bleeing/dark stools  Health Maintenance Due  Topic Date Due   FOOT EXAM  Never done   Hepatitis C Screening  Never done   DEXA SCAN  Never done    Past Medical History:  Diagnosis Date   Allergy    Asthma    Atrial fibrillation ([place])    Chronic combined systolic and diastolic heart failure ([place]) 05/14/2018   Coronary artery disease    Diabetes mellitus without complication ([place])    Diverticulitis    Diverticulitis    Hypertension    Pure hypercholesterolemia 05/14/2018   Stented coronary artery    Stroke [facility])     Past Surgical History:  Procedure Laterality Date   ABDOMINAL HYSTERECTOMY     partial   CARDIAC CATHETERIZATION     CARDIOVERSION N/A 12/21/2014   Procedure: CARDIOVERSION;  Surgeon: Adrian Prows, MD;  Location: [place];  Service: Cardiovascular;  Laterality: N/A;   CORONARY ANGIOPLASTY     CORONARY ARTERY BYPASS GRAFT      Outpatient Medications Prior to Visit  Medication Sig Dispense Refill   albuterol (VENTOLIN HFA) 108 (90 Base) MCG/ACT inhaler 2 puffs as needed Inhalation every 4 hrs     amLODipine (NORVASC) 5 MG tablet Take by mouth.     apixaban (ELIQUIS) 5 MG TABS tablet Take 1 tablet (5 mg total) by mouth 2 (two) times daily. 60 tablet 0   carvedilol (COREG) 25 MG tablet Take by mouth.     famotidine (PEPCID) 10 MG tablet Take 10 mg by mouth 2 (two) times daily.     glimepiride (AMARYL) 2 MG tablet Take 2 mg by mouth.     glucose blood  (ACCU-CHEK AVIVA PLUS) test strip Use as instructed 100 each 3   MAGNESIUM OXIDE PO Take 1 tablet by mouth daily.     Menaquinone-7 (VITAMIN K2 PO) Take by mouth.     nitroGLYCERIN (NITROSTAT) 0.4 MG SL tablet Place under the tongue.     rosuvastatin (CRESTOR) 20 MG tablet Take 1 tablet (20 mg total) by mouth daily. 90 tablet 3   telmisartan (MICARDIS) 80 MG tablet Take 1 tablet (80 mg total) by mouth daily. 90 tablet 3   No facility-administered medications prior to visit.    Allergies  Allergen Reactions   Gadolinium Derivatives Rash   Lisinopril     Other reaction(s): Cough   ROS neg/noncontributory except as noted HPI/below      Objective:     BP 140/80   Pulse 67   Temp 98.3 F (36.8 C) (Temporal)   Ht '5\' 3"'$  (1.6 m)   Wt 171 lb 4 oz (77.7 kg)   SpO2 97%   BMI 30.34 kg/m  Wt Readings from Last 3 Encounters:  04/24/22 171 lb 4 oz (77.7 kg)  02/27/22 169 lb (76.7 kg)  03/29/21 200 lb 9.9 oz (91 kg)    Physical  Exam   Gen: WDWN NAD HEENT: NCAT, conjunctiva not injected, sclera nonicteric NECK:  supple, no thyromegaly, no nodes, no carotid bruits CARDIAC: iRRRR, S1S2+, no murmur.  LUNGS: CTAB. No wheezes ABDOMEN:  BS+, soft,sl tender mid epi, No HSM, no masses EXT:  no edema MSK: no gross abnormalities.  NEURO: A&O x3.  CN II-XII intact. Mixes up words PSYCH: normal mood. Good eye contact     Assessment & Plan:   Problem List Items Addressed This Visit   None Visit Diagnoses     Gastroesophageal reflux disease without esophagitis    -  Primary   Relevant Medications   omeprazole (PRILOSEC) 40 MG capsule      GERD-prilosec '40mg'$  daily for 1 mo.  Worse or not better, let me know.  Tums/mylanta if needed  Meds ordered this encounter  Medications   omeprazole (PRILOSEC) 40 MG capsule    Sig: Take 1 capsule (40 mg total) by mouth daily.    Dispense:  30 capsule    Refill:  3    Wellington Hampshire, MD

## 2022-05-14 ENCOUNTER — Telehealth: Payer: Self-pay | Admitting: Family Medicine

## 2022-05-14 NOTE — Telephone Encounter (Signed)
Eddie Dibbles notified and verbalized understanding.

## 2022-05-14 NOTE — Telephone Encounter (Signed)
Patient Name: Danielle Payne Gender: Female DOB: 05/01/1946 Age: 76 Y 9 M 21 D Return Phone Number: [phone] (Primary) Address: City/ State/ Zip: [place]  [address] Client [place] [facility] [place] Client Site [place] [facility] Contact Type Call Who Is Calling Patient / Member / Family / Caregiver Call Type Triage / Clinical Caller Name Eddie Dibbles Relationship To Patient Spouse Return Phone Number [phone] (Primary) Chief Complaint CHEST PAIN - pain, pressure, heaviness or tightness Reason for Call Symptomatic / Request for [place] states his wife has been experiencing chest pains. Translation No No Triage Reason Client directive prohibits Nurse Assessment Nurse: Jearld Pies, RN, Lovena Le Date/Time Eilene Ghazi Time): 05/14/2022 9:04:39 AM Confirm and document reason for call. If symptomatic, describe symptoms. ---Pt is not with caller at this time. Requested her cb # or a cb # to party she is currently with. Caller states "You can not call them. They are on the road. Does the patient have any new or worsening symptoms? ---Yes Will a triage be completed? ---No Select reason for no triage. ---Client directive prohibits Please document clinical information provided and list any resource used. ---Advised caller "Unable to triage and give medical advice without pt preset or on h line. Please call back when pt available." Caller verbalizes understanding. Disp. Time Eilene Ghazi Time) Disposition Final User 05/14/2022 9:02:28 AM Send to Urgent Zannie Kehr 05/14/2022 9:07:58 AM Clinical Call Yes Jearld Pies, RN, Lovena Le Final Disposition 05/14/2022 9:07:58 AM Clinical Call Yes Jearld Pies, RN, Donnel Saxon

## 2022-05-14 NOTE — Telephone Encounter (Signed)
FYI

## 2022-05-14 NOTE — Telephone Encounter (Signed)
Spoke with Eddie Dibbles and he stated that patient was complaining of tightness in chest earlier, but now she is fine. He stated that it seem like as soon as she took the vitamin K 2 she started complaining. Eddie Dibbles stated that he would not give patient the vitamin for a week and see if she still feeling better. Offered office visit for evaluation and he stated, "I will stop medication for a week and see how she does and if appt. is needed, I will call you back then. Eddie Dibbles advised if pain get worse to seek medical attention immediately.

## 2022-05-14 NOTE — Telephone Encounter (Signed)
Please follow up with patient.

## 2022-05-14 NOTE — Telephone Encounter (Signed)
Spouse called stating patient was having left side chest pain.    I have sent spouse to team health.

## 2022-06-19 ENCOUNTER — Encounter: Payer: Self-pay | Admitting: Family Medicine

## 2022-06-19 ENCOUNTER — Ambulatory Visit (INDEPENDENT_AMBULATORY_CARE_PROVIDER_SITE_OTHER): Payer: Medicare Other | Admitting: Family Medicine

## 2022-06-19 VITALS — BP 136/80 | HR 77 | Temp 98.1°F | Ht 63.0 in | Wt 172.2 lb

## 2022-06-19 DIAGNOSIS — Z8673 Personal history of transient ischemic attack (TIA), and cerebral infarction without residual deficits: Secondary | ICD-10-CM

## 2022-06-19 DIAGNOSIS — I482 Chronic atrial fibrillation, unspecified: Secondary | ICD-10-CM | POA: Diagnosis not present

## 2022-06-19 DIAGNOSIS — E119 Type 2 diabetes mellitus without complications: Secondary | ICD-10-CM | POA: Diagnosis not present

## 2022-06-19 DIAGNOSIS — I10 Essential (primary) hypertension: Secondary | ICD-10-CM | POA: Diagnosis not present

## 2022-06-19 DIAGNOSIS — Z1159 Encounter for screening for other viral diseases: Secondary | ICD-10-CM

## 2022-06-19 MED ORDER — GLIMEPIRIDE 4 MG PO TABS: 4.0000 mg | ORAL_TABLET | Freq: Every day | ORAL | 3 refills | Status: DC

## 2022-06-19 NOTE — Progress Notes (Signed)
Subjective:     Patient ID: Danielle Payne, female    DOB: 1945/12/30, 76 y.o.   MRN: [id number]  Chief Complaint  Patient presents with   Follow-up    Medication check, discuss glipizide increase due to elevated blood sugars    HPI-here w/husb  DM type 2-sugars were increasing so taking husb '4mg'$  glimiperide.  Running 130-150. Exercising.    HTN-Pt is on amlodipine '5mg'$ , coreg '25mg'$ ,micardis 80.  Bp's running  120's/60's-140/70-80.  No ha/dizziness/cp/palp/edema/cough/sob  Afib-chronic.  On eliquis bid.  No excessive bleeding CVA-doing so much better on speech. On crestor '20mg'$ .   Health Maintenance Due  Topic Date Due   Hepatitis C Screening  Never done   DEXA SCAN  Never done    Past Medical History:  Diagnosis Date   Allergy    Asthma    Atrial fibrillation ([place])    Chronic combined systolic and diastolic heart failure ([place] [employer]) 05/14/2018   Coronary artery disease    Diabetes mellitus without complication ([place])    Diverticulitis    Diverticulitis    Hypertension    Pure hypercholesterolemia 05/14/2018   Stented coronary artery    Stroke [facility])     Past Surgical History:  Procedure Laterality Date   ABDOMINAL HYSTERECTOMY     partial   CARDIAC CATHETERIZATION     CARDIOVERSION N/A 12/21/2014   Procedure: CARDIOVERSION;  Surgeon: Adrian Prows, MD;  Location: [place];  Service: Cardiovascular;  Laterality: N/A;   CORONARY ANGIOPLASTY     CORONARY ARTERY BYPASS GRAFT      Outpatient Medications Prior to Visit  Medication Sig Dispense Refill   albuterol (VENTOLIN HFA) 108 (90 Base) MCG/ACT inhaler 2 puffs as needed Inhalation every 4 hrs     amLODipine (NORVASC) 5 MG tablet Take by mouth.     apixaban (ELIQUIS) 5 MG TABS tablet Take 1 tablet (5 mg total) by mouth 2 (two) times daily. 60 tablet 0   carvedilol (COREG) 25 MG tablet Take by mouth.     famotidine (PEPCID) 10 MG tablet Take 10 mg by mouth 2 (two) times daily.     glucose blood (ACCU-CHEK AVIVA PLUS)  test strip Use as instructed 100 each 3   MAGNESIUM OXIDE PO Take 1 tablet by mouth daily.     Menaquinone-7 (VITAMIN K2 PO) Take by mouth.     nitroGLYCERIN (NITROSTAT) 0.4 MG SL tablet Place under the tongue.     omeprazole (PRILOSEC) 40 MG capsule Take 1 capsule (40 mg total) by mouth daily. 30 capsule 3   rosuvastatin (CRESTOR) 20 MG tablet Take 1 tablet (20 mg total) by mouth daily. 90 tablet 3   telmisartan (MICARDIS) 80 MG tablet Take 1 tablet (80 mg total) by mouth daily. 90 tablet 3   glimepiride (AMARYL) 2 MG tablet Take 2 mg by mouth daily.     No facility-administered medications prior to visit.    Allergies  Allergen Reactions   Gadolinium Derivatives Rash   Lisinopril     Other reaction(s): Cough   ROS neg/noncontributory except as noted HPI/below      Objective:     BP 136/80   Pulse 77   Temp 98.1 F (36.7 C) (Temporal)   Ht '5\' 3"'$  (1.6 m)   Wt 172 lb 4 oz (78.1 kg)   SpO2 97%   BMI 30.51 kg/m  Wt Readings from Last 3 Encounters:  06/19/22 172 lb 4 oz (78.1 kg)  04/24/22 171 lb 4 oz (77.7 kg)  02/27/22 169 lb (76.7 kg)    Physical Exam   Gen: WDWN NAD HEENT: NCAT, conjunctiva not injected, sclera nonicteric NECK:  supple, no thyromegaly, no nodes, no carotid bruits CARDIAC: irRRR, S1S2+, no murmur. DP 1+B LUNGS: CTAB. No wheezes ABDOMEN:  BS+, soft, NTND, No HSM, no masses EXT:  no edema MSK: no gross abnormalities.  NEURO: A&O x3.  CN II-XII intact. Speech much better! PSYCH: normal mood. Good eye contact  Diabetic Foot Exam - Simple   Simple Foot Form Diabetic Foot exam was performed with the following findings: Yes 06/19/2022  3:57 PM  Visual Inspection See comments: Yes Sensation Testing Intact to touch and monofilament testing bilaterally: Yes Pulse Check Posterior Tibialis and Dorsalis pulse intact bilaterally: Yes Comments Bunions L>R.  Fungal nails and tips.  Some callus on L bunion         Assessment & Plan:   Problem List  Items Addressed This Visit       Cardiovascular and Mediastinum   Atrial fibrillation ([place])   Relevant Orders   CBC with Differential/Platelet   Essential hypertension   Relevant Orders   CBC with Differential/Platelet     Endocrine   Type 2 diabetes mellitus without complications ([place]) - Primary   Relevant Medications   glimepiride (AMARYL) 4 MG tablet   Other Relevant Orders   Comprehensive metabolic panel   Hemoglobin A1c   Lipid panel     Other   History of cardioembolic cerebrovascular accident (CVA)   Other Visit Diagnoses     Encounter for hepatitis C screening test for low risk patient       Relevant Orders   Hepatitis C antibody     1.  Diabetes type 2-chronic.  Mostly controlled.  Husband has increased glimepiride to 4 mg daily as sugars were running in the 180s.  Check CBC, CMP, lipids.  Foot exam done.  Follow-up in 6 months 2.  Hypertension-chronic.  Controlled.  Continue amlodipine 5 mg carvedilol 25 mg and telmisartan 80 mg.  Check CBC, CMP.  Continue to monitor blood pressures 3.  Atrial fibrillation-chronic.  Rate controlled on carvedilol 25 mg daily.  Continue.  Continue Eliquis 5 mg twice daily.  Check CBC 4.  History of CVA-improved.  Patient's speech is much better, still needs a lot of work.  Is continuing with speech therapy and doing her home exercises.  Patient is on Crestor 20 mg.  Check lipids  Meds ordered this encounter  Medications   glimepiride (AMARYL) 4 MG tablet    Sig: Take 1 tablet (4 mg total) by mouth daily.    Dispense:  90 tablet    Refill:  3    Wellington Hampshire, MD

## 2022-06-19 NOTE — Patient Instructions (Signed)
It was very nice to see you today!  So glad doing better   PLEASE NOTE:  If you had any lab tests please let us know if you have not heard back within a few days. You may see your results on MyChart before we have a chance to review them but we will give you a call once they are reviewed by [place]. If we ordered any referrals today, please let us know if you have not heard from their office within the next week.   Please try these tips to maintain a healthy lifestyle:  Eat most of your calories during the day when you are active. Eliminate processed foods including packaged sweets (pies, cakes, cookies), reduce intake of potatoes, white bread, white pasta, and white rice. Look for whole grain options, oat flour or almond flour.  Each meal should contain half fruits/vegetables, one quarter protein, and one quarter carbs (no bigger than a computer mouse).  Cut down on sweet beverages. This includes juice, soda, and sweet tea. Also watch fruit intake, though this is a healthier sweet option, it still contains natural sugar! Limit to 3 servings daily.  Drink at least 1 glass of water with each meal and aim for at least 8 glasses per day  Exercise at least 150 minutes every week.

## 2022-06-20 LAB — LDL CHOLESTEROL, DIRECT: Direct LDL: 141 mg/dL

## 2022-06-20 LAB — HEPATITIS C ANTIBODY: Hepatitis C Ab: NONREACTIVE

## 2022-06-20 LAB — CBC WITH DIFFERENTIAL/PLATELET
Basophils Absolute: 0.1 10*3/uL (ref 0.0–0.1)
Basophils Relative: 0.8 % (ref 0.0–3.0)
Eosinophils Absolute: 0.2 10*3/uL (ref 0.0–0.7)
Eosinophils Relative: 2.3 % (ref 0.0–5.0)
HCT: 40.9 % (ref 36.0–46.0)
Hemoglobin: 13.8 g/dL (ref 12.0–15.0)
Lymphocytes Relative: 32.2 % (ref 12.0–46.0)
Lymphs Abs: 2.7 10*3/uL (ref 0.7–4.0)
MCHC: 33.6 g/dL (ref 30.0–36.0)
MCV: 89.6 fl (ref 78.0–100.0)
Monocytes Absolute: 0.8 10*3/uL (ref 0.1–1.0)
Monocytes Relative: 9.1 % (ref 3.0–12.0)
Neutro Abs: 4.6 10*3/uL (ref 1.4–7.7)
Neutrophils Relative %: 55.6 % (ref 43.0–77.0)
Platelets: 221 10*3/uL (ref 150.0–400.0)
RBC: 4.56 Mil/uL (ref 3.87–5.11)
RDW: 13.8 % (ref 11.5–15.5)
WBC: 8.3 10*3/uL (ref 4.0–10.5)

## 2022-06-20 LAB — HEMOGLOBIN A1C: Hgb A1c MFr Bld: 7.9 % — ABNORMAL HIGH (ref 4.6–6.5)

## 2022-06-20 LAB — COMPREHENSIVE METABOLIC PANEL
ALT: 15 U/L (ref 0–35)
AST: 13 U/L (ref 0–37)
Albumin: 3.9 g/dL (ref 3.5–5.2)
Alkaline Phosphatase: 94 U/L (ref 39–117)
BUN: 25 mg/dL — ABNORMAL HIGH (ref 6–23)
CO2: 25 mEq/L (ref 19–32)
Calcium: 9.6 mg/dL (ref 8.4–10.5)
Chloride: 103 mEq/L (ref 96–112)
Creatinine, Ser: 1.07 mg/dL (ref 0.40–1.20)
GFR: 49.96 mL/min — ABNORMAL LOW (ref >60.00)
Glucose, Bld: 176 mg/dL — ABNORMAL HIGH (ref 70–99)
Potassium: 4.1 mEq/L (ref 3.5–5.1)
Sodium: 139 mEq/L (ref 135–145)
Total Bilirubin: 0.4 mg/dL (ref 0.2–1.2)
Total Protein: 7.2 g/dL (ref 6.0–8.3)

## 2022-06-20 LAB — LIPID PANEL
Cholesterol: 194 mg/dL (ref 0–200)
HDL: 32.5 mg/dL — ABNORMAL LOW (ref >39.00)
NonHDL: 161.07
Total CHOL/HDL Ratio: 6
Triglycerides: 272 mg/dL — ABNORMAL HIGH (ref 0.0–149.0)
VLDL: 54.4 mg/dL — ABNORMAL HIGH (ref 0.0–40.0)

## 2022-06-21 NOTE — Progress Notes (Signed)
Cholesterol is too high!  Is she taking the crestor?  If yes, increase to '40mg'$  daily.   Sugars higher.  Husband increased glimiperide to '4mg'$ .  Let me know if continues to be high as we will need to add something else as well.

## 2022-06-27 ENCOUNTER — Telehealth: Payer: Self-pay | Admitting: Family Medicine

## 2022-06-27 ENCOUNTER — Other Ambulatory Visit: Payer: Self-pay | Admitting: *Deleted

## 2022-06-27 MED ORDER — ROSUVASTATIN CALCIUM 20 MG PO TABS: 20.0000 mg | ORAL_TABLET | Freq: Every day | ORAL | 3 refills | Status: DC

## 2022-06-27 NOTE — Telephone Encounter (Signed)
Returned call to [place], gave results and recommendations. Danielle Payne verbalized understanding.

## 2022-06-27 NOTE — Telephone Encounter (Signed)
Pt's spouse returning a call regarding labs. Asking for a call back

## 2022-08-15 ENCOUNTER — Telehealth: Payer: Self-pay | Admitting: Family Medicine

## 2022-08-15 NOTE — Telephone Encounter (Signed)
LVM  FOR PATIENT TO CALL [phone] TO SCHEDULE AWV WITH HEALTH COACH-TINA

## 2022-08-16 ENCOUNTER — Ambulatory Visit (INDEPENDENT_AMBULATORY_CARE_PROVIDER_SITE_OTHER): Payer: Medicare Other | Admitting: Family Medicine

## 2022-08-16 ENCOUNTER — Encounter: Payer: Self-pay | Admitting: Family Medicine

## 2022-08-16 VITALS — BP 140/80 | HR 76 | Temp 98.1°F | Ht 63.0 in | Wt 173.5 lb

## 2022-08-16 DIAGNOSIS — S8012XA Contusion of left lower leg, initial encounter: Secondary | ICD-10-CM | POA: Diagnosis not present

## 2022-08-16 NOTE — Progress Notes (Signed)
Subjective:     Patient ID: Danielle Payne, female    DOB: 1946/05/31, 76 y.o.   MRN: [id number]  Chief Complaint  Patient presents with   Mass    Bump on left leg after hitting leg with a board a week ago    HPI-here w/husband About 1 wk ago,hit L shin on a board.  No bleeding.  Now, still w/lump.  Slight tenderness.  No redness.  No f/u, no edema other than that one spot  Health Maintenance Due  Topic Date Due   Medicare Annual Wellness (AWV)  Never done   DEXA SCAN  Never done    Past Medical History:  Diagnosis Date   Allergy    Asthma    Atrial fibrillation (HCC)    Chronic combined systolic and diastolic heart failure ([place]) 05/14/2018   Coronary artery disease    Diabetes mellitus without complication ([place])    Diverticulitis    Diverticulitis    Hypertension    Pure hypercholesterolemia 05/14/2018   Stented coronary artery    Stroke [facility])     Past Surgical History:  Procedure Laterality Date   ABDOMINAL HYSTERECTOMY     partial   CARDIAC CATHETERIZATION     CARDIOVERSION N/A 12/21/2014   Procedure: CARDIOVERSION;  Surgeon: Adrian Prows, MD;  Location: [place];  Service: Cardiovascular;  Laterality: N/A;   CORONARY ANGIOPLASTY     CORONARY ARTERY BYPASS GRAFT      Outpatient Medications Prior to Visit  Medication Sig Dispense Refill   albuterol (VENTOLIN HFA) 108 (90 Base) MCG/ACT inhaler 2 puffs as needed Inhalation every 4 hrs     amLODipine (NORVASC) 5 MG tablet Take by mouth.     apixaban (ELIQUIS) 5 MG TABS tablet Take 1 tablet (5 mg total) by mouth 2 (two) times daily. 60 tablet 0   carvedilol (COREG) 25 MG tablet Take by mouth.     famotidine (PEPCID) 10 MG tablet Take 10 mg by mouth 2 (two) times daily.     glimepiride (AMARYL) 4 MG tablet Take 1 tablet (4 mg total) by mouth daily. 90 tablet 3   glucose blood (ACCU-CHEK AVIVA PLUS) test strip Use as instructed 100 each 3   MAGNESIUM OXIDE PO Take 1 tablet by mouth daily.     Menaquinone-7  (VITAMIN K2 PO) Take by mouth.     nitroGLYCERIN (NITROSTAT) 0.4 MG SL tablet Place under the tongue.     omeprazole (PRILOSEC) 40 MG capsule Take 1 capsule (40 mg total) by mouth daily. 30 capsule 3   rosuvastatin (CRESTOR) 20 MG tablet Take 1 tablet (20 mg total) by mouth daily. 90 tablet 3   telmisartan (MICARDIS) 80 MG tablet Take 1 tablet (80 mg total) by mouth daily. 90 tablet 3   No facility-administered medications prior to visit.    Allergies  Allergen Reactions   Gadolinium Derivatives Rash   Lisinopril     Other reaction(s): Cough   ROS neg/noncontributory except as noted HPI/below      Objective:     BP (!) 140/80   Pulse 76   Temp 98.1 F (36.7 C) (Temporal)   Ht '5\' 3"'$  (1.6 m)   Wt 173 lb 8 oz (78.7 kg)   SpO2 96%   BMI 30.73 kg/m  Wt Readings from Last 3 Encounters:  08/16/22 173 lb 8 oz (78.7 kg)  06/19/22 172 lb 4 oz (78.1 kg)  04/24/22 171 lb 4 oz (77.7 kg)    Physical Exam  Gen: WDWN NAD HEENT: NCAT, conjunctiva not injected, sclera nonicteric EXT:  no edema.  Left lower shin: Approximately 8 to 10 mm bruised, slightly swollen, nontender, nonfluctuant area.  No signs or symptoms of infection. MSK: no gross abnormalities.  NEURO: A&O x3.  CN II-XII intact.  Speech much better but still some mixed up words. PSYCH: normal mood. Good eye contact     Assessment & Plan:   Problem List Items Addressed This Visit   None Visit Diagnoses     Contusion of left lower leg, initial encounter    -  Primary     1.  Contusion left shin-no signs or symptoms of infection.  Discussed etiology of low blood supply, superficial location of bone.  Suspect small hematoma.  Given possibility of periosteal reaction.  Advised that needs a long time to resolve, and may have a permanent small hematoma present forever.  They are okay with this.  Continue to monitor  No orders of the defined types were placed in this encounter.   Wellington Hampshire, MD

## 2022-08-18 ENCOUNTER — Other Ambulatory Visit: Payer: Self-pay | Admitting: Family Medicine

## 2022-09-03 ENCOUNTER — Ambulatory Visit: Payer: Medicare Other | Admitting: Family Medicine

## 2022-09-28 ENCOUNTER — Ambulatory Visit (INDEPENDENT_AMBULATORY_CARE_PROVIDER_SITE_OTHER): Payer: Medicare Other

## 2022-09-28 VITALS — Wt 173.0 lb

## 2022-09-28 DIAGNOSIS — Z0000 Encounter for general adult medical examination without abnormal findings: Secondary | ICD-10-CM

## 2022-09-28 NOTE — Progress Notes (Signed)
I connected with  Danielle Payne on 09/28/22 by a audio enabled telemedicine application and verified that I am speaking with the correct person using two identifiers.along with husband Danielle Payne   Patient Location: Home  Provider Location: Office/Clinic  I discussed the limitations of evaluation and management by telemedicine. The patient expressed understanding and agreed to proceed.   Subjective:   Danielle Payne is a 76 y.o. female who presents for an Initial Medicare Annual Wellness Visit.  Review of Systems     Cardiac Risk Factors include: advanced age (>70mn, >>47women);obesity (BMI >30kg/m2);hypertension;diabetes mellitus     Objective:    Today's Vitals   09/28/22 1053  Weight: 173 lb (78.5 kg)   Body mass index is 30.65 kg/m.     09/28/2022   11:03 AM 04/02/2019    9:11 AM 11/20/2018    5:27 AM 12/21/2014   10:51 AM 11/23/2014    4:44 AM  Advanced Directives  Does Patient Have a Medical Advance Directive? Yes No No Yes No  Type of AParamedicof [place] will   [place] will   Copy of [place]? No - copy requested      Would patient like information on creating a medical advance directive?  Yes (ED - Information included in AVS) No - Patient declined  No - patient declined information    Current Medications (verified) Outpatient Encounter Medications as of 09/28/2022  Medication Sig   amLODipine (NORVASC) 5 MG tablet Take by mouth.   apixaban (ELIQUIS) 5 MG TABS tablet Take 1 tablet (5 mg total) by mouth 2 (two) times daily.   carvedilol (COREG) 25 MG tablet Take by mouth.   famotidine (PEPCID) 10 MG tablet Take 10 mg by mouth 2 (two) times daily.   glimepiride (AMARYL) 4 MG tablet Take 1 tablet (4 mg total) by mouth daily.   glucose blood (ACCU-CHEK AVIVA PLUS) test strip Use as instructed   MAGNESIUM OXIDE PO Take 1 tablet by mouth daily.   Menaquinone-7 (VITAMIN K2 PO) Take  by mouth.   omeprazole (PRILOSEC) 40 MG capsule TAKE 1 CAPSULE BY MOUTH DAILY   telmisartan (MICARDIS) 80 MG tablet Take 80 mg by mouth daily.   VITAMIN D PO Take by mouth.   [DISCONTINUED] albuterol (VENTOLIN HFA) 108 (90 Base) MCG/ACT inhaler 2 puffs as needed Inhalation every 4 hrs   [DISCONTINUED] insulin aspart (NOVOLOG) 100 UNIT/ML injection Inject 0-20 Units into the skin every 4 (four) hours.   [DISCONTINUED] insulin detemir (LEVEMIR) 100 UNIT/ML injection Inject 0.15 mLs (15 Units total) into the skin daily.   [DISCONTINUED] nitroGLYCERIN (NITROSTAT) 0.4 MG SL tablet Place under the tongue.   [DISCONTINUED] potassium chloride 10 MEQ/100ML Inject 100 mLs (10 mEq total) into the vein every 1 hour x 4 doses.   [DISCONTINUED] rosuvastatin (CRESTOR) 20 MG tablet Take 1 tablet (20 mg total) by mouth daily.   [DISCONTINUED] telmisartan (MICARDIS) 80 MG tablet Take 1 tablet (80 mg total) by mouth daily.   No facility-administered encounter medications on file as of 09/28/2022.    Allergies (verified) Gadolinium derivatives and Lisinopril   History: Past Medical History:  Diagnosis Date   Allergy    Asthma    Atrial fibrillation (HCC)    [place] 05[place]14[place]   Coronary artery disease    Diabetes mellitus without complication (HCC)    Diverticulitis    Diverticulitis    Hypertension  Pure hypercholesterolemia 05/14/2018   Stented coronary artery    Stroke [facility])    Past Surgical History:  Procedure Laterality Date   ABDOMINAL HYSTERECTOMY     partial   CARDIAC CATHETERIZATION     CARDIOVERSION N/A 12/21/2014   Procedure: CARDIOVERSION;  Surgeon: Adrian Prows, MD;  [facility] ENDOSCOPY;  Service: Cardiovascular;  Laterality: N/A;   CORONARY ANGIOPLASTY     CORONARY ARTERY BYPASS GRAFT     Family History  Problem Relation Age of Onset   CAD Mother    Stroke Mother    Hypertension Mother    Prostate cancer Father     Diabetes Mellitus II Sister    Diabetes Mellitus II Brother    CAD Maternal Aunt    Social History   Socioeconomic History   Marital status: Married    Spouse name: Not on file   Number of children: 5   Years of education: Not on file   Highest education level: Not on file  Occupational History   Not on file  Tobacco Use   Smoking status: Never   Smokeless tobacco: Never  Vaping Use   Vaping Use: Never used  Substance and Sexual Activity   Alcohol use: No   Drug use: No   Sexual activity: Yes    Birth control/protection: None  Other Topics Concern   Not on file  Social History Narrative   5 children.  11 grands   Social Determinants of Health   Financial Resource Strain: Low Risk  (09/28/2022)   Overall Financial Resource Strain (CARDIA)    Difficulty of Paying Living Expenses: Not hard at all  Food Insecurity: No Food Insecurity (09/28/2022)   Hunger Vital Sign    Worried About Running Out of Food in the Last Year: Never true    Ran Out of Food in the Last Year: Never true  Transportation Needs: No Transportation Needs (09/28/2022)   PRAPARE - Hydrologist (Medical): No    Lack of Transportation (Non-Medical): No  Physical Activity: Inactive (09/28/2022)   Exercise Vital Sign    Days of Exercise per Week: 0 days    Minutes of Exercise per Session: 0 min  Stress: No Stress Concern Present (09/28/2022)   [place]    Feeling of Stress : Not at all  Social Connections: Moderately Integrated (09/28/2022)   Social Connection and Isolation Panel [NHANES]    Frequency of Communication with Friends and Family: More than three times a week    Frequency of Social Gatherings with Friends and Family: More than three times a week    Attends Religious Services: More than 4 times per year    Active Member of [employer] or Organizations: No    Attends Music therapist: Never     Marital Status: Married    Tobacco Counseling Counseling given: Not Answered   Clinical Intake:  Pre-visit preparation completed: Yes  Pain : No/denies pain     BMI - recorded: 30.65 Nutritional Status: BMI > 30  Obese Nutritional Risks: None Diabetes: Yes CBG done?: No Did pt. bring in CBG monitor from home?: No  How often do you need to have someone help you when you read instructions, pamphlets, or other written materials from your doctor or pharmacy?: 1 - Never  Diabetic?Nutrition Risk Assessment:  Has the patient had any N/V/D within the last 2 months?  No  Does the patient have any non-healing  wounds?  No  Has the patient had any unintentional weight loss or weight gain?  No   Diabetes:  Is the patient diabetic?  Yes  If diabetic, was a CBG obtained today?  No  Did the patient bring in their glucometer from home?  No  How often do you monitor your CBG's? As needed .   Financial Strains and Diabetes Management:  Are you having any financial strains with the device, your supplies or your medication? No .  Does the patient want to be seen by Chronic Care Management for management of their diabetes?  No  Would the patient like to be referred to a Nutritionist or for Diabetic Management?  No   Diabetic Exams:  Diabetic Eye Exam: Completed 11/27/21 Diabetic Foot Exam: Completed 06/19/22   Interpreter Needed?: No  Information entered by :: Charlott Rakes, LPN   Activities of Daily Living    09/28/2022   11:04 AM  In your present state of health, do you have any difficulty performing the following activities:  Hearing? 0  Vision? 0  Difficulty concentrating or making decisions? 0  Walking or climbing stairs? 0  Dressing or bathing? 0  Doing errands, shopping? 0  Preparing Food and eating ? N  Using the Toilet? N  In the past six months, have you accidently leaked urine? N  Do you have problems with loss of bowel control? N  Managing your  Medications? N  Managing your Finances? N  Housekeeping or managing your Housekeeping? N    Patient Care Team: Tawnya Crook, MD as PCP - General (Family Medicine) Skeet Latch, MD as PCP - Cardiology (Cardiology)  Indicate any recent Medical Services you may have received from other than [facility] providers in the past year (date may be approximate).     Assessment:   This is a routine wellness examination for Danielle Payne.  Hearing/Vision screen Hearing Screening - Comments:: Pt denies any hearing issues  Vision Screening - Comments:: Pt follows up with provider for annual eye exams   Dietary issues and exercise activities discussed: Current Exercise Habits: The patient does not participate in regular exercise at present   Goals Addressed             This Visit's Progress    Patient Stated       None at this time       Depression Screen    09/28/2022   11:01 AM 02/27/2022   10:36 AM 11/18/2019   11:29 AM 04/08/2018   10:05 AM  PHQ 2/9 Scores  PHQ - 2 Score 0 0 0 0    Fall Risk    09/28/2022   11:04 AM 02/27/2022   10:36 AM 05/09/2020   12:37 PM 04/08/2018   10:05 AM  [place] in the past year? 0 0 0 No  Number falls in past yr: 0 0    Injury with Fall? 0 0    Risk for fall due to : Impaired vision No Fall Risks    Follow up Falls prevention discussed Falls evaluation completed      FALL RISK PREVENTION PERTAINING TO THE HOME:  Any stairs in or around the home? Yes  If so, are there any without handrails? No  Home free of loose throw rugs in walkways, pet beds, electrical cords, etc? Yes  Adequate lighting in your home to reduce risk of falls? Yes   ASSISTIVE DEVICES UTILIZED TO PREVENT FALLS:  Life alert? No  Use of a cane, walker or w/c? No  Grab bars in the bathroom? Yes  Shower chair or bench in shower? No  Elevated toilet seat or a handicapped toilet? No   TIMED UP AND GO:  Was the test performed? No .   Cognitive Function: pt not  able to complete per due to stroke         Immunizations Immunization History  Administered Date(s) Administered   Td 07/07/2008   Tdap 07/07/2018    TDAP status: Up to date  Flu Vaccine status: Declined, Education has been provided regarding the importance of this vaccine but patient still declined. Advised may receive this vaccine at local pharmacy or Health Dept. Aware to provide a copy of the vaccination record if obtained from local pharmacy or Health Dept. Verbalized acceptance and understanding.  Pneumococcal vaccine status: Declined,  Education has been provided regarding the importance of this vaccine but patient still declined. Advised may receive this vaccine at local pharmacy or Health Dept. Aware to provide a copy of the vaccination record if obtained from local pharmacy or Health Dept. Verbalized acceptance and understanding.   Covid-19 vaccine status: Declined, Education has been provided regarding the importance of this vaccine but patient still declined. Advised may receive this vaccine at local pharmacy or Health Dept.or vaccine clinic. Aware to provide a copy of the vaccination record if obtained from local pharmacy or Health Dept. Verbalized acceptance and understanding.  Qualifies for Shingles Vaccine? Yes   Zostavax completed No   Shingrix Completed?: No.    Education has been provided regarding the importance of this vaccine. Patient has been advised to call insurance company to determine out of pocket expense if they have not yet received this vaccine. Advised may also receive vaccine at local pharmacy or Health Dept. Verbalized acceptance and understanding.  Screening Tests Health Maintenance  Topic Date Due   Zoster Vaccines- Shingrix (1 of 2) Never done   DEXA SCAN  Never done   INFLUENZA VACCINE  01/06/2023 (Originally 05/08/2022)   COVID-19 Vaccine (1) 01/15/2023 (Originally 07/24/1951)   Pneumonia Vaccine 47+ Years old (1 - PCV) 02/28/2023 (Originally  07/24/2009)   OPHTHALMOLOGY EXAM  11/27/2022   HEMOGLOBIN A1C  12/18/2022   Diabetic kidney evaluation - Urine ACR  02/28/2023   Diabetic kidney evaluation - eGFR measurement  06/20/2023   FOOT EXAM  06/20/2023   Medicare Annual Wellness (AWV)  09/29/2023   DTaP/Tdap/Td (3 - Td or Tdap) 07/07/2028   Hepatitis C Screening  Completed   HPV VACCINES  Aged Out    Health Maintenance  Health Maintenance Due  Topic Date Due   Zoster Vaccines- Shingrix (1 of 2) Never done   DEXA SCAN  Never done    Colorectal cancer screening: No longer required.     Additional Screening:  Hepatitis C Screening:  Completed 06/19/22  Vision Screening: Recommended annual ophthalmology exams for early detection of glaucoma and other disorders of the eye. Is the patient up to date with their annual eye exam?  Yes  Who is the provider or what is the name of the office in which the patient attends annual eye exams? Unsure of provider name in [place]  If pt is not established with a provider, would they like to be referred to a provider to establish care? No .   Dental Screening: Recommended annual dental exams for proper oral hygiene  Community Resource Referral / Chronic Care Management: CRR required this visit?  No   CCM  required this visit?  No      Plan:     I have personally reviewed and noted the following in the patient's chart:   Medical and social history Use of alcohol, tobacco or illicit drugs  Current medications and supplements including opioid prescriptions. Patient is not currently taking opioid prescriptions. Functional ability and status Nutritional status Physical activity Advanced directives List of other physicians Hospitalizations, surgeries, and ER visits in previous 12 months Vitals Screenings to include cognitive, depression, and falls Referrals and appointments  In addition, I have reviewed and discussed with patient certain preventive protocols, quality metrics,  and best practice recommendations. A written personalized care plan for preventive services as well as general preventive health recommendations were provided to patient.     Willette Brace, LPN   [id number]   Nurse Notes: pt unable to complete 6 CIT at this time alert to name, was a little confused at date and time

## 2022-09-28 NOTE — Patient Instructions (Signed)
Danielle Payne , Thank you for taking time to come for your Medicare Wellness Visit. I appreciate your ongoing commitment to your health goals. Please review the following plan we discussed and let me know if I can assist you in the future.   These are the goals we discussed:  Goals      Patient Stated     None at this time        This is a list of the screening recommended for you and due dates:  Health Maintenance  Topic Date Due   Zoster (Shingles) Vaccine (1 of 2) Never done   DEXA scan (bone density measurement)  Never done   Flu Shot  01/06/2023*   COVID-19 Vaccine (1) 01/15/2023*   Pneumonia Vaccine (1 - PCV) 02/28/2023*   Eye exam for diabetics  11/27/2022   Hemoglobin A1C  12/18/2022   Yearly kidney health urinalysis for diabetes  02/28/2023   Yearly kidney function blood test for diabetes  06/20/2023   Complete foot exam   06/20/2023   Medicare Annual Wellness Visit  09/29/2023   DTaP/Tdap/Td vaccine (3 - Td or Tdap) 07/07/2028   Hepatitis C Screening: USPSTF Recommendation to screen - Ages 34-79 yo.  Completed   HPV Vaccine  Aged Out  *Topic was postponed. The date shown is not the original due date.    Advanced directives: Please bring a copy of your health care power of attorney and living will to the office at your convenience.  Conditions/risks identified: none at this time   Next appointment: Follow up in one year for your annual wellness visit    Preventive Care 65 Years and Older, Female Preventive care refers to lifestyle choices and visits with your health care provider that can promote health and wellness. What does preventive care include? A yearly physical exam. This is also called an annual well check. Dental exams once or twice a year. Routine eye exams. Ask your health care provider how often you should have your eyes checked. Personal lifestyle choices, including: Daily care of your teeth and gums. Regular physical activity. Eating a healthy  diet. Avoiding tobacco and drug use. Limiting alcohol use. Practicing safe sex. Taking low-dose aspirin every day. Taking vitamin and mineral supplements as recommended by your health care provider. What happens during an annual well check? The services and screenings done by your health care provider during your annual well check will depend on your age, overall health, lifestyle risk factors, and family history of disease. Counseling  Your health care provider may ask you questions about your: Alcohol use. Tobacco use. Drug use. Emotional well-being. Home and relationship well-being. Sexual activity. Eating habits. History of falls. Memory and ability to understand (cognition). Work and work Statistician. Reproductive health. Screening  You may have the following tests or measurements: Height, weight, and BMI. Blood pressure. Lipid and cholesterol levels. These may be checked every 5 years, or more frequently if you are over 1 years old. Skin check. Lung cancer screening. You may have this screening every year starting at age 67 if you have a 30-pack-year history of smoking and currently smoke or have quit within the past 15 years. Fecal occult blood test (FOBT) of the stool. You may have this test every year starting at age 52. Flexible sigmoidoscopy or colonoscopy. You may have a sigmoidoscopy every 5 years or a colonoscopy every 10 years starting at age 65. Hepatitis C blood test. Hepatitis B blood test. Sexually transmitted disease (STD) testing. Diabetes  screening. This is done by checking your blood sugar (glucose) after you have not eaten for a while (fasting). You may have this done every 1-3 years. Bone density scan. This is done to screen for osteoporosis. You may have this done starting at age 43. Mammogram. This may be done every 1-2 years. Talk to your health care provider about how often you should have regular mammograms. Talk with your health care provider about  your test results, treatment options, and if necessary, the need for more tests. Vaccines  Your health care provider may recommend certain vaccines, such as: Influenza vaccine. This is recommended every year. Tetanus, diphtheria, and acellular pertussis (Tdap, Td) vaccine. You may need a Td booster every 10 years. Zoster vaccine. You may need this after age 13. Pneumococcal 13-valent conjugate (PCV13) vaccine. One dose is recommended after age 18. Pneumococcal polysaccharide (PPSV23) vaccine. One dose is recommended after age 61. Talk to your health care provider about which screenings and vaccines you need and how often you need them. This information is not intended to replace advice given to you by your health care provider. Make sure you discuss any questions you have with your health care provider. Document Released: 10/21/2015 Document Revised: 06/13/2016 Document Reviewed: 07/26/2015 Elsevier Interactive Patient Education  2017 Major Prevention in the Home Falls can cause injuries. They can happen to people of all ages. There are many things you can do to make your home safe and to help prevent falls. What can I do on the outside of my home? Regularly fix the edges of walkways and driveways and fix any cracks. Remove anything that might make you trip as you walk through a door, such as a raised step or threshold. Trim any bushes or trees on the path to your home. Use bright outdoor lighting. Clear any walking paths of anything that might make someone trip, such as rocks or tools. Regularly check to see if handrails are loose or broken. Make sure that both sides of any steps have handrails. Any raised decks and porches should have guardrails on the edges. Have any leaves, snow, or ice cleared regularly. Use sand or salt on walking paths during winter. Clean up any spills in your garage right away. This includes oil or grease spills. What can I do in the bathroom? Use  night lights. Install grab bars by the toilet and in the tub and shower. Do not use towel bars as grab bars. Use non-skid mats or decals in the tub or shower. If you need to sit down in the shower, use a plastic, non-slip stool. Keep the floor dry. Clean up any water that spills on the floor as soon as it happens. Remove soap buildup in the tub or shower regularly. Attach bath mats securely with double-sided non-slip rug tape. Do not have throw rugs and other things on the floor that can make you trip. What can I do in the bedroom? Use night lights. Make sure that you have a light by your bed that is easy to reach. Do not use any sheets or blankets that are too big for your bed. They should not hang down onto the floor. Have a firm chair that has side arms. You can use this for support while you get dressed. Do not have throw rugs and other things on the floor that can make you trip. What can I do in the kitchen? Clean up any spills right away. Avoid walking on wet floors. Keep  items that you use a lot in easy-to-reach places. If you need to reach something above you, use a strong step stool that has a grab bar. Keep electrical cords out of the way. Do not use floor polish or wax that makes floors slippery. If you must use wax, use non-skid floor wax. Do not have throw rugs and other things on the floor that can make you trip. What can I do with my stairs? Do not leave any items on the stairs. Make sure that there are handrails on both sides of the stairs and use them. Fix handrails that are broken or loose. Make sure that handrails are as long as the stairways. Check any carpeting to make sure that it is firmly attached to the stairs. Fix any carpet that is loose or worn. Avoid having throw rugs at the top or bottom of the stairs. If you do have throw rugs, attach them to the floor with carpet tape. Make sure that you have a light switch at the top of the stairs and the bottom of the  stairs. If you do not have them, ask someone to add them for you. What else can I do to help prevent falls? Wear shoes that: Do not have high heels. Have rubber bottoms. Are comfortable and fit you well. Are closed at the toe. Do not wear sandals. If you use a stepladder: Make sure that it is fully opened. Do not climb a closed stepladder. Make sure that both sides of the stepladder are locked into place. Ask someone to hold it for you, if possible. Clearly mark and make sure that you can see: Any grab bars or handrails. First and last steps. Where the edge of each step is. Use tools that help you move around (mobility aids) if they are needed. These include: Canes. Walkers. Scooters. Crutches. Turn on the lights when you go into a dark area. Replace any light bulbs as soon as they burn out. Set up your furniture so you have a clear path. Avoid moving your furniture around. If any of your floors are uneven, fix them. If there are any pets around you, be aware of where they are. Review your medicines with your doctor. Some medicines can make you feel dizzy. This can increase your chance of falling. Ask your doctor what other things that you can do to help prevent falls. This information is not intended to replace advice given to you by your health care provider. Make sure you discuss any questions you have with your health care provider. Document Released: 07/21/2009 Document Revised: 03/01/2016 Document Reviewed: 10/29/2014 Elsevier Interactive Patient Education  2017 [employer].

## 2022-12-03 ENCOUNTER — Other Ambulatory Visit: Payer: Self-pay | Admitting: Family Medicine

## 2022-12-03 MED ORDER — AMLODIPINE BESYLATE 5 MG PO TABS: 5.0000 mg | ORAL_TABLET | Freq: Every day | ORAL | 1 refills | Status: DC

## 2022-12-03 NOTE — Telephone Encounter (Signed)
Don't see that med was filled by PCP before (on list as historical entry), last OV was an acute appt on 08/16/22, please advise

## 2022-12-03 NOTE — Telephone Encounter (Signed)
  LAST APPOINTMENT DATE:  08/16/22  NEXT APPOINTMENT DATE: None  MEDICATION: amLODipine (NORVASC) 5 MG tablet   Is the patient out of medication? Yes  PHARMACY:  [facility] [id number] [place], [place] - [place] [address] [place], [place] [address] Phone: [phone]  Fax: [phone]

## 2022-12-26 ENCOUNTER — Ambulatory Visit: Payer: Medicare Other | Admitting: Family Medicine

## 2023-01-17 ENCOUNTER — Ambulatory Visit: Payer: Medicare Other | Admitting: Family Medicine

## 2023-01-17 ENCOUNTER — Ambulatory Visit (INDEPENDENT_AMBULATORY_CARE_PROVIDER_SITE_OTHER): Payer: Medicare Other | Admitting: Family Medicine

## 2023-01-17 ENCOUNTER — Encounter: Payer: Self-pay | Admitting: Family Medicine

## 2023-01-17 VITALS — BP 128/80 | HR 80 | Temp 98.0°F | Ht 63.0 in | Wt 176.2 lb

## 2023-01-17 DIAGNOSIS — Z8673 Personal history of transient ischemic attack (TIA), and cerebral infarction without residual deficits: Secondary | ICD-10-CM

## 2023-01-17 DIAGNOSIS — I4811 Longstanding persistent atrial fibrillation: Secondary | ICD-10-CM | POA: Diagnosis not present

## 2023-01-17 DIAGNOSIS — E7800 Pure hypercholesterolemia, unspecified: Secondary | ICD-10-CM

## 2023-01-17 DIAGNOSIS — I10 Essential (primary) hypertension: Secondary | ICD-10-CM | POA: Diagnosis not present

## 2023-01-17 DIAGNOSIS — N182 Chronic kidney disease, stage 2 (mild): Secondary | ICD-10-CM

## 2023-01-17 DIAGNOSIS — R5383 Other fatigue: Secondary | ICD-10-CM

## 2023-01-17 DIAGNOSIS — E1122 Type 2 diabetes mellitus with diabetic chronic kidney disease: Secondary | ICD-10-CM | POA: Diagnosis not present

## 2023-01-17 LAB — LIPID PANEL
Cholesterol: 210 mg/dL — ABNORMAL HIGH (ref 0–200)
HDL: 36.2 mg/dL — ABNORMAL LOW (ref >39.00)
LDL Cholesterol: 139 mg/dL — ABNORMAL HIGH (ref 0–99)
NonHDL: 174.26
Total CHOL/HDL Ratio: 6
Triglycerides: 176 mg/dL — ABNORMAL HIGH (ref 0.0–149.0)
VLDL: 35.2 mg/dL (ref 0.0–40.0)

## 2023-01-17 LAB — CBC WITH DIFFERENTIAL/PLATELET
Basophils Absolute: 0.1 10*3/uL (ref 0.0–0.1)
Basophils Relative: 0.7 % (ref 0.0–3.0)
Eosinophils Absolute: 0.2 10*3/uL (ref 0.0–0.7)
Eosinophils Relative: 2.4 % (ref 0.0–5.0)
HCT: 42.8 % (ref 36.0–46.0)
Hemoglobin: 14.5 g/dL (ref 12.0–15.0)
Lymphocytes Relative: 24.8 % (ref 12.0–46.0)
Lymphs Abs: 2.4 10*3/uL (ref 0.7–4.0)
MCHC: 34 g/dL (ref 30.0–36.0)
MCV: 89.2 fl (ref 78.0–100.0)
Monocytes Absolute: 0.8 10*3/uL (ref 0.1–1.0)
Monocytes Relative: 7.9 % (ref 3.0–12.0)
Neutro Abs: 6.1 10*3/uL (ref 1.4–7.7)
Neutrophils Relative %: 64.2 % (ref 43.0–77.0)
Platelets: 244 10*3/uL (ref 150.0–400.0)
RBC: 4.8 Mil/uL (ref 3.87–5.11)
RDW: 13.6 % (ref 11.5–15.5)
WBC: 9.6 10*3/uL (ref 4.0–10.5)

## 2023-01-17 LAB — TSH: TSH: 0.51 u[IU]/mL (ref 0.35–5.50)

## 2023-01-17 LAB — COMPREHENSIVE METABOLIC PANEL
ALT: 17 U/L (ref 0–35)
AST: 12 U/L (ref 0–37)
Albumin: 4.1 g/dL (ref 3.5–5.2)
Alkaline Phosphatase: 93 U/L (ref 39–117)
BUN: 20 mg/dL (ref 6–23)
CO2: 23 mEq/L (ref 19–32)
Calcium: 9.2 mg/dL (ref 8.4–10.5)
Chloride: 104 mEq/L (ref 96–112)
Creatinine, Ser: 0.92 mg/dL (ref 0.40–1.20)
GFR: 59.65 mL/min — ABNORMAL LOW (ref >60.00)
Glucose, Bld: 188 mg/dL — ABNORMAL HIGH (ref 70–99)
Potassium: 3.9 mEq/L (ref 3.5–5.1)
Sodium: 137 mEq/L (ref 135–145)
Total Bilirubin: 0.7 mg/dL (ref 0.2–1.2)
Total Protein: 6.8 g/dL (ref 6.0–8.3)

## 2023-01-17 LAB — HEMOGLOBIN A1C: Hgb A1c MFr Bld: 8.8 % — ABNORMAL HIGH (ref 4.6–6.5)

## 2023-01-17 NOTE — Patient Instructions (Addendum)
It was very nice to see you today!  Get eye exam done and copy sent Check sugars different times of day-but just once/day   No more bean soup   PLEASE NOTE:  If you had any lab tests please let us know if you have not heard back within a few days. You may see your results on MyChart before we have a chance to review them but we will give you a call once they are reviewed by [place]. If we ordered any referrals today, please let us know if you have not heard from their office within the next week.   Please try these tips to maintain a healthy lifestyle:  Eat most of your calories during the day when you are active. Eliminate processed foods including packaged sweets (pies, cakes, cookies), reduce intake of potatoes, white bread, white pasta, and white rice. Look for whole grain options, oat flour or almond flour.  Each meal should contain half fruits/vegetables, one quarter protein, and one quarter carbs (no bigger than a computer mouse).  Cut down on sweet beverages. This includes juice, soda, and sweet tea. Also watch fruit intake, though this is a healthier sweet option, it still contains natural sugar! Limit to 3 servings daily.  Drink at least 1 glass of water with each meal and aim for at least 8 glasses per day  Exercise at least 150 minutes every week.

## 2023-01-17 NOTE — Progress Notes (Signed)
1.  Sugars are out of control as we discussed.  I know metformin did not do anything in the past, but with the glimepiride, it may now is he willing to start it?  If so 500 mg twice daily. 2.  Cholesterol is too high-I cannot recall why she is not on a statin-especially given that she had a stroke, she should be on Crestor 20 mg daily-please send in if he is willing.

## 2023-01-17 NOTE — Progress Notes (Signed)
Subjective:     Patient ID: Danielle Payne, female    DOB: [birth year], 77 y.o.   MRN: [id number]  Chief Complaint  Patient presents with   Medication follow-up    HPI-here w/husband.  Patient does chores fine now.  DM type 2-on glimepride 4 mg in am and 2 in eve.  Sugars 180.  HYPERTENSION-Pt is on micardis 80, coreg 25, norvasc 5mg .  Bp's running ok at home.  No ha/dizziness/cp/palp/edema/cough/sob Atrial fibrillation-on coreg 25 mg an eliquis.  No bleeding.  Did have dark stools but resolved CVA-speech much better.  Taking eliquis and crestor 20 mg Feeling tired.  Daughter made this appointment. Ate bean soup last night.  Stomach not doing well-nor was husband's. At times, naps.  Can complete activities.   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   Coronary artery disease    Diabetes mellitus without complication    Diverticulitis    Diverticulitis    Hypertension    Pure hypercholesterolemia 05/14/2018   Stented coronary artery    Stroke     Past Surgical History:  Procedure Laterality Date   ABDOMINAL HYSTERECTOMY     partial   CARDIAC CATHETERIZATION     CARDIOVERSION N/A 12/21/2014   Procedure: CARDIOVERSION;  Surgeon: Yates Decamp, MD;  Location: [facility];  Service: Cardiovascular;  Laterality: N/A;   CORONARY ANGIOPLASTY     CORONARY ARTERY BYPASS GRAFT      Outpatient Medications Prior to Visit  Medication Sig Dispense Refill   amLODipine (NORVASC) 5 MG tablet Take 1 tablet (5 mg total) by mouth daily. 90 tablet 1   apixaban (ELIQUIS) 5 MG TABS tablet Take 1 tablet (5 mg total) by mouth 2 (two) times daily. 60 tablet 0   carvedilol  (COREG) 25 MG tablet Take by mouth.     glimepiride (AMARYL) 1 MG tablet Take by mouth.     glimepiride (AMARYL) 4 MG tablet Take 1 tablet (4 mg total) by mouth daily. 90 tablet 3   glucose blood (ACCU-CHEK AVIVA PLUS) test strip Use as instructed 100 each 3   MAGNESIUM OXIDE PO Take 1 tablet by mouth daily.     telmisartan (MICARDIS) 80 MG tablet Take 80 mg by mouth daily.     VITAMIN D PO Take by mouth.     famotidine (PEPCID) 10 MG tablet Take 10 mg by mouth 2 (two) times daily.     Menaquinone-7 (VITAMIN K2 PO) Take by mouth.     omeprazole (PRILOSEC) 40 MG capsule TAKE 1 CAPSULE BY MOUTH DAILY 90 capsule 1   No facility-administered medications prior to visit.    Allergies  Allergen Reactions   Gadolinium Derivatives Rash   Lisinopril     Other reaction(s): Cough   ROS neg/noncontributory except as noted HPI/below Worries-nothing new      Objective:     BP 128/80 (BP Location: Right Arm, Patient Position: Sitting)   Pulse 80   Temp 98 F (36.7 C) (Temporal)   Ht 5\' 3"  (1.6 m)   Wt 176 lb 3.2 oz (79.9 kg)   SpO2 96%  BMI 31.21 kg/m  Wt Readings from Last 3 Encounters:  01/17/23 176 lb 3.2 oz (79.9 kg)  09/28/22 173 lb (78.5 kg)  08/16/22 173 lb 8 oz (78.7 kg)    Physical Exam   Gen: WDWN NAD HEENT: NCAT, conjunctiva not injected, sclera nonicteric NECK:  supple, no thyromegaly, no nodes, no carotid bruits CARDIAC: irRRR, S1S2+, no murmur. DP 1+B LUNGS: CTAB. No wheezes ABDOMEN:  BS+, soft, NTND, No HSM, no masses EXT:  no edema MSK: no gross abnormalities.  NEURO: A&O x3.  CN II-XII intact.  Speech-mixes up words frequently PSYCH: normal mood. Good eye contact     Assessment & Plan:   Problem List Items Addressed This Visit       Cardiovascular and Mediastinum   Atrial fibrillation    Chronic.  Rate controlled.  Continue coreg 25 mg, eliquis 5 mg twice daily.  Check TSH,CBC,cmp      Essential hypertension    Chronic.  Well controlled.  Continue  telmisartan 80 mg, coreg 25 mg, amlodipine 5 mg.  Check CBC,CMP,tsh        Endocrine   Diabetes mellitus with stage 2 chronic kidney disease - Primary    Chronic.  Well controlled.  Continue glimepiride 4 mg.  Check CMP, A1C      Relevant Medications   glimepiride (AMARYL) 1 MG tablet     Other   Pure hypercholesterolemia    Chronic.  Controlled.  Continue rosuvastatin 20 mg.  Check lipids, cmp      History of cardioembolic cerebrovascular accident (CVA)    Chronic.  Stable.  Continue eliquis.  Continue working on speech      Other Visit Diagnoses     Other fatigue         Fatigue-will include labs.  Check sugars different times of the day.   No orders of the defined types were placed in this encounter.   [name], MD

## 2023-01-17 NOTE — Assessment & Plan Note (Signed)
Chronic.  Rate controlled.  Continue coreg 25 mg, eliquis 5 mg twice daily.  Check TSH,CBC,cmp

## 2023-01-17 NOTE — Assessment & Plan Note (Signed)
Chronic.  Well controlled.  Continue telmisartan 80 mg, coreg 25 mg, amlodipine 5 mg.  Check CBC,CMP,tsh

## 2023-01-17 NOTE — Assessment & Plan Note (Signed)
Chronic.  Controlled.  Continue rosuvastatin 20 mg.  Check lipids, cmp

## 2023-01-17 NOTE — Assessment & Plan Note (Signed)
Chronic.  Stable.  Continue eliquis.  Continue working on speech

## 2023-01-17 NOTE — Assessment & Plan Note (Signed)
Chronic.  Well controlled.  Continue glimepiride 4 mg.  Check CMP, A1C

## 2023-01-18 ENCOUNTER — Other Ambulatory Visit: Payer: Self-pay | Admitting: *Deleted

## 2023-01-18 MED ORDER — ROSUVASTATIN CALCIUM 20 MG PO TABS: 20.0000 mg | ORAL_TABLET | Freq: Every day | ORAL | 3 refills | Status: DC

## 2023-01-18 MED ORDER — METFORMIN HCL 500 MG PO TABS: 500.0000 mg | ORAL_TABLET | Freq: Two times a day (BID) | ORAL | 3 refills | Status: DC

## 2023-01-24 ENCOUNTER — Telehealth: Payer: Self-pay | Admitting: Family Medicine

## 2023-01-24 NOTE — Telephone Encounter (Signed)
Caller states since medication change, pt has been increasingly fatigue. States she is currently on 3 medications for her blood sugar and doesn't know if this could be too much. Caller requests a callback @ [phone].

## 2023-01-25 NOTE — Telephone Encounter (Signed)
FYI. Please see below.  Pt's husband states pt has been feeling tired since she had open heart surgery in 2017, hx CHF. Denies any issues with medication. Per husband, nothing further needed.

## 2023-01-28 ENCOUNTER — Other Ambulatory Visit: Payer: Self-pay | Admitting: *Deleted

## 2023-01-28 ENCOUNTER — Telehealth: Payer: Self-pay | Admitting: Family Medicine

## 2023-01-28 MED ORDER — CARVEDILOL 25 MG PO TABS: 25.0000 mg | ORAL_TABLET | Freq: Two times a day (BID) | ORAL | 3 refills | Status: DC

## 2023-01-28 NOTE — Telephone Encounter (Signed)
Patient is taking twice a day. Rx sent to the pharmacy.

## 2023-01-28 NOTE — Telephone Encounter (Signed)
Prescription Request  01/28/2023  LOV: 01/17/2023  What is the name of the medication or equipment? carvedilol (COREG) 25 MG tablet   Have you contacted your pharmacy to request a refill? Yes   Which pharmacy would you like this sent to?    [facility] [id number] [place], [place] - [address] [place] [address] Phone: [phone] Fax: [phone]     Patient notified that their request is being sent to the clinical staff for review and that they should receive a response within 2 business days.   Please advise at [facility] [phone]

## 2023-02-05 ENCOUNTER — Other Ambulatory Visit: Payer: Self-pay | Admitting: Family Medicine

## 2023-02-05 MED ORDER — TELMISARTAN 80 MG PO TABS: 80.0000 mg | ORAL_TABLET | Freq: Every day | ORAL | 3 refills | Status: DC

## 2023-02-12 ENCOUNTER — Other Ambulatory Visit: Payer: Self-pay | Admitting: Family Medicine

## 2023-02-12 NOTE — Telephone Encounter (Signed)
Patient does not take medication anymore, auto refill from pharmacy per husband.

## 2023-02-23 ENCOUNTER — Emergency Department (HOSPITAL_BASED_OUTPATIENT_CLINIC_OR_DEPARTMENT_OTHER)
Admission: EM | Admit: 2023-02-23 | Discharge: 2023-02-23 | Disposition: A | Discharge status: Home / Self Care | Payer: Medicare Other | Attending: Emergency Medicine | Admitting: Emergency Medicine

## 2023-02-23 ENCOUNTER — Encounter (HOSPITAL_BASED_OUTPATIENT_CLINIC_OR_DEPARTMENT_OTHER): Payer: Self-pay

## 2023-02-23 ENCOUNTER — Other Ambulatory Visit: Payer: Self-pay

## 2023-02-23 ENCOUNTER — Emergency Department (HOSPITAL_BASED_OUTPATIENT_CLINIC_OR_DEPARTMENT_OTHER): Payer: Medicare Other

## 2023-02-23 DIAGNOSIS — Z794 Long term (current) use of insulin: Secondary | ICD-10-CM | POA: Diagnosis not present

## 2023-02-23 DIAGNOSIS — Z7901 Long term (current) use of anticoagulants: Secondary | ICD-10-CM | POA: Diagnosis not present

## 2023-02-23 DIAGNOSIS — R911 Solitary pulmonary nodule: Secondary | ICD-10-CM | POA: Insufficient documentation

## 2023-02-23 DIAGNOSIS — Z79899 Other long term (current) drug therapy: Secondary | ICD-10-CM | POA: Diagnosis not present

## 2023-02-23 DIAGNOSIS — R109 Unspecified abdominal pain: Secondary | ICD-10-CM | POA: Diagnosis present

## 2023-02-23 DIAGNOSIS — I10 Essential (primary) hypertension: Secondary | ICD-10-CM | POA: Insufficient documentation

## 2023-02-23 LAB — CBC
HCT: 40.8 % (ref 36.0–46.0)
Hemoglobin: 13.7 g/dL (ref 12.0–15.0)
MCH: 29.8 pg (ref 26.0–34.0)
MCHC: 33.6 g/dL (ref 30.0–36.0)
MCV: 88.9 fL (ref 80.0–100.0)
Platelets: 246 10*3/uL (ref 150–400)
RBC: 4.59 MIL/uL (ref 3.87–5.11)
RDW: 12.7 % (ref 11.5–15.5)
WBC: 10.6 10*3/uL — ABNORMAL HIGH (ref 4.0–10.5)
nRBC: 0 % (ref 0.0–0.2)

## 2023-02-23 LAB — COMPREHENSIVE METABOLIC PANEL
ALT: 21 U/L (ref 0–44)
AST: 20 U/L (ref 15–41)
Albumin: 4 g/dL (ref 3.5–5.0)
Alkaline Phosphatase: 84 U/L (ref 38–126)
Anion gap: 10 (ref 5–15)
BUN: 25 mg/dL — ABNORMAL HIGH (ref 8–23)
CO2: 22 mmol/L (ref 22–32)
Calcium: 9 mg/dL (ref 8.9–10.3)
Chloride: 103 mmol/L (ref 98–111)
Creatinine, Ser: 1.1 mg/dL — ABNORMAL HIGH (ref 0.44–1.00)
GFR, Estimated: 51 mL/min — ABNORMAL LOW (ref >60)
Glucose, Bld: 120 mg/dL — ABNORMAL HIGH (ref 70–99)
Potassium: 3.8 mmol/L (ref 3.5–5.1)
Sodium: 135 mmol/L (ref 135–145)
Total Bilirubin: 0.4 mg/dL (ref 0.3–1.2)
Total Protein: 7.5 g/dL (ref 6.5–8.1)

## 2023-02-23 LAB — URINALYSIS, ROUTINE W REFLEX MICROSCOPIC
Bilirubin Urine: NEGATIVE
Glucose, UA: NEGATIVE mg/dL
Hgb urine dipstick: NEGATIVE
Ketones, ur: NEGATIVE mg/dL
Leukocytes,Ua: NEGATIVE
Nitrite: NEGATIVE
Protein, ur: NEGATIVE mg/dL
Specific Gravity, Urine: <=1.005 (ref 1.005–1.030)
pH: 5.5 (ref 5.0–8.0)

## 2023-02-23 LAB — LIPASE, BLOOD: Lipase: 31 U/L (ref 11–51)

## 2023-02-23 MED ORDER — SODIUM CHLORIDE 0.9 % IV BOLUS (SEPSIS)
1000.0000 mL | Freq: Once | INTRAVENOUS | Status: AC
  Administered 2023-02-23: 1000 mL via INTRAVENOUS

## 2023-02-23 MED ORDER — IOHEXOL 350 MG/ML SOLN
100.0000 mL | Freq: Once | INTRAVENOUS | Status: AC | PRN
  Administered 2023-02-23: 100 mL via INTRAVENOUS

## 2023-02-23 MED ORDER — HYDROCODONE-ACETAMINOPHEN 5-325 MG PO TABS
1.0000 | ORAL_TABLET | Freq: Once | ORAL | Status: AC
  Administered 2023-02-23: 1 via ORAL
  Filled 2023-02-23: qty 1

## 2023-02-23 NOTE — ED Notes (Signed)
Pt has had her ct scan and is waiting for results, no complaints at this time.

## 2023-02-23 NOTE — ED Triage Notes (Signed)
Patient having abd pain and flank pain today. She denied fever or vomiting.

## 2023-02-23 NOTE — Discharge Instructions (Signed)
  You have been seen in the Emergency Department (ED) for abdominal pain. Your workup was generally reassuring; however, it did not identify a clear cause of your symptoms.  Continue using Tylenol as needed for pain control as well as alternate heat and ice.  Make sure to watch for any signs of rash or signs of shingles developing.  Your CT scan did show evidence of incidental pulmonary nodule.  See below for recommended repeat imaging in future for follow-up.  Please follow up with your primary care doctor as soon as possible regarding today's ED visit and the symptoms that are bothering you.  Return to the ED if your abdominal pain worsens or fails to improve, you develop bloody vomiting, bloody diarrhea, you are unable to tolerate fluids due to vomiting, fever greater than 101, or any other concerning symptoms.    6 mm right solid pulmonary nodule. Non-contrast chest CT at 6-12  months is recommended. If the nodule is stable at time of repeat CT,  then future CT at 18-24 months (from today's scan) is considered  optional for low-risk patients, but is recommended for high-risk  patients. This recommendation follows the consensus statement:  Guidelines for Management of Incidental Pulmonary Nodules Detected  on CT Images: From the Fleischner Society 2017; Radiology 2017;  284:228-243.

## 2023-02-23 NOTE — ED Provider Notes (Signed)
[facility] Provider Note   CSN: [id number] Arrival date & time: 02/23/23  1725     History  Chief Complaint  Patient presents with   Abdominal Pain    Quinnetta Maceachern is a 77 y.o. female.  With PMH of HTN, A-fib on Eliquis, history of CVA presenting with right upper quadrant and right flank pain since earlier today.   Abdominal Pain      Home Medications Prior to Admission medications   Medication Sig Start Date End Date Taking? Authorizing Provider  amLODipine (NORVASC) 5 MG tablet Take 1 tablet (5 mg total) by mouth daily. 12/03/22   Jeani Sow, MD  apixaban (ELIQUIS) 5 MG TABS tablet Take 1 tablet (5 mg total) by mouth 2 (two) times daily. 04/01/21   Metzger-Cihelka, Cristie Hem, NP  carvedilol (COREG) 25 MG tablet Take 1 tablet (25 mg total) by mouth 2 (two) times daily. 01/28/23   Jeani Sow, MD  glimepiride (AMARYL) 1 MG tablet Take by mouth. 04/14/19   [provider]  glimepiride (AMARYL) 4 MG tablet Take 1 tablet (4 mg total) by mouth daily. 06/19/22   Jeani Sow, MD  glucose blood (ACCU-CHEK AVIVA PLUS) test strip Use as instructed 03/02/22   Jeani Sow, MD  MAGNESIUM OXIDE PO Take 1 tablet by mouth daily.    [provider]  metFORMIN (GLUCOPHAGE) 500 MG tablet Take 1 tablet (500 mg total) by mouth 2 (two) times daily with a meal. 01/18/23   Jeani Sow, MD  rosuvastatin (CRESTOR) 20 MG tablet Take 1 tablet (20 mg total) by mouth daily. 01/18/23   Jeani Sow, MD  telmisartan (MICARDIS) 80 MG tablet Take 1 tablet (80 mg total) by mouth daily. 02/05/23   Jeani Sow, MD  VITAMIN D PO Take by mouth.    [provider]  insulin aspart (NOVOLOG) 100 UNIT/ML injection Inject 0-20 Units into the skin every 4 (four) hours. 03/31/21 04/01/21  Metzger-Cihelka, Cristie Hem, NP  insulin detemir (LEVEMIR) 100 UNIT/ML injection Inject 0.15 mLs (15 Units total) into the skin daily. 03/31/21  04/01/21  Metzger-Cihelka, Cristie Hem, NP  potassium chloride 10 MEQ/100ML Inject 100 mLs (10 mEq total) into the vein every 1 hour x 4 doses. 03/31/21 04/01/21  Metzger-Cihelka, Desiree, NP      Allergies    Gadolinium derivatives and Lisinopril    Review of Systems   Review of Systems  Gastrointestinal:  Positive for abdominal pain.    Physical Exam Updated Vital Signs BP (!) 182/96   Pulse 70   Temp 98.2 F (36.8 C) (Oral)   Resp 16   Ht 5\' 3"  (1.6 m)   Wt 79 kg   SpO2 98%   BMI 30.85 kg/m  Physical Exam  ED Results / Procedures / Treatments   Labs (all labs ordered are listed, but only abnormal results are displayed) Labs Reviewed  COMPREHENSIVE METABOLIC PANEL - Abnormal; Notable for the following components:      Result Value   Glucose, Bld 120 (*)    BUN 25 (*)    Creatinine, Ser 1.10 (*)    GFR, Estimated 51 (*)    All other components within normal limits  CBC - Abnormal; Notable for the following components:   WBC 10.6 (*)    All other components within normal limits  LIPASE, BLOOD  URINALYSIS, ROUTINE W REFLEX MICROSCOPIC    EKG EKG Interpretation  Date/Time:  Saturday Feb 23 2023  17:49:25 EDT Ventricular Rate:  73 PR Interval:    QRS Duration: 103 QT Interval:  364 QTC Calculation: 402 R Axis:   7 Text Interpretation: Atrial fibrillation Repol abnrm suggests ischemia, lateral leads No significant change since last tracing Confirmed by Vivien Rossetti ([id number]) on 02/23/2023 7:50:22 PM  Radiology No results found.  Procedures Procedures    Medications Ordered in ED Medications  HYDROcodone-acetaminophen (NORCO/VICODIN) 5-325 MG per tablet 1 tablet (has no administration in time range)  sodium chloride 0.9 % bolus 1,000 mL (has no administration in time range)  iohexol (OMNIPAQUE) 350 MG/ML injection 100 mL (100 mLs Intravenous Contrast Given 02/23/23 2029)    ED Course/ Medical Decision Making/ A&P                             Medical  Decision Making  Julieonna Burianek is a 77 y.o. female.  With PMH of HTN, A-fib on Eliquis, history of CVA presenting with right upper quadrant and right flank pain since earlier today.  Amount and/or Complexity of Data Reviewed Labs: ordered. Radiology: ordered.  Risk Prescription drug management.     Final Clinical Impression(s) / ED Diagnoses Final diagnoses:  None    Rx / DC Orders ED Discharge Orders     None

## 2023-02-27 ENCOUNTER — Encounter: Payer: Self-pay | Admitting: Family Medicine

## 2023-02-27 ENCOUNTER — Ambulatory Visit (INDEPENDENT_AMBULATORY_CARE_PROVIDER_SITE_OTHER): Payer: Medicare Other | Admitting: Family Medicine

## 2023-02-27 VITALS — BP 132/80 | HR 78 | Temp 98.3°F | Resp 18 | Ht 63.0 in | Wt 176.5 lb

## 2023-02-27 DIAGNOSIS — E119 Type 2 diabetes mellitus without complications: Secondary | ICD-10-CM | POA: Diagnosis not present

## 2023-02-27 DIAGNOSIS — Z7984 Long term (current) use of oral hypoglycemic drugs: Secondary | ICD-10-CM | POA: Diagnosis not present

## 2023-02-27 DIAGNOSIS — R1011 Right upper quadrant pain: Secondary | ICD-10-CM

## 2023-02-27 MED ORDER — GLIMEPIRIDE 4 MG PO TABS: 4.0000 mg | ORAL_TABLET | Freq: Two times a day (BID) | ORAL | 1 refills | Status: DC

## 2023-02-27 NOTE — Progress Notes (Signed)
Subjective:     Patient ID: Danielle Payne, female    DOB: [birth year], 77 y.o.   MRN: [id number]  Chief Complaint  Patient presents with   Follow-up    ED follow-up from 02/23/23    HPI-here w/husband  Seen in ER on 5/18 for abdomen/flank pain.   CT PULMONARY EMBOLISM negative, CT abdomen unremarkable, labs stable.    Incidental finding of 6mm pulmonary nodule-follow up 6 month(s) recommended.  Doing better.   No nausea and vomiting and diarrhea/constipation.. no dysuria, no rash.  Bad tooth-will see DDS.  2.  DM on glimiperide twice daily.  Sister(nurse)-said don't do metformin so not doing.  Sugars 180.   Health Maintenance Due  Topic Date Due   DEXA SCAN  Never done   OPHTHALMOLOGY EXAM  11/27/2022   Diabetic kidney evaluation - Urine ACR  02/28/2023    Past Medical History:  Diagnosis Date   Allergy    Asthma    Atrial fibrillation (HCC)    Chronic combined systolic and diastolic heart failure (HCC) 05/14/2018   Coronary artery disease    Diabetes mellitus without complication (HCC)    Diverticulitis    Diverticulitis    Hypertension    Pure hypercholesterolemia 05/14/2018   Stented coronary artery    Stroke [facility])     Past Surgical History:  Procedure Laterality Date   ABDOMINAL HYSTERECTOMY     partial   CARDIAC CATHETERIZATION     CARDIOVERSION N/A 12/21/2014   Procedure: CARDIOVERSION;  Surgeon: Yates Decamp, MD;  Location: [facility];  Service: Cardiovascular;  Laterality: N/A;   CORONARY ANGIOPLASTY     CORONARY ARTERY BYPASS GRAFT       Current Outpatient Medications:    amLODipine (NORVASC) 5 MG tablet, Take 1 tablet (5 mg total) by mouth daily., Disp: 90 tablet, Rfl: 1   apixaban (ELIQUIS) 5 MG TABS tablet, Take 1 tablet (5 mg total) by mouth 2 (two) times daily., Disp: 60 tablet, Rfl: 0   carvedilol (COREG) 25 MG tablet, Take 1 tablet (25 mg total) by mouth 2 (two) times daily., Disp: 180 tablet, Rfl: 3   glucose blood (ACCU-CHEK AVIVA PLUS) test  strip, Use as instructed, Disp: 100 each, Rfl: 3   MAGNESIUM OXIDE PO, Take 1 tablet by mouth daily., Disp: , Rfl:    rosuvastatin (CRESTOR) 20 MG tablet, Take 1 tablet (20 mg total) by mouth daily., Disp: 90 tablet, Rfl: 3   telmisartan (MICARDIS) 80 MG tablet, Take 1 tablet (80 mg total) by mouth daily., Disp: 90 tablet, Rfl: 3   VITAMIN D PO, Take by mouth., Disp: , Rfl:    glimepiride (AMARYL) 4 MG tablet, Take 1 tablet (4 mg total) by mouth 2 (two) times daily., Disp: 180 tablet, Rfl: 1  Allergies  Allergen Reactions   Gadolinium Derivatives Rash   Lisinopril     Other reaction(s): Cough   ROS neg/noncontributory except as noted HPI/below      Objective:     BP 132/80 (BP Location: Left Arm, Patient Position: Sitting, Cuff Size: Normal)   Pulse 78   Temp 98.3 F (36.8 C) (Temporal)   Resp 18   Ht 5\' 3"  (1.6 m)   Wt 176 lb 8 oz (80.1 kg)   SpO2 97%   BMI 31.27 kg/m  Wt Readings from Last 3 Encounters:  02/27/23 176 lb 8 oz (80.1 kg)  02/23/23 174 lb 2.6 oz (79 kg)  01/17/23 176 lb 3.2 oz (79.9 kg)  Physical Exam   Gen: WDWN NAD HEENT: NCAT, conjunctiva not injected, sclera nonicteric NECK:  supple, no thyromegaly, no nodes, no carotid bruits CARDIAC: irRRR, S1S2+, no murmur.  LUNGS: CTAB. No wheezes ABDOMEN:  BS+, soft, NTND, No HSM, no masses EXT:  no edema MSK: no gross abnormalities.  NEURO: A&O x3.  CN II-XII intact.  PSYCH: normal mood. Good eye contact  Reviewed emergency room records     Assessment & Plan:  RUQ pain  Type 2 diabetes mellitus without complication, without long-term current use of insulin (HCC)  Long term current use of oral hypoglycemic drug  Other orders -     Glimepiride; Take 1 tablet (4 mg total) by mouth 2 (two) times daily.  Dispense: 180 tablet; Refill: 1   RUQ pain-status post ER.  Resolved.  Workup negative DM type 2-chronic w/hyperglycemia-on glimiperide 4 mg twice daily-family declines metformin.  Doesn't want  other medications right now.  Work on diet.    Husb has pancreatic cancer and declining  [name], MD

## 2023-02-27 NOTE — Patient Instructions (Signed)
It was very nice to see you today!  Work-up in ER was negative.    PLEASE NOTE:  If you had any lab tests please let us know if you have not heard back within a few days. You may see your results on MyChart before we have a chance to review them but we will give you a call once they are reviewed by [place]. If we ordered any referrals today, please let us know if you have not heard from their office within the next week.   Please try these tips to maintain a healthy lifestyle:  Eat most of your calories during the day when you are active. Eliminate processed foods including packaged sweets (pies, cakes, cookies), reduce intake of potatoes, white bread, white pasta, and white rice. Look for whole grain options, oat flour or almond flour.  Each meal should contain half fruits/vegetables, one quarter protein, and one quarter carbs (no bigger than a computer mouse).  Cut down on sweet beverages. This includes juice, soda, and sweet tea. Also watch fruit intake, though this is a healthier sweet option, it still contains natural sugar! Limit to 3 servings daily.  Drink at least 1 glass of water with each meal and aim for at least 8 glasses per day  Exercise at least 150 minutes every week.

## 2023-03-11 ENCOUNTER — Telehealth: Payer: Self-pay | Admitting: Family Medicine

## 2023-03-11 ENCOUNTER — Other Ambulatory Visit: Payer: Self-pay

## 2023-03-11 DIAGNOSIS — R1011 Right upper quadrant pain: Secondary | ICD-10-CM

## 2023-03-11 NOTE — Telephone Encounter (Addendum)
Caller states: - Patient has been making a squeaking sound to describe ongoing abdominal pain  - States patient can feel something moving in abdomen as though her intestines are twisting  - Has been experiencing increased burping  - Would like to avoid another OV with pcp if needed since just seen on 5/22  Caller is requesting a referral to Dr. Gaynelle Adu at [facility] [place] [facility] for a second opinion.   [facility] Address: [address] [place], [place] [address] Phone: [phone]  Caller would like to be primary contact in regards to this.

## 2023-03-11 NOTE — Telephone Encounter (Signed)
Patient and patient's daughter Danielle Payne aware referral has been placed. Advised to contact [facility] later this week to schedule NP appt.

## 2023-03-19 ENCOUNTER — Other Ambulatory Visit: Payer: Self-pay | Admitting: Family Medicine

## 2023-03-19 ENCOUNTER — Telehealth: Payer: Self-pay | Admitting: Family Medicine

## 2023-03-19 DIAGNOSIS — E1122 Type 2 diabetes mellitus with diabetic chronic kidney disease: Secondary | ICD-10-CM

## 2023-03-19 DIAGNOSIS — E11649 Type 2 diabetes mellitus with hypoglycemia without coma: Secondary | ICD-10-CM

## 2023-03-19 MED ORDER — DEXCOM G7 RECEIVER DEVI
1.0000 | Freq: Once | 1 refills | Status: AC
Start: 2023-03-19 — End: 2023-03-19

## 2023-03-19 MED ORDER — DEXCOM G7 SENSOR MISC: 1.0000 | 11 refills | Status: DC

## 2023-03-19 NOTE — Telephone Encounter (Signed)
Please see message below

## 2023-03-19 NOTE — Telephone Encounter (Signed)
Daughter called and states pt needs [facility] changed to [facility] for both parents. Also, if we can send an RX for Diabetic testing that you can put on arm. Please advise.

## 2023-03-19 NOTE — Progress Notes (Signed)
Spoke w/daughter Vikki Ports. Patient not need Hospice-just husband. However, patient getting hypoglycemia at night so changed glimiperide to 1/2 tab in eve.  If takes metformin, abdomen pain.   Requesting CONTINUOUS GLUCOSE MONITOR-agree.

## 2023-05-21 ENCOUNTER — Other Ambulatory Visit: Payer: Self-pay | Admitting: Family Medicine

## 2023-07-08 ENCOUNTER — Telehealth: Payer: Self-pay | Admitting: *Deleted

## 2023-07-08 ENCOUNTER — Other Ambulatory Visit: Payer: Self-pay | Admitting: Family Medicine

## 2023-07-08 MED ORDER — MIRTAZAPINE 7.5 MG PO TABS: 7.5000 mg | ORAL_TABLET | Freq: Every day | ORAL | 0 refills | Status: DC

## 2023-07-08 NOTE — Telephone Encounter (Signed)
Patient's daughter, Vikki Ports called requesting some medication to help patient sleep at night that will not interfere with her other medication. Husband passed away on 08-05-23. Please advise.

## 2023-07-09 ENCOUNTER — Encounter: Payer: Self-pay | Admitting: *Deleted

## 2023-07-09 NOTE — Telephone Encounter (Signed)
Patient's daughter notified of message below.

## 2023-07-22 ENCOUNTER — Telehealth: Payer: Self-pay | Admitting: Family Medicine

## 2023-07-22 NOTE — Telephone Encounter (Signed)
Danielle Payne informed of message below and verbalized understanding. Scheduled patient for Wednesday, 07/24/23 at 2 pm per daughter's request.

## 2023-07-22 NOTE — Telephone Encounter (Signed)
Advised to go to ED  Patient Name First: Danielle Last: Payne Gender: Female DOB: 1946/06/22 Age: 77 Y 11 M 26 D Return Phone Number: [phone] (Primary) Address: City/ State/ Zip: [place]  [address] Client [facility] - Human resources officer [facility] [employer] Provider Ruthine Dose, Dewayne Hatch Contact Type Call Who Is Calling Patient / Member / Family / Caregiver Call Type Triage / Clinical Caller Name Williams Che Relationship To Patient Daughter Return Phone Number [phone] (Primary) Chief Complaint Blood Pressure High Reason for Call Symptomatic / Request for Health Information Initial Comment Caller states her mothers blood pressure 187/112 and wants to know if she can give medication. Translation No Nurse Assessment Nurse: Tollie Eth, RN, Dois Davenport Date/Time (Eastern Time): 07/20/2023 11:23:23 PM Confirm and document reason for call. If symptomatic, describe symptoms. ---Caller is the daughter and states her mothers blood pressure 187/112. She had a stroke a few years ago. It has been running a little high husband passed recently and she has been under a lot of stress. She takes 3 different medications for her BP. She feels tired. She is a little short of breath. Does the patient have any new or worsening symptoms? ---Yes Will a triage be completed? ---Yes Related visit to physician within the last 2 weeks? ---No Does the PT have any chronic conditions? (i.e. diabetes, asthma, this includes High risk factors for pregnancy, etc.) ---Yes List chronic conditions. ---hypertension stroke Is this a behavioral health or substance abuse call? ---No Guidelines Guideline Title Affirmed Question Affirmed Notes Nurse Date/Time (Eastern Time) Blood Pressure - High [1] Systolic BP >= 160 OR Diastolic >= 100 AND [2] cardiac (e.g., breathing Plummer, RN, Dois Davenport 07/20/2023 11:29:39 PM Guidelines Guideline Title  Affirmed Question Affirmed Notes Nurse Date/Time Lamount Cohen Time) difficulty, chest pain) or neurologic symptoms (e.g., new-onset blurred or double vision, unsteady gait) Disp. Time Lamount Cohen Time) Disposition Final User 07/20/2023 11:38:35 PM Go to ED Now Yes Tollie Eth, RN, Dois Davenport Final Disposition 07/20/2023 11:38:35 PM Go to ED Now Yes Tollie Eth, RN, Clydene Laming Disagree/Comply Comply Caller Understands Yes PreDisposition Did not know what to do Care Advice Given Per Guideline GO TO ED NOW: * You need to be seen in the Emergency Department. CARE ADVICE given per Blood Pressure - High (Adult) guideline. Comments User: Glory Rosebush, RN Date/Time Lamount Cohen Time): 07/20/2023 11:40:29 PM daughter said they were tired and she did not want to go to the ER unless they had to wanted me to call doctor to see if she could take an extra pills. Daughter took BP again and now it is 146/75 and patient is feeling fine. Daughter says she will just monitor and will call back if it goes back up. Referrals GO TO FACILITY UNDECIDED

## 2023-07-23 NOTE — Progress Notes (Signed)
Subjective:    Patient ID: Danielle Payne, female    DOB: [birth year], 77 y.o.   MRN: [id number]  No chief complaint on file.   HPI- {[id number]}  HTN - Managed with Amlodipine, Carvedilol, and Telmisartan. Patient's daughter called office on 10/14 concerned about her mother's blood pressure (at the time 187/112) due to hx of stroke and recent stress related to husband's passing. Endorsed fatigue and mild SOB. Daughter reported 146/75 BP on recheck, and decided to monitor until appointment.  *** - ***  *** - ***  *** - ***  *** - ***  *** - ***  Health Maintenance Due  Topic Date Due   Zoster Vaccines- Shingrix (1 of 2) Never done   Pneumonia Vaccine 53+ Years old (1 of 1 - PCV) Never done   DEXA SCAN  Never done   OPHTHALMOLOGY EXAM  11/27/2022   Diabetic kidney evaluation - Urine ACR  02/28/2023   INFLUENZA VACCINE  Never done   COVID-19 Vaccine (1 - 2023-24 season) Never done   FOOT EXAM  06/20/2023   HEMOGLOBIN A1C  07/19/2023    Past Medical History:  Diagnosis Date   Allergy    Asthma    Atrial fibrillation (HCC)    Chronic combined systolic and diastolic heart failure (HCC) 05/14/2018   Coronary artery disease    Diabetes mellitus without complication (HCC)    Diverticulitis    Diverticulitis    Hypertension    Pure hypercholesterolemia 05/14/2018   Stented coronary artery    Stroke [facility])     Past Surgical History:  Procedure Laterality Date   ABDOMINAL HYSTERECTOMY     partial   CARDIAC CATHETERIZATION     CARDIOVERSION N/A 12/21/2014   Procedure: CARDIOVERSION;  Surgeon: Yates Decamp, MD;  Location: [facility];  Service: Cardiovascular;  Laterality: N/A;   CORONARY ANGIOPLASTY     CORONARY ARTERY BYPASS GRAFT       Current Outpatient Medications:    amLODipine (NORVASC) 5 MG tablet, TAKE 1 TABLET BY MOUTH DAILY, Disp: 90 tablet, Rfl: 1   apixaban (ELIQUIS) 5 MG TABS tablet, Take 1 tablet (5 mg total) by mouth 2 (two) times daily., Disp: 60  tablet, Rfl: 0   carvedilol (COREG) 25 MG tablet, Take 1 tablet (25 mg total) by mouth 2 (two) times daily., Disp: 180 tablet, Rfl: 3   Continuous Glucose Sensor (DEXCOM G7 SENSOR) MISC, 1 each by Does not apply route every 14 (fourteen) days. Every 10 days, Disp: 3 each, Rfl: 11   glimepiride (AMARYL) 4 MG tablet, Take 1 tablet (4 mg total) by mouth 2 (two) times daily., Disp: 180 tablet, Rfl: 1   glucose blood (ACCU-CHEK AVIVA PLUS) test strip, Use as instructed, Disp: 100 each, Rfl: 3   MAGNESIUM OXIDE PO, Take 1 tablet by mouth daily., Disp: , Rfl:    mirtazapine (REMERON) 7.5 MG tablet, Take 1 tablet (7.5 mg total) by mouth at bedtime., Disp: 30 tablet, Rfl: 0   rosuvastatin (CRESTOR) 20 MG tablet, Take 1 tablet (20 mg total) by mouth daily., Disp: 90 tablet, Rfl: 3   telmisartan (MICARDIS) 80 MG tablet, Take 1 tablet (80 mg total) by mouth daily., Disp: 90 tablet, Rfl: 3   VITAMIN D PO, Take by mouth., Disp: , Rfl:   Allergies  Allergen Reactions   Gadolinium Derivatives Rash   Lisinopril     Other reaction(s): Cough   ROS neg/noncontributory except as noted HPI/below  Objective:  There were no vitals taken for  this visit. Wt Readings from Last 3 Encounters:  02/27/23 176 lb 8 oz (80.1 kg)  02/23/23 174 lb 2.6 oz (79 kg)  01/17/23 176 lb 3.2 oz (79.9 kg)   Physical Exam   Gen: WDWN NAD HEENT: NCAT, conjunctiva not injected, sclera nonicteric NECK:  supple, no thyromegaly, no nodes, no carotid bruits CARDIAC: RRR, S1S2+, no murmur. DP 2+B LUNGS: CTAB. No wheezes ABDOMEN:  BS+, soft, NTND, No HSM, no masses EXT:  no edema MSK: no gross abnormalities.  NEURO: A&O x3.  CN II-XII intact.  PSYCH: normal mood. Good eye contact Assessment & Plan:  There are no diagnoses linked to this encounter.  No follow-ups on file.         I,Emily Lagle,acting as a Neurosurgeon for [name], MD.,have documented all relevant documentation on the behalf of [name], MD,as directed by   [name], MD while in the presence of [name], MD.  I, [name], MD, have reviewed all documentation for this visit. The documentation on 07/23/23 for the exam, diagnosis, procedures, and orders are all accurate and complete. *** (refresh reminder)  Larey Brick

## 2023-07-24 ENCOUNTER — Ambulatory Visit (INDEPENDENT_AMBULATORY_CARE_PROVIDER_SITE_OTHER): Payer: Medicare Other | Admitting: Family Medicine

## 2023-07-24 ENCOUNTER — Encounter: Payer: Self-pay | Admitting: Family Medicine

## 2023-07-24 VITALS — BP 136/83 | HR 65 | Temp 97.8°F | Resp 18 | Ht 63.0 in | Wt 175.5 lb

## 2023-07-24 DIAGNOSIS — I2510 Atherosclerotic heart disease of native coronary artery without angina pectoris: Secondary | ICD-10-CM

## 2023-07-24 DIAGNOSIS — Z5320 Procedure and treatment not carried out because of patient's decision for unspecified reasons: Secondary | ICD-10-CM

## 2023-07-24 DIAGNOSIS — I480 Paroxysmal atrial fibrillation: Secondary | ICD-10-CM

## 2023-07-24 DIAGNOSIS — E119 Type 2 diabetes mellitus without complications: Secondary | ICD-10-CM

## 2023-07-24 DIAGNOSIS — Z8673 Personal history of transient ischemic attack (TIA), and cerebral infarction without residual deficits: Secondary | ICD-10-CM

## 2023-07-24 DIAGNOSIS — R5383 Other fatigue: Secondary | ICD-10-CM | POA: Diagnosis not present

## 2023-07-24 DIAGNOSIS — I10 Essential (primary) hypertension: Secondary | ICD-10-CM

## 2023-07-24 DIAGNOSIS — Z7984 Long term (current) use of oral hypoglycemic drugs: Secondary | ICD-10-CM

## 2023-07-24 MED ORDER — FREESTYLE LIBRE 3 PLUS SENSOR MISC
11 refills | Status: DC
Start: 2023-07-24 — End: 2023-09-13

## 2023-07-24 NOTE — Assessment & Plan Note (Signed)
Chronic.  Not well-controlled-hyperglycemia continue glimepiride 4 mg in the a.m. and 2 mg in the p.m.  Will check labs.  Will see if we can get freestyle Josephine Igo covered as it will give [place] a better guide if she is having highs and lows that are contributing to the fatigue.  We discussed that there is an over-the-counter version that is about $75 a month.  Also, showed her how to use GoodRx and was able to found a more affordable price for Dexcom as well.  She will figure out which 1 to do and let us know results.

## 2023-07-24 NOTE — Assessment & Plan Note (Signed)
Chronic.  Rate controlled.  Continue coreg 25 mg, eliquis 5 mg twice daily.  Check TSH,CBC,cmp

## 2023-07-24 NOTE — Patient Instructions (Signed)

## 2023-07-24 NOTE — Assessment & Plan Note (Signed)
Chronic.  Well controlled.  Continue Olmesartan 40 mg, coreg 25 mg, amlodipine 5 mg.

## 2023-07-24 NOTE — Assessment & Plan Note (Signed)
Chronic.  Stable.  Check labs.  Declines statins.

## 2023-07-24 NOTE — Assessment & Plan Note (Addendum)
Chronic.  Stable.  Continue eliquis.  Continue working on speech.  Declines statin

## 2023-07-25 LAB — CBC WITH DIFFERENTIAL/PLATELET
Basophils Absolute: 0.1 10*3/uL (ref 0.0–0.1)
Basophils Relative: 0.6 % (ref 0.0–3.0)
Eosinophils Absolute: 0.2 10*3/uL (ref 0.0–0.7)
Eosinophils Relative: 1.8 % (ref 0.0–5.0)
HCT: 44.5 % (ref 36.0–46.0)
Hemoglobin: 14.5 g/dL (ref 12.0–15.0)
Lymphocytes Relative: 30.4 % (ref 12.0–46.0)
Lymphs Abs: 2.7 10*3/uL (ref 0.7–4.0)
MCHC: 32.6 g/dL (ref 30.0–36.0)
MCV: 92.5 fL (ref 78.0–100.0)
Monocytes Absolute: 0.8 10*3/uL (ref 0.1–1.0)
Monocytes Relative: 8.8 % (ref 3.0–12.0)
Neutro Abs: 5.1 10*3/uL (ref 1.4–7.7)
Neutrophils Relative %: 58.4 % (ref 43.0–77.0)
Platelets: 233 10*3/uL (ref 150.0–400.0)
RBC: 4.81 Mil/uL (ref 3.87–5.11)
RDW: 13.4 % (ref 11.5–15.5)
WBC: 8.8 10*3/uL (ref 4.0–10.5)

## 2023-07-25 LAB — COMPREHENSIVE METABOLIC PANEL
ALT: 17 U/L (ref 0–35)
AST: 14 U/L (ref 0–37)
Albumin: 4.1 g/dL (ref 3.5–5.2)
Alkaline Phosphatase: 88 U/L (ref 39–117)
BUN: 23 mg/dL (ref 6–23)
CO2: 26 meq/L (ref 19–32)
Calcium: 10 mg/dL (ref 8.4–10.5)
Chloride: 100 meq/L (ref 96–112)
Creatinine, Ser: 0.94 mg/dL (ref 0.40–1.20)
GFR: 57.92 mL/min — ABNORMAL LOW (ref >60.00)
Glucose, Bld: 155 mg/dL — ABNORMAL HIGH (ref 70–99)
Potassium: 4.3 meq/L (ref 3.5–5.1)
Sodium: 137 meq/L (ref 135–145)
Total Bilirubin: 0.5 mg/dL (ref 0.2–1.2)
Total Protein: 7.5 g/dL (ref 6.0–8.3)

## 2023-07-25 LAB — TSH: TSH: 0.31 u[IU]/mL — ABNORMAL LOW (ref 0.35–5.50)

## 2023-07-25 LAB — IBC + FERRITIN
Ferritin: 157.7 ng/mL (ref 10.0–291.0)
Iron: 83 ug/dL (ref 42–145)
Saturation Ratios: 28.1 % (ref 20.0–50.0)
TIBC: 295.4 ug/dL (ref 250.0–450.0)
Transferrin: 211 mg/dL — ABNORMAL LOW (ref 212.0–360.0)

## 2023-07-25 LAB — HEMOGLOBIN A1C: Hgb A1c MFr Bld: 8.2 % — ABNORMAL HIGH (ref 4.6–6.5)

## 2023-07-25 LAB — VITAMIN B12: Vitamin B-12: 451 pg/mL (ref 211–911)

## 2023-07-25 LAB — MICROALBUMIN / CREATININE URINE RATIO
Creatinine,U: 24.8 mg/dL
Microalb Creat Ratio: 2.8 mg/g (ref 0.0–30.0)
Microalb, Ur: <0.7 mg/dL (ref 0.0–1.9)

## 2023-07-25 LAB — VITAMIN D 25 HYDROXY (VIT D DEFICIENCY, FRACTURES): VITD: 62.65 ng/mL (ref 30.00–100.00)

## 2023-07-25 NOTE — Progress Notes (Signed)
1.  Vitamin D a bit high.  Limit to 1000 international units/day 2.  Iron and B12 ok so no extra iron.  B12 is fine if she wants to take 3.  A1C is acceptable for age.  Leg me know what the continuous monitor shows 4.  Tsh(thyroid), may(or may not) be hyper.  Vitamins can scew the test..  so NO vitamins for 1 wk and repeat labs-TSH, FT4, FT3

## 2023-07-29 ENCOUNTER — Other Ambulatory Visit: Payer: Self-pay | Admitting: *Deleted

## 2023-07-29 DIAGNOSIS — R7989 Other specified abnormal findings of blood chemistry: Secondary | ICD-10-CM

## 2023-07-29 DIAGNOSIS — Z1329 Encounter for screening for other suspected endocrine disorder: Secondary | ICD-10-CM

## 2023-07-30 ENCOUNTER — Telehealth: Payer: Self-pay | Admitting: Family Medicine

## 2023-07-30 NOTE — Telephone Encounter (Signed)
Daughter of pt would like a call back with lab results.

## 2023-07-30 NOTE — Telephone Encounter (Signed)
Results and recommendations given to daughter, Vikki Ports, per [facility] with understanding. Lab orders placed, scheduled lab appt.

## 2023-08-02 ENCOUNTER — Telehealth: Payer: Self-pay | Admitting: Family Medicine

## 2023-08-02 NOTE — Telephone Encounter (Signed)
Daughter called and states she needs a letter stating pt can't live on her own. Please call daughter back.

## 2023-08-02 NOTE — Telephone Encounter (Signed)
Daughter stated that she need a letter stating that patient cannot live by herself. She stated she needed it for caretaker purposes and would like it mailed to address on file.

## 2023-08-05 ENCOUNTER — Encounter: Payer: Self-pay | Admitting: Family Medicine

## 2023-08-05 NOTE — Telephone Encounter (Signed)
Letter completed and mailed to address on file per daughter.

## 2023-08-13 ENCOUNTER — Other Ambulatory Visit: Payer: Self-pay | Admitting: Family Medicine

## 2023-08-15 ENCOUNTER — Other Ambulatory Visit (INDEPENDENT_AMBULATORY_CARE_PROVIDER_SITE_OTHER): Payer: Medicare Other

## 2023-08-15 DIAGNOSIS — Z1329 Encounter for screening for other suspected endocrine disorder: Secondary | ICD-10-CM | POA: Diagnosis not present

## 2023-08-15 LAB — T4, FREE: Free T4: 0.86 ng/dL (ref 0.60–1.60)

## 2023-08-15 LAB — T3, FREE: T3, Free: 3.4 pg/mL (ref 2.3–4.2)

## 2023-08-15 LAB — TSH: TSH: 0.32 u[IU]/mL — ABNORMAL LOW (ref 0.35–5.50)

## 2023-08-15 NOTE — Progress Notes (Signed)
Free T3 and T4 normal.  TSH suppressed.  Could be start of becoming hyper.  Will continue to monitor.  Make sure so thyroid meds or supplements

## 2023-08-29 ENCOUNTER — Telehealth: Payer: Self-pay | Admitting: Family Medicine

## 2023-08-29 NOTE — Telephone Encounter (Signed)
Please Advise

## 2023-08-29 NOTE — Telephone Encounter (Signed)
Patients daughter, Vikki Ports, would like someone to give her a call back regarding patients low TSH levels..  she is needing to know if there is anything she can be doing/giving her to help with this other than what she is doing.Gabriel Cirri [facility] AWV TEAM Direct Dial [phone]

## 2023-09-03 ENCOUNTER — Telehealth: Payer: Self-pay | Admitting: Family Medicine

## 2023-09-03 DIAGNOSIS — E119 Type 2 diabetes mellitus without complications: Secondary | ICD-10-CM

## 2023-09-03 NOTE — Telephone Encounter (Signed)
PT ALSO NEED A NEW HANDICAP PLACARD FORM TO BE FILLED.     Prescription Request  09/03/2023  LOV: 07/24/2023  What is the name of the medication or equipment?  Continuous Glucose Sensor (FREESTYLE LIBRE 3 PLUS SENSOR) MISC   Have you contacted your pharmacy to request a refill? No   Which pharmacy would you like this sent to? [id number], [place] - [address] AT [facility] [address] [phone]    Patient notified that their request is being sent to the clinical staff for review and that they should receive a response within 2 business days.   Please advise at Mobile [phone] (mobile)

## 2023-09-12 NOTE — Telephone Encounter (Signed)
Please complete. Caller requests this be mailed.

## 2023-09-12 NOTE — Telephone Encounter (Signed)
Patient's daughter states pt's TSH levels were low and that something was suppose to be given. Caller requests a call back.

## 2023-09-13 MED ORDER — FREESTYLE LIBRE 3 PLUS SENSOR MISC: 11 refills | Status: DC

## 2023-09-13 NOTE — Telephone Encounter (Signed)
Form completed and faxed to address on file.

## 2023-10-01 ENCOUNTER — Ambulatory Visit (INDEPENDENT_AMBULATORY_CARE_PROVIDER_SITE_OTHER): Payer: Medicare Other

## 2023-10-01 VITALS — Wt 185.0 lb

## 2023-10-01 DIAGNOSIS — Z0000 Encounter for general adult medical examination without abnormal findings: Secondary | ICD-10-CM

## 2023-10-01 NOTE — Patient Instructions (Signed)
Danielle Payne , Thank you for taking time to come for your Medicare Wellness Visit. I appreciate your ongoing commitment to your health goals. Please review the following plan we discussed and let me know if I can assist you in the future.   Referrals/Orders/Follow-Ups/Clinician Recommendations: Each day, aim for 6 glasses of water, plenty of protein in your diet and try to get up and walk/ stretch every hour for 5-10 minutes at a time.    This is a list of the screening recommended for you and due dates:  Health Maintenance  Topic Date Due   Zoster (Shingles) Vaccine (1 of 2) Never done   DEXA scan (bone density measurement)  Never done   COVID-19 Vaccine (1 - 2024-25 season) Never done   Pneumonia Vaccine (1 of 2 - PCV) 10/02/2023*   Eye exam for diabetics  11/13/2023*   Flu Shot  01/06/2024*   Hemoglobin A1C  01/22/2024   Yearly kidney function blood test for diabetes  07/23/2024   Yearly kidney health urinalysis for diabetes  07/23/2024   Complete foot exam   07/23/2024   Medicare Annual Wellness Visit  09/30/2024   DTaP/Tdap/Td vaccine (4 - Td or Tdap) 07/07/2028   Hepatitis C Screening  Completed   HPV Vaccine  Aged Out  *Topic was postponed. The date shown is not the original due date.    Advanced directives: (In Chart) A copy of your advanced directives are scanned into your chart should your provider ever need it.  Next Medicare Annual Wellness Visit scheduled for next year: Yes

## 2023-10-01 NOTE — Progress Notes (Signed)
Subjective:   Danielle Payne is a 77 y.o. female who presents for Medicare Annual (Subsequent) preventive examination.  Visit Complete: Virtual I connected with  Danielle Payne on 10/01/23 by a audio enabled telemedicine application and verified that I am speaking with the correct person using two identifiers. Daughter Danielle Payne completed AWV mom has aphasia   Patient Location: Home  Provider Location: Office/Clinic  I discussed the limitations of evaluation and management by telemedicine. The patient expressed understanding and agreed to proceed.  Vital Signs: Because this visit was a virtual/telehealth visit, some criteria may be missing or patient reported. Any vitals not documented were not able to be obtained and vitals that have been documented are patient reported.   Cardiac Risk Factors include: advanced age (>67men, >58 women);hypertension     Objective:    Today's Vitals   10/01/23 1028  Weight: 185 lb (83.9 kg)   Body mass index is 32.77 kg/m.     10/01/2023   10:39 AM 02/23/2023    5:43 PM 09/28/2022   11:03 AM 04/02/2019    9:11 AM 11/20/2018    5:27 AM 12/21/2014   10:51 AM 11/23/2014    4:44 AM  Advanced Directives  Does Patient Have a Medical Advance Directive? Yes No Yes No No Yes No  Type of Estate agent of [place];Living will  Healthcare Power of [place];Living will   Healthcare Power of [place];Living will   Does patient want to make changes to medical advance directive? No - Patient declined        Copy of Healthcare Power of Attorney in Chart? Yes - validated most recent copy scanned in chart (See row information)  No - copy requested      Would patient like information on creating a medical advance directive?  No - Patient declined  Yes (ED - Information included in AVS) No - Patient declined  No - patient declined information    Current Medications (verified) Outpatient Encounter Medications as of 10/01/2023  Medication Sig    amLODipine (NORVASC) 5 MG tablet TAKE 1 TABLET BY MOUTH DAILY (Patient taking differently: Take 5 mg by mouth at bedtime.)   apixaban (ELIQUIS) 5 MG TABS tablet Take 1 tablet (5 mg total) by mouth 2 (two) times daily.   carvedilol (COREG) 25 MG tablet Take 1 tablet (25 mg total) by mouth 2 (two) times daily.   Continuous Glucose Sensor (FREESTYLE LIBRE 3 PLUS SENSOR) MISC Change sensor every 15 days.   glimepiride (AMARYL) 4 MG tablet Take 1 tablet (4 mg total) by mouth 2 (two) times daily. (Patient taking differently: Take 2 mg by mouth 2 (two) times daily.)   glucose blood (ACCU-CHEK AVIVA PLUS) test strip Use as instructed   MAGNESIUM OXIDE PO Take 1 tablet by mouth daily.   olmesartan (BENICAR) 40 MG tablet Take 40 mg by mouth daily.   pantoprazole (PROTONIX) 40 MG tablet Take by mouth as needed.   Vitamin D-Vitamin K (K2 PLUS D3 PO) Take by mouth.   [DISCONTINUED] dicyclomine (BENTYL) 20 MG tablet Take by mouth.   [DISCONTINUED] insulin aspart (NOVOLOG) 100 UNIT/ML injection Inject 0-20 Units into the skin every 4 (four) hours.   [DISCONTINUED] insulin detemir (LEVEMIR) 100 UNIT/ML injection Inject 0.15 mLs (15 Units total) into the skin daily.   [DISCONTINUED] potassium chloride 10 MEQ/100ML Inject 100 mLs (10 mEq total) into the vein every 1 hour x 4 doses.   No facility-administered encounter medications on file as of 10/01/2023.  Allergies (verified) Gadolinium derivatives and Lisinopril   History: Past Medical History:  Diagnosis Date   Allergy    Asthma    Atrial fibrillation (HCC)    Chronic combined systolic and diastolic heart failure (HCC) 05/14/2018   Coronary artery disease    Diabetes mellitus without complication (HCC)    Diverticulitis    Diverticulitis    Hypertension    Pure hypercholesterolemia 05/14/2018   Stented coronary artery    Stroke [facility])    Past Surgical History:  Procedure Laterality Date   ABDOMINAL HYSTERECTOMY     partial   CARDIAC  CATHETERIZATION     CARDIOVERSION N/A 12/21/2014   Procedure: CARDIOVERSION;  Surgeon: Yates Decamp, MD;  Location: [facility] ENDOSCOPY;  Service: Cardiovascular;  Laterality: N/A;   CORONARY ANGIOPLASTY     CORONARY ARTERY BYPASS GRAFT     Family History  Problem Relation Age of Onset   CAD Mother    Stroke Mother    Hypertension Mother    Prostate cancer Father    Diabetes Mellitus II Sister    Diabetes Mellitus II Brother    CAD Maternal Aunt    Social History   Socioeconomic History   Marital status: Widowed    Spouse name: Not on file   Number of children: 5   Years of education: Not on file   Highest education level: Not on file  Occupational History   Not on file  Tobacco Use   Smoking status: Never   Smokeless tobacco: Never  Vaping Use   Vaping status: Never Used  Substance and Sexual Activity   Alcohol use: No   Drug use: No   Sexual activity: Yes    Birth control/protection: None  Other Topics Concern   Not on file  Social History Narrative   5 children.  11 grands   Social Drivers of Corporate investment banker Strain: Low Risk  (09/28/2022)   Overall Financial Resource Strain (CARDIA)    Difficulty of Paying Living Expenses: Not hard at all  Food Insecurity: No Food Insecurity (09/28/2022)   Hunger Vital Sign    Worried About Running Out of Food in the Last Year: Never true    Ran Out of Food in the Last Year: Never true  Transportation Needs: No Transportation Needs (09/28/2022)   PRAPARE - Administrator, Civil Service (Medical): No    Lack of Transportation (Non-Medical): No  Physical Activity: Inactive (10/01/2023)   Exercise Vital Sign    Days of Exercise per Week: 0 days    Minutes of Exercise per Session: 0 min  Stress: No Stress Concern Present (10/01/2023)   [employer] of Occupational Health - Occupational Stress Questionnaire    Feeling of Stress : Not at all  Social Connections: Moderately Isolated (10/01/2023)   Social  Connection and Isolation Panel [NHANES]    Frequency of Communication with Friends and Family: More than three times a week    Frequency of Social Gatherings with Friends and Family: More than three times a week    Attends Religious Services: More than 4 times per year    Active Member of [employer] or Organizations: No    Attends Banker Meetings: Never    Marital Status: Widowed    Tobacco Counseling Counseling given: Not Answered   Clinical Intake:  Pre-visit preparation completed: Yes  Pain : No/denies pain     BMI - recorded: 32.77 Nutritional Status: BMI > 30  Obese Nutritional Risks:  None Diabetes: Yes CBG done?: No Did pt. bring in CBG monitor from home?: No  How often do you need to have someone help you when you read instructions, pamphlets, or other written materials from your doctor or pharmacy?: 1 - Never  Interpreter Needed?: Yes (daughter completed AWV)  Comments: mom has aphasia   Activities of Daily Living    10/01/2023   10:32 AM  In your present state of health, do you have any difficulty performing the following activities:  Hearing? 0  Vision? 0  Difficulty concentrating or making decisions? 0  Walking or climbing stairs? 0  Dressing or bathing? 0  Doing errands, shopping? 0  Preparing Food and eating ? Y  Comment daughter  Using the Toilet? N  In the past six months, have you accidently leaked urine? Y  Comment depends at night liners in the day  Do you have problems with loss of bowel control? N  Managing your Medications? Y  Comment daughter  Managing your Finances? Y  Comment daughter  Housekeeping or managing your Housekeeping? Y  Comment daughter    Patient Care Team: Jeani Sow, MD as PCP - General (Family Medicine) Chilton Si, MD as PCP - Cardiology (Cardiology)  Indicate any recent Medical Services you may have received from other than [facility] providers in the past year (date may be approximate).      Assessment:   This is a routine wellness examination for Tahliyah.  Hearing/Vision screen Hearing Screening - Comments:: Pt denies any hearing issues  Vision Screening - Comments:: Pt follows up with provider for annual eye exams    Goals Addressed             This Visit's Progress    Patient Stated       Get blood sugar under control       Depression Screen    10/01/2023   10:38 AM 07/24/2023    2:06 PM 01/17/2023    8:21 AM 09/28/2022   11:01 AM 02/27/2022   10:36 AM 11/18/2019   11:29 AM 04/08/2018   10:05 AM  PHQ 2/9 Scores  PHQ - 2 Score 0 1 0 0 0 0 0  PHQ- 9 Score  7 1        Fall Risk    10/01/2023   10:58 AM 07/24/2023    2:05 PM 01/17/2023    8:18 AM 09/28/2022   11:04 AM 02/27/2022   10:36 AM  Fall Risk   Falls in the past year? 0 0 0 0 0  Number falls in past yr: 0 0 0 0 0  Injury with Fall? 0 0 0 0 0  Risk for fall due to : No Fall Risks No Fall Risks No Fall Risks Impaired vision No Fall Risks  Follow up Falls prevention discussed Falls evaluation completed Falls evaluation completed Falls prevention discussed Falls evaluation completed    MEDICARE RISK AT HOME: Medicare Risk at Home Any stairs in or around the home?: Yes If so, are there any without handrails?: No Home free of loose throw rugs in walkways, pet beds, electrical cords, etc?: Yes Adequate lighting in your home to reduce risk of falls?: Yes Life alert?: No Use of a cane, walker or w/c?: No Grab bars in the bathroom?: No Shower chair or bench in shower?: No Elevated toilet seat or a handicapped toilet?: No  TIMED UP AND GO:  Was the test performed?  No    Cognitive Function:  Immunizations Immunization History  Administered Date(s) Administered   Td 07/07/2008   Td (Adult),5 Lf Tetanus Toxid, Preservative Free 07/07/2008   Tdap 07/07/2018    TDAP status: Up to date  Flu Vaccine status: Declined, Education has been provided regarding the importance of this  vaccine but patient still declined. Advised may receive this vaccine at local pharmacy or Health Dept. Aware to provide a copy of the vaccination record if obtained from local pharmacy or Health Dept. Verbalized acceptance and understanding.  Pneumococcal vaccine status: Declined,  Education has been provided regarding the importance of this vaccine but patient still declined. Advised may receive this vaccine at local pharmacy or Health Dept. Aware to provide a copy of the vaccination record if obtained from local pharmacy or Health Dept. Verbalized acceptance and understanding.   Covid-19 vaccine status: Declined, Education has been provided regarding the importance of this vaccine but patient still declined. Advised may receive this vaccine at local pharmacy or Health Dept.or vaccine clinic. Aware to provide a copy of the vaccination record if obtained from local pharmacy or Health Dept. Verbalized acceptance and understanding.  Qualifies for Shingles Vaccine? Yes   Zostavax completed No   Shingrix Completed?: No.    Education has been provided regarding the importance of this vaccine. Patient has been advised to call insurance company to determine out of pocket expense if they have not yet received this vaccine. Advised may also receive vaccine at local pharmacy or Health Dept. Verbalized acceptance and understanding.  Screening Tests Health Maintenance  Topic Date Due   Zoster Vaccines- Shingrix (1 of 2) Never done   DEXA SCAN  Never done   COVID-19 Vaccine (1 - 2024-25 season) Never done   Pneumonia Vaccine 36+ Years old (1 of 2 - PCV) 10/02/2023 (Originally 07/24/1952)   OPHTHALMOLOGY EXAM  11/13/2023 (Originally 11/27/2022)   INFLUENZA VACCINE  01/06/2024 (Originally 05/09/2023)   HEMOGLOBIN A1C  01/22/2024   Diabetic kidney evaluation - eGFR measurement  07/23/2024   Diabetic kidney evaluation - Urine ACR  07/23/2024   FOOT EXAM  07/23/2024   Medicare Annual Wellness (AWV)  09/30/2024    DTaP/Tdap/Td (4 - Td or Tdap) 07/07/2028   Hepatitis C Screening  Completed   HPV VACCINES  Aged Out    Health Maintenance  Health Maintenance Due  Topic Date Due   Zoster Vaccines- Shingrix (1 of 2) Never done   DEXA SCAN  Never done   COVID-19 Vaccine (1 - 2024-25 season) Never done    Colorectal cancer screening: No longer required.   Mammogram status: No longer required due to age .     Additional Screening:  Hepatitis C Screening: Completed 06/19/22  Vision Screening: Recommended annual ophthalmology exams for early detection of glaucoma and other disorders of the eye. Is the patient up to date with their annual eye exam?  No  Who is the provider or what is the name of the office in which the patient attends annual eye exams? Unsure of provider  If pt is not established with a provider, would they like to be referred to a provider to establish care? No .   Dental Screening: Recommended annual dental exams for proper oral hygiene  Diabetic Foot Exam: Diabetic Foot Exam: Completed 07/24/23  Community Resource Referral / Chronic Care Management: CRR required this visit?  No   CCM required this visit?  No     Plan:     I have personally reviewed and noted the following in the  patient's chart:   Medical and social history Use of alcohol, tobacco or illicit drugs  Current medications and supplements including opioid prescriptions. Patient is not currently taking opioid prescriptions. Functional ability and status Nutritional status Physical activity Advanced directives List of other physicians Hospitalizations, surgeries, and ER visits in previous 12 months Vitals Screenings to include cognitive, depression, and falls Referrals and appointments  In addition, I have reviewed and discussed with patient certain preventive protocols, quality metrics, and best practice recommendations. A written personalized care plan for preventive services as well as general  preventive health recommendations were provided to patient.     Marzella Schlein, LPN   16/07/9603   After Visit Summary: (MyChart) Due to this being a telephonic visit, the after visit summary with patients personalized plan was offered to patient via MyChart   Nurse Notes: Pt daughter completed AWV pt has aphasia no Cognition test completed

## 2023-10-17 ENCOUNTER — Emergency Department (HOSPITAL_BASED_OUTPATIENT_CLINIC_OR_DEPARTMENT_OTHER)
Admission: EM | Admit: 2023-10-17 | Discharge: 2023-10-17 | Disposition: A | Discharge status: Home / Self Care | Payer: Medicare Other | Attending: Emergency Medicine | Admitting: Emergency Medicine

## 2023-10-17 ENCOUNTER — Ambulatory Visit: Payer: Self-pay | Admitting: Family Medicine

## 2023-10-17 ENCOUNTER — Encounter (HOSPITAL_BASED_OUTPATIENT_CLINIC_OR_DEPARTMENT_OTHER): Payer: Self-pay

## 2023-10-17 ENCOUNTER — Other Ambulatory Visit: Payer: Self-pay

## 2023-10-17 ENCOUNTER — Ambulatory Visit (INDEPENDENT_AMBULATORY_CARE_PROVIDER_SITE_OTHER): Payer: Medicare Other | Admitting: Family

## 2023-10-17 ENCOUNTER — Emergency Department (HOSPITAL_BASED_OUTPATIENT_CLINIC_OR_DEPARTMENT_OTHER): Payer: Medicare Other

## 2023-10-17 VITALS — BP 183/103 | HR 118 | Temp 98.0°F | Ht 63.0 in | Wt 175.2 lb

## 2023-10-17 DIAGNOSIS — R509 Fever, unspecified: Secondary | ICD-10-CM | POA: Diagnosis not present

## 2023-10-17 DIAGNOSIS — R059 Cough, unspecified: Secondary | ICD-10-CM | POA: Insufficient documentation

## 2023-10-17 DIAGNOSIS — B974 Respiratory syncytial virus as the cause of diseases classified elsewhere: Secondary | ICD-10-CM | POA: Diagnosis not present

## 2023-10-17 DIAGNOSIS — R410 Disorientation, unspecified: Secondary | ICD-10-CM | POA: Insufficient documentation

## 2023-10-17 DIAGNOSIS — I10 Essential (primary) hypertension: Secondary | ICD-10-CM | POA: Diagnosis not present

## 2023-10-17 DIAGNOSIS — R350 Frequency of micturition: Secondary | ICD-10-CM | POA: Diagnosis not present

## 2023-10-17 DIAGNOSIS — R0981 Nasal congestion: Secondary | ICD-10-CM | POA: Diagnosis not present

## 2023-10-17 DIAGNOSIS — J069 Acute upper respiratory infection, unspecified: Secondary | ICD-10-CM

## 2023-10-17 DIAGNOSIS — B338 Other specified viral diseases: Secondary | ICD-10-CM

## 2023-10-17 DIAGNOSIS — Z794 Long term (current) use of insulin: Secondary | ICD-10-CM | POA: Insufficient documentation

## 2023-10-17 DIAGNOSIS — Z20822 Contact with and (suspected) exposure to covid-19: Secondary | ICD-10-CM | POA: Diagnosis not present

## 2023-10-17 DIAGNOSIS — I700 Atherosclerosis of aorta: Secondary | ICD-10-CM | POA: Diagnosis not present

## 2023-10-17 DIAGNOSIS — Z0389 Encounter for observation for other suspected diseases and conditions ruled out: Secondary | ICD-10-CM | POA: Diagnosis not present

## 2023-10-17 DIAGNOSIS — R531 Weakness: Secondary | ICD-10-CM | POA: Insufficient documentation

## 2023-10-17 DIAGNOSIS — Z7901 Long term (current) use of anticoagulants: Secondary | ICD-10-CM | POA: Insufficient documentation

## 2023-10-17 DIAGNOSIS — R062 Wheezing: Secondary | ICD-10-CM | POA: Diagnosis not present

## 2023-10-17 DIAGNOSIS — R0602 Shortness of breath: Secondary | ICD-10-CM | POA: Diagnosis not present

## 2023-10-17 LAB — RESP PANEL BY RT-PCR (RSV, FLU A&B, COVID)  RVPGX2
Influenza A by PCR: NEGATIVE
Influenza B by PCR: NEGATIVE
Resp Syncytial Virus by PCR: POSITIVE — AB
SARS Coronavirus 2 by RT PCR: NEGATIVE

## 2023-10-17 LAB — URINALYSIS, W/ REFLEX TO CULTURE (INFECTION SUSPECTED)
Bilirubin Urine: NEGATIVE
Glucose, UA: NEGATIVE mg/dL
Hgb urine dipstick: NEGATIVE
Ketones, ur: NEGATIVE mg/dL
Nitrite: NEGATIVE
Protein, ur: NEGATIVE mg/dL
Specific Gravity, Urine: 1.015 (ref 1.005–1.030)
pH: 7.5 (ref 5.0–8.0)

## 2023-10-17 LAB — COMPREHENSIVE METABOLIC PANEL
ALT: 15 U/L (ref 0–44)
AST: 19 U/L (ref 15–41)
Albumin: 3.9 g/dL (ref 3.5–5.0)
Alkaline Phosphatase: 75 U/L (ref 38–126)
Anion gap: 11 (ref 5–15)
BUN: 23 mg/dL (ref 8–23)
CO2: 21 mmol/L — ABNORMAL LOW (ref 22–32)
Calcium: 8.6 mg/dL — ABNORMAL LOW (ref 8.9–10.3)
Chloride: 100 mmol/L (ref 98–111)
Creatinine, Ser: 0.89 mg/dL (ref 0.44–1.00)
GFR, Estimated: >60 mL/min (ref >60)
Glucose, Bld: 196 mg/dL — ABNORMAL HIGH (ref 70–99)
Potassium: 3.6 mmol/L (ref 3.5–5.1)
Sodium: 132 mmol/L — ABNORMAL LOW (ref 135–145)
Total Bilirubin: 0.8 mg/dL (ref 0.0–1.2)
Total Protein: 7.6 g/dL (ref 6.5–8.1)

## 2023-10-17 LAB — CBC WITH DIFFERENTIAL/PLATELET
Abs Immature Granulocytes: 0.05 10*3/uL (ref 0.00–0.07)
Basophils Absolute: 0.1 10*3/uL (ref 0.0–0.1)
Basophils Relative: 1 %
Eosinophils Absolute: 0.2 10*3/uL (ref 0.0–0.5)
Eosinophils Relative: 1 %
HCT: 41.1 % (ref 36.0–46.0)
Hemoglobin: 14 g/dL (ref 12.0–15.0)
Immature Granulocytes: 0 %
Lymphocytes Relative: 14 %
Lymphs Abs: 1.9 10*3/uL (ref 0.7–4.0)
MCH: 29.5 pg (ref 26.0–34.0)
MCHC: 34.1 g/dL (ref 30.0–36.0)
MCV: 86.7 fL (ref 80.0–100.0)
Monocytes Absolute: 1 10*3/uL (ref 0.1–1.0)
Monocytes Relative: 8 %
Neutro Abs: 10 10*3/uL — ABNORMAL HIGH (ref 1.7–7.7)
Neutrophils Relative %: 76 %
Platelets: 215 10*3/uL (ref 150–400)
RBC: 4.74 MIL/uL (ref 3.87–5.11)
RDW: 12.9 % (ref 11.5–15.5)
WBC: 13.1 10*3/uL — ABNORMAL HIGH (ref 4.0–10.5)
nRBC: 0 % (ref 0.0–0.2)

## 2023-10-17 LAB — PROTIME-INR
INR: 1.3 — ABNORMAL HIGH (ref 0.8–1.2)
Prothrombin Time: 16.8 s — ABNORMAL HIGH (ref 11.4–15.2)

## 2023-10-17 LAB — LACTIC ACID, PLASMA: Lactic Acid, Venous: 1.4 mmol/L (ref 0.5–1.9)

## 2023-10-17 MED ORDER — LACTATED RINGERS IV SOLN: INTRAVENOUS | Status: DC

## 2023-10-17 MED ORDER — ALBUTEROL SULFATE HFA 108 (90 BASE) MCG/ACT IN AERS
2.0000 | INHALATION_SPRAY | Freq: Once | RESPIRATORY_TRACT | Status: AC
  Administered 2023-10-17: 2 via RESPIRATORY_TRACT
  Filled 2023-10-17: qty 6.7

## 2023-10-17 MED ORDER — IPRATROPIUM-ALBUTEROL 0.5-2.5 (3) MG/3ML IN SOLN
3.0000 mL | Freq: Once | RESPIRATORY_TRACT | Status: AC
  Administered 2023-10-17: 3 mL via RESPIRATORY_TRACT
  Filled 2023-10-17: qty 3

## 2023-10-17 NOTE — ED Triage Notes (Signed)
 Pt c/o sob for the past few days. Pt has been very weak, also had a fever of 102. Last Tylenol at 1730 650mg  by daughter.

## 2023-10-17 NOTE — Telephone Encounter (Signed)
  Chief Complaint: Shortness of breath/Fever Symptoms: temperature of 102.3, SOB, recent travel where family was sick Frequency: Since Sunday Pertinent Negatives: Patient denies CP Disposition: [x] ED /[] Urgent Care (no appt availability in office) / [] Appointment(In office/virtual)/ []  [place] [facility]/ [] Home Care/ [] Refused Recommended Disposition /[] [place] [facility]/ []  Follow-up with PCP Additional Notes: patient's daughter called-stating patient had a temperature of 102.3 and shortness of breath. Second triage of the day for patient. Patient had a same day appointment with PCP. Daughter gave patient cold and flu medication that had 650mg  of Tylenol  in it. Daughter states patient appears somewhat out of it and is burning up. Per protocol, the recommendation is for the emergency department. Daughter verbalized understanding of plan and all questions were answered.    Copied from CRM [id number]. Topic: Clinical - Red Word Triage >> Oct 17, 2023  5:37 PM Montie POUR wrote: Red Word that prompted transfer to Nurse Triage: Danielle Payne is shortness of breathe and 102.3 temperature Reason for Disposition  [1] Fever > 101 F (38.3 C) AND [2] age > 60 years  Answer Assessment - Initial Assessment Questions 1. RESPIRATORY STATUS: Describe your breathing? (e.g., wheezing, shortness of breath, unable to speak, severe coughing)      Shortness of breath 2. ONSET: When did this breathing problem begin?      Cough and cold symptoms started couple of days ago 3. PATTERN Does the difficult breathing come and go, or has it been constant since it started?      Comes and goes 4. SEVERITY: How bad is your breathing? (e.g., mild, moderate, severe)    - MILD: No SOB at rest, mild SOB with walking, speaks normally in sentences, can lie down, no retractions, pulse < 100.    - MODERATE: SOB at rest, SOB with minimal exertion and prefers to sit, cannot lie down flat, speaks in phrases, mild retractions,  audible wheezing, pulse 100-120.    - SEVERE: Very SOB at rest, speaks in single words, struggling to breathe, sitting hunched forward, retractions, pulse > 120      Moderate 5. RECURRENT SYMPTOM: Have you had difficulty breathing before? If Yes, ask: When was the last time? and What happened that time?      No 6. CARDIAC HISTORY: Do you have any history of heart disease? (e.g., heart attack, angina, bypass surgery, angioplasty)     Bypass surgery, a fib, stroke 7. LUNG HISTORY: Do you have any history of lung disease?  (e.g., pulmonary embolus, asthma, emphysema)     No 8. CAUSE: What do you think is causing the breathing problem?      unsure 9. OTHER SYMPTOMS: Do you have any other symptoms? (e.g., dizziness, runny nose, cough, chest pain, fever)     fever 10. O2 SATURATION MONITOR:  Do you use an oxygen saturation monitor (pulse oximeter) at home? If Yes, ask: What is your reading (oxygen level) today? What is your usual oxygen saturation reading? (e.g., 95%)       no 12. TRAVEL: Have you traveled out of the country in the last month? (e.g., travel history, exposures)       no  Protocols used: Breathing Difficulty-A-AH

## 2023-10-17 NOTE — ED Notes (Signed)
 ED Provider at bedside.

## 2023-10-17 NOTE — Discharge Instructions (Signed)
 1.  Control fever with extra strength Tylenol . 2.  Try to maintain hydration. 3.  Use the albuterol  inhaler up to every 4 hours if tolerated.  If heart rate is elevated try to decrease use. 4.  Return to the emergency department if you are having difficulty breathing, cannot stay hydrated, having confusion that is not baseline, or other concerning changes. 5.  Schedule a follow-up appointment with your doctor for recheck within the next 3 to 5 days.

## 2023-10-17 NOTE — Progress Notes (Signed)
 Patient ID: Nikhita Mentzel, female    DOB: 08-18-1946, 78 y.o.   MRN: [id number]  Chief Complaint  Patient presents with   Cough    Pt c/o cough, hoarseness, chest congestion and SOB. Pt states her BP has been high. Pt has been very cold for 2 days.        Discussed the use of AI scribe software for clinical note transcription with the patient, who gave verbal consent to proceed.  History of Present Illness   Tahirih, a patient with a history of stroke, congestive heart failure, atrial fibrillation, HTN, and diabetes, presents with cough/congestion for the last 3 days.  Kyonna has been experiencing symptoms of a respiratory infection. She returned home from her trip with congestion, which has not improved. She has been sleeping excessively and has developed a cough and having SOB. Xochilt's daughter reports that Riely's mucus is white and clear. Kerigan has been feeling very cold and has been bundled up in multiple layers of clothing.  [place]'s daughter states pt has a hx of Asthma, but since living with her, her sx have been under control. In addition, Kieren's daughter reports that Early's blood pressure has been consistently high, despite medication. Sakeena's blood pressure is usually around 150/90 or 130/80, but it has been higher in recent months. Hargun was away for three weeks and her blood pressure was not monitored during this time. Carmella's blood sugar has also been a concern. Her daughter reports that Rahaf's blood sugar was 190 on a sensor and 161 on a finger prick test. Karigan's daughter has been managing Ceciley's diabetes with a diet high in protein and low in junk food. Yaffa's blood sugar has been as high as 250, but it has been lower since [place] returned home.     Assessment & Plan:     Upper Respiratory Infection - Recent onset of cough and congestion, now productive with clear sputum. No fever. History of asthma. Mild wheezing noted on exam. -Restarting  Albuterol  inhaler, 1 puff as needed for wheezing, (not 2 puffs out of concern for further increasing BP) use in am and 1 hour before bedtime for next 3 days to see if any benefit with breathing. -Continue supportive care with garlic tea, vitamin C, and zinc. -Increase water  intake to 2L daily. -COnsider humidifier overnight for cough & congestion. -Consider adding Claritin for nasal symptoms and saline nasal spray for disinfecting and moisture. -Call office if sx are not improved or are worsening in next few days.  Hypertension - Persistent elevated blood pressure readings, currently on Olmesartan  40mg  daily, Amlodipine  5mg  qhs, and Carvedilol  25mg  twice daily. -Consider increasing Amlodipine  to 7.5mg  daily, split into two doses to monitor for edema, e.g. 5mg  qhs and 2.5mg  qam. -Continue monitoring blood pressure at home and notify PCP if continues to be higher than 145/90.     Subjective:    Outpatient Medications Prior to Visit  Medication Sig Dispense Refill   amLODipine  (NORVASC ) 5 MG tablet TAKE 1 TABLET BY MOUTH DAILY (Patient taking differently: Take 5 mg by mouth at bedtime.) 90 tablet 1   apixaban  (ELIQUIS ) 5 MG TABS tablet Take 1 tablet (5 mg total) by mouth 2 (two) times daily. 60 tablet 0   carvedilol  (COREG ) 25 MG tablet Take 1 tablet (25 mg total) by mouth 2 (two) times daily. 180 tablet 3   Continuous Glucose Sensor (FREESTYLE LIBRE 3 PLUS SENSOR) MISC Change sensor every 15 days. 2 each 11   glimepiride  (AMARYL ) 4  MG tablet Take 1 tablet (4 mg total) by mouth 2 (two) times daily. (Patient taking differently: Take 2 mg by mouth 2 (two) times daily.) 180 tablet 1   glucose blood (ACCU-CHEK AVIVA PLUS) test strip Use as instructed 100 each 3   MAGNESIUM  OXIDE PO Take 1 tablet by mouth daily.     olmesartan  (BENICAR ) 40 MG tablet Take 40 mg by mouth daily.     pantoprazole  (PROTONIX ) 40 MG tablet Take by mouth as needed.     Vitamin D -Vitamin K (K2 PLUS D3 PO) Take by mouth.      No facility-administered medications prior to visit.   Past Medical History:  Diagnosis Date   Allergy    Asthma    Atrial fibrillation (HCC)    Chronic combined systolic and diastolic heart failure (HCC) 05/14/2018   Coronary artery disease    Diabetes mellitus without complication (HCC)    Diverticulitis    Diverticulitis    Hypertension    Pure hypercholesterolemia 05/14/2018   Stented coronary artery    Stroke [facility])    Past Surgical History:  Procedure Laterality Date   ABDOMINAL HYSTERECTOMY     partial   CARDIAC CATHETERIZATION     CARDIOVERSION N/A 12/21/2014   Procedure: CARDIOVERSION;  Surgeon: Gordy Bergamo, MD;  Location: [facility] ENDOSCOPY;  Service: Cardiovascular;  Laterality: N/A;   CORONARY ANGIOPLASTY     CORONARY ARTERY BYPASS GRAFT     Allergies  Allergen Reactions   Gadolinium Derivatives Rash   Lisinopril      Other reaction(s): Cough      Objective:    Physical Exam Vitals and nursing note reviewed.  Constitutional:      Appearance: Normal appearance. She is obese.  Cardiovascular:     Rate and Rhythm: Normal rate and regular rhythm.  Pulmonary:     Effort: Pulmonary effort is normal.     Breath sounds: Examination of the right-upper field reveals wheezing. Examination of the left-upper field reveals wheezing. Wheezing (mild) present.  Musculoskeletal:        General: Normal range of motion.  Skin:    General: Skin is warm and dry.  Neurological:     Mental Status: She is alert.  Psychiatric:        Mood and Affect: Mood normal.        Behavior: Behavior normal.    BP (!) 183/103 (BP Location: Left Arm, Patient Position: Sitting, Cuff Size: Large)   Pulse (!) 118   Temp 98 F (36.7 C) (Temporal)   Ht 5' 3 (1.6 m)   Wt 175 lb 3.2 oz (79.5 kg)   SpO2 99%   BMI 31.04 kg/m  Wt Readings from Last 3 Encounters:  10/17/23 175 lb 3.2 oz (79.5 kg)  10/01/23 185 lb (83.9 kg)  07/24/23 175 lb 8 oz (79.6 kg)      Lucius Krabbe, NP

## 2023-10-17 NOTE — Telephone Encounter (Signed)
 Copied from CRM [id number]. Topic: Clinical - Red Word Triage >> Oct 17, 2023  9:21 AM Franky GRADE wrote: Red Word that prompted transfer to Nurse Triage: Patient's daughter is calling to advise that patient is experiencing shortness of breath, cough and feeling weak.   Chief Complaint: shortness of breath with exertion Symptoms: productive cough, runny nose Frequency: ongoing since Monday  Pertinent Negatives: Patient denies fever Disposition: [] ED /[] Urgent Care (no appt availability in office) / [x] Appointment(In office/virtual)/ []  [facility]/ [] Home Care/ [] Refused Recommended Disposition /[] [facility]/ []  Follow-up with PCP Additional Notes: The patient's daughter reported that the patient has a productive cough with clear sputum, runny nose, nasal discharge is clear, fatigue and shortness of breath with exertion.  These symptoms have been ongoing since Monday. She has a history of afib, stroke, and heart attack.  She denied dizziness.  She was scheduled for a same day appointment for further evaluation.   Reason for Disposition  [1] MILD difficulty breathing (e.g., minimal/no SOB at rest, SOB with walking, pulse <100) AND [2] NEW-onset or WORSE than normal  Answer Assessment - Initial Assessment Questions 1. RESPIRATORY STATUS: Describe your breathing? (e.g., wheezing, shortness of breath, unable to speak, severe coughing)      Shortness of breath  2. ONSET: When did this breathing problem begin?      Monday  3. PATTERN Does the difficult breathing come and go, or has it been constant since it started?      Constant with exertion  4. SEVERITY: How bad is your breathing? (e.g., mild, moderate, severe)    - MILD: No SOB at rest, mild SOB with walking, speaks normally in sentences, can lie down, no retractions, pulse < 100.    - MODERATE: SOB at rest, SOB with minimal exertion and prefers to sit, cannot lie down flat, speaks in phrases, mild retractions, audible  wheezing, pulse 100-120.    - SEVERE: Very SOB at rest, speaks in single words, struggling to breathe, sitting hunched forward, retractions, pulse > 120      With exertion  5. RECURRENT SYMPTOM: Have you had difficulty breathing before? If Yes, ask: When was the last time? and What happened that time?      Afib  6. CARDIAC HISTORY: Do you have any history of heart disease? (e.g., heart attack, angina, bypass surgery, angioplasty)      Stroke and heart attack afib  7. LUNG HISTORY: Do you have any history of lung disease?  (e.g., pulmonary embolus, asthma, emphysema)     Asthma 8. CAUSE: What do you think is causing the breathing problem?      Unsure  9. OTHER SYMPTOMS: Do you have any other symptoms? (e.g., dizziness, runny nose, cough, chest pain, fever)     Cough productive - clear  Runny nose - clear   Fever days ago - low grade  Protocols used: Breathing Difficulty-A-AH

## 2023-10-17 NOTE — Telephone Encounter (Signed)
 FYI, pt is scheduled with Dulce Sellar today.

## 2023-10-17 NOTE — ED Notes (Signed)
 Patient transported to X-ray

## 2023-10-17 NOTE — ED Provider Notes (Signed)
 [facility] Provider Note   CSN: [id number] Arrival date & time: 10/17/23  1840     History  Chief Complaint  Patient presents with   Shortness of Breath    Danielle Payne is a 78 y.o. female.  HPI Patient has history of stroke with expressive aphasia.  She just returned from visiting in [place]  last Thursday.  Her daughter cares for her at home.  Upon returning she had upper respiratory type symptoms.  They were treating with OTC medications but symptoms worsened with cough and weakness.  Patient's daughter also notes that the patient's had urinary frequency and seems to be complaining of discomfort with urination.  She has suspicion for possible UTI.  She was seen at PCPs office today and had wheezing and some congestion.  Plan at that time was to start albuterol  inhaler.  Patient did not have a fever at that time.  When she got home today she spiked a fever to 102.  Patient's daughter reports that she is more confused than at baseline and now is extremely weak requiring a lot of assistance.  She reports as of a few weeks ago patient was up and very physically active even playing pickle ball.    Home Medications Prior to Admission medications   Medication Sig Start Date End Date Taking? Authorizing Provider  amLODipine  (NORVASC ) 5 MG tablet TAKE 1 TABLET BY MOUTH DAILY Patient taking differently: Take 5 mg by mouth at bedtime. 05/21/23   Wendolyn Jenkins Jansky, MD  apixaban  (ELIQUIS ) 5 MG TABS tablet Take 1 tablet (5 mg total) by mouth 2 (two) times daily. 04/01/21   Metzger-Cihelka, Desiree, NP  carvedilol  (COREG ) 25 MG tablet Take 1 tablet (25 mg total) by mouth 2 (two) times daily. 01/28/23   Wendolyn Jenkins Jansky, MD  Continuous Glucose Sensor (FREESTYLE LIBRE 3 PLUS SENSOR) MISC Change sensor every 15 days. 09/13/23   Wendolyn Jenkins Jansky, MD  glimepiride  (AMARYL ) 4 MG tablet Take 1 tablet (4 mg total) by mouth 2 (two) times daily. Patient taking  differently: Take 2 mg by mouth 2 (two) times daily. 02/27/23   Wendolyn Jenkins Jansky, MD  glucose blood (ACCU-CHEK AVIVA PLUS) test strip Use as instructed 03/02/22   Wendolyn Jenkins Jansky, MD  MAGNESIUM  OXIDE PO Take 1 tablet by mouth daily.    [provider]  olmesartan  (BENICAR ) 40 MG tablet Take 40 mg by mouth daily.    [provider]  pantoprazole  (PROTONIX ) 40 MG tablet Take by mouth as needed. 05/24/23   [provider]  Vitamin D -Vitamin K (K2 PLUS D3 PO) Take by mouth.    [provider]  insulin  aspart (NOVOLOG ) 100 UNIT/ML injection Inject 0-20 Units into the skin every 4 (four) hours. 03/31/21 04/01/21  Metzger-Cihelka, Desiree, NP  insulin  detemir (LEVEMIR ) 100 UNIT/ML injection Inject 0.15 mLs (15 Units total) into the skin daily. 03/31/21 04/01/21  Metzger-Cihelka, Verla, NP  potassium chloride  10 MEQ/100ML Inject 100 mLs (10 mEq total) into the vein every 1 hour x 4 doses. 03/31/21 04/01/21  Metzger-Cihelka, Desiree, NP      Allergies    Gadolinium derivatives and Lisinopril     Review of Systems   Review of Systems  Physical Exam Updated Vital Signs BP (!) 166/77 (BP Location: Left Arm)   Pulse 93   Temp 98.7 F (37.1 C) (Oral)   Resp (!) 21   SpO2 96%  Physical Exam Constitutional:      Comments:  Patient is alert.  She does not have respiratory distress at rest.  Clearly has expressive aphasia with speech pattern.  Mildly agitated appearance.  HENT:     Right Ear: Tympanic membrane normal.     Left Ear: Tympanic membrane normal.     Nose: Nose normal.     Mouth/Throat:     Mouth: Mucous membranes are moist.     Pharynx: Oropharynx is clear.  Eyes:     Extraocular Movements: Extraocular movements intact.     Pupils: Pupils are equal, round, and reactive to light.  Cardiovascular:     Rate and Rhythm: Normal rate and regular rhythm.  Pulmonary:     Comments: Patient has diffuse wheezing throughout both lung fields.  Adequate airflow to the  bases. Abdominal:     Comments: Lower abdominal discomfort to palpation.  No guarding.  Musculoskeletal:        General: No swelling or tenderness. Normal range of motion.     Right lower leg: No edema.     Left lower leg: No edema.  Skin:    General: Skin is warm and dry.  Neurological:     Comments: Patient is very alert.  Her speech content is consistent with expressive aphasia.  She is trying to express an idea but has a lot of tangential words and sentences.  She assist in following commands.  No focal motor deficits identified.  Psychiatric:     Comments: Alert and pleasant but mildly agitated.     ED Results / Procedures / Treatments   Labs (all labs ordered are listed, but only abnormal results are displayed) Labs Reviewed  RESP PANEL BY RT-PCR (RSV, FLU A&B, COVID)  RVPGX2 - Abnormal; Notable for the following components:      Result Value   Resp Syncytial Virus by PCR POSITIVE (*)    All other components within normal limits  COMPREHENSIVE METABOLIC PANEL - Abnormal; Notable for the following components:   Sodium 132 (*)    CO2 21 (*)    Glucose, Bld 196 (*)    Calcium  8.6 (*)    All other components within normal limits  CBC WITH DIFFERENTIAL/PLATELET - Abnormal; Notable for the following components:   WBC 13.1 (*)    Neutro Abs 10.0 (*)    All other components within normal limits  PROTIME-INR - Abnormal; Notable for the following components:   Prothrombin Time 16.8 (*)    INR 1.3 (*)    All other components within normal limits  URINALYSIS, W/ REFLEX TO CULTURE (INFECTION SUSPECTED) - Abnormal; Notable for the following components:   APPearance HAZY (*)    Leukocytes,Ua TRACE (*)    Bacteria, UA FEW (*)    All other components within normal limits  CULTURE, BLOOD (ROUTINE X 2)  CULTURE, BLOOD (ROUTINE X 2)  LACTIC ACID, PLASMA  LACTIC ACID, PLASMA    EKG None  Radiology DG Chest 2 View Result Date: 10/17/2023 CLINICAL DATA:  Suspected Sepsis EXAM:  CHEST - 2 VIEW COMPARISON:  03/28/2021 FINDINGS: Lungs clear. Heart size and mediastinal contours are within normal limits. Left atrial clip. Aortic Atherosclerosis (ICD10-170.0). No effusion. Sternal fixation hardware. IMPRESSION: No acute cardiopulmonary disease. Electronically Signed   By: JONETTA Faes M.D.   On: 10/17/2023 19:52    Procedures Procedures    Medications Ordered in ED Medications  lactated ringers  infusion (0 mLs Intravenous Stopped 10/17/23 2330)  ipratropium-albuterol  (DUONEB) 0.5-2.5 (3) MG/3ML nebulizer solution 3 mL (3 mLs Nebulization Given  10/17/23 2211)  albuterol  (VENTOLIN  HFA) 108 (90 Base) MCG/ACT inhaler 2 puff (2 puffs Inhalation Given 10/17/23 2246)    ED Course/ Medical Decision Making/ A&P                                 Medical Decision Making Amount and/or Complexity of Data Reviewed Labs: ordered. Radiology: ordered.  Risk Prescription drug management.   Patient presents as outlined.  She has had upper respiratory illness and some symptoms of UTI.  Today she spikes fever to 102.  At baseline she has expressive aphasia but not a lot of confusion.  Daughter notes weakness and confusion today.  Broad differential diagnosis including metabolic derangement\infectious etiology\ACS\intra-abdominal or surgical etiology.  Will start with broad diagnostic evaluation and hydration.  Patient was given acetaminophen  at home at 5:30 PM.  Current temperature is 99.7.  RSV positive.  Sodium 132.  Blood glucose 196.  GFR greater than 60.  Urinalysis negative.  Lactic 1.1.  White count 13.1.  Normal H&H.  Chest x-ray reviewed by radiology no acute findings  Patient has had good response to light volume resuscitation and albuterol  nebulizer.  Temperature has come down and she has been up and ambulatory and interactive with her daughter.  At this time with RSV without oxygen requirement and significantly improved status, appropriate to trial home care.  I reviewed  symptomatic treatment with the patient's daughter who is her primary caregiver and return precautions.  She voices understanding.        Final Clinical Impression(s) / ED Diagnoses Final diagnoses:  RSV infection    Rx / DC Orders ED Discharge Orders     None      Interactive with her daughter.   Armenta Canning, MD 10/17/23 [phone]

## 2023-10-18 NOTE — Telephone Encounter (Signed)
 Please see triage note, pt advised to go to ED

## 2023-10-19 ENCOUNTER — Other Ambulatory Visit: Payer: Self-pay | Admitting: Family Medicine

## 2023-10-20 ENCOUNTER — Telehealth: Payer: Self-pay | Admitting: Family Medicine

## 2023-10-20 NOTE — Telephone Encounter (Signed)
 Late entry.  Call from on call service.  Pt is reported positive for RSV, now hypoxic with O2 sat 89% and wheezing.  Recommend ER eval and answering service will convey that to patient family.  Routed to PCP as FYI.

## 2023-10-21 NOTE — Telephone Encounter (Signed)
 Spoke to daughter and she stated that patient wanted to try doing remedies at home, so that's why they didn't go to ER. Using albuterol 4 times daily and using oxygen two times daily. Wheezing is better and oxygen has gone up to where it need to be.

## 2023-10-21 NOTE — Telephone Encounter (Signed)
 Advised to go to ED  Patient Name First: Danielle Last: Payne Gender: Female DOB: 12/03/1945 Age: 78 Y 2 M 25 D Return Phone Number: [phone] (Primary) Address: City/ State/ Zip: [place]  [address] Client  [facility] - Human Resources Officer [facility] [employer] Provider Wendolyn Caldron Contact Type Call Who Is Calling Patient / Member / Family / Caregiver Call Type Triage / Clinical Caller Name Berwyn Archer Relationship To Patient Daughter Return Phone Number [phone] (Primary) Chief Complaint BREATHING - fast, heavy or wheezing Reason for Call Symptomatic / Request for Health Information Initial Comment Caller states her mother has RSV and currently her oxygen level is at 75%, Pt is currently wheezing. Translation No Nurse Assessment Nurse: Augusto, RN, Alan Date/Time (Eastern Time): 10/19/2023 10:57:37 AM Confirm and document reason for call. If symptomatic, describe symptoms. ---Caller states that mother was diagnosed with RSV two days ago; currently wheezing; has given albuterol  treatment recently Does the patient have any new or worsening symptoms? ---Yes Will a triage be completed? ---Yes Related visit to physician within the last 2 weeks? ---Yes Does the PT have any chronic conditions? (i.e. diabetes, asthma, this includes High risk factors for pregnancy, etc.) ---Yes List chronic conditions. ---AFib, CHF, diabetes, HTN Is this a behavioral health or substance abuse call? ---No Guidelines Guideline Title Affirmed Question Affirmed Notes Nurse Date/Time (Eastern Time) Breathing Difficulty Patient sounds very sick or weak to the triager [employer], RN, Alan 10/19/2023 11:00:41 AM Disp. Time [place]itus Time) Disposition Final User 10/19/2023 10:55:31 AM Send to Urgent Vallery Nap, Shawn Disp. Time [place]itus Time) Disposition Final User 10/19/2023 11:11:42 AM Go to ED Now (or PCP triage) Yes  [place], RN, Alan 10/19/2023 11:14:21 AM Paged On Call back to Call Center [place], RN, [place] Final Disposition 10/19/2023 11:11:42 AM Go to ED Now (or PCP triage) Yes [place], RN, Alan [place] Disagree/Comply Comply Caller Understands Yes PreDisposition InappropriateToAsk Care Advice Given Per Guideline GO TO ED/UCC NOW (OR PCP TRIAGE): ANOTHER ADULT SHOULD DRIVE: * It is better and safer if another adult drives instead of you. CARE ADVICE given per Breathing Difficulty (Adult) guideline. * TRIAGER CAUTION: In selecting the most appropriate care site, you must consider both the severity of the patient's symptoms AND what resources are available at that care site. * IF PCP SECOND-LEVEL TRIAGE REQUIRED: You may need to be seen. Your doctor (or NP/PA) will want to talk with you to decide what's best. I'll page the provider on-call now. If you haven't heard from the provider (or me) within 30 minutes, go directly to the ED/UCC at _____________ Hospital. Comments User: Alan Augusto, RN Date/Time Titus Time): 10/19/2023 11:02:26 AM Dtr has been giving her natural tea (honey, lemon, garlic), pushing fluids, and rest and states that mom's wheezing is worse and fatigue/tiredness increased User: Alan Augusto, RN Date/Time Titus Time): 10/19/2023 11:21:54 AM Pulse ox: 89% User: Alan Augusto, RN Date/Time Titus Time): 10/19/2023 11:24:48 AM Dtr states mom is having a hard time staying awake also- falling asleep while just sitting at table which is very unlike her. Referrals GO TO FACILITY UNDECIDED [facility] - ED Paging DoctorName Phone DateTime Result/ Outcome Message Type Notes Cleatus Arlyss Gentry- MD [phone] 10/19/2023 11:14:21 AM Called On Call Provider - Left Message Doctor Paged Cleatus Arlyss Gentry- MD 10/19/2023 11:23:37 AM Spoke with On Call - General Message Result Relayed information to caller;

## 2023-10-22 LAB — CULTURE, BLOOD (ROUTINE X 2)
Culture: NO GROWTH
Culture: NO GROWTH
Special Requests: ADEQUATE
Special Requests: ADEQUATE

## 2023-10-24 ENCOUNTER — Ambulatory Visit (INDEPENDENT_AMBULATORY_CARE_PROVIDER_SITE_OTHER): Payer: Medicare Other | Admitting: Family Medicine

## 2023-10-24 ENCOUNTER — Encounter: Payer: Self-pay | Admitting: Family Medicine

## 2023-10-24 VITALS — BP 130/75 | HR 79 | Temp 97.5°F | Resp 18 | Ht 63.0 in | Wt 173.1 lb

## 2023-10-24 DIAGNOSIS — E1122 Type 2 diabetes mellitus with diabetic chronic kidney disease: Secondary | ICD-10-CM | POA: Diagnosis not present

## 2023-10-24 DIAGNOSIS — Z8673 Personal history of transient ischemic attack (TIA), and cerebral infarction without residual deficits: Secondary | ICD-10-CM | POA: Diagnosis not present

## 2023-10-24 DIAGNOSIS — Z7984 Long term (current) use of oral hypoglycemic drugs: Secondary | ICD-10-CM

## 2023-10-24 DIAGNOSIS — N182 Chronic kidney disease, stage 2 (mild): Secondary | ICD-10-CM

## 2023-10-24 DIAGNOSIS — E7800 Pure hypercholesterolemia, unspecified: Secondary | ICD-10-CM

## 2023-10-24 DIAGNOSIS — I10 Essential (primary) hypertension: Secondary | ICD-10-CM

## 2023-10-24 DIAGNOSIS — B338 Other specified viral diseases: Secondary | ICD-10-CM

## 2023-10-24 DIAGNOSIS — E0590 Thyrotoxicosis, unspecified without thyrotoxic crisis or storm: Secondary | ICD-10-CM

## 2023-10-24 DIAGNOSIS — Z5320 Procedure and treatment not carried out because of patient's decision for unspecified reasons: Secondary | ICD-10-CM

## 2023-10-24 MED ORDER — OLMESARTAN MEDOXOMIL 40 MG PO TABS: 40.0000 mg | ORAL_TABLET | Freq: Every day | ORAL | 1 refills | Status: DC

## 2023-10-24 MED ORDER — ALBUTEROL SULFATE HFA 108 (90 BASE) MCG/ACT IN AERS: 2.0000 | INHALATION_SPRAY | Freq: Four times a day (QID) | RESPIRATORY_TRACT | 1 refills | Status: DC | PRN

## 2023-10-24 NOTE — Progress Notes (Signed)
Subjective:    Patient ID: Danielle Payne, female    DOB: 1946/09/20, 78 y.o.   MRN: [id number]  Chief Complaint  Patient presents with   Medical Management of Chronic Issues    3 month follow-up Check lungs to see if anything else is going on Not fasting    HPI- here w/ daughter Danielle Payne Daughter reports that patient is now currently living with her.   HTN - Managed with Amlodipine 5 mg(7.5mg ) but getting edema, Carvedilol 25 mg (morning night), and Olmesartan 40 mg  Sees Card occ.  Has been exercising by walking dogs and using elliptical. Denies ha/dizziness/chest pains/palp/edema/cough. Daughter endorses mild SOB in patient when she walks up stairs.   Fatigue - better since on less glimiperide.    DM, type 2 - Managed with Glimepiride 1 mg in am.   If >200 then dau gives her cinnamon.  If not, then gives glimiperide 1mg . .  .    HLD - Not managed with medication-declines.  A fib-on eliquis bid 10/17/23 went to ER and dx RSV  Discussed the use of AI scribe software for clinical note transcription with the patient, who gave verbal consent to proceed.  History of Present Illness   The patient, with a history of hypertension, diabetes, and a recent diagnosis of RSV, presents with ongoing respiratory symptoms and concerns about blood pressure and blood glucose control. The patient's caregiver reports that the patient's symptoms have improved slightly since the onset of RSV, but the patient continues to experience chest congestion and cough. The patient's blood pressure was reportedly high during a recent hospital visit, with readings as high as 183/110, but has since improved to around 125/73 with an increase in amlodipine dosage to 7.5mg . However, the caregiver reports that the patient has been experiencing puffiness, a known side effect of amlodipine, and plans to revert to the original 5mg  dose.  The patient's blood glucose control has been managed primarily through diet and the use of a  continuous glucose monitor. The caregiver reports that the patient's blood glucose levels have been ranging from 180 to 200, and occasionally spiking to 250. The patient has been taking 1mg  of glimepiride in the morning, with additional doses given if blood glucose levels remain high. The caregiver has also been using dietary interventions such as protein, fiber, and cinnamon to help control the patient's blood glucose levels.  The patient has been using albuterol for respiratory relief, up to four times a day as needed. The caregiver has also been administering oxygen therapy twice a day due to the patient's oxygen saturation dropping to around 90%. The patient's oxygen saturation improved to 97% with this intervention.  The patient also has a history of a stroke, which occurred in 2022. The caregiver reports that the patient's thyroid levels have been fluctuating since the stroke, with recent tests showing subclinical hyperthyroidism. The patient has not been experiencing typical symptoms of hyperthyroidism such as weight loss, diarrhea, or jitteriness. Instead, the patient has been gaining weight, which may be due to dietary changes, decreased activity, or age-related factors. The patient's thyroid levels will continue to be monitored to determine if the stroke has had a long-term impact on thyroid function.  The patient's caregiver has been managing the patient's various health conditions with a combination of prescribed medications, dietary interventions, and over-the-counter supplements. The caregiver has recently started the patient on bergamot for cholesterol control and quercetin for respiratory support. The patient's response to these supplements will be evaluated in future  consultations.       Health Maintenance Due  Topic Date Due   DEXA SCAN  Never done   Past Medical History:  Diagnosis Date   Allergy    Asthma    Atrial fibrillation (HCC)    Chronic combined systolic and diastolic  heart failure (HCC) 16/07/9603   Coronary artery disease    Diabetes mellitus without complication (HCC)    Diverticulitis    Diverticulitis    Hypertension    Pure hypercholesterolemia 05/14/2018   Stented coronary artery    Stroke [facility])     Past Surgical History:  Procedure Laterality Date   ABDOMINAL HYSTERECTOMY     partial   CARDIAC CATHETERIZATION     CARDIOVERSION N/A 12/21/2014   Procedure: CARDIOVERSION;  Surgeon: Yates Decamp, MD;  Location: [facility] ENDOSCOPY;  Service: Cardiovascular;  Laterality: N/A;   CORONARY ANGIOPLASTY     CORONARY ARTERY BYPASS GRAFT       Current Outpatient Medications:    albuterol (VENTOLIN HFA) 108 (90 Base) MCG/ACT inhaler, Inhale 2 puffs into the lungs every 6 (six) hours as needed for wheezing or shortness of breath., Disp: 1 each, Rfl: 1   amLODipine (NORVASC) 5 MG tablet, Take 1 tablet (5 mg total) by mouth at bedtime., Disp: 90 tablet, Rfl: 1   apixaban (ELIQUIS) 5 MG TABS tablet, Take 1 tablet (5 mg total) by mouth 2 (two) times daily., Disp: 60 tablet, Rfl: 0   carvedilol (COREG) 25 MG tablet, Take 1 tablet (25 mg total) by mouth 2 (two) times daily., Disp: 180 tablet, Rfl: 3   Continuous Glucose Sensor (FREESTYLE LIBRE 3 PLUS SENSOR) MISC, Change sensor every 15 days., Disp: 2 each, Rfl: 11   glimepiride (AMARYL) 4 MG tablet, Take 1 tablet (4 mg total) by mouth 2 (two) times daily. (Patient taking differently: Take 2 mg by mouth 2 (two) times daily.), Disp: 180 tablet, Rfl: 1   glucose blood (ACCU-CHEK AVIVA PLUS) test strip, Use as instructed, Disp: 100 each, Rfl: 3   MAGNESIUM OXIDE PO, Take 1 tablet by mouth daily., Disp: , Rfl:    pantoprazole (PROTONIX) 40 MG tablet, Take by mouth as needed., Disp: , Rfl:    Vitamin D-Vitamin K (K2 PLUS D3 PO), Take by mouth., Disp: , Rfl:    olmesartan (BENICAR) 40 MG tablet, Take 1 tablet (40 mg total) by mouth daily., Disp: 90 tablet, Rfl: 1  Allergies  Allergen Reactions   Gadolinium Derivatives  Rash   Lisinopril     Other reaction(s): Cough   ROS neg/noncontributory except as noted HPI/below  Objective:  BP 130/75   Pulse 79   Temp (!) 97.5 F (36.4 C) (Temporal)   Resp 18   Ht 5\' 3"  (1.6 m)   Wt 173 lb 2 oz (78.5 kg)   SpO2 97%   BMI 30.67 kg/m  Wt Readings from Last 3 Encounters:  10/24/23 173 lb 2 oz (78.5 kg)  10/17/23 175 lb 3.2 oz (79.5 kg)  10/01/23 185 lb (83.9 kg)   Physical Exam   Gen: WDWN NAD HEENT: NCAT, conjunctiva not injected, sclera nonicteric NECK:  supple, no thyromegaly, no nodes, no carotid bruits CARDIAC: irRRR, S1S2+, no murmur. DP 2+B +Afib LUNGS: CTAB. +exp wheezing ABDOMEN:  BS+, soft, NTND, No HSM, no masses EXT:  no edema  MSK: no gross abnormalities.  NEURO: A&O x3.  CN II-XII intact. Speech mixed up still PSYCH: normal mood. Good eye contact     Assessment & Plan:  Diabetes mellitus with stage 2 chronic kidney disease (HCC) -     Comprehensive metabolic panel; Future -     Hemoglobin A1c; Future  Essential hypertension -     Olmesartan Medoxomil; Take 1 tablet (40 mg total) by mouth daily.  Dispense: 90 tablet; Refill: 1 -     CBC with Differential/Platelet; Future  Pure hypercholesterolemia  History of cardioembolic cerebrovascular accident (CVA)  Subclinical hyperthyroidism -     T3, free; Future -     T4, free; Future -     TSH; Future  RSV (respiratory syncytial virus infection)  Statin declined  Long term current use of oral hypoglycemic drug  Other orders -     Albuterol Sulfate HFA; Inhale 2 puffs into the lungs every 6 (six) hours as needed for wheezing or shortness of breath.  Dispense: 1 each; Refill: 1  Assessment and Plan    Respiratory Syncytial Virus (RSV) Infection RSV infection was diagnosed after recent travel to [place], with symptoms of chest congestion, cough, and fatigue since January 5th. There is improvement, but wheezing and congestion persist. No fever or chills are present.  Oxygen levels are managed at home with supplemental oxygen. Albuterol inhaler use is advised up to 4 times daily for wheezing, with potential reassessment for asthma maintenance therapy. There is a risk of bacterial superinfection, so monitoring for fever, chills, and increased difficulty breathing is necessary. Administer albuterol inhaler 3-4 times daily as needed, continue using supplemental oxygen, and monitor for signs of bacterial infection. Schedule a follow-up in 3 months.  Hypertension Blood pressure has improved from 183/110 mmHg to 125/73 mmHg. Current medications include amlodipine 5 mg at night(temporarily on 7.5mg ), carvedilol 25mg   twice daily, and olmesartan 40 mg once daily. Amlodipine was recently increased due to elevated blood pressure during RSV infection. The plan is to reduce amlodipine to 5 mg at night and monitor blood pressure closely to avoid hypotension and fluid retention. Refill olmesartan 40mg  prescription and schedule a follow-up in 3 months.  Atrial Fibrillation This condition is managed with Eliquis (apixaban) twice daily, with no recent episodes. The blood thinner is obtained through a patient assistance program. Adherence to anticoagulation therapy is emphasized to prevent thromboembolic events. Continue Eliquis twice daily and ensure regular follow-up with a cardiologist.  Type 2 Diabetes Mellitus Blood glucose levels are monitored with a sensor. The current regimen includes glimepiride 1 mg in the morning. Blood glucose levels occasionally exceed 200 mg/dL, especially postprandial. Recent dietary adjustments have been made. The goal is to maintain postprandial glucose levels below 160 mg/dL, with potential adjustments to glimepiride dosage based on continuous glucose monitoring data. Continue glimepiride 1 mg in the morning, monitor blood glucose levels regularly, adjust glimepiride dosage based on readings, and schedule an A1c test in 1-2 weeks. Schedule a follow-up in  3 months.  Subclinical Hyperthyroidism Thyroid function tests show fluctuating TSH levels, currently at 0.3, with no symptoms of hyperthyroidism. Possible interference from biotin-containing supplements was discussed, and discontinuing supplements to obtain accurate thyroid function tests is advised. Discontinue supplements containing biotin for one week, repeat thyroid function tests in one week, and monitor for symptoms of hyperthyroidism.  General Health Maintenance Vitamin D and zinc supplementation for immune support during RSV infection were discussed. There is no current cholesterol medication, but a bergamot supplement is used for cholesterol management. Plan to monitor cholesterol levels in 2 months to assess the effectiveness of bergamot supplementa tion. Continue vitamin D and zinc supplementation, monitor cholesterol levels  in 2 months, and schedule a follow-up in 3 months.  Follow-up Schedule blood work in 1-2 weeks, a follow-up appointment in 3 months, and ensure the A1c test is done at the follow-up appointment.         Return in about 3 months (around 01/22/2024) for HTN, DM   1-2 wks for labs only.    [name], MD

## 2023-10-24 NOTE — Patient Instructions (Signed)

## 2023-11-04 DIAGNOSIS — K080 Exfoliation of teeth due to systemic causes: Secondary | ICD-10-CM | POA: Diagnosis not present

## 2023-11-05 DIAGNOSIS — R4701 Aphasia: Secondary | ICD-10-CM | POA: Diagnosis not present

## 2023-11-05 DIAGNOSIS — I69320 Aphasia following cerebral infarction: Secondary | ICD-10-CM | POA: Diagnosis not present

## 2023-11-05 DIAGNOSIS — Z8673 Personal history of transient ischemic attack (TIA), and cerebral infarction without residual deficits: Secondary | ICD-10-CM | POA: Diagnosis not present

## 2023-11-07 ENCOUNTER — Other Ambulatory Visit: Payer: Medicare Other

## 2023-11-08 ENCOUNTER — Other Ambulatory Visit: Payer: Medicare Other

## 2023-11-08 DIAGNOSIS — I10 Essential (primary) hypertension: Secondary | ICD-10-CM

## 2023-11-08 DIAGNOSIS — E1122 Type 2 diabetes mellitus with diabetic chronic kidney disease: Secondary | ICD-10-CM

## 2023-11-08 DIAGNOSIS — N182 Chronic kidney disease, stage 2 (mild): Secondary | ICD-10-CM

## 2023-11-08 DIAGNOSIS — E0590 Thyrotoxicosis, unspecified without thyrotoxic crisis or storm: Secondary | ICD-10-CM

## 2023-11-08 LAB — CBC WITH DIFFERENTIAL/PLATELET
Basophils Absolute: 0.1 10*3/uL (ref 0.0–0.1)
Basophils Relative: 0.8 % (ref 0.0–3.0)
Eosinophils Absolute: 0.2 10*3/uL (ref 0.0–0.7)
Eosinophils Relative: 2.1 % (ref 0.0–5.0)
HCT: 40.7 % (ref 36.0–46.0)
Hemoglobin: 13.4 g/dL (ref 12.0–15.0)
Lymphocytes Relative: 24.6 % (ref 12.0–46.0)
Lymphs Abs: 2 10*3/uL (ref 0.7–4.0)
MCHC: 32.8 g/dL (ref 30.0–36.0)
MCV: 89.1 fL (ref 78.0–100.0)
Monocytes Absolute: 0.6 10*3/uL (ref 0.1–1.0)
Monocytes Relative: 7.2 % (ref 3.0–12.0)
Neutro Abs: 5.2 10*3/uL (ref 1.4–7.7)
Neutrophils Relative %: 65.3 % (ref 43.0–77.0)
Platelets: 322 10*3/uL (ref 150.0–400.0)
RBC: 4.57 Mil/uL (ref 3.87–5.11)
RDW: 13.9 % (ref 11.5–15.5)
WBC: 7.9 10*3/uL (ref 4.0–10.5)

## 2023-11-08 LAB — COMPREHENSIVE METABOLIC PANEL
ALT: 13 U/L (ref 0–35)
AST: 12 U/L (ref 0–37)
Albumin: 4 g/dL (ref 3.5–5.2)
Alkaline Phosphatase: 77 U/L (ref 39–117)
BUN: 25 mg/dL — ABNORMAL HIGH (ref 6–23)
CO2: 27 meq/L (ref 19–32)
Calcium: 9.2 mg/dL (ref 8.4–10.5)
Chloride: 105 meq/L (ref 96–112)
Creatinine, Ser: 0.84 mg/dL (ref 0.40–1.20)
GFR: 66.15 mL/min (ref >60.00)
Glucose, Bld: 164 mg/dL — ABNORMAL HIGH (ref 70–99)
Potassium: 3.9 meq/L (ref 3.5–5.1)
Sodium: 140 meq/L (ref 135–145)
Total Bilirubin: 0.6 mg/dL (ref 0.2–1.2)
Total Protein: 7.5 g/dL (ref 6.0–8.3)

## 2023-11-08 LAB — T3, FREE: T3, Free: 3.3 pg/mL (ref 2.3–4.2)

## 2023-11-08 LAB — TSH: TSH: 0.25 u[IU]/mL — ABNORMAL LOW (ref 0.35–5.50)

## 2023-11-08 LAB — HEMOGLOBIN A1C: Hgb A1c MFr Bld: 8.1 % — ABNORMAL HIGH (ref 4.6–6.5)

## 2023-11-08 LAB — T4, FREE: Free T4: 0.98 ng/dL (ref 0.60–1.60)

## 2023-11-10 ENCOUNTER — Encounter: Payer: Self-pay | Admitting: Family Medicine

## 2023-11-10 NOTE — Progress Notes (Signed)
Thyroid stable.  Will continue to monitor Sugars-could be better.  I would recommend taking 2mg  glimepiride every morning

## 2023-11-11 ENCOUNTER — Other Ambulatory Visit: Payer: Self-pay | Admitting: *Deleted

## 2023-11-11 MED ORDER — GLIMEPIRIDE 2 MG PO TABS: 2.0000 mg | ORAL_TABLET | Freq: Every day | ORAL | 1 refills | Status: DC

## 2023-11-12 DIAGNOSIS — I480 Paroxysmal atrial fibrillation: Secondary | ICD-10-CM | POA: Diagnosis not present

## 2023-11-12 DIAGNOSIS — I63412 Cerebral infarction due to embolism of left middle cerebral artery: Secondary | ICD-10-CM | POA: Diagnosis not present

## 2023-11-19 DIAGNOSIS — I69320 Aphasia following cerebral infarction: Secondary | ICD-10-CM | POA: Diagnosis not present

## 2023-11-19 DIAGNOSIS — Z8673 Personal history of transient ischemic attack (TIA), and cerebral infarction without residual deficits: Secondary | ICD-10-CM | POA: Diagnosis not present

## 2023-11-19 DIAGNOSIS — R4701 Aphasia: Secondary | ICD-10-CM | POA: Diagnosis not present

## 2023-11-26 DIAGNOSIS — R4701 Aphasia: Secondary | ICD-10-CM | POA: Diagnosis not present

## 2023-11-26 DIAGNOSIS — I69320 Aphasia following cerebral infarction: Secondary | ICD-10-CM | POA: Diagnosis not present

## 2023-11-26 DIAGNOSIS — Z8673 Personal history of transient ischemic attack (TIA), and cerebral infarction without residual deficits: Secondary | ICD-10-CM | POA: Diagnosis not present

## 2023-12-17 DIAGNOSIS — I69320 Aphasia following cerebral infarction: Secondary | ICD-10-CM | POA: Diagnosis not present

## 2023-12-17 DIAGNOSIS — R4701 Aphasia: Secondary | ICD-10-CM | POA: Diagnosis not present

## 2023-12-17 DIAGNOSIS — Z8673 Personal history of transient ischemic attack (TIA), and cerebral infarction without residual deficits: Secondary | ICD-10-CM | POA: Diagnosis not present

## 2023-12-31 DIAGNOSIS — R4701 Aphasia: Secondary | ICD-10-CM | POA: Diagnosis not present

## 2023-12-31 DIAGNOSIS — Z8673 Personal history of transient ischemic attack (TIA), and cerebral infarction without residual deficits: Secondary | ICD-10-CM | POA: Diagnosis not present

## 2023-12-31 DIAGNOSIS — I69320 Aphasia following cerebral infarction: Secondary | ICD-10-CM | POA: Diagnosis not present

## 2024-01-14 DIAGNOSIS — R4701 Aphasia: Secondary | ICD-10-CM | POA: Diagnosis not present

## 2024-01-14 DIAGNOSIS — Z8673 Personal history of transient ischemic attack (TIA), and cerebral infarction without residual deficits: Secondary | ICD-10-CM | POA: Diagnosis not present

## 2024-01-14 DIAGNOSIS — I69320 Aphasia following cerebral infarction: Secondary | ICD-10-CM | POA: Diagnosis not present

## 2024-01-17 ENCOUNTER — Other Ambulatory Visit: Payer: Self-pay | Admitting: Family Medicine

## 2024-01-28 DIAGNOSIS — I69320 Aphasia following cerebral infarction: Secondary | ICD-10-CM | POA: Diagnosis not present

## 2024-01-28 DIAGNOSIS — R4701 Aphasia: Secondary | ICD-10-CM | POA: Diagnosis not present

## 2024-01-28 DIAGNOSIS — Z8673 Personal history of transient ischemic attack (TIA), and cerebral infarction without residual deficits: Secondary | ICD-10-CM | POA: Diagnosis not present

## 2024-02-05 ENCOUNTER — Ambulatory Visit: Payer: Medicare Other | Admitting: Family Medicine

## 2024-02-11 DIAGNOSIS — Z8673 Personal history of transient ischemic attack (TIA), and cerebral infarction without residual deficits: Secondary | ICD-10-CM | POA: Diagnosis not present

## 2024-02-11 DIAGNOSIS — I69320 Aphasia following cerebral infarction: Secondary | ICD-10-CM | POA: Diagnosis not present

## 2024-02-11 DIAGNOSIS — R4701 Aphasia: Secondary | ICD-10-CM | POA: Diagnosis not present

## 2024-02-14 ENCOUNTER — Telehealth: Payer: Self-pay | Admitting: *Deleted

## 2024-02-14 MED ORDER — GLIMEPIRIDE 1 MG PO TABS: 1.0000 mg | ORAL_TABLET | Freq: Every day | ORAL | 0 refills | Status: DC

## 2024-02-14 NOTE — Telephone Encounter (Signed)
 Copied from CRM [id number]. Topic: Clinical - Medication Question >> Feb 14, 2024  1:12 PM Magdalene School wrote: Reason for CRM: Patient's daughter is calling to request a new prescription for olmesartan  (BENICAR ) 40 MG tablet to be the non coated pills instead of coated because she is experiencing abdominal pain with the coated one.  Spoke to [place] and informed her that per Dr. Waldo Guitar: find out through pharmacy if there is a coated one, if so they can order it, if she need to approve it she will. Rescheduled appointment for 02/26/24.

## 2024-02-14 NOTE — Telephone Encounter (Unsigned)
 Copied from CRM [id number]. Topic: Clinical - Medication Refill >> Feb 14, 2024  1:15 PM Magdalene School wrote: Medication: glimepiride  (AMARYL ) 2 MG tablet  Has the patient contacted their pharmacy? No (Agent: If no, request that the patient contact the pharmacy for the refill. If patient does not wish to contact the pharmacy document the reason why and proceed with request.) (Agent: If yes, when and what did the pharmacy advise?)  This is the patient's preferred pharmacy:  [facility] [id number] [place], [place] - [address] [place] [address] Phone: [phone] Fax: [phone]  Is this the correct pharmacy for this prescription? Yes If no, delete pharmacy and type the correct one.   Has the prescription been filled recently? No  Is the patient out of the medication? Yes  Has the patient been seen for an appointment in the last year OR does the patient have an upcoming appointment? Yes  Can we respond through MyChart? No  Agent: Please be advised that Rx refills may take up to 3 business days. We ask that you follow-up with your pharmacy.

## 2024-02-25 DIAGNOSIS — Z8673 Personal history of transient ischemic attack (TIA), and cerebral infarction without residual deficits: Secondary | ICD-10-CM | POA: Diagnosis not present

## 2024-02-25 DIAGNOSIS — R4701 Aphasia: Secondary | ICD-10-CM | POA: Diagnosis not present

## 2024-02-25 DIAGNOSIS — I69320 Aphasia following cerebral infarction: Secondary | ICD-10-CM | POA: Diagnosis not present

## 2024-02-26 ENCOUNTER — Ambulatory Visit (INDEPENDENT_AMBULATORY_CARE_PROVIDER_SITE_OTHER): Admitting: Family

## 2024-02-26 ENCOUNTER — Encounter: Payer: Self-pay | Admitting: Family

## 2024-02-26 VITALS — BP 131/84 | HR 79 | Temp 96.6°F | Ht 63.0 in | Wt 172.4 lb

## 2024-02-26 DIAGNOSIS — I10 Essential (primary) hypertension: Secondary | ICD-10-CM

## 2024-02-26 MED ORDER — OLMESARTAN MEDOXOMIL 40 MG PO TABS: 40.0000 mg | ORAL_TABLET | Freq: Every day | ORAL | 1 refills | Status: DC

## 2024-02-26 NOTE — Progress Notes (Signed)
 Patient ID: Danielle Payne, female    DOB: October 27, 1945, 78 y.o.   MRN: [id number]  Chief Complaint  Patient presents with   Hypertension  HPI: Pt presents with her dtr who provides all information as mother has aphasia. She reports her mom has been more fatigued lately and her mother thinks it is related to the Olmesartan .  The dtr states her mother wakes up fine in the morning, takes her am meds and then goes out to exercise, comes back and has to nap due to being so tired, and this is new for her. She also has had a hard time with the enteric coated pills and wants to try a non-coated pill. Her pharmacist at [facility] looked up a brand for non-coated and gave her the item # and said for her to bring back a written RX with this info typed on the RX.  Assessment & Plan  1. Essential hypertension (Primary) After discussing the meds, . She thinks that the Amlodipine  could be causing her fatigue, and I suggested she try to move the Amlodipine  to night time with the Olmesartan  and see how she does. Another option is to cut the Amlodipine  in half and she take 1/2 pill in am, 1/2 pill in pm. Written RX provided with mftr name & stock # to take to pharmacy.  - olmesartan  (BENICAR ) 40 MG tablet; Take 1 tablet (40 mg total) by mouth daily. patient requesting non coated pills, one brand is [employer] with # [id number]  Dispense: 90 tablet; Refill: 1  Subjective:     Outpatient Medications Prior to Visit  Medication Sig Dispense Refill   albuterol  (VENTOLIN  HFA) 108 (90 Base) MCG/ACT inhaler Inhale 2 puffs into the lungs every 6 (six) hours as needed for wheezing or shortness of breath. 1 each 1   amLODipine  (NORVASC ) 5 MG tablet Take 1 tablet (5 mg total) by mouth at bedtime. 90 tablet 1   apixaban  (ELIQUIS ) 5 MG TABS tablet Take 1 tablet (5 mg total) by mouth 2 (two) times daily. 60 tablet 0   carvedilol  (COREG ) 25 MG tablet TAKE 1 TABLET BY MOUTH 2 TIMES A DAY 180 tablet 3   Continuous Glucose  Sensor (FREESTYLE LIBRE 3 PLUS SENSOR) MISC Change sensor every 15 days. 2 each 11   glimepiride  (AMARYL ) 1 MG tablet Take 1 tablet (1 mg total) by mouth daily before breakfast. 90 tablet 0   glucose blood (ACCU-CHEK AVIVA PLUS) test strip Use as instructed 100 each 3   MAGNESIUM  OXIDE PO Take 1 tablet by mouth daily.     Vitamin D -Vitamin K (K2 PLUS D3 PO) Take by mouth.     olmesartan  (BENICAR ) 40 MG tablet Take 1 tablet (40 mg total) by mouth daily. (Patient taking differently: Take 40 mg by mouth daily. Takes at night.) 90 tablet 1   pantoprazole  (PROTONIX ) 40 MG tablet Take by mouth as needed. (Patient not taking: Reported on 02/26/2024)     No facility-administered medications prior to visit.   Past Medical History:  Diagnosis Date   Allergy    Asthma    Atrial fibrillation (HCC)    Chronic combined systolic and diastolic heart failure (HCC) 05/14/2018   Coronary artery disease    Diabetes mellitus without complication (HCC)    Diverticulitis    Diverticulitis    Hypertension    Pure hypercholesterolemia 05/14/2018   Stented coronary artery    Stroke [facility])    Past Surgical History:  Procedure Laterality Date  ABDOMINAL HYSTERECTOMY     partial   CARDIAC CATHETERIZATION     CARDIOVERSION N/A 12/21/2014   Procedure: CARDIOVERSION;  Surgeon: Knox Perl, MD;  Location: [facility] ENDOSCOPY;  Service: Cardiovascular;  Laterality: N/A;   CORONARY ANGIOPLASTY     CORONARY ARTERY BYPASS GRAFT     Allergies  Allergen Reactions   Gadolinium Derivatives Rash   Lisinopril      Other reaction(s): Cough      Objective:    Physical Exam Vitals and nursing note reviewed.  Constitutional:      Appearance: Normal appearance.  Cardiovascular:     Rate and Rhythm: Normal rate and regular rhythm.  Pulmonary:     Effort: Pulmonary effort is normal.     Breath sounds: Normal breath sounds.  Musculoskeletal:        General: Normal range of motion.  Skin:    General: Skin is warm and dry.   Neurological:     Mental Status: She is alert.  Psychiatric:        Mood and Affect: Mood normal.        Behavior: Behavior normal.    BP 131/84   Pulse 79   Temp (!) 96.6 F (35.9 C) (Temporal)   Ht 5\' 3"  (1.6 m)   Wt 172 lb 6.4 oz (78.2 kg)   SpO2 99%   BMI 30.54 kg/m  Wt Readings from Last 3 Encounters:  02/26/24 172 lb 6.4 oz (78.2 kg)  10/24/23 173 lb 2 oz (78.5 kg)  10/17/23 175 lb 3.2 oz (79.5 kg)      Versa Gore, NP

## 2024-03-10 DIAGNOSIS — Z8673 Personal history of transient ischemic attack (TIA), and cerebral infarction without residual deficits: Secondary | ICD-10-CM | POA: Diagnosis not present

## 2024-03-10 DIAGNOSIS — R4701 Aphasia: Secondary | ICD-10-CM | POA: Diagnosis not present

## 2024-03-10 DIAGNOSIS — I69320 Aphasia following cerebral infarction: Secondary | ICD-10-CM | POA: Diagnosis not present

## 2024-04-07 DIAGNOSIS — R4701 Aphasia: Secondary | ICD-10-CM | POA: Diagnosis not present

## 2024-04-07 DIAGNOSIS — Z8673 Personal history of transient ischemic attack (TIA), and cerebral infarction without residual deficits: Secondary | ICD-10-CM | POA: Diagnosis not present

## 2024-04-07 DIAGNOSIS — I69320 Aphasia following cerebral infarction: Secondary | ICD-10-CM | POA: Diagnosis not present

## 2024-04-17 ENCOUNTER — Other Ambulatory Visit: Payer: Self-pay | Admitting: Family Medicine

## 2024-04-17 ENCOUNTER — Ambulatory Visit: Payer: Self-pay | Admitting: Physician Assistant

## 2024-04-17 ENCOUNTER — Ambulatory Visit (INDEPENDENT_AMBULATORY_CARE_PROVIDER_SITE_OTHER)

## 2024-04-17 ENCOUNTER — Encounter: Payer: Self-pay | Admitting: Physician Assistant

## 2024-04-17 ENCOUNTER — Ambulatory Visit (INDEPENDENT_AMBULATORY_CARE_PROVIDER_SITE_OTHER): Admitting: Physician Assistant

## 2024-04-17 VITALS — BP 128/80 | HR 80 | Temp 99.1°F | Ht 63.0 in

## 2024-04-17 DIAGNOSIS — R509 Fever, unspecified: Secondary | ICD-10-CM | POA: Diagnosis not present

## 2024-04-17 DIAGNOSIS — I10 Essential (primary) hypertension: Secondary | ICD-10-CM

## 2024-04-17 DIAGNOSIS — R051 Acute cough: Secondary | ICD-10-CM

## 2024-04-17 MED ORDER — BUDESONIDE-FORMOTEROL FUMARATE 80-4.5 MCG/ACT IN AERO: 2.0000 | INHALATION_SPRAY | Freq: Two times a day (BID) | RESPIRATORY_TRACT | 0 refills | Status: DC

## 2024-04-17 MED ORDER — AZITHROMYCIN 250 MG PO TABS: ORAL_TABLET | ORAL | 0 refills | Status: AC

## 2024-04-17 NOTE — Progress Notes (Signed)
 Danielle Payne is a 78 y.o. female here for a new problem.SABRA  History of Present Illness:   Chief Complaint  Patient presents with   Fatigue    Pt seen today for facial puffiness and lack of energy, ear pain, coughing; slept all day the past two days; ran a fever of 101.8 two days ago.    Patient's daughter present  HPI  Fatigue Patient complains of facial puffiness, and lack of energy that has persisted since Sunday/Monday.  Patients daughter had recent viral upper respiratory infection (URI) with similar symptom(s) that started last weekend -- the patient then started having symptom(s) a few days later Associated symptoms include ear pain and a fever of 101.8 on Wednesday. She also reports mild SOB which she is using her albuterol  inhaler for.  She has history of asthma Reports taking Nyquil and vitamin C, D, and Zinc, oil of oregano.  Denies any GI symptoms, sore throat, sinus congestion, or chest pain.   She is a diabetic  Lab Results  Component Value Date   HGBA1C 8.1 (H) 11/08/2023     Past Medical History:  Diagnosis Date   Allergy    Asthma    Atrial fibrillation (HCC)    Chronic combined systolic and diastolic heart failure (HCC) 05/14/2018   Coronary artery disease    Diabetes mellitus without complication (HCC)    Diverticulitis    Diverticulitis    Hypertension    Pure hypercholesterolemia 05/14/2018   Stented coronary artery    Stroke [facility])      Social History   Tobacco Use   Smoking status: Never   Smokeless tobacco: Never  Vaping Use   Vaping status: Never Used  Substance Use Topics   Alcohol use: No   Drug use: No    Past Surgical History:  Procedure Laterality Date   ABDOMINAL HYSTERECTOMY     partial   CARDIAC CATHETERIZATION     CARDIOVERSION N/A 12/21/2014   Procedure: CARDIOVERSION;  Surgeon: Gordy Bergamo, MD;  Location: [facility] ENDOSCOPY;  Service: Cardiovascular;  Laterality: N/A;   CORONARY ANGIOPLASTY     CORONARY ARTERY BYPASS GRAFT       Family History  Problem Relation Age of Onset   CAD Mother    Stroke Mother    Hypertension Mother    Prostate cancer Father    Diabetes Mellitus II Sister    Diabetes Mellitus II Brother    CAD Maternal Aunt     Allergies  Allergen Reactions   Gadolinium Derivatives Rash   Lisinopril      Other reaction(s): Cough    Current Medications:   Current Outpatient Medications:    albuterol  (VENTOLIN  HFA) 108 (90 Base) MCG/ACT inhaler, Inhale 2 puffs into the lungs every 6 (six) hours as needed for wheezing or shortness of breath., Disp: 1 each, Rfl: 1   apixaban  (ELIQUIS ) 5 MG TABS tablet, Take 1 tablet (5 mg total) by mouth 2 (two) times daily., Disp: 60 tablet, Rfl: 0   azithromycin  (ZITHROMAX ) 250 MG tablet, Take 2 tablets on day 1, then 1 tablet daily on days 2 through 5, Disp: 6 tablet, Rfl: 0   budesonide -formoterol  (SYMBICORT ) 80-4.5 MCG/ACT inhaler, Inhale 2 puffs into the lungs 2 (two) times daily., Disp: 1 each, Rfl: 0   carvedilol  (COREG ) 25 MG tablet, TAKE 1 TABLET BY MOUTH 2 TIMES A DAY, Disp: 180 tablet, Rfl: 3   Continuous Glucose Sensor (FREESTYLE LIBRE 3 PLUS SENSOR) MISC, Change sensor every 15 days., Disp: 2  each, Rfl: 11   glimepiride  (AMARYL ) 1 MG tablet, Take 1 tablet (1 mg total) by mouth daily before breakfast., Disp: 90 tablet, Rfl: 0   glucose blood (ACCU-CHEK AVIVA PLUS) test strip, Use as instructed, Disp: 100 each, Rfl: 3   MAGNESIUM  OXIDE PO, Take 1 tablet by mouth daily., Disp: , Rfl:    olmesartan  (BENICAR ) 40 MG tablet, TAKE 1 TABLET BY MOUTH DAILY, Disp: 90 tablet, Rfl: 1   Vitamin D -Vitamin K (K2 PLUS D3 PO), Take by mouth., Disp: , Rfl:    amLODipine  (NORVASC ) 5 MG tablet, TAKE 1 TABLET BY MOUTH AT BEDTIME, Disp: 90 tablet, Rfl: 1   pantoprazole  (PROTONIX ) 40 MG tablet, Take by mouth as needed. (Patient not taking: Reported on 04/17/2024), Disp: , Rfl:    Review of Systems:   Review of Systems  Constitutional:  Positive for fever and  malaise/fatigue.  HENT:  Positive for ear pain. Negative for sinus pain and sore throat.   Respiratory:  Positive for shortness of breath.     Vitals:   Vitals:   04/17/24 1123  BP: 128/80  Pulse: 80  Temp: 99.1 F (37.3 C)  TempSrc: Temporal  SpO2: 94%  Height: 5' 3 (1.6 m)     Body mass index is 30.54 kg/m.  Physical Exam:   Physical Exam Vitals and nursing note reviewed.  Constitutional:      General: She is not in acute distress.    Appearance: She is well-developed. She is not ill-appearing or toxic-appearing.  HENT:     Head: Normocephalic and atraumatic.     Right Ear: Tympanic membrane, ear canal and external ear normal. Tympanic membrane is not erythematous, retracted or bulging.     Left Ear: Tympanic membrane, ear canal and external ear normal. Tympanic membrane is not erythematous, retracted or bulging.     Nose: Nose normal.     Right Sinus: No maxillary sinus tenderness or frontal sinus tenderness.     Left Sinus: No maxillary sinus tenderness or frontal sinus tenderness.     Mouth/Throat:     Pharynx: Uvula midline. No posterior oropharyngeal erythema.  Eyes:     General: Lids are normal.     Conjunctiva/sclera: Conjunctivae normal.  Neck:     Trachea: Trachea normal.  Cardiovascular:     Rate and Rhythm: Normal rate and regular rhythm.     Heart sounds: Normal heart sounds, S1 normal and S2 normal.  Pulmonary:     Effort: Pulmonary effort is normal.     Breath sounds: Examination of the left-lower field reveals decreased breath sounds. Decreased breath sounds present. No wheezing, rhonchi or rales.  Lymphadenopathy:     Cervical: No cervical adenopathy.  Skin:    General: Skin is warm and dry.  Neurological:     Mental Status: She is alert.  Psychiatric:        Speech: Speech normal.        Behavior: Behavior normal. Behavior is cooperative.     Assessment and Plan:   Acute cough No red flags on exam.   Due to recent fever, history of  asthma and recent fatigue -- will order xray for evaluation Will also start azithroymicin to cover for bacterial infection Will also add on steroid inhaler for her to use for her chest tightness   Patient's daughter did not allow our staff to complete COVID or flu nasal swab on patient today.  Discussed taking medications as prescribed.  Reviewed return precautions including new  or worsening fever, SOB, new or worsening cough or other concerns.  Push fluids and rest.  I recommend that patient follow-up if symptoms worsen or persist despite treatment x 7-10 days, sooner if needed.    Lucie Buttner, PA-C  I,Safa M Kadhim,acting as a scribe for Lucie Buttner, PA.,have documented all relevant documentation on the behalf of Lucie Buttner, PA,as directed by  Lucie Buttner, PA while in the presence of Lucie Buttner, [place].   I, Lucie Buttner, [place], have reviewed all documentation for this visit. The documentation on 04/17/24 for the exam, diagnosis, procedures, and orders are all accurate and complete.

## 2024-04-17 NOTE — Patient Instructions (Signed)
 It was great to see you!  Start azithromycin   add steroid inhaler  We will be in touch with chest xray results  Push fluids and get plenty of rest. Please return if you are not improving as expected, or if you have high fevers (>101.5) or difficulty swallowing or worsening productive cough.  Call clinic with questions.  I hope you start feeling better soon!    Take care,  Lucie Buttner PA-C

## 2024-05-12 DIAGNOSIS — I69320 Aphasia following cerebral infarction: Secondary | ICD-10-CM | POA: Diagnosis not present

## 2024-05-12 DIAGNOSIS — Z8673 Personal history of transient ischemic attack (TIA), and cerebral infarction without residual deficits: Secondary | ICD-10-CM | POA: Diagnosis not present

## 2024-05-12 DIAGNOSIS — R4701 Aphasia: Secondary | ICD-10-CM | POA: Diagnosis not present

## 2024-05-14 ENCOUNTER — Other Ambulatory Visit: Payer: Self-pay | Admitting: Physician Assistant

## 2024-06-05 DIAGNOSIS — M7061 Trochanteric bursitis, right hip: Secondary | ICD-10-CM | POA: Diagnosis not present

## 2024-06-09 DIAGNOSIS — R4701 Aphasia: Secondary | ICD-10-CM | POA: Diagnosis not present

## 2024-06-09 DIAGNOSIS — Z8673 Personal history of transient ischemic attack (TIA), and cerebral infarction without residual deficits: Secondary | ICD-10-CM | POA: Diagnosis not present

## 2024-06-09 DIAGNOSIS — I69320 Aphasia following cerebral infarction: Secondary | ICD-10-CM | POA: Diagnosis not present

## 2024-06-10 ENCOUNTER — Encounter (HOSPITAL_BASED_OUTPATIENT_CLINIC_OR_DEPARTMENT_OTHER): Payer: Self-pay | Admitting: Emergency Medicine

## 2024-06-10 ENCOUNTER — Other Ambulatory Visit: Payer: Self-pay

## 2024-06-10 ENCOUNTER — Emergency Department (HOSPITAL_BASED_OUTPATIENT_CLINIC_OR_DEPARTMENT_OTHER)
Admission: EM | Admit: 2024-06-10 | Discharge: 2024-06-10 | Disposition: A | Discharge status: Home / Self Care | Attending: Emergency Medicine | Admitting: Emergency Medicine

## 2024-06-10 ENCOUNTER — Emergency Department (HOSPITAL_BASED_OUTPATIENT_CLINIC_OR_DEPARTMENT_OTHER)

## 2024-06-10 DIAGNOSIS — Z7901 Long term (current) use of anticoagulants: Secondary | ICD-10-CM | POA: Diagnosis not present

## 2024-06-10 DIAGNOSIS — R1031 Right lower quadrant pain: Secondary | ICD-10-CM | POA: Diagnosis not present

## 2024-06-10 DIAGNOSIS — K8020 Calculus of gallbladder without cholecystitis without obstruction: Secondary | ICD-10-CM | POA: Diagnosis not present

## 2024-06-10 DIAGNOSIS — K429 Umbilical hernia without obstruction or gangrene: Secondary | ICD-10-CM | POA: Diagnosis not present

## 2024-06-10 DIAGNOSIS — N281 Cyst of kidney, acquired: Secondary | ICD-10-CM | POA: Insufficient documentation

## 2024-06-10 DIAGNOSIS — K5790 Diverticulosis of intestine, part unspecified, without perforation or abscess without bleeding: Secondary | ICD-10-CM | POA: Diagnosis not present

## 2024-06-10 LAB — COMPREHENSIVE METABOLIC PANEL WITH GFR
ALT: 16 U/L (ref 0–44)
AST: 15 U/L (ref 15–41)
Albumin: 4.3 g/dL (ref 3.5–5.0)
Alkaline Phosphatase: 90 U/L (ref 38–126)
Anion gap: 12 (ref 5–15)
BUN: 25 mg/dL — ABNORMAL HIGH (ref 8–23)
CO2: 21 mmol/L — ABNORMAL LOW (ref 22–32)
Calcium: 9.5 mg/dL (ref 8.9–10.3)
Chloride: 104 mmol/L (ref 98–111)
Creatinine, Ser: 0.88 mg/dL (ref 0.44–1.00)
GFR, Estimated: >60 mL/min (ref >60)
Glucose, Bld: 171 mg/dL — ABNORMAL HIGH (ref 70–99)
Potassium: 3.9 mmol/L (ref 3.5–5.1)
Sodium: 137 mmol/L (ref 135–145)
Total Bilirubin: 0.6 mg/dL (ref 0.0–1.2)
Total Protein: 7.5 g/dL (ref 6.5–8.1)

## 2024-06-10 LAB — CBC WITH DIFFERENTIAL/PLATELET
Abs Immature Granulocytes: 0.03 K/uL (ref 0.00–0.07)
Basophils Absolute: 0.1 K/uL (ref 0.0–0.1)
Basophils Relative: 1 %
Eosinophils Absolute: 0.2 K/uL (ref 0.0–0.5)
Eosinophils Relative: 3 %
HCT: 42.7 % (ref 36.0–46.0)
Hemoglobin: 14.7 g/dL (ref 12.0–15.0)
Immature Granulocytes: 0 %
Lymphocytes Relative: 31 %
Lymphs Abs: 2.9 K/uL (ref 0.7–4.0)
MCH: 30 pg (ref 26.0–34.0)
MCHC: 34.4 g/dL (ref 30.0–36.0)
MCV: 87.1 fL (ref 80.0–100.0)
Monocytes Absolute: 0.9 K/uL (ref 0.1–1.0)
Monocytes Relative: 10 %
Neutro Abs: 5.3 K/uL (ref 1.7–7.7)
Neutrophils Relative %: 55 %
Platelets: 231 K/uL (ref 150–400)
RBC: 4.9 MIL/uL (ref 3.87–5.11)
RDW: 13.2 % (ref 11.5–15.5)
WBC: 9.5 K/uL (ref 4.0–10.5)
nRBC: 0 % (ref 0.0–0.2)

## 2024-06-10 LAB — URINALYSIS, ROUTINE W REFLEX MICROSCOPIC
Bilirubin Urine: NEGATIVE
Glucose, UA: NEGATIVE mg/dL
Hgb urine dipstick: NEGATIVE
Ketones, ur: NEGATIVE mg/dL
Leukocytes,Ua: NEGATIVE
Nitrite: NEGATIVE
Protein, ur: NEGATIVE mg/dL
Specific Gravity, Urine: 1.01 (ref 1.005–1.030)
pH: 6 (ref 5.0–8.0)

## 2024-06-10 MED ORDER — LIDOCAINE 5 % EX PTCH: 1.0000 | MEDICATED_PATCH | CUTANEOUS | 0 refills | Status: AC

## 2024-06-10 MED ORDER — LIDOCAINE 5 % EX PTCH
1.0000 | MEDICATED_PATCH | CUTANEOUS | Status: DC
  Administered 2024-06-10: 1 via TRANSDERMAL
  Filled 2024-06-10: qty 1

## 2024-06-10 MED ORDER — KETOROLAC TROMETHAMINE 30 MG/ML IJ SOLN
15.0000 mg | Freq: Once | INTRAMUSCULAR | Status: AC
  Administered 2024-06-10: 15 mg via INTRAVENOUS
  Filled 2024-06-10: qty 1

## 2024-06-10 NOTE — ED Provider Notes (Signed)
 [place] EMERGENCY DEPARTMENT AT [facility] Provider Note   CSN: [id number] Arrival date & time: 06/10/24  0320     Patient presents with: Abdominal Pain   Danielle Payne is a 78 y.o. female.   The history is provided by the patient.  Abdominal Pain Pain location:  RLQ Pain radiates to:  Does not radiate Pain severity:  Severe Onset quality:  Sudden Duration:  4 weeks (since pickle ball injury) Timing:  Constant Progression:  Worsening Chronicity:  New Context: not sick contacts and not suspicious food intake   Relieved by:  Nothing Worsened by:  Nothing Ineffective treatments:  None tried Associated symptoms: no anorexia, no chest pain, no dysuria, no fever, no flatus, no shortness of breath and no vomiting   Risk factors: has not had multiple surgeries        Prior to Admission medications   Medication Sig Start Date End Date Taking? Authorizing Provider  lidocaine  (LIDODERM ) 5 % Place 1 patch onto the skin daily. Remove & Discard patch within 12 hours or as directed by MD 06/10/24  Yes Suhana Wilner, MD  albuterol  (VENTOLIN  HFA) 108 (90 Base) MCG/ACT inhaler Inhale 2 puffs into the lungs every 6 (six) hours as needed for wheezing or shortness of breath. 10/24/23   Wendolyn Jenkins Jansky, MD  amLODipine  (NORVASC ) 5 MG tablet TAKE 1 TABLET BY MOUTH AT BEDTIME 04/17/24   Wendolyn Jenkins Jansky, MD  apixaban  (ELIQUIS ) 5 MG TABS tablet Take 1 tablet (5 mg total) by mouth 2 (two) times daily. 04/01/21   Metzger-Cihelka, Desiree, NP  budesonide -formoterol  (SYMBICORT ) 80-4.5 MCG/ACT inhaler INHALE 2 PUFFS BY MOUTH 2 TIMES A DAY 05/14/24   Job Lukes, PA  carvedilol  (COREG ) 25 MG tablet TAKE 1 TABLET BY MOUTH 2 TIMES A DAY 01/18/24   Wendolyn Jenkins Jansky, MD  Continuous Glucose Sensor (FREESTYLE LIBRE 3 PLUS SENSOR) MISC Change sensor every 15 days. 09/13/23   Wendolyn Jenkins Jansky, MD  glimepiride  (AMARYL ) 1 MG tablet Take 1 tablet (1 mg total) by mouth daily before breakfast. 02/14/24    Wendolyn Jenkins Jansky, MD  glucose blood (ACCU-CHEK AVIVA PLUS) test strip Use as instructed 03/02/22   Wendolyn Jenkins Jansky, MD  MAGNESIUM  OXIDE PO Take 1 tablet by mouth daily.    [provider]  olmesartan  (BENICAR ) 40 MG tablet TAKE 1 TABLET BY MOUTH DAILY 04/17/24   Wendolyn Jenkins Jansky, MD  pantoprazole  (PROTONIX ) 40 MG tablet Take by mouth as needed. Patient not taking: Reported on 04/17/2024 05/24/23   [provider]  Vitamin D -Vitamin K (K2 PLUS D3 PO) Take by mouth.    [provider]  insulin  aspart (NOVOLOG ) 100 UNIT/ML injection Inject 0-20 Units into the skin every 4 (four) hours. 03/31/21 04/01/21  Metzger-Cihelka, Desiree, NP  insulin  detemir (LEVEMIR ) 100 UNIT/ML injection Inject 0.15 mLs (15 Units total) into the skin daily. 03/31/21 04/01/21  Metzger-Cihelka, Verla, NP  potassium chloride  10 MEQ/100ML Inject 100 mLs (10 mEq total) into the vein every 1 hour x 4 doses. 03/31/21 04/01/21  Metzger-Cihelka, Desiree, NP    Allergies: Gadolinium derivatives and Lisinopril     Review of Systems  Constitutional:  Negative for fever.  Respiratory:  Negative for shortness of breath.   Cardiovascular:  Negative for chest pain.  Gastrointestinal:  Positive for abdominal pain. Negative for anorexia, flatus and vomiting.  Genitourinary:  Negative for dysuria and flank pain.  All other systems reviewed and are negative.   Updated Vital Signs BP (!) 160/90 (  BP Location: Left Arm)   Pulse 87   Temp 97.8 F (36.6 C) (Oral)   Resp 20   Ht 5' 4 (1.626 m)   Wt 81.6 kg   SpO2 100%   BMI 30.90 kg/m   Physical Exam Vitals and nursing note reviewed.  Constitutional:      General: She is not in acute distress.    Appearance: Normal appearance. She is well-developed.  HENT:     Head: Normocephalic and atraumatic.     Nose: Nose normal.  Eyes:     Pupils: Pupils are equal, round, and reactive to light.  Cardiovascular:     Rate and Rhythm: Normal rate and regular rhythm.      Pulses: Normal pulses.     Heart sounds: Normal heart sounds.  Pulmonary:     Effort: Pulmonary effort is normal. No respiratory distress.     Breath sounds: Normal breath sounds.  Abdominal:     General: There is no distension.     Palpations: Abdomen is soft.     Tenderness: There is no abdominal tenderness. There is no guarding or rebound. Negative signs include Murphy's sign, Rovsing's sign and McBurney's sign.     Comments: Increase BS throughout   Musculoskeletal:        General: Normal range of motion.     Cervical back: Normal range of motion and neck supple.  Skin:    General: Skin is dry.     Capillary Refill: Capillary refill takes less than 2 seconds.     Findings: No erythema or rash.  Neurological:     General: No focal deficit present.     Mental Status: She is alert.     Deep Tendon Reflexes: Reflexes normal.  Psychiatric:        Mood and Affect: Mood normal.     (all labs ordered are listed, but only abnormal results are displayed) Results for orders placed or performed during the hospital encounter of 06/10/24  CBC with Differential   Collection Time: 06/10/24  4:05 AM  Result Value Ref Range   WBC 9.5 4.0 - 10.5 K/uL   RBC 4.90 3.87 - 5.11 MIL/uL   Hemoglobin 14.7 12.0 - 15.0 g/dL   HCT 57.2 63.9 - 53.9 %   MCV 87.1 80.0 - 100.0 fL   MCH 30.0 26.0 - 34.0 pg   MCHC 34.4 30.0 - 36.0 g/dL   RDW 86.7 88.4 - 84.4 %   Platelets 231 150 - 400 K/uL   nRBC 0.0 0.0 - 0.2 %   Neutrophils Relative % 55 %   Neutro Abs 5.3 1.7 - 7.7 K/uL   Lymphocytes Relative 31 %   Lymphs Abs 2.9 0.7 - 4.0 K/uL   Monocytes Relative 10 %   Monocytes Absolute 0.9 0.1 - 1.0 K/uL   Eosinophils Relative 3 %   Eosinophils Absolute 0.2 0.0 - 0.5 K/uL   Basophils Relative 1 %   Basophils Absolute 0.1 0.0 - 0.1 K/uL   Immature Granulocytes 0 %   Abs Immature Granulocytes 0.03 0.00 - 0.07 K/uL  Comprehensive metabolic panel   Collection Time: 06/10/24  4:05 AM  Result Value  Ref Range   Sodium 137 135 - 145 mmol/L   Potassium 3.9 3.5 - 5.1 mmol/L   Chloride 104 98 - 111 mmol/L   CO2 21 (L) 22 - 32 mmol/L   Glucose, Bld 171 (H) 70 - 99 mg/dL   BUN 25 (H) 8 - 23 mg/dL  Creatinine, Ser 0.88 0.44 - 1.00 mg/dL   Calcium  9.5 8.9 - 10.3 mg/dL   Total Protein 7.5 6.5 - 8.1 g/dL   Albumin 4.3 3.5 - 5.0 g/dL   AST 15 15 - 41 U/L   ALT 16 0 - 44 U/L   Alkaline Phosphatase 90 38 - 126 U/L   Total Bilirubin 0.6 0.0 - 1.2 mg/dL   GFR, Estimated >39 >39 mL/min   Anion gap 12 5 - 15  Urinalysis, Routine w reflex microscopic -Urine, Clean Catch   Collection Time: 06/10/24  4:05 AM  Result Value Ref Range   Color, Urine YELLOW YELLOW   APPearance CLEAR CLEAR   Specific Gravity, Urine 1.010 1.005 - 1.030   pH 6.0 5.0 - 8.0   Glucose, UA NEGATIVE NEGATIVE mg/dL   Hgb urine dipstick NEGATIVE NEGATIVE   Bilirubin Urine NEGATIVE NEGATIVE   Ketones, ur NEGATIVE NEGATIVE mg/dL   Protein, ur NEGATIVE NEGATIVE mg/dL   Nitrite NEGATIVE NEGATIVE   Leukocytes,Ua NEGATIVE NEGATIVE   CT Renal Stone Study Result Date: 06/10/2024 CLINICAL DATA:  Right flank pain. EXAM: CT ABDOMEN AND PELVIS WITHOUT CONTRAST TECHNIQUE: Multidetector CT imaging of the abdomen and pelvis was performed following the standard protocol without IV contrast. RADIATION DOSE REDUCTION: This exam was performed according to the departmental dose-optimization program which includes automated exposure control, adjustment of the mA and/or kV according to patient size and/or use of iterative reconstruction technique. COMPARISON:  CT with IV contrast 02/23/2023, 04/02/2019. FINDINGS: Lower chest: Lung bases are clear of infiltrates. There is a calcified right lower lobe granuloma. The heart is slightly enlarged. 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 Scout image demonstrates a left atrial appendage clip. Native coronary calcifications are present. Hepatobiliary: No liver mass is seen without contrast. There is a  calcified granuloma in segment 2. There are a few tiny stones in the proximal gallbladder. There is no wall thickening or bile duct dilatation. Pancreas: Moderately fatty replaced. No mass. No ductal dilatation. Spleen: No mass. No splenomegaly. Scattered calcified granulomas. Extensive calcification in the splenic artery. Adrenals/Urinary Tract: There is no adrenal mass. Again noted is an 8 cm Bosniak 2 cyst off the lower pole of the right kidney, with a short thin partially calcified septation anteriorly, was previously 7.5 cm. The cyst is otherwise uncomplicated, Hounsfield density is 14. No other contour deforming renal abnormality is seen. There is no urinary stone or obstruction. No ureteral calculus is seen bilaterally. There is pelvic floor laxity with small cystocele. Possible bladder could be mildly thickened. Correlate with urinalysis for possible cystitis. Stomach/Bowel: Stable contracted appearance of the stomach. Unremarkable unopacified small bowel. Normal caliber appendix. Mobile cecum in the right mid abdomen inferiorly. There is diverticulosis heaviest in the distal descending and sigmoid segments, without evidence of acute diverticulitis. Vascular/Lymphatic: Aortic atherosclerosis. No enlarged abdominal or pelvic lymph nodes. Reproductive: Status post hysterectomy. No adnexal masses. Other: Small umbilical fat hernia. No incarcerated hernia. No free fluid, free air, or free hemorrhage or localized collections. Musculoskeletal: Degenerative change and mild dextroscoliosis lumbar spine. Mild osteopenia. IMPRESSION: 1. No urinary stone or obstruction is seen. 2. Pelvic floor laxity with small cystocele. 3. Possible bladder wall thickening versus nondistention. Correlate with urinalysis for possible cystitis. 4. Diverticulosis without evidence of diverticulitis. 5. Cholelithiasis. 6. Aortic and coronary artery atherosclerosis. 7. 8 cm Bosniak 2 cyst of the right kidney, previously 7.5 cm. 8. Small  umbilical fat hernia. 9. Osteopenia and degenerative change. Aortic Atherosclerosis (ICD10-I70.0). Electronically Signed  By: Francis Quam M.D.   On: 06/10/2024 05:27     Radiology: CT Renal Stone Study Result Date: 06/10/2024 CLINICAL DATA:  Right flank pain. EXAM: CT ABDOMEN AND PELVIS WITHOUT CONTRAST TECHNIQUE: Multidetector CT imaging of the abdomen and pelvis was performed following the standard protocol without IV contrast. RADIATION DOSE REDUCTION: This exam was performed according to the departmental dose-optimization program which includes automated exposure control, adjustment of the mA and/or kV according to patient size and/or use of iterative reconstruction technique. COMPARISON:  CT with IV contrast 02/23/2023, 04/02/2019. FINDINGS: Lower chest: Lung bases are clear of infiltrates. There is a calcified right lower lobe granuloma. The heart is slightly enlarged. 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 Scout image demonstrates a left atrial appendage clip. Native coronary calcifications are present. Hepatobiliary: No liver mass is seen without contrast. There is a calcified granuloma in segment 2. There are a few tiny stones in the proximal gallbladder. There is no wall thickening or bile duct dilatation. Pancreas: Moderately fatty replaced. No mass. No ductal dilatation. Spleen: No mass. No splenomegaly. Scattered calcified granulomas. Extensive calcification in the splenic artery. Adrenals/Urinary Tract: There is no adrenal mass. Again noted is an 8 cm Bosniak 2 cyst off the lower pole of the right kidney, with a short thin partially calcified septation anteriorly, was previously 7.5 cm. The cyst is otherwise uncomplicated, Hounsfield density is 14. No other contour deforming renal abnormality is seen. There is no urinary stone or obstruction. No ureteral calculus is seen bilaterally. There is pelvic floor laxity with small cystocele. Possible bladder could be mildly thickened. Correlate with  urinalysis for possible cystitis. Stomach/Bowel: Stable contracted appearance of the stomach. Unremarkable unopacified small bowel. Normal caliber appendix. Mobile cecum in the right mid abdomen inferiorly. There is diverticulosis heaviest in the distal descending and sigmoid segments, without evidence of acute diverticulitis. Vascular/Lymphatic: Aortic atherosclerosis. No enlarged abdominal or pelvic lymph nodes. Reproductive: Status post hysterectomy. No adnexal masses. Other: Small umbilical fat hernia. No incarcerated hernia. No free fluid, free air, or free hemorrhage or localized collections. Musculoskeletal: Degenerative change and mild dextroscoliosis lumbar spine. Mild osteopenia. IMPRESSION: 1. No urinary stone or obstruction is seen. 2. Pelvic floor laxity with small cystocele. 3. Possible bladder wall thickening versus nondistention. Correlate with urinalysis for possible cystitis. 4. Diverticulosis without evidence of diverticulitis. 5. Cholelithiasis. 6. Aortic and coronary artery atherosclerosis. 7. 8 cm Bosniak 2 cyst of the right kidney, previously 7.5 cm. 8. Small umbilical fat hernia. 9. Osteopenia and degenerative change. Aortic Atherosclerosis (ICD10-I70.0). Electronically Signed   By: Francis Quam M.D.   On: 06/10/2024 05:27     Procedures   Medications Ordered in the ED  lidocaine  (LIDODERM ) 5 % 1 patch (1 patch Transdermal Patch Applied 06/10/24 0604)  ketorolac  (TORADOL ) 30 MG/ML injection 15 mg (15 mg Intravenous Given 06/10/24 0601)                                    Medical Decision Making Patient with ongoing RLQ pain since pickle ball injury in early August   Amount and/or Complexity of Data Reviewed Independent Historian:     Details: Daughter see above  External Data Reviewed: notes.    Details: Previous notes reviewed  Labs: ordered.    Details: Urine is negative for UTI.  Normal white count 9.5. normal hemoglobin 14.7, normal platelets normal sodium 137, normal  potassium 3.9, normal LFTS Radiology:  ordered and independent interpretation performed.    Details: Renal cyst by me   Risk Prescription drug management. Risk Details: While there are gall stones on CT the pain is too low for this and LFTs are normal.  It is not cystitis.  Urine is negative.  Not appendicitis based on exam, imaging or history.  It is either gas pain or gas pain superimposed on abdominal wall strain from pickle ball injury that patient was seen for and had hip Xray.  I have informed patient of renal cyst but I do not believe this is the cause.  Tylenol  for pain will add lidoderm .  Follow up with your PMD for ongoing care.      Final diagnoses:  Renal cyst  RLQ abdominal pain    No signs of systemic illness or infection. The patient is nontoxic-appearing on exam and vital signs are within normal limits. I have reviewed the triage vital signs and the nursing notes. Pertinent labs & imaging results that were available during my care of the patient were reviewed by me and considered in my medical decision making (see chart for details). After history, exam, and medical workup I feel the patient has been appropriately medically screened and is safe for discharge home. Pertinent diagnoses were discussed with the patient. Patient was given return precautions.  ED Discharge Orders          Ordered    lidocaine  (LIDODERM ) 5 %  Every 24 hours        06/10/24 0610               Ala Kratz, MD 06/10/24 [phone]

## 2024-06-10 NOTE — ED Triage Notes (Signed)
 Right side pain started in mid August but worse tonignt. Tylenol  given at home.

## 2024-06-19 DIAGNOSIS — H52223 Regular astigmatism, bilateral: Secondary | ICD-10-CM | POA: Diagnosis not present

## 2024-06-19 DIAGNOSIS — H25813 Combined forms of age-related cataract, bilateral: Secondary | ICD-10-CM | POA: Diagnosis not present

## 2024-06-19 DIAGNOSIS — H5213 Myopia, bilateral: Secondary | ICD-10-CM | POA: Diagnosis not present

## 2024-06-19 DIAGNOSIS — E119 Type 2 diabetes mellitus without complications: Secondary | ICD-10-CM | POA: Diagnosis not present

## 2024-06-19 DIAGNOSIS — H524 Presbyopia: Secondary | ICD-10-CM | POA: Diagnosis not present

## 2024-06-19 LAB — HM DIABETES EYE EXAM

## 2024-07-14 DIAGNOSIS — R4701 Aphasia: Secondary | ICD-10-CM | POA: Diagnosis not present

## 2024-07-14 DIAGNOSIS — Z8673 Personal history of transient ischemic attack (TIA), and cerebral infarction without residual deficits: Secondary | ICD-10-CM | POA: Diagnosis not present

## 2024-07-14 DIAGNOSIS — I69320 Aphasia following cerebral infarction: Secondary | ICD-10-CM | POA: Diagnosis not present

## 2024-08-15 ENCOUNTER — Other Ambulatory Visit: Payer: Self-pay | Admitting: Family Medicine

## 2024-08-15 NOTE — Telephone Encounter (Signed)
 Needs appt

## 2024-08-17 ENCOUNTER — Telehealth: Payer: Self-pay | Admitting: Family Medicine

## 2024-08-17 ENCOUNTER — Other Ambulatory Visit: Payer: Self-pay | Admitting: Family Medicine

## 2024-08-17 MED ORDER — GLIMEPIRIDE 1 MG PO TABS: 1.0000 mg | ORAL_TABLET | Freq: Every day | ORAL | 0 refills | Status: DC

## 2024-08-24 NOTE — Telephone Encounter (Signed)
 error

## 2024-09-14 ENCOUNTER — Other Ambulatory Visit: Payer: Self-pay | Admitting: Family Medicine

## 2024-09-14 DIAGNOSIS — E119 Type 2 diabetes mellitus without complications: Secondary | ICD-10-CM

## 2024-09-17 NOTE — Progress Notes (Signed)
 Danielle Payne                                          MRN: [id number]   09/17/2024   The [facility] Quality Team Specialist reviewed this patient medical record for the purposes of chart review for care gap closure. The following were reviewed: chart review for care gap closure-kidney health evaluation for diabetes:eGFR  and uACR.    [facility]BCI Quality Team

## 2024-09-28 ENCOUNTER — Ambulatory Visit: Admitting: Family Medicine

## 2024-10-11 ENCOUNTER — Other Ambulatory Visit: Payer: Self-pay | Admitting: Family Medicine

## 2024-10-11 NOTE — Telephone Encounter (Signed)
 Needs appt

## 2024-10-15 NOTE — Telephone Encounter (Signed)
 LVM TO SCHE

## 2024-11-12 ENCOUNTER — Other Ambulatory Visit: Payer: Self-pay

## 2024-11-12 ENCOUNTER — Other Ambulatory Visit: Payer: Self-pay | Admitting: Family Medicine

## 2024-11-12 MED ORDER — GLIMEPIRIDE 1 MG PO TABS: 1.0000 mg | ORAL_TABLET | Freq: Every day | ORAL | 0 refills | Status: AC
